# Patient Record
Sex: Male | Born: 1955 | Race: White | Hispanic: No | Marital: Married | Smoking: Former smoker
Health system: Southern US, Community
[De-identification: ages and names within clinical notes are randomized; demographics above are authoritative.]

## PROBLEM LIST (undated history)

## (undated) DIAGNOSIS — I10 Essential (primary) hypertension: Secondary | ICD-10-CM

## (undated) DIAGNOSIS — K5792 Diverticulitis of intestine, part unspecified, without perforation or abscess without bleeding: Secondary | ICD-10-CM

## (undated) DIAGNOSIS — C801 Malignant (primary) neoplasm, unspecified: Secondary | ICD-10-CM

## (undated) DIAGNOSIS — E785 Hyperlipidemia, unspecified: Secondary | ICD-10-CM

## (undated) DIAGNOSIS — Z8619 Personal history of other infectious and parasitic diseases: Secondary | ICD-10-CM

## (undated) HISTORY — PX: HERNIA REPAIR: SHX51

## (undated) HISTORY — DX: Malignant (primary) neoplasm, unspecified: C80.1

## (undated) HISTORY — DX: Diverticulitis of intestine, part unspecified, without perforation or abscess without bleeding: K57.92

## (undated) HISTORY — DX: Hyperlipidemia, unspecified: E78.5

## (undated) HISTORY — DX: Essential (primary) hypertension: I10

## (undated) HISTORY — DX: Personal history of other infectious and parasitic diseases: Z86.19

---

## 1996-07-13 HISTORY — PX: SIGMOIDOSCOPY: SUR1295

## 2000-07-13 HISTORY — PX: VASECTOMY: SHX75

## 2002-07-13 DIAGNOSIS — I10 Essential (primary) hypertension: Secondary | ICD-10-CM | POA: Insufficient documentation

## 2005-03-03 ENCOUNTER — Other Ambulatory Visit: Payer: Self-pay

## 2005-03-04 ENCOUNTER — Other Ambulatory Visit: Payer: Self-pay

## 2005-03-04 ENCOUNTER — Observation Stay: Payer: Self-pay | Admitting: Internal Medicine

## 2011-07-14 DIAGNOSIS — C801 Malignant (primary) neoplasm, unspecified: Secondary | ICD-10-CM

## 2011-07-14 HISTORY — DX: Malignant (primary) neoplasm, unspecified: C80.1

## 2012-04-15 ENCOUNTER — Ambulatory Visit: Payer: Self-pay | Admitting: Family Medicine

## 2012-04-18 ENCOUNTER — Ambulatory Visit: Payer: Self-pay | Admitting: Family Medicine

## 2012-05-11 HISTORY — PX: TRANSURETHRAL RESECTION OF BLADDER TUMOR: SHX2575

## 2013-03-08 DIAGNOSIS — N401 Enlarged prostate with lower urinary tract symptoms: Secondary | ICD-10-CM | POA: Insufficient documentation

## 2014-01-15 LAB — LIPID PANEL
CHOLESTEROL: 212 mg/dL — AB (ref 0–200)
HDL: 36 mg/dL (ref 35–70)
LDL Cholesterol: 107 mg/dL
Triglycerides: 346 mg/dL — AB (ref 40–160)

## 2014-04-17 ENCOUNTER — Ambulatory Visit: Payer: Self-pay | Admitting: Family Medicine

## 2014-04-24 LAB — BASIC METABOLIC PANEL
BUN: 10 mg/dL (ref 4–21)
Creatinine: 1.2 mg/dL (ref 0.6–1.3)
Glucose: 96 mg/dL
Potassium: 4.6 mmol/L (ref 3.4–5.3)
Sodium: 136 mmol/L — AB (ref 137–147)

## 2014-04-24 LAB — HEPATIC FUNCTION PANEL
ALT: 42 U/L — AB (ref 10–40)
AST: 34 U/L (ref 14–40)

## 2014-04-30 ENCOUNTER — Encounter: Payer: Self-pay | Admitting: General Surgery

## 2014-04-30 ENCOUNTER — Ambulatory Visit (INDEPENDENT_AMBULATORY_CARE_PROVIDER_SITE_OTHER): Payer: 59 | Admitting: General Surgery

## 2014-04-30 VITALS — BP 130/72 | HR 76 | Resp 12 | Ht 70.0 in | Wt 189.0 lb

## 2014-04-30 DIAGNOSIS — K5732 Diverticulitis of large intestine without perforation or abscess without bleeding: Secondary | ICD-10-CM

## 2014-04-30 MED ORDER — AMOXICILLIN-POT CLAVULANATE 875-125 MG PO TABS: 1.0000 | ORAL_TABLET | Freq: Two times a day (BID) | ORAL | Status: DC

## 2014-04-30 NOTE — Progress Notes (Signed)
Patient ID: Jeffrey Fernandez, male   DOB: 1955-11-08, 58 y.o.   MRN: [id number]  Chief Complaint  Patient presents with  . Other    diverticulitis    HPI Jeffrey Fernandez is a 58 y.o. male here today for an evaluation of diverticulitis. He had a CT done on 04/17/14 and two courses of antibiotics. Patient noted pain in the right side of abdomen 2 weeks ago.He states he is still having abdominal pain. The pain has decreased since completion of antibiotics. He denies any fever, chills, diarrhea, or constipation since he has finished his antibiotics last week. He is on a limited diet at this time.   HPI  Past Medical History  Diagnosis Date  . Cancer 2013    bladder    Past Surgical History  Procedure Laterality Date  . Hernia repair  [phone]    Family History  Problem Relation Age of Onset  . COPD Mother   . Diverticulosis Mother   . Diverticulosis Father   . Diabetes Father   . Diverticulosis Sister     Social History History  Substance Use Topics  . Smoking status: Former Smoker -- 35 years  . Smokeless tobacco: Not on file  . Alcohol Use: Yes    Allergies  Allergen Reactions  . Atenolol     Other reaction(s): HIVES  . Other Nausea And Vomiting    Navy Beans  . Oxycodone     Other reaction(s): HIVES  . Shellfish Allergy Itching and Swelling    Current Outpatient Prescriptions  Medication Sig Dispense Refill  . amoxicillin-clavulanate (AUGMENTIN) 875-125 MG per tablet Take 1 tablet by mouth 2 (two) times daily.  20 tablet  0  . aspirin 81 MG tablet Take 81 mg by mouth daily.      . hyoscyamine (LEVSIN SL) 0.125 MG SL tablet Take 0.125 mg by mouth every 4 (four) hours as needed.       Marland Kitchen losartan-hydrochlorothiazide (HYZAAR) 50-12.5 MG per tablet Take 1 tablet by mouth daily.       . Probiotic Product (PROBIOTIC DAILY PO) Take 1 tablet by mouth daily.       No current facility-administered medications for this visit.    Review of Systems Review of Systems   Constitutional: Negative.   Respiratory: Negative.   Cardiovascular: Negative.   Gastrointestinal: Positive for abdominal pain.    Blood pressure 130/72, pulse 76, resp. rate 12, height 5\' 10"  (1.778 m), weight 189 lb (85.73 kg).  Physical Exam Physical Exam  Constitutional: He is oriented to person, place, and time. He appears well-developed and well-nourished.  Eyes: Conjunctivae are normal. No scleral icterus.  Neck: Neck supple. No thyromegaly present.  Cardiovascular: Normal rate, regular rhythm and normal heart sounds.   No murmur heard. Pulmonary/Chest: Effort normal and breath sounds normal.  Abdominal: Soft. Normal appearance and bowel sounds are normal. There is no hepatosplenomegaly. There is no tenderness. A hernia (small umbilical hernia noted. ) is present.    Lymphadenopathy:    He has no cervical adenopathy.  Neurological: He is alert and oriented to person, place, and time.  Skin: Skin is warm and dry.    Data Reviewed  CT scan reviewed showing moderate mid sigmoid diverticulitis. Located just to the right of midline.   Assessment    Sigmoid diverticulitis although better patient still having having focal pain.     Plan    Second course of antibiotics - Augmentin 875 mg bid for 10 days. Recheck  in two weeks.     Advised on all of the above. Low residue diet.   SANKAR,SEEPLAPUTHUR G 04/30/2014, 10:36 AM

## 2014-04-30 NOTE — Patient Instructions (Signed)
Diverticulitis °Diverticulitis is inflammation or infection of small pouches in your colon that form when you have a condition called diverticulosis. The pouches in your colon are called diverticula. Your colon, or large intestine, is where water is absorbed and stool is formed. °Complications of diverticulitis can include: °· Bleeding. °· Severe infection. °· Severe pain. °· Perforation of your colon. °· Obstruction of your colon. °CAUSES  °Diverticulitis is caused by bacteria. °Diverticulitis happens when stool becomes trapped in diverticula. This allows bacteria to grow in the diverticula, which can lead to inflammation and infection. °RISK FACTORS °People with diverticulosis are at risk for diverticulitis. Eating a diet that does not include enough fiber from fruits and vegetables may make diverticulitis more likely to develop. °SYMPTOMS  °Symptoms of diverticulitis may include: °· Abdominal pain and tenderness. The pain is normally located on the left side of the abdomen, but may occur in other areas. °· Fever and chills. °· Bloating. °· Cramping. °· Nausea. °· Vomiting. °· Constipation. °· Diarrhea. °· Blood in your stool. °DIAGNOSIS  °Your health care provider will ask you about your medical history and do a physical exam. You may need to have tests done because many medical conditions can cause the same symptoms as diverticulitis. Tests may include: °· Blood tests. °· Urine tests. °· Imaging tests of the abdomen, including X-rays and CT scans. °When your condition is under control, your health care provider may recommend that you have a colonoscopy. A colonoscopy can show how severe your diverticula are and whether something else is causing your symptoms. °TREATMENT  °Most cases of diverticulitis are mild and can be treated at home. Treatment may include: °· Taking over-the-counter pain medicines. °· Following a clear liquid diet. °· Taking antibiotic medicines by mouth for 7-10 days. °More severe cases may  be treated at a hospital. Treatment may include: °· Not eating or drinking. °· Taking prescription pain medicine. °· Receiving antibiotic medicines through an IV tube. °· Receiving fluids and nutrition through an IV tube. °· Surgery. °HOME CARE INSTRUCTIONS  °· Follow your health care provider's instructions carefully. °· Follow a full liquid diet or other diet as directed by your health care provider. After your symptoms improve, your health care provider may tell you to change your diet. He or she may recommend you eat a high-fiber diet. Fruits and vegetables are good sources of fiber. Fiber makes it easier to pass stool. °· Take fiber supplements or probiotics as directed by your health care provider. °· Only take medicines as directed by your health care provider. °· Keep all your follow-up appointments. °SEEK MEDICAL CARE IF:  °· Your pain does not improve. °· You have a hard time eating food. °· Your bowel movements do not return to normal. °SEEK IMMEDIATE MEDICAL CARE IF:  °· Your pain becomes worse. °· Your symptoms do not get better. °· Your symptoms suddenly get worse. °· You have a fever. °· You have repeated vomiting. °· You have bloody or black, tarry stools. °MAKE SURE YOU:  °· Understand these instructions. °· Will watch your condition. °· Will get help right away if you are not doing well or get worse. °Document Released: 04/08/2005 Document Revised: 07/04/2013 Document Reviewed: 05/24/2013 °ExitCare® Patient Information ©2015 [employer]. This information is not intended to replace advice given to you by your health care provider. Make sure you discuss any questions you have with your health care provider. ° °Low-Fiber Diet °Fiber is found in fruits, vegetables, and whole grains. A low-fiber   diet restricts fibrous foods that are not digested in the small intestine. A diet containing about 10-15 grams of fiber per day is considered low fiber. Low-fiber diets may be used to: °· Promote healing and  rest the bowel during intestinal flare-ups. °· Prevent blockage of a partially obstructed or narrowed gastrointestinal tract. °· Reduce fecal weight and volume. °· Slow the movement of feces. °You may be on a low-fiber diet as a transitional diet following surgery, after an injury (trauma), or because of a short (acute) or lifelong (chronic) illness. Your health care provider will determine the length of time you need to stay on this diet.  °WHAT DO I NEED TO KNOW ABOUT A LOW-FIBER DIET? °Always check the fiber content on the packaging's Nutrition Facts label, especially on foods from the grains list. Ask your dietitian if you have questions about specific foods that are related to your condition, especially if the food is not listed below. In general, a low-fiber food will have less than 2 g of fiber. °WHAT FOODS CAN I EAT? °Grains °All breads and crackers made with white flour. Sweet rolls, doughnuts, waffles, pancakes, French toast, bagels. Pretzels, Melba toast, zwieback. Well-cooked cereals, such as cornmeal, farina, or cream cereals. Dry cereals that do not contain whole grains, fruit, or nuts, such as refined corn, wheat, rice, and oat cereals. Potatoes prepared any way without skins, plain pastas and noodles, refined white rice. Use white flour for baking and making sauces. Use allowed list of grains for casseroles, dumplings, and puddings.  °Vegetables °Strained tomato and vegetable juices. Fresh lettuce, cucumber, spinach. Well-cooked (no skin or pulp) or canned vegetables, such as asparagus, bean sprouts, beets, carrots, green beans, mushrooms, potatoes, pumpkin, spinach, yellow squash, tomato sauce/puree, turnips, yams, and zucchini. Keep servings limited to ½ cup.  °Fruits °All fruit juices except prune juice. Cooked or canned fruits without skin and seeds, such as applesauce, apricots, cherries, fruit cocktail, grapefruit, grapes, mandarin oranges, melons, peaches, pears, pineapple, and plums. Fresh  fruits without skin, such as apricots, avocados, bananas, melons, pineapple, nectarines, and peaches. Keep servings limited to ½ cup or 1 piece.   °Meat and Other Protein Sources °Ground or well-cooked tender beef, ham, veal, lamb, pork, or poultry. Eggs, plain cheese. Fish, oysters, shrimp, lobster, and other seafood. Liver, organ meats. Smooth nut butters. °Dairy °All milk products and alternative dairy substitutes, such as soy, rice, almond, and coconut, not containing added whole nuts, seeds, or added fruit. °Beverages °Decaf coffee, fruit, and vegetable juices or smoothies (small amounts, with no pulp or skins, and with fruits from allowed list), sports drinks, herbal tea. °Condiments °Ketchup, mustard, vinegar, cream sauce, cheese sauce, cocoa powder. Spices in moderation, such as allspice, basil, bay leaves, celery powder or leaves, cinnamon, cumin powder, curry powder, ginger, mace, marjoram, onion or garlic powder, oregano, paprika, parsley flakes, ground pepper, rosemary, sage, savory, tarragon, thyme, and turmeric. °Sweets and Desserts °Plain cakes and cookies, pie made with allowed fruit, pudding, custard, cream pie. Gelatin, fruit, ice, sherbet, frozen ice pops. Ice cream, ice milk without nuts. Plain hard candy, honey, jelly, molasses, syrup, sugar, chocolate syrup, gumdrops, marshmallows. Limit overall sugar intake.  °Fats and Oil °Margarine, butter, cream, mayonnaise, salad oils, plain salad dressings made from allowed foods. Choose healthy fats such as olive oil, canola oil, and omega-3 fatty acids (such as found in salmon or tuna) when possible.  °Other °Bouillon, broth, or cream soups made from allowed foods. Any strained soup. Casseroles or mixed dishes made with   allowed foods. °The items listed above may not be a complete list of recommended foods or beverages. Contact your dietitian for more options.  °WHAT FOODS ARE NOT RECOMMENDED? °Grains °All whole wheat and whole grain breads and crackers.  Multigrains, rye, bran seeds, nuts, or coconut. Cereals containing whole grains, multigrains, bran, coconut, nuts, raisins. Cooked or dry oatmeal, steel-cut oats. Coarse wheat cereals, granola. Cereals advertised as high fiber. Potato skins. Whole grain pasta, wild or brown rice. Popcorn. Coconut flour. Bran, buckwheat, corn bread, multigrains, rye, wheat germ.  °Vegetables °Fresh, cooked or canned vegetables, such as artichokes, asparagus, beet greens, broccoli, Brussels sprouts, cabbage, celery, cauliflower, corn, eggplant, kale, legumes or beans, okra, peas, and tomatoes. Avoid large servings of any vegetables, especially raw vegetables.  °Fruits °Fresh fruits, such as apples with or without skin, berries, cherries, figs, grapes, grapefruit, guavas, kiwis, mangoes, oranges, papayas, pears, persimmons, pineapple, and pomegranate. Prune juice and juices with pulp, stewed or dried prunes. Dried fruits, dates, raisins. Fruit seeds or skins. Avoid large servings of all fresh fruits. °Meats and Other Protein Sources °Tough, fibrous meats with gristle. Chunky nut butter. Cheese made with seeds, nuts, or other foods not recommended. Nuts, seeds, legumes (beans, including baked beans), dried peas, beans, lentils.  °Dairy °Yogurt or cheese that contains nuts, seeds, or added fruit.  °Beverages °Fruit juices with high pulp, prune juice. Caffeinated coffee and teas.  °Condiments °Coconut, maple syrup, pickles, olives. °Sweets and Desserts °Desserts, cookies, or candies that contain nuts or coconut, chunky peanut butter, dried fruits. Jams, preserves with seeds, marmalade. Large amounts of sugar and sweets. Any other dessert made with fruits from the not recommended list.  °Other °Soups made from vegetables that are not recommended or that contain other foods not recommended.  °The items listed above may not be a complete list of foods and beverages to avoid. Contact your dietitian for more information. °Document Released:  12/19/2001 Document Revised: 07/04/2013 Document Reviewed: 05/22/2013 °ExitCare® Patient Information ©2015 [employer]. This information is not intended to replace advice given to you by your health care provider. Make sure you discuss any questions you have with your health care provider. ° °

## 2014-05-02 ENCOUNTER — Ambulatory Visit: Payer: Self-pay | Admitting: General Surgery

## 2014-05-15 ENCOUNTER — Other Ambulatory Visit: Payer: Self-pay

## 2014-05-15 ENCOUNTER — Encounter: Payer: Self-pay | Admitting: General Surgery

## 2014-05-15 ENCOUNTER — Ambulatory Visit (INDEPENDENT_AMBULATORY_CARE_PROVIDER_SITE_OTHER): Payer: BC Managed Care – PPO | Admitting: General Surgery

## 2014-05-15 VITALS — BP 120/70 | HR 74 | Resp 14 | Ht 70.0 in | Wt 191.0 lb

## 2014-05-15 DIAGNOSIS — K5732 Diverticulitis of large intestine without perforation or abscess without bleeding: Secondary | ICD-10-CM

## 2014-05-15 MED ORDER — POLYETHYLENE GLYCOL 3350 17 GM/SCOOP PO POWD: 1.0000 | Freq: Once | ORAL | Status: DC

## 2014-05-15 NOTE — Progress Notes (Signed)
Patient ID: Jeffrey Fernandez, male   DOB: [birth year], 58 y.o.   MRN: [id number]  Chief Complaint  Patient presents with  . Follow-up    2 week follow up sigmoid diverticulitis    HPI Jeffrey Fernandez is a 58 y.o. male who presents for a 2 week follow up sigmoid diverticulitis. He states he had blood in his stool on 05/06/14. He is unsure if this is due to hemorrhoids. He has not seen blood since this last episode. He states that he had an episode of of abdominal pain after pushing his umbilical hernia back in.   HPI  Past Medical History  Diagnosis Date  . Cancer 2013    bladder    Past Surgical History  Procedure Laterality Date  . Hernia repair  [phone]    Family History  Problem Relation Age of Onset  . COPD Mother   . Diverticulosis Mother   . Diverticulosis Father   . Diabetes Father   . Diverticulosis Sister     Social History History  Substance Use Topics  . Smoking status: Former Smoker -- 35 years  . Smokeless tobacco: Not on file  . Alcohol Use: 0.0 oz/week    0 Not specified per week    Allergies  Allergen Reactions  . Atenolol     Other reaction(s): HIVES  . Other Nausea And Vomiting    Navy Beans  . Oxycodone     Other reaction(s): HIVES  . Shellfish Allergy Itching and Swelling    Current Outpatient Prescriptions  Medication Sig Dispense Refill  . aspirin 81 MG tablet Take 81 mg by mouth daily.    . hyoscyamine (LEVSIN SL) 0.125 MG SL tablet Take 0.125 mg by mouth every 4 (four) hours as needed.     Marland Kitchen losartan-hydrochlorothiazide (HYZAAR) 50-12.5 MG per tablet Take 1 tablet by mouth daily.     . Probiotic Product (PROBIOTIC DAILY PO) Take 1 tablet by mouth daily.     No current facility-administered medications for this visit.    Review of Systems Review of Systems  Constitutional: Negative.   Respiratory: Negative.   Cardiovascular: Negative.   Gastrointestinal: Negative.     Blood pressure 120/70, pulse 74, resp. rate 14, height 5'  10" (1.778 m), weight 191 lb (86.637 kg).  Physical Exam Physical Exam  Constitutional: He is oriented to person, place, and time. He appears well-developed and well-nourished.  Neck: Neck supple. No thyromegaly present.  Cardiovascular: Normal rate, regular rhythm and normal heart sounds.   No murmur heard. Pulmonary/Chest: Effort normal and breath sounds normal.  Abdominal: Soft. Normal appearance and bowel sounds are normal. There is no hepatosplenomegaly. There is tenderness (mild tenderness that is markedly improved since last visit. ) in the right lower quadrant. A hernia (1cm umbilical) is present.  Neurological: He is alert and oriented to person, place, and time.  Skin: Skin is warm and dry.    Data Reviewed    Assessment    Sigmoid diverticulitis, much improved. Pt has not had screening colonoscopy.   Pt advised fully on this and he is agreeable.    Plan    Patient to be scheduled for colonoscopy in 3 weeks.        Leslieann Whisman G 05/15/2014, 10:52 AM

## 2014-05-15 NOTE — Progress Notes (Signed)
The patient has been scheduled for a Colonoscopy at [facility] on 06/20/14. He is aware to pre register with the hospital at least 2 days prior. He will only take his Blood Pressure medication at 6 am with a small sip of water the morning of.

## 2014-05-15 NOTE — Patient Instructions (Signed)
The patient is aware to call back for any questions or concerns. Colonoscopy A colonoscopy is an exam to look at the entire large intestine (colon). This exam can help find problems such as tumors, polyps, inflammation, and areas of bleeding. The exam takes about 1 hour.  LET YOUR HEALTH CARE PROVIDER KNOW ABOUT:   Any allergies you have.  All medicines you are taking, including vitamins, herbs, eye drops, creams, and over-the-counter medicines.  Previous problems you or members of your family have had with the use of anesthetics.  Any blood disorders you have.  Previous surgeries you have had.  Medical conditions you have. RISKS AND COMPLICATIONS  Generally, this is a safe procedure. However, as with any procedure, complications can occur. Possible complications include:  Bleeding.  Tearing or rupture of the colon wall.  Reaction to medicines given during the exam.  Infection (rare). BEFORE THE PROCEDURE   Ask your health care provider about changing or stopping your regular medicines.  You may be prescribed an oral bowel prep. This involves drinking a large amount of medicated liquid, starting the day before your procedure. The liquid will cause you to have multiple loose stools until your stool is almost clear or light green. This cleans out your colon in preparation for the procedure.  Do not eat or drink anything else once you have started the bowel prep, unless your health care provider tells you it is safe to do so.  Arrange for someone to drive you home after the procedure. PROCEDURE   You will be given medicine to help you relax (sedative).  You will lie on your side with your knees bent.  A long, flexible tube with a light and camera on the end (colonoscope) will be inserted through the rectum and into the colon. The camera sends video back to a computer screen as it moves through the colon. The colonoscope also releases carbon dioxide gas to inflate the colon. This  helps your health care provider see the area better.  During the exam, your health care provider may take a small tissue sample (biopsy) to be examined under a microscope if any abnormalities are found.  The exam is finished when the entire colon has been viewed. AFTER THE PROCEDURE   Do not drive for 24 hours after the exam.  You may have a small amount of blood in your stool.  You may pass moderate amounts of gas and have mild abdominal cramping or bloating. This is caused by the gas used to inflate your colon during the exam.  Ask when your test results will be ready and how you will get your results. Make sure you get your test results. Document Released: 06/26/2000 Document Revised: 04/19/2013 Document Reviewed: 03/06/2013 ExitCare Patient Information 2015 [employer]. This information is not intended to replace advice given to you by your health care provider. Make sure you discuss any questions you have with your health care provider.  

## 2014-06-11 ENCOUNTER — Other Ambulatory Visit: Payer: Self-pay | Admitting: General Surgery

## 2014-06-11 DIAGNOSIS — Z1211 Encounter for screening for malignant neoplasm of colon: Secondary | ICD-10-CM

## 2014-06-11 DIAGNOSIS — K5732 Diverticulitis of large intestine without perforation or abscess without bleeding: Secondary | ICD-10-CM

## 2014-06-20 ENCOUNTER — Encounter: Payer: Self-pay | Admitting: General Surgery

## 2014-06-20 ENCOUNTER — Ambulatory Visit: Payer: Self-pay | Admitting: General Surgery

## 2014-06-20 DIAGNOSIS — Z1211 Encounter for screening for malignant neoplasm of colon: Secondary | ICD-10-CM

## 2014-06-20 DIAGNOSIS — K644 Residual hemorrhoidal skin tags: Secondary | ICD-10-CM

## 2014-06-20 DIAGNOSIS — K5730 Diverticulosis of large intestine without perforation or abscess without bleeding: Secondary | ICD-10-CM

## 2014-06-20 DIAGNOSIS — D125 Benign neoplasm of sigmoid colon: Secondary | ICD-10-CM

## 2014-06-20 LAB — HM COLONOSCOPY

## 2014-06-21 ENCOUNTER — Encounter: Payer: Self-pay | Admitting: General Surgery

## 2014-06-25 ENCOUNTER — Telehealth: Payer: Self-pay | Admitting: *Deleted

## 2014-06-25 NOTE — Telephone Encounter (Signed)
-----   Message from Christene Lye, MD sent at 06/22/2014  8:17 AM EST ----- Please let pt pt know the pathology was normal. athe polyp was not a true polyp. He does not need colonoscopy for 50yrs. He is to call if he he has any recurrence of abdominal pain.

## 2014-06-25 NOTE — Telephone Encounter (Signed)
Error

## 2014-06-25 NOTE — Telephone Encounter (Signed)
Notified patient as instructed, patient pleased. Discussed follow-up appointments, patient agrees  

## 2014-11-05 LAB — SURGICAL PATHOLOGY

## 2015-04-30 ENCOUNTER — Other Ambulatory Visit: Payer: Self-pay | Admitting: Family Medicine

## 2015-11-06 ENCOUNTER — Other Ambulatory Visit: Payer: Self-pay | Admitting: Family Medicine

## 2015-12-26 ENCOUNTER — Other Ambulatory Visit: Payer: Self-pay | Admitting: Family Medicine

## 2016-01-23 ENCOUNTER — Other Ambulatory Visit: Payer: Self-pay | Admitting: Family Medicine

## 2016-02-20 ENCOUNTER — Other Ambulatory Visit: Payer: Self-pay | Admitting: Family Medicine

## 2016-03-19 DIAGNOSIS — Z8551 Personal history of malignant neoplasm of bladder: Secondary | ICD-10-CM | POA: Insufficient documentation

## 2016-03-19 DIAGNOSIS — E785 Hyperlipidemia, unspecified: Secondary | ICD-10-CM | POA: Insufficient documentation

## 2016-03-19 DIAGNOSIS — K5732 Diverticulitis of large intestine without perforation or abscess without bleeding: Secondary | ICD-10-CM | POA: Insufficient documentation

## 2016-03-19 DIAGNOSIS — M7120 Synovial cyst of popliteal space [Baker], unspecified knee: Secondary | ICD-10-CM | POA: Insufficient documentation

## 2016-03-20 ENCOUNTER — Encounter: Payer: Self-pay | Admitting: Family Medicine

## 2016-03-20 ENCOUNTER — Ambulatory Visit (INDEPENDENT_AMBULATORY_CARE_PROVIDER_SITE_OTHER): Payer: BLUE CROSS/BLUE SHIELD | Admitting: Family Medicine

## 2016-03-20 VITALS — BP 110/68 | HR 65 | Temp 97.9°F | Resp 16 | Ht 70.0 in | Wt 194.0 lb

## 2016-03-20 DIAGNOSIS — I10 Essential (primary) hypertension: Secondary | ICD-10-CM

## 2016-03-20 DIAGNOSIS — Z87891 Personal history of nicotine dependence: Secondary | ICD-10-CM

## 2016-03-20 DIAGNOSIS — Z125 Encounter for screening for malignant neoplasm of prostate: Secondary | ICD-10-CM

## 2016-03-20 NOTE — Patient Instructions (Signed)
Screening for lung cancer is recommended for people between 55 and 58 years of age who have smoked at 1 pack per day or more for at least 30 years. Please call our office at [phone] to schedule a low dose CT lung scan for lung cancer screening.   

## 2016-03-20 NOTE — Progress Notes (Signed)
Patient: Jeffrey Fernandez Male    DOB: [birth year]   60 y.o.   MRN: [id number] Visit Date: 03/20/2016  Today's Provider: Lelon Huh, MD   Chief Complaint  Patient presents with  . Follow-up  . Hypertension   Subjective:    HPI     Hypertension, follow-up:  BP Readings from Last 3 Encounters:  03/20/16 110/68  05/15/14 120/70  04/30/14 130/72    He was last seen for hypertension 01/15/2014.  BP at that visit was 104/66. Management since that visit includes; no changes, continue current medications.Marland KitchenHe reports good compliance with treatment. He is not having side effects. none He is exercising. He is adherent to low salt diet.   Outside blood pressures are normal. He is experiencing none.  Patient denies none.   Cardiovascular risk factors include none.  Use of agents associated with hypertension: none.   ----------------------------------------------------------------    Allergies  Allergen Reactions  . Atenolol Diarrhea    Other reaction(s): HIVES  . Other Nausea And Vomiting    Navy Beans  . Oxycodone     Other reaction(s): HIVES  . Shellfish Allergy Itching and Swelling     Current Outpatient Prescriptions:  .  aspirin 81 MG tablet, Take 81 mg by mouth daily., Disp: , Rfl:  .  losartan-hydrochlorothiazide (HYZAAR) 50-12.5 MG tablet, TAKE 1 TABLET BY MOUTH DAILY. NEED APPOINTMENT FOR REFILLS, Disp: 20 tablet, Rfl: 0  Review of Systems  Constitutional: Negative for appetite change, chills and fever.  Respiratory: Negative for chest tightness, shortness of breath and wheezing.   Cardiovascular: Negative for chest pain and palpitations.  Gastrointestinal: Negative for abdominal pain, nausea and vomiting.    Social History  Substance Use Topics  . Smoking status: Former Smoker    Years: 35.00    Quit date: 07/14/2011  . Smokeless tobacco: Not on file  . Alcohol use 0.0 oz/week     Comment: drinks 2-3 beers daily   Objective:   BP 110/68 (BP  Location: Right Arm, Patient Position: Sitting, Cuff Size: Normal)   Pulse 65   Temp 97.9 F (36.6 C) (Oral)   Resp 16   Ht 5\' 10"  (1.778 m)   Wt 194 lb (88 kg)   SpO2 97%   BMI 27.84 kg/m    Depression screen [facility] 2/9 03/20/2016  Decreased Interest 0  Down, Depressed, Hopeless 0  PHQ - 2 Score 0  Altered sleeping 1  Tired, decreased energy 1  Change in appetite 0  Feeling bad or failure about yourself  0  Trouble concentrating 0  Moving slowly or fidgety/restless 0  Suicidal thoughts 0  PHQ-9 Score 2  Difficult doing work/chores Not difficult at all     Physical Exam   General Appearance:    Alert, cooperative, no distress  Eyes:    PERRL, conjunctiva/corneas clear, EOM's intact       Lungs:     Clear to auscultation bilaterally, respirations unlabored  Heart:    Regular rate and rhythm  Neurologic:   Awake, alert, oriented x 3. No apparent focal neurological           defect.            Assessment & Plan:     1. Essential (primary) hypertension Well controlled.  Continue current medications.   - Lipid panel - Comprehensive metabolic panel  2. Prostate cancer screening  - PSA  3. Stopped smoking with greater than 30 pack year history  Recommended LDCT. He will think about it and call back if he decides to proceed.        Lelon Huh, MD  [facility]

## 2016-03-21 LAB — LIPID PANEL
CHOLESTEROL TOTAL: 177 mg/dL (ref 100–199)
Chol/HDL Ratio: 4.3 ratio units (ref 0.0–5.0)
HDL: 41 mg/dL (ref >39)
LDL Calculated: 119 mg/dL — ABNORMAL HIGH (ref 0–99)
TRIGLYCERIDES: 86 mg/dL (ref 0–149)
VLDL Cholesterol Cal: 17 mg/dL (ref 5–40)

## 2016-03-21 LAB — COMPREHENSIVE METABOLIC PANEL
A/G RATIO: 1.6 (ref 1.2–2.2)
ALT: 35 IU/L (ref 0–44)
AST: 28 IU/L (ref 0–40)
Albumin: 4.5 g/dL (ref 3.5–5.5)
Alkaline Phosphatase: 98 IU/L (ref 39–117)
BUN/Creatinine Ratio: 13 (ref 9–20)
BUN: 16 mg/dL (ref 6–24)
Bilirubin Total: 0.8 mg/dL (ref 0.0–1.2)
CALCIUM: 10.1 mg/dL (ref 8.7–10.2)
CO2: 25 mmol/L (ref 18–29)
Chloride: 99 mmol/L (ref 96–106)
Creatinine, Ser: 1.19 mg/dL (ref 0.76–1.27)
GFR, EST AFRICAN AMERICAN: 77 mL/min/{1.73_m2} (ref >59)
GFR, EST NON AFRICAN AMERICAN: 67 mL/min/{1.73_m2} (ref >59)
GLOBULIN, TOTAL: 2.9 g/dL (ref 1.5–4.5)
Glucose: 93 mg/dL (ref 65–99)
POTASSIUM: 4.9 mmol/L (ref 3.5–5.2)
SODIUM: 138 mmol/L (ref 134–144)
TOTAL PROTEIN: 7.4 g/dL (ref 6.0–8.5)

## 2016-03-21 LAB — PSA: Prostate Specific Ag, Serum: 1 ng/mL (ref 0.0–4.0)

## 2016-03-25 ENCOUNTER — Other Ambulatory Visit: Payer: Self-pay | Admitting: Family Medicine

## 2016-07-01 ENCOUNTER — Ambulatory Visit (INDEPENDENT_AMBULATORY_CARE_PROVIDER_SITE_OTHER): Payer: BLUE CROSS/BLUE SHIELD | Admitting: Physician Assistant

## 2016-07-01 ENCOUNTER — Encounter: Payer: Self-pay | Admitting: Physician Assistant

## 2016-07-01 VITALS — BP 122/76 | HR 76 | Temp 98.6°F | Resp 16 | Wt 202.0 lb

## 2016-07-01 DIAGNOSIS — J019 Acute sinusitis, unspecified: Secondary | ICD-10-CM | POA: Diagnosis not present

## 2016-07-01 MED ORDER — AMOXICILLIN-POT CLAVULANATE 875-125 MG PO TABS: 1.0000 | ORAL_TABLET | Freq: Two times a day (BID) | ORAL | 0 refills | Status: AC

## 2016-07-01 MED ORDER — BENZONATATE 100 MG PO CAPS: 100.0000 mg | ORAL_CAPSULE | Freq: Three times a day (TID) | ORAL | 0 refills | Status: AC | PRN

## 2016-07-01 NOTE — Progress Notes (Signed)
[place]  Chief Complaint  Patient presents with  . URI    Started about two weeks ago.  . Sinusitis    Subjective:    Patient ID: Jeffrey Fernandez, male    DOB: 12-22-1955, 60 y.o.   MRN: [id number]  Upper Respiratory Infection: Jeffrey Fernandez is a 61 y.o. male with a past medical history significant for Hypertension, former smoking complaining of symptoms of a URI, possible sinusitis. Symptoms include congestion, cough and sore throat. Onset of symptoms was 2 weeks ago, unchanged since that time. He also c/o congestion, cough described as productive, nasal congestion and post nasal drip for the past 1 week .  He is drinking plenty of fluids. Evaluation to date: none. Treatment to date: antihistamines and decongestants. The treatment has provided minimal relief. Thought he was getting better, then got worse.  Review of Systems  Constitutional: Negative for activity change, appetite change, chills, diaphoresis, fatigue, fever and unexpected weight change.  HENT: Positive for congestion, nosebleeds, postnasal drip, rhinorrhea, sinus pain, sinus pressure and sore throat. Negative for ear discharge, ear pain, tinnitus and trouble swallowing.   Eyes: Positive for discharge and itching.  Respiratory: Positive for cough and shortness of breath. Negative for apnea, choking, chest tightness, wheezing and stridor.   Gastrointestinal: Negative.   Neurological: Positive for headaches. Negative for dizziness and light-headedness.       Objective:   BP 122/76 (BP Location: Left Arm, Patient Position: Sitting, Cuff Size: Normal)   Pulse 76   Temp 98.6 F (37 C) (Oral)   Resp 16   Wt 202 lb (91.6 kg)   BMI 28.98 kg/m   Patient Active Problem List   Diagnosis Date Noted  . Stopped smoking with greater than 30 pack year history 03/20/2016  . H/O primary malignant neoplasm of urinary bladder 03/19/2016  . Hyperlipidemia 03/19/2016  . Diverticulitis of  sigmoid colon 03/19/2016  . Synovial cyst of popliteal space 03/19/2016  . 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 Essential (primary) hypertension 07/13/2002    Outpatient Encounter Prescriptions as of 07/01/2016  Medication Sig  . aspirin 81 MG tablet Take 81 mg by mouth daily.  Marland Kitchen losartan-hydrochlorothiazide (HYZAAR) 50-12.5 MG tablet TAKE 1 TABLET BY MOUTH DAILY  . amoxicillin-clavulanate (AUGMENTIN) 875-125 MG tablet Take 1 tablet by mouth 2 (two) times daily.  . benzonatate (TESSALON) 100 MG capsule Take 1 capsule (100 mg total) by mouth 3 (three) times daily as needed for cough.   No facility-administered encounter medications on file as of 07/01/2016.     Allergies  Allergen Reactions  . Atenolol Diarrhea    Other reaction(s): HIVES  . Other Nausea And Vomiting    Navy Beans  . Oxycodone     Other reaction(s): HIVES  . Shellfish Allergy Itching and Swelling       Physical Exam  Constitutional: He is oriented to person, place, and time. He appears well-developed and well-nourished. No distress.  HENT:  Right Ear: External ear normal.  Left Ear: External ear normal.  Nose: Right sinus exhibits maxillary sinus tenderness and frontal sinus tenderness. Left sinus exhibits maxillary sinus tenderness and frontal sinus tenderness.  Mouth/Throat: Oropharynx is clear and moist and mucous membranes are normal. No oropharyngeal exudate, posterior oropharyngeal edema, posterior oropharyngeal erythema or tonsillar abscesses.  Eyes: Conjunctivae are normal.  Neck: Normal range of motion.  Cardiovascular: Normal rate and regular rhythm.   Pulmonary/Chest: Effort normal and breath sounds normal.  Lymphadenopathy:    He has no cervical adenopathy.  Neurological: He is alert and oriented to person, place, and time.  Skin: Skin is warm and dry. He is not diaphoretic.  Psychiatric: He has a normal mood and affect. His behavior is normal.       Assessment & Plan:    Problem List Items Addressed This Visit    None    Visit Diagnoses    Acute non-recurrent sinusitis, unspecified location    -  Primary   Relevant Medications   amoxicillin-clavulanate (AUGMENTIN) 875-125 MG tablet   benzonatate (TESSALON) 100 MG capsule      1. Acute non-recurrent sinusitis, unspecified location - amoxicillin-clavulanate (AUGMENTIN) 875-125 MG tablet; Take 1 tablet by mouth 2 (two) times daily.  Dispense: 20 tablet; Refill: 0 - benzonatate (TESSALON) 100 MG capsule; Take 1 capsule (100 mg total) by mouth 3 (three) times daily as needed for cough.  Dispense: 20 capsule; Refill: 0  Return if symptoms worsen or fail to improve.   Patient Instructions  Sinusitis, Adult Sinusitis is soreness and inflammation of your sinuses. Sinuses are hollow spaces in the bones around your face. They are located:  Around your eyes.  In the middle of your forehead.  Behind your nose.  In your cheekbones. Your sinuses and nasal passages are lined with a stringy fluid (mucus). Mucus normally drains out of your sinuses. When your nasal tissues get inflamed or swollen, the mucus can get trapped or blocked so air cannot flow through your sinuses. This lets bacteria, viruses, and funguses grow, and that leads to infection. Follow these instructions at home: Medicines  Take, use, or apply over-the-counter and prescription medicines only as told by your doctor. These may include nasal sprays.  If you were prescribed an antibiotic medicine, take it as told by your doctor. Do not stop taking the antibiotic even if you start to feel better. Hydrate and Humidify  Drink enough water to keep your pee (urine) clear or pale yellow.  Use a cool mist humidifier to keep the humidity level in your home above 50%.  Breathe in steam for 10-15 minutes, 3-4 times a day or as told by your doctor. You can do this in the bathroom while a hot shower is running.  Try not to spend time in cool or dry  air. Rest  Rest as much as possible.  Sleep with your head raised (elevated).  Make sure to get enough sleep each night. General instructions  Put a warm, moist washcloth on your face 3-4 times a day or as told by your doctor. This will help with discomfort.  Wash your hands often with soap and water. If there is no soap and water, use hand sanitizer.  Do not smoke. Avoid being around people who are smoking (secondhand smoke).  Keep all follow-up visits as told by your doctor. This is important. Contact a doctor if:  You have a fever.  Your symptoms get worse.  Your symptoms do not get better within 10 days. Get help right away if:  You have a very bad headache.  You cannot stop throwing up (vomiting).  You have pain or swelling around your face or eyes.  You have trouble seeing.  You feel confused.  Your neck is stiff.  You have trouble breathing. This information is not intended to replace advice given to you by your health care provider. Make sure you discuss any questions you have with your health care provider. Document Released: 12/16/2007  Document Revised: 02/23/2016 Document Reviewed: 04/24/2015 Elsevier Interactive Patient Education  [employer].     The entirety of the information documented in the History of Present Illness, Review of Systems and Physical Exam were personally obtained by me. Portions of this information were initially documented by Ashley Royalty, CMA and reviewed by me for thoroughness and accuracy.

## 2016-07-01 NOTE — Patient Instructions (Signed)

## 2017-03-03 ENCOUNTER — Encounter: Payer: Self-pay | Admitting: Family Medicine

## 2017-03-03 ENCOUNTER — Ambulatory Visit (INDEPENDENT_AMBULATORY_CARE_PROVIDER_SITE_OTHER): Payer: BLUE CROSS/BLUE SHIELD | Admitting: Family Medicine

## 2017-03-03 VITALS — BP 128/72 | HR 80 | Temp 98.2°F | Resp 16 | Wt 198.4 lb

## 2017-03-03 DIAGNOSIS — I10 Essential (primary) hypertension: Secondary | ICD-10-CM | POA: Diagnosis not present

## 2017-03-03 DIAGNOSIS — M79601 Pain in right arm: Secondary | ICD-10-CM

## 2017-03-03 DIAGNOSIS — R29898 Other symptoms and signs involving the musculoskeletal system: Secondary | ICD-10-CM

## 2017-03-03 DIAGNOSIS — G8929 Other chronic pain: Secondary | ICD-10-CM

## 2017-03-03 NOTE — Progress Notes (Signed)
Patient: Jeffrey Fernandez Male    DOB: 09/06/1955   61 y.o.   MRN: [id number] Visit Date: 03/03/2017  Today's Provider: Lelon Huh, MD   Chief Complaint  Patient presents with  . Hypertension    follow up. Pt needs a refill on Hyzaar   . Elbow Pain   Subjective:    Arm Pain   Incident onset: March-April 2018. 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 He states he felt his elbow pop at that time. The pain is present in the right elbow. The quality of the pain is described as shooting, aching and stabbing. The pain has been worsening since the incident. The symptoms are aggravated by movement and lifting. He has tried acetaminophen (and wrist brace) for the symptoms. The treatment provided no relief.   Was bothering him some even before injury in March but has been much worse.  Has been wearing brace around elbow. Has tried heat and ice without improvement. He states it has actually been getting worse the last couple of weeks and having trouble holding objects and pulling with his right arm which is interfering with work    Hypertension, follow-up:  BP Readings from Last 3 Encounters:  03/03/17 128/72  07/01/16 122/76  03/20/16 110/68    He was last seen for hypertension on 03/20/2016 BP at that visit was 110/68. Management changes since that visit include continue medication. He reports good compliance with treatment. He is not having side effects.  He is not exercising. He does work. He is adherent to low salt diet. He states he does not add salt to food. Outside blood pressures have not been checked recently. He is experiencing none.  Patient denies chest pain, chest pressure/discomfort, irregular heart beat and palpitations.   Cardiovascular risk factors include advanced age (older than 71 for men, 13 for women), hypertension and male gender.  Use of agents associated with hypertension: none.     Weight trend: stable Wt Readings from Last 3 Encounters:  03/03/17 198 lb  6.4 oz (90 kg)  07/01/16 202 lb (91.6 kg)  03/20/16 194 lb (88 kg)     ------------------------------------------------------------------------    Previous Medications   ASPIRIN 81 MG TABLET    Take 81 mg by mouth daily.   LOSARTAN-HYDROCHLOROTHIAZIDE (HYZAAR) 50-12.5 MG TABLET    TAKE 1 TABLET BY MOUTH DAILY    Review of Systems  Constitutional: Negative.   Respiratory: Negative.   Cardiovascular: Negative.   Musculoskeletal: Positive for arthralgias.    Social History  Substance Use Topics  . Smoking status: Former Smoker    Years: 35.00    Quit date: 07/14/2011  . Smokeless tobacco: Never Used  . Alcohol use 0.0 oz/week     Comment: drinks 2-3 beers daily   Objective:   BP 128/72 (BP Location: Right Arm, Patient Position: Sitting, Cuff Size: Normal)   Pulse 80   Temp 98.2 F (36.8 C) (Oral)   Resp 16   Wt 198 lb 6.4 oz (90 kg)   SpO2 96%   BMI 28.47 kg/m   Physical Exam  General appearance: alert, well developed, well nourished, cooperative and in no distress Head: Normocephalic, without obvious abnormality, atraumatic Respiratory: Respirations even and unlabored, normal respiratory rate Extremities: Tender along volar aspect of right proximal  Forearm. Pain with elbow flexion, pronation and grip. +4 strength of right forearm. Swollen along length of forearm musculature.      Assessment & Plan:     1. Chronic pain of  right upper extremity  - Ambulatory referral to Orthopedic Surgery  2. Right arm weakness  - Ambulatory referral to Orthopedic Surgery  3. Essential (primary) hypertension Well controlled.  Continue current medications.    Follow up: No Follow-up on file.

## 2017-04-18 ENCOUNTER — Other Ambulatory Visit: Payer: Self-pay | Admitting: Family Medicine

## 2017-08-02 ENCOUNTER — Other Ambulatory Visit: Payer: Self-pay | Admitting: Family Medicine

## 2017-09-06 ENCOUNTER — Ambulatory Visit: Payer: 59 | Admitting: Family Medicine

## 2017-09-06 ENCOUNTER — Encounter: Payer: Self-pay | Admitting: Family Medicine

## 2017-09-06 VITALS — BP 102/64 | HR 87 | Temp 98.8°F | Resp 18 | Wt 201.0 lb

## 2017-09-06 DIAGNOSIS — R05 Cough: Secondary | ICD-10-CM

## 2017-09-06 DIAGNOSIS — J40 Bronchitis, not specified as acute or chronic: Secondary | ICD-10-CM | POA: Diagnosis not present

## 2017-09-06 DIAGNOSIS — R059 Cough, unspecified: Secondary | ICD-10-CM

## 2017-09-06 MED ORDER — LEVOFLOXACIN 500 MG PO TABS: 500.0000 mg | ORAL_TABLET | Freq: Every day | ORAL | 0 refills | Status: DC

## 2017-09-06 MED ORDER — LOSARTAN POTASSIUM-HCTZ 50-12.5 MG PO TABS: 1.0000 | ORAL_TABLET | Freq: Every day | ORAL | 0 refills | Status: DC

## 2017-09-06 MED ORDER — HYDROCODONE-HOMATROPINE 5-1.5 MG/5ML PO SYRP: 5.0000 mL | ORAL_SOLUTION | Freq: Three times a day (TID) | ORAL | 0 refills | Status: DC | PRN

## 2017-09-06 NOTE — Progress Notes (Signed)
Patient: Jeffrey Fernandez Male    DOB: 1955/12/13   62 y.o.   MRN: [id number] Visit Date: 09/06/2017  Today's Provider: Lelon Huh, MD   Chief Complaint  Patient presents with  . Cough    x 2 weeks   Subjective:    Cough  This is a new problem. Episode onset: 2 weeks ago. The problem has been gradually worsening. The cough is productive of sputum (green colored sputum). Associated symptoms include chest pain (when coughing), chills, a fever (low grade  100.1), headaches, heartburn, myalgias, postnasal drip, rhinorrhea, a sore throat, shortness of breath and wheezing. Pertinent negatives include no ear congestion or ear pain. The symptoms are aggravated by lying down. 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 The treatment provided no relief.       Allergies  Allergen Reactions  . Atenolol Diarrhea    Other reaction(s): HIVES  . Other Nausea And Vomiting    Navy Beans  . Oxycodone     Other reaction(s): HIVES  . Shellfish Allergy Itching and Swelling     Current Outpatient Medications:  .  aspirin 81 MG tablet, Take 81 mg by mouth daily., Disp: , Rfl:  .  losartan-hydrochlorothiazide (HYZAAR) 50-12.5 MG tablet, TAKE 1 TABLET BY MOUTH EVERY DAY, Disp: 90 tablet, Rfl: 0  Review of Systems  Constitutional: Positive for appetite change (decrease in appetite), chills, fatigue and fever (low grade  100.1).  HENT: Positive for postnasal drip, rhinorrhea and sore throat. Negative for ear pain.   Eyes: Positive for pain and discharge (this morning eyes matted shut).       Puffy eyes this morning  Respiratory: Positive for cough, shortness of breath and wheezing.   Cardiovascular: Positive for chest pain (when coughing).  Gastrointestinal: Positive for abdominal pain (during coughing spells) and heartburn.       Reflux  Musculoskeletal: Positive for myalgias.  Neurological: Positive for headaches.  Psychiatric/Behavioral: Positive for sleep disturbance (cough keeping patient from  sleeping).    Social History   Tobacco Use  . Smoking status: Former Smoker    Years: 35.00    Last attempt to quit: 07/14/2011    Years since quitting: 6.1  . Smokeless tobacco: Never Used  Substance Use Topics  . Alcohol use: Yes    Alcohol/week: 0.0 oz    Comment: drinks 2-3 beers daily   Objective:   BP 102/64 (BP Location: Left Arm, Patient Position: Sitting, Cuff Size: Large)   Pulse 87   Temp 98.8 F (37.1 C) (Oral)   Resp 18   Wt 201 lb (91.2 kg)   SpO2 96% Comment: room air  BMI 28.84 kg/m     Physical Exam  General Appearance:    Alert, cooperative, no distress  HENT:   bilateral TM normal without fluid or infection, neck without nodes, pharynx erythematous without exudate, sinuses nontender, post nasal drip noted and nasal mucosa congested  Eyes:    PERRL, conjunctiva/corneas clear, EOM's intact       Lungs:     Clear to auscultation bilaterally, respirations unlabored  Heart:    Regular rate and rhythm  Neurologic:   Awake, alert, oriented x 3. No apparent focal neurological           defect.           Assessment & Plan:     1. Bronchitis  - levofloxacin (LEVAQUIN) 500 MG tablet; Take 1 tablet (500 mg total) by mouth daily.  Dispense: 7  tablet; Refill: 0  2. Cough  - HYDROcodone-homatropine (HYCODAN) 5-1.5 MG/5ML syrup; Take 5 mLs by mouth every 8 (eight) hours as needed.  Dispense: 100 mL; Refill: 0  Call if symptoms change or if not rapidly improving.           Lelon Huh, MD  [facility]

## 2017-09-06 NOTE — Patient Instructions (Signed)
Bronchospasm, Adult Bronchospasm is a tightening of the airways going into the lungs. During an episode, it may be harder to breathe. You may cough, and you may make a whistling sound when you breathe (wheeze). This condition often affects people with asthma. What are the causes? This condition is caused by swelling and irritation in the airways. It can be triggered by:  An infection (common).  Seasonal allergies.  An allergic reaction.  Exercise.  Irritants. These include pollution, cigarette smoke, strong odors, aerosol sprays, and paint fumes.  Weather changes. Winds increase molds and pollens in the air. Cold air may cause swelling.  Stress and emotional upset.  What are the signs or symptoms? Symptoms of this condition include:  Wheezing. If the episode was triggered by an allergy, wheezing may start right away or hours later.  Nighttime coughing.  Frequent or severe coughing with a simple cold.  Chest tightness.  Shortness of breath.  Decreased ability to exercise.  How is this diagnosed? This condition is usually diagnosed with a review of your medical history and a physical exam. Tests, such as lung function tests, are sometimes done to look for other conditions. The need for a chest X-ray depends on where the wheezing occurs and whether it is the first time you have wheezed. How is this treated? This condition may be treated with:  Inhaled medicines. These open up the airways and help you breathe. They can be taken with an inhaler or a nebulizer device.  Corticosteroid medicines. These may be given for severe bronchospasm, usually when it is associated with asthma.  Avoiding triggers, such as irritants, infection, or allergies.  Follow these instructions at home: Medicines  Take over-the-counter and prescription medicines only as told by your health care provider.  If you need to use an inhaler or nebulizer to take your medicine, ask your health care  provider to explain how to use it correctly. If you were given a spacer, always use it with your inhaler. Lifestyle  Reduce the number of triggers in your home. To do this: ? Change your heating and air conditioning filter at least once a month. ? Limit your use of fireplaces and wood stoves. ? Do not smoke. Do not allow smoking in your home. ? Avoid using perfumes and fragrances. ? Get rid of pests, such as roaches and mice, and their droppings. ? Remove any mold from your home. ? Keep your house clean and dust free. Use unscented cleaning products. ? Replace carpet with wood, tile, or vinyl flooring. Carpet can trap dander and dust. ? Use allergy-proof pillows, mattress covers, and box spring covers. ? Wash bed sheets and blankets every week in hot water. Dry them in a dryer. ? Use blankets that are made of polyester or cotton. ? Wash your hands often. ? Do not allow pets in your bedroom.  Avoid breathing in cold air when you exercise. General instructions  Have a plan for seeking medical care. Know when to call your health care provider and local emergency services, and where to get emergency care.  Stay up to date on your immunizations.  When you have an episode of bronchospasm, stay calm. Try to relax and breathe more slowly.  If you have asthma, make sure you have an asthma action plan.  Keep all follow-up visits as told by your health care provider. This is important. Contact a health care provider if:  You have muscle aches.  You have chest pain.  The mucus that you   cough up (sputum) changes from clear or white to yellow, green, gray, or bloody.  You have a fever.  Your sputum gets thicker. Get help right away if:  Your wheezing and coughing get worse, even after you take your prescribed medicines.  It gets even harder to breathe.  You develop severe chest pain. Summary  Bronchospasm is a tightening of the airways going into the lungs.  During an episode of  bronchospasm, you may have a harder time breathing. You may cough and make a whistling sound when you breathe (wheeze).  Avoid exposure to triggers such as smoke, dust, mold, animal dander, and fragrances.  When you have an episode of bronchospasm, stay calm. Try to relax and breathe more slowly. This information is not intended to replace advice given to you by your health care provider. Make sure you discuss any questions you have with your health care provider. Document Released: 07/02/2003 Document Revised: 06/25/2016 Document Reviewed: 06/25/2016 Elsevier Interactive Patient Education  2017 [employer]  

## 2017-11-17 ENCOUNTER — Ambulatory Visit (INDEPENDENT_AMBULATORY_CARE_PROVIDER_SITE_OTHER): Payer: 59 | Admitting: Family Medicine

## 2017-11-17 ENCOUNTER — Encounter: Payer: Self-pay | Admitting: Family Medicine

## 2017-11-17 VITALS — BP 122/70 | HR 57 | Temp 98.6°F | Resp 16 | Ht 70.0 in | Wt 196.0 lb

## 2017-11-17 DIAGNOSIS — Z0000 Encounter for general adult medical examination without abnormal findings: Secondary | ICD-10-CM | POA: Diagnosis not present

## 2017-11-17 DIAGNOSIS — I10 Essential (primary) hypertension: Secondary | ICD-10-CM | POA: Diagnosis not present

## 2017-11-17 DIAGNOSIS — Z8551 Personal history of malignant neoplasm of bladder: Secondary | ICD-10-CM | POA: Diagnosis not present

## 2017-11-17 DIAGNOSIS — Z87891 Personal history of nicotine dependence: Secondary | ICD-10-CM

## 2017-11-17 DIAGNOSIS — Z23 Encounter for immunization: Secondary | ICD-10-CM | POA: Diagnosis not present

## 2017-11-17 DIAGNOSIS — Z125 Encounter for screening for malignant neoplasm of prostate: Secondary | ICD-10-CM | POA: Diagnosis not present

## 2017-11-17 DIAGNOSIS — K219 Gastro-esophageal reflux disease without esophagitis: Secondary | ICD-10-CM | POA: Diagnosis not present

## 2017-11-17 LAB — POCT URINALYSIS DIPSTICK
Bilirubin, UA: NEGATIVE
GLUCOSE UA: NEGATIVE
Nitrite, UA: NEGATIVE
Protein, UA: NEGATIVE
Spec Grav, UA: 1.01 (ref 1.010–1.025)
Urobilinogen, UA: 0.2 E.U./dL
pH, UA: 7 (ref 5.0–8.0)

## 2017-11-17 NOTE — Progress Notes (Signed)
Patient: Jeffrey Fernandez, Male    DOB: [birth year], 62 y.o.   MRN: [id number] Visit Date: 11/17/2017  Today's Provider: Lelon Huh, MD   Chief Complaint  Patient presents with  . Annual Exam  . Hypertension   Subjective:    Annual physical exam Jeffrey Fernandez is a 62 y.o. male who presents today for health maintenance and complete physical. He feels fairly well. He reports no regular exercising. He reports he is sleeping fairly well.  -----------------------------------------------------------------   Hypertension, follow-up:  BP Readings from Last 3 Encounters:  09/06/17 102/64  03/03/17 128/72  07/01/16 122/76    He was last seen for hypertension 9 months ago.  BP at that visit was 128/72. Management since that visit includes; no chnages.He reports good compliance with treatment. He is not having side effects.  He is not exercising. He is not adherent to low salt diet.   Outside blood pressures are not bing checked. He is experiencing none.  Patient denies chest pain, chest pressure/discomfort, claudication, dyspnea, exertional chest pressure/discomfort, fatigue, irregular heart beat, lower extremity edema, near-syncope, orthopnea, palpitations, paroxysmal nocturnal dyspnea, syncope and tachypnea.   Cardiovascular risk factors include advanced age (older than 56 for men, 42 for women), hypertension and male gender.  Use of agents associated with hypertension: NSAIDS.   ------------------------------------------------------------------------ His only complaint today is that he has heartburn every day. No trouble swallowing, no dysphagia. Has started taking garlic which seems to help, but is still having to take OTC antacids every day.    Review of Systems  Constitutional: Negative for appetite change, chills, fatigue and fever.  HENT: Negative for congestion, ear pain, hearing loss, nosebleeds and trouble swallowing.   Eyes: Negative for pain and visual  disturbance.  Respiratory: Negative for cough, chest tightness and shortness of breath.   Cardiovascular: Negative for chest pain, palpitations and leg swelling.  Gastrointestinal: Negative for abdominal pain, blood in stool, constipation, diarrhea, nausea and vomiting.  Endocrine: Negative for polydipsia, polyphagia and polyuria.  Genitourinary: Positive for decreased urine volume. Negative for dysuria and flank pain.  Musculoskeletal: Negative for arthralgias, back pain, joint swelling, myalgias and neck stiffness.  Skin: Negative for color change, rash and wound.  Neurological: Negative for dizziness, tremors, seizures, speech difficulty, weakness, light-headedness and headaches.  Psychiatric/Behavioral: Negative for behavioral problems, confusion, decreased concentration, dysphoric mood and sleep disturbance. The patient is not nervous/anxious.   All other systems reviewed and are negative.   Social History      He  reports that he quit smoking about 6 years ago. He quit after 35.00 years of use. He has never used smokeless tobacco. He reports that he drinks alcohol. He reports that he does not use drugs.       Social History   Socioeconomic History  . Marital status: Married    Spouse name: Not on file  . Number of children: 3  . Years of education: Not on file  . Highest education level: Not on file  Occupational History  . Occupation: [employer] operational officer  . Financial resource strain: Not on file  . Food insecurity:    Worry: Not on file    Inability: Not on file  . Transportation needs:    Medical: Not on file    Non-medical: Not on file  Tobacco Use  . Smoking status: Former Smoker    Years: 35.00    Last attempt to quit: 07/14/2011    Years since  quitting: 6.3  . Smokeless tobacco: Never Used  Substance and Sexual Activity  . Alcohol use: Yes    Alcohol/week: 0.0 oz    Comment: drinks 2-3 beers daily  . Drug use: No  . Sexual activity: Not on file    Lifestyle  . Physical activity:    Days per week: Not on file    Minutes per session: Not on file  . Stress: Not on file  Relationships  . Social connections:    Talks on phone: Not on file    Gets together: Not on file    Attends religious service: Not on file    Active member of club or organization: Not on file    Attends meetings of clubs or organizations: Not on file    Relationship status: Not on file  Other Topics Concern  . Not on file  Social History Narrative  . Not on file    Past Medical History:  Diagnosis Date  . Cancer [facility]) 2013   bladder  . Diverticulitis   . History of chickenpox   . History of measles   . History of mumps   . Hyperlipidemia   . Hypertension      Patient Active Problem List   Diagnosis Date Noted  . Right arm weakness 03/03/2017  . Stopped smoking with greater than 30 pack year history 03/20/2016  . H/O primary malignant neoplasm of urinary bladder 03/19/2016  . Hyperlipidemia 03/19/2016  . Diverticulitis of sigmoid colon 03/19/2016  . Synovial cyst of popliteal space 03/19/2016  . Benign localized hyperplasia of prostate with urinary obstruction and other lower urinary tract symptoms (LUTS)(600.21) 03/08/2013  . 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 Essential (primary) hypertension 07/13/2002    Past Surgical History:  Procedure Laterality Date  . HERNIA REPAIR  2002,2003  . SIGMOIDOSCOPY  1998   [facility]  . TRANSURETHRAL RESECTION OF BLADDER TUMOR  05/11/2012   Dr. Sherral Hammers, [facility]  . VASECTOMY  2002   Bilateral inguinal herniorrhaphies    Family History        Family Status  Relation Name Status  . Mother  Alive  . Father  Alive  . Sister  Alive  . Brother  Alive  . Daughter  Alive  . Son  Alive  . MGM  Deceased  . MGF  Deceased  . PGM  Deceased  . PGF  Alive  . Son  Alive        His family history includes COPD in his mother; Diabetes in his father; Diverticulosis in his  father, mother, and sister; Hypertension in his father; Stroke in his paternal grandmother.      Allergies  Allergen Reactions  . Atenolol Diarrhea    Other reaction(s): HIVES  . Other Nausea And Vomiting    Navy Beans  . Oxycodone     Other reaction(s): HIVES  . Shellfish Allergy Itching and Swelling     Current Outpatient Medications:  .  aspirin 81 MG tablet, Take 81 mg by mouth daily., Disp: , Rfl:  .  losartan-hydrochlorothiazide (HYZAAR) 50-12.5 MG tablet, Take 1 tablet by mouth daily., Disp: 90 tablet, Rfl: 0 .  levofloxacin (LEVAQUIN) 500 MG tablet, Take 1 tablet (500 mg total) by mouth daily. (Patient not taking: Reported on 11/17/2017), Disp: 7 tablet, Rfl: 0   Patient Care Team: Birdie Sons, MD as PCP - General (Family Medicine) Chrismon, Vickki Muff, PA (Physician Assistant) Christene Lye, MD (General Surgery)  System, Provider Not In Murrell Redden, MD (Urology)      Objective:   Vitals: BP 122/70 (BP Location: Left Arm, Patient Position: Sitting, Cuff Size: Large)   Pulse (!) 57   Temp 98.6 F (37 C) (Oral)   Resp 16   Ht 5\' 10"  (1.778 m)   Wt 196 lb (88.9 kg)   SpO2 97% Comment: room air  BMI 28.12 kg/m      Physical Exam   General Appearance:    Alert, cooperative, no distress, appears stated age  Head:    Normocephalic, without obvious abnormality, atraumatic  Eyes:    PERRL, conjunctiva/corneas clear, EOM's intact, fundi    benign, both eyes       Ears:    Normal TM's and external ear canals, both ears  Nose:   Nares normal, septum midline, mucosa normal, no drainage   or sinus tenderness  Throat:   Lips, mucosa, and tongue normal; teeth and gums normal  Neck:   Supple, symmetrical, trachea midline, no adenopathy;       thyroid:  No enlargement/tenderness/nodules; no carotid   bruit or JVD  Back:     Symmetric, no curvature, ROM normal, no CVA tenderness  Lungs:     Clear to auscultation bilaterally, respirations unlabored  Chest  wall:    No tenderness or deformity  Heart:    Regular rate and rhythm, S1 and S2 normal, no murmur, rub   or gallop  Abdomen:     Soft, non-tender, bowel sounds active all four quadrants,    no masses, no organomegaly  Genitalia:    deferred  Rectal:    deferred  Extremities:   Extremities normal, atraumatic, no cyanosis or edema  Pulses:   2+ and symmetric all extremities  Skin:   Skin color, texture, turgor normal, no rashes or lesions  Lymph nodes:   Cervical, supraclavicular, and axillary nodes normal  Neurologic:   CNII-XII intact. Normal strength, sensation and reflexes      throughout    Depression Screen PHQ 2/9 Scores 11/17/2017 03/20/2016  PHQ - 2 Score 0 0  PHQ- 9 Score 1 2      Assessment & Plan:     Routine Health Maintenance and Physical Exam  Exercise Activities and Dietary recommendations Goals    None      Immunization History  Administered Date(s) Administered  . Tdap 03/01/2007    Health Maintenance  Topic Date Due  . HIV Screening  08/30/1972  . TETANUS/TDAP  02/28/2017  . INFLUENZA VACCINE  03/03/2018 (Originally 02/10/2018)  . COLONOSCOPY  06/20/2024  . Hepatitis C Screening  Completed     Discussed health benefits of physical activity, and encouraged him to engage in regular exercise appropriate for his age and condition.    --------------------------------------------------------------------  1. Annual physical exam Generally doing well. Counseled regarding prudent diet and regular exercise. Counseled regarding Shingrix which he will consider.  - Comprehensive metabolic panel - Lipid panel  2. Essential (primary) hypertension Well controlled.  Continue current medications.   - EKG 12-Lead  3. Gastroesophageal reflux disease, esophagitis presence not specified Start daily omeprazole 20mg  - Comprehensive metabolic panel - Lipid panel  4. Prostate cancer screening  - PSA  5. Stopped smoking with greater than 30 pack year  history  - Ambulatory Referral for Lung Cancer Scre  6. Need for Td vaccine  - Td : Tetanus/diphtheria >7yo Preservative  free  7. H/O primary malignant neoplasm of urinary bladder  -  POCT Urinalysis Dipstick   Lelon Huh, MD  [facility]

## 2017-11-17 NOTE — Patient Instructions (Addendum)
   The CDC recommends two doses of Shingrix (the shingles vaccine) separated by 2 to 6 months for adults age 60 years and older. I recommend checking with your insurance plan regarding coverage for this vaccine.    Start taking omeprazole (Prilosec OTC) 20mg  once a day to control acid reflux

## 2017-11-18 ENCOUNTER — Telehealth: Payer: Self-pay | Admitting: *Deleted

## 2017-11-18 DIAGNOSIS — Z87891 Personal history of nicotine dependence: Secondary | ICD-10-CM

## 2017-11-18 DIAGNOSIS — Z122 Encounter for screening for malignant neoplasm of respiratory organs: Secondary | ICD-10-CM

## 2017-11-18 LAB — COMPREHENSIVE METABOLIC PANEL
ALBUMIN: 4.5 g/dL (ref 3.6–4.8)
ALK PHOS: 92 IU/L (ref 39–117)
ALT: 42 IU/L (ref 0–44)
AST: 27 IU/L (ref 0–40)
Albumin/Globulin Ratio: 1.6 (ref 1.2–2.2)
BILIRUBIN TOTAL: 0.9 mg/dL (ref 0.0–1.2)
BUN / CREAT RATIO: 11 (ref 10–24)
BUN: 11 mg/dL (ref 8–27)
CHLORIDE: 100 mmol/L (ref 96–106)
CO2: 24 mmol/L (ref 20–29)
CREATININE: 1.02 mg/dL (ref 0.76–1.27)
Calcium: 10 mg/dL (ref 8.6–10.2)
GFR calc Af Amer: 92 mL/min/{1.73_m2} (ref >59)
GFR calc non Af Amer: 80 mL/min/{1.73_m2} (ref >59)
GLOBULIN, TOTAL: 2.8 g/dL (ref 1.5–4.5)
Glucose: 80 mg/dL (ref 65–99)
Potassium: 4.1 mmol/L (ref 3.5–5.2)
SODIUM: 140 mmol/L (ref 134–144)
Total Protein: 7.3 g/dL (ref 6.0–8.5)

## 2017-11-18 LAB — LIPID PANEL
CHOLESTEROL TOTAL: 181 mg/dL (ref 100–199)
Chol/HDL Ratio: 4.5 ratio (ref 0.0–5.0)
HDL: 40 mg/dL (ref >39)
LDL CALC: 108 mg/dL — AB (ref 0–99)
TRIGLYCERIDES: 167 mg/dL — AB (ref 0–149)
VLDL Cholesterol Cal: 33 mg/dL (ref 5–40)

## 2017-11-18 LAB — PSA: Prostate Specific Ag, Serum: 1.3 ng/mL (ref 0.0–4.0)

## 2017-11-18 NOTE — Telephone Encounter (Signed)
Received referral for initial lung cancer screening scan. Contacted patient and obtained smoking history,(former, quit 2013, 78 pack year) as well as answering questions related to screening process. Patient denies signs of lung cancer such as weight loss or hemoptysis. Patient denies comorbidity that would prevent curative treatment if lung cancer were found. Patient is scheduled for shared decision making visit and CT scan on 12/08/17.

## 2017-12-01 ENCOUNTER — Other Ambulatory Visit: Payer: Self-pay | Admitting: Family Medicine

## 2017-12-01 MED ORDER — LOSARTAN POTASSIUM-HCTZ 50-12.5 MG PO TABS: 1.0000 | ORAL_TABLET | Freq: Every day | ORAL | 4 refills | Status: DC

## 2017-12-08 ENCOUNTER — Encounter: Payer: Self-pay | Admitting: Oncology

## 2017-12-08 ENCOUNTER — Inpatient Hospital Stay: Payer: 59 | Attending: Oncology | Admitting: Oncology

## 2017-12-08 ENCOUNTER — Ambulatory Visit
Admission: RE | Admit: 2017-12-08 | Discharge: 2017-12-08 | Disposition: A | Discharge status: Home / Self Care | Payer: 59 | Source: Ambulatory Visit | Attending: Oncology | Admitting: Oncology

## 2017-12-08 DIAGNOSIS — J439 Emphysema, unspecified: Secondary | ICD-10-CM | POA: Diagnosis not present

## 2017-12-08 DIAGNOSIS — I700 Atherosclerosis of aorta: Secondary | ICD-10-CM | POA: Insufficient documentation

## 2017-12-08 DIAGNOSIS — Z122 Encounter for screening for malignant neoplasm of respiratory organs: Secondary | ICD-10-CM | POA: Diagnosis not present

## 2017-12-08 DIAGNOSIS — Z87891 Personal history of nicotine dependence: Secondary | ICD-10-CM

## 2017-12-08 DIAGNOSIS — Z8551 Personal history of malignant neoplasm of bladder: Secondary | ICD-10-CM | POA: Insufficient documentation

## 2017-12-08 NOTE — Progress Notes (Signed)
In accordance with CMS guidelines, patient has met eligibility criteria including age, absence of signs or symptoms of lung cancer.  Social History   Tobacco Use  . Smoking status: Former Smoker    Packs/day: 2.00    Years: 39.00    Pack years: 78.00    Last attempt to quit: 07/14/2011    Years since quitting: 6.4  . Smokeless tobacco: Never Used  . Tobacco comment: smoked 1 to 1 1/2  ppd for 35 years  Substance Use Topics  . Alcohol use: Yes    Alcohol/week: 0.0 oz    Comment: drinks 2 beers daily  . Drug use: No     A shared decision-making session was conducted prior to the performance of CT scan. This includes one or more decision aids, includes benefits and harms of screening, follow-up diagnostic testing, over-diagnosis, false positive rate, and total radiation exposure.  Counseling on the importance of adherence to annual lung cancer LDCT screening, impact of co-morbidities, and ability or willingness to undergo diagnosis and treatment is imperative for compliance of the program.  Counseling on the importance of continued smoking cessation for former smokers; the importance of smoking cessation for current smokers, and information about tobacco cessation interventions have been given to patient including [place] and [facility] quit [place] programs.  Written order for lung cancer screening with LDCT has been given to the patient and any and all questions have been answered to the best of my abilities.   Yearly follow up will be coordinated by Burgess Estelle, Thoracic Navigator.  Faythe Casa, NP 12/08/2017 12:26 PM

## 2017-12-13 ENCOUNTER — Encounter: Payer: Self-pay | Admitting: Family Medicine

## 2017-12-13 ENCOUNTER — Telehealth: Payer: Self-pay | Admitting: *Deleted

## 2017-12-13 DIAGNOSIS — J439 Emphysema, unspecified: Secondary | ICD-10-CM | POA: Insufficient documentation

## 2017-12-13 DIAGNOSIS — I700 Atherosclerosis of aorta: Secondary | ICD-10-CM | POA: Insufficient documentation

## 2017-12-13 NOTE — Telephone Encounter (Signed)
Notified patient of LDCT lung cancer screening program results with recommendation for 12 month follow up imaging. Also notified of incidental findings noted below and is encouraged to discuss further with PCP who will receive a copy of this note and/or the CT report. Patient verbalizes understanding.   IMPRESSION: Lung-RADS 2, benign appearance or behavior. Continue annual screening with low-dose chest CT without contrast in 12 months.  Aortic Atherosclerosis (ICD10-I70.0) and Emphysema (ICD10-J43.9).  

## 2018-10-03 ENCOUNTER — Other Ambulatory Visit: Payer: Self-pay

## 2018-10-03 ENCOUNTER — Ambulatory Visit: Payer: BLUE CROSS/BLUE SHIELD | Admitting: Family Medicine

## 2018-10-03 ENCOUNTER — Encounter: Payer: Self-pay | Admitting: Family Medicine

## 2018-10-03 DIAGNOSIS — J301 Allergic rhinitis due to pollen: Secondary | ICD-10-CM

## 2018-10-03 DIAGNOSIS — R05 Cough: Secondary | ICD-10-CM

## 2018-10-03 DIAGNOSIS — R059 Cough, unspecified: Secondary | ICD-10-CM

## 2018-10-03 MED ORDER — LEVOFLOXACIN 750 MG PO TABS: 750.0000 mg | ORAL_TABLET | Freq: Every day | ORAL | 0 refills | Status: AC

## 2018-10-03 MED ORDER — MONTELUKAST SODIUM 10 MG PO TABS: 10.0000 mg | ORAL_TABLET | Freq: Every day | ORAL | 3 refills | Status: DC

## 2018-10-03 NOTE — Progress Notes (Signed)
Patient: Jeffrey Fernandez Male    DOB: 03-10-1956   63 y.o.   MRN: [id number] Visit Date: 10/03/2018  Today's Provider: Lelon Huh, MD   Chief Complaint  Patient presents with  . Cough   Subjective:     Cough  This is a new problem. Episode onset: Started about a month ago. The problem has been gradually worsening (Especially in the last week.). The cough is productive of sputum. Associated symptoms include ear congestion, ear pain (RIght ear), headaches, heartburn, postnasal drip, rhinorrhea, a sore throat, shortness of breath and wheezing. Pertinent negatives include no chest pain, fever, hemoptysis or nasal congestion. He has tried OTC cough suppressant for the symptoms. The treatment provided mild relief.  Had flu symptoms about a month ago and all sx cough resolved. Has similar symptoms every spring associated with allergy season.   Allergies  Allergen Reactions  . Atenolol Diarrhea    Other reaction(s): HIVES  . Other Nausea And Vomiting    Navy Beans  . Oxycodone     Other reaction(s): HIVES  . Shellfish Allergy Itching and Swelling     Current Outpatient Medications:  .  aspirin 81 MG tablet, Take 81 mg by mouth daily., Disp: , Rfl:  .  losartan-hydrochlorothiazide (HYZAAR) 50-12.5 MG tablet, Take 1 tablet by mouth daily., Disp: 90 tablet, Rfl: 4  Review of Systems  Constitutional: Negative.  Negative for fever.  HENT: Positive for congestion, ear pain (RIght ear), postnasal drip, rhinorrhea and sore throat. Negative for ear discharge, hearing loss, sinus pressure, sinus pain, sneezing and tinnitus.   Eyes: Negative.   Respiratory: Positive for cough, shortness of breath and wheezing. Negative for apnea, hemoptysis, choking and chest tightness.   Cardiovascular: Negative for chest pain.  Gastrointestinal: Positive for heartburn.  Neurological: Positive for light-headedness and headaches. Negative for dizziness.    Social History   Tobacco Use  . Smoking  status: Former Smoker    Packs/day: 2.00    Years: 39.00    Pack years: 78.00    Last attempt to quit: 07/14/2011    Years since quitting: 7.2  . Smokeless tobacco: Never Used  . Tobacco comment: smoked 1 to 1 1/2  ppd for 35 years  Substance Use Topics  . Alcohol use: Yes    Alcohol/week: 0.0 standard drinks    Comment: drinks 2 beers daily      Objective:   BP 132/88 (BP Location: Right Arm, Patient Position: Sitting, Cuff Size: Large)   Pulse 76   Temp 98.4 F (36.9 C) (Oral)   Resp 16   Wt 199 lb (90.3 kg)   BMI 28.55 kg/m  Vitals:   10/03/18 0920  BP: 132/88  Pulse: 76  Resp: 16  Temp: 98.4 F (36.9 C)  TempSrc: Oral  Weight: 199 lb (90.3 kg)     Physical Exam  General Appearance:    Alert, cooperative, no distress  HENT:   ENT exam normal, no neck nodes or sinus tenderness  Eyes:    PERRL, conjunctiva/corneas clear, EOM's intact       Lungs:     Clear to auscultation bilaterally except for rare expiratory wheeze, respirations unlabored  Heart:    Regular rate and rhythm  Neurologic:   Awake, alert, oriented x 3. No apparent focal neurological           defect.          Assessment & Plan    1. Cough  Persistent since recovering from flu sx a month ago. Similar sx in past required antibiotic for bronchitis.  - levofloxacin (LEVAQUIN) 750 MG tablet; Take 1 tablet (750 mg total) by mouth daily for 7 days.  Dispense: 7 tablet; Refill: 0  2. Seasonal allergic rhinitis due to pollen  - montelukast (SINGULAIR) 10 MG tablet; Take 1 tablet (10 mg total) by mouth at bedtime.  Dispense: 30 tablet; Refill: 3  Call if symptoms change or if not rapidly improving.        Lelon Huh, MD  [facility]

## 2018-10-03 NOTE — Patient Instructions (Addendum)
.   Please review the attached list of medications and notify my office if there are any errors.   . Please bring all of your medications to every appointment so we can make sure that our medication list is the same as yours.    Try OTC fexofenadine (Allegra) daily for allergies   If cough not much better with Allegra and montelukast then we can call in a prescription for .Tessalon

## 2018-11-25 ENCOUNTER — Encounter: Payer: Self-pay | Admitting: Family Medicine

## 2018-11-25 ENCOUNTER — Other Ambulatory Visit: Payer: Self-pay

## 2018-11-25 ENCOUNTER — Ambulatory Visit: Payer: BLUE CROSS/BLUE SHIELD | Admitting: Family Medicine

## 2018-11-25 VITALS — BP 153/98 | HR 72 | Temp 98.5°F | Wt 202.0 lb

## 2018-11-25 DIAGNOSIS — E785 Hyperlipidemia, unspecified: Secondary | ICD-10-CM

## 2018-11-25 DIAGNOSIS — Z125 Encounter for screening for malignant neoplasm of prostate: Secondary | ICD-10-CM

## 2018-11-25 DIAGNOSIS — I10 Essential (primary) hypertension: Secondary | ICD-10-CM | POA: Diagnosis not present

## 2018-11-25 DIAGNOSIS — K219 Gastro-esophageal reflux disease without esophagitis: Secondary | ICD-10-CM | POA: Diagnosis not present

## 2018-11-25 DIAGNOSIS — R079 Chest pain, unspecified: Secondary | ICD-10-CM

## 2018-11-25 DIAGNOSIS — R002 Palpitations: Secondary | ICD-10-CM

## 2018-11-25 MED ORDER — METOPROLOL SUCCINATE ER 25 MG PO TB24: 25.0000 mg | ORAL_TABLET | Freq: Every day | ORAL | 1 refills | Status: DC

## 2018-11-25 MED ORDER — PANTOPRAZOLE SODIUM 40 MG PO TBEC: 40.0000 mg | DELAYED_RELEASE_TABLET | Freq: Every day | ORAL | 3 refills | Status: DC

## 2018-11-25 NOTE — Patient Instructions (Signed)
.   Please review the attached list of medications and notify my office if there are any errors.   . Please bring all of your medications to every appointment so we can make sure that our medication list is the same as yours.   

## 2018-11-25 NOTE — Progress Notes (Addendum)
Patient: Jeffrey Fernandez Male    DOB: [birth year]   63 y.o.   MRN: [id number] Visit Date: 11/25/2018  Today's Provider: Lelon Huh, MD   Chief Complaint  Patient presents with  . Hypertension   Subjective:    I,   HPI  Hypertension, follow-up:  BP Readings from Last 3 Encounters:  11/25/18 (!) 153/98  10/03/18 132/88  11/17/17 122/70    He was last seen for hypertension 1 years ago.  BP at that visit was 122/70. Management changes since that visit include none. He reports good compliance with treatment. He is having side effects.  He is not exercising. He is adherent to low salt diet.    He is experiencing chest pressure/discomfort and palpitations.  Patient denies claudication, dyspnea, exertional chest pressure/discomfort, irregular heart beat, lower extremity edema, near-syncope, orthopnea, paroxysmal nocturnal dyspnea and tachypnea.   States that he has episodes of pressure in has chest most days which lasts anywhere from a few minutes to a an hour. Not particular triggering events, but is worse and more persistent when lying down. If he is active then it will improve when he stops to rest. Has tried OTC acid reducing medications which haven't helped.     Weight trend: fluctuating a bit Wt Readings from Last 3 Encounters:  11/25/18 202 lb (91.6 kg)  10/03/18 199 lb (90.3 kg)  12/08/17 195 lb (88.5 kg)    Current diet: well balance.  ------------------------------------------------------------------------  Allergies  Allergen Reactions  . Atenolol Diarrhea    Other reaction(s): HIVES  . Other Nausea And Vomiting    Navy Beans  . Oxycodone     Other reaction(s): HIVES  . Shellfish Allergy Itching and Swelling     Current Outpatient Medications:  .  aspirin 81 MG tablet, Take 81 mg by mouth daily., Disp: , Rfl:  .  losartan-hydrochlorothiazide (HYZAAR) 50-12.5 MG tablet, Take 1 tablet by mouth daily., Disp: 90 tablet, Rfl: 4 .  montelukast  (SINGULAIR) 10 MG tablet, Take 1 tablet (10 mg total) by mouth at bedtime., Disp: 30 tablet, Rfl: 3  Review of Systems  Constitutional: Negative.   Respiratory: Positive for chest tightness.   Cardiovascular: Positive for palpitations.  Musculoskeletal: Positive for back pain.  Neurological: Positive for light-headedness.  Psychiatric/Behavioral: Negative.    family history includes COPD in his mother; Diabetes in his father; Diverticulosis in his father, mother, and sister; Hypertension in his father; Stroke in his paternal grandmother.  Social History   Tobacco Use  . Smoking status: Former Smoker    Packs/day: 2.00    Years: 39.00    Pack years: 78.00    Last attempt to quit: 07/14/2011    Years since quitting: 7.3  . Smokeless tobacco: Never Used  . Tobacco comment: smoked 1 to 1 1/2  ppd for 35 years  Substance Use Topics  . Alcohol use: Yes    Alcohol/week: 0.0 standard drinks    Comment: drinks 2 beers daily      Objective:   BP (!) 153/98 (BP Location: Right Arm, Patient Position: Sitting, Cuff Size: Normal)   Pulse 72   Temp 98.5 F (36.9 C) (Oral)   Wt 202 lb (91.6 kg)   BMI 28.98 kg/m  Vitals:   11/25/18 0822  BP: (!) 153/98  Pulse: 72  Temp: 98.5 F (36.9 C)  TempSrc: Oral  Weight: 202 lb (91.6 kg)     Physical Exam   General Appearance:  Alert, cooperative, no distress  Eyes:    PERRL, conjunctiva/corneas clear, EOM's intact       Lungs:     Clear to auscultation bilaterally, respirations unlabored  Heart:    Regular rate and rhythm  Neurologic:   Awake, alert, oriented x 3. No apparent focal neurological           defect.      The 10-year ASCVD risk score Mikey Bussing DC Brooke Bonito., et al., 2013) is: 15%   Values used to calculate the score:     Age: 36 years     Sex: Male     Is Non-Hispanic African American: No     Diabetic: No     Tobacco smoker: No     Systolic Blood Pressure: 704 mmHg     Is BP treated: Yes     HDL Cholesterol: 43 mg/dL      Total Cholesterol: 182 mg/dL      Assessment & Plan    1. Chest pain, unspecified type He does have multiple cardiac risk factors with ASCVD risk of 15%. Need to rule out ischemic heart disease - NM Myocar Multi W/Spect W/Wall Motion / EF; Future  2. Essential (primary) hypertension add- metoprolol succinate (TOPROL-XL) 25 MG 24 hr tablet; Take 1 tablet (25 mg total) by mouth daily.  Dispense: 30 tablet; Refill: 1 - TSH  3. Hyperlipidemia, unspecified hyperlipidemia type Will likely need statin. check - Comprehensive metabolic panel - Lipid panel  4. Gastroesophageal reflux disease, esophagitis presence not specified Previously taken ranitidine which has been  Taken off market due to possible NMDA contamination  - pantoprazole (PROTONIX) 40 MG tablet; Take 1 tablet (40 mg total) by mouth daily.  Dispense: 30 tablet; Refill: 3  5. Palpitations  - metoprolol succinate (TOPROL-XL) 25 MG 24 hr tablet; Take 1 tablet (25 mg total) by mouth daily.  Dispense: 30 tablet; Refill: 1 - Magnesium - TSH  6. Prostate cancer screening  - PSA     The entirety of the information documented in the History of Present Illness, Review of Systems and Physical Exam were personally obtained by me. Portions of this information were initially documented by [facility] CMA,and reviewed by me for thoroughness and accuracy.  Lelon Huh, MD  [facility]

## 2018-11-26 LAB — COMPREHENSIVE METABOLIC PANEL
ALT: 38 IU/L (ref 0–44)
AST: 24 IU/L (ref 0–40)
Albumin/Globulin Ratio: 1.8 (ref 1.2–2.2)
Albumin: 4.5 g/dL (ref 3.8–4.8)
Alkaline Phosphatase: 95 IU/L (ref 39–117)
BUN/Creatinine Ratio: 8 — ABNORMAL LOW (ref 10–24)
BUN: 10 mg/dL (ref 8–27)
Bilirubin Total: 0.6 mg/dL (ref 0.0–1.2)
CO2: 25 mmol/L (ref 20–29)
Calcium: 9.8 mg/dL (ref 8.6–10.2)
Chloride: 99 mmol/L (ref 96–106)
Creatinine, Ser: 1.25 mg/dL (ref 0.76–1.27)
GFR calc Af Amer: 71 mL/min/{1.73_m2} (ref >59)
GFR calc non Af Amer: 62 mL/min/{1.73_m2} (ref >59)
Globulin, Total: 2.5 g/dL (ref 1.5–4.5)
Glucose: 86 mg/dL (ref 65–99)
Potassium: 4.1 mmol/L (ref 3.5–5.2)
Sodium: 138 mmol/L (ref 134–144)
Total Protein: 7 g/dL (ref 6.0–8.5)

## 2018-11-26 LAB — LIPID PANEL
Chol/HDL Ratio: 4.2 ratio (ref 0.0–5.0)
Cholesterol, Total: 182 mg/dL (ref 100–199)
HDL: 43 mg/dL (ref >39)
LDL Calculated: 99 mg/dL (ref 0–99)
Triglycerides: 201 mg/dL — ABNORMAL HIGH (ref 0–149)
VLDL Cholesterol Cal: 40 mg/dL (ref 5–40)

## 2018-11-26 LAB — TSH: TSH: 1.53 u[IU]/mL (ref 0.450–4.500)

## 2018-11-26 LAB — PSA: Prostate Specific Ag, Serum: 2.2 ng/mL (ref 0.0–4.0)

## 2018-11-26 LAB — MAGNESIUM: Magnesium: 2.3 mg/dL (ref 1.6–2.3)

## 2018-11-28 ENCOUNTER — Telehealth: Payer: Self-pay

## 2018-11-28 NOTE — Telephone Encounter (Signed)
Pt advised.   Thanks,   -Jeffrey Fernandez  

## 2018-11-28 NOTE — Telephone Encounter (Signed)
-----   Message from Birdie Sons, MD sent at 11/28/2018 11:53 AM EDT ----- Labs are all completely normal. Continue current medications.  Check yearly.

## 2018-12-06 ENCOUNTER — Other Ambulatory Visit: Payer: Self-pay

## 2018-12-06 ENCOUNTER — Ambulatory Visit
Admission: RE | Admit: 2018-12-06 | Discharge: 2018-12-06 | Disposition: A | Discharge status: Home / Self Care | Payer: BLUE CROSS/BLUE SHIELD | Source: Ambulatory Visit | Attending: Family Medicine | Admitting: Family Medicine

## 2018-12-06 DIAGNOSIS — R079 Chest pain, unspecified: Secondary | ICD-10-CM | POA: Insufficient documentation

## 2018-12-06 LAB — NM MYOCAR MULTI W/SPECT W/WALL MOTION / EF
LV dias vol: 63 mL (ref 62–150)
LV sys vol: 18 mL
Peak HR: 98 {beats}/min
Percent HR: 61 %
Rest HR: 57 {beats}/min
TID: 1.18

## 2018-12-06 MED ORDER — REGADENOSON 0.4 MG/5ML IV SOLN
0.4000 mg | Freq: Once | INTRAVENOUS | Status: AC
  Administered 2018-12-06: 0.4 mg via INTRAVENOUS

## 2018-12-06 MED ORDER — TECHNETIUM TC 99M TETROFOSMIN IV KIT
29.7300 | PACK | Freq: Once | INTRAVENOUS | Status: AC | PRN
  Administered 2018-12-06: 29.73 via INTRAVENOUS

## 2018-12-06 MED ORDER — TECHNETIUM TC 99M TETROFOSMIN IV KIT
9.8700 | PACK | Freq: Once | INTRAVENOUS | Status: AC | PRN
  Administered 2018-12-06: 9.87 via INTRAVENOUS

## 2018-12-07 ENCOUNTER — Telehealth: Payer: Self-pay

## 2018-12-07 NOTE — Telephone Encounter (Signed)
Left message to call back  

## 2018-12-07 NOTE — Telephone Encounter (Signed)
-----   Message from Birdie Sons, MD sent at 12/06/2018  1:58 PM EDT ----- Stress test was completely normal, no sign of heart disease. Continue metoprolol that was prescribed at last o.v. and schedule follow up for BP in 2-3 weeks.

## 2018-12-07 NOTE — Telephone Encounter (Signed)
Advised patient of results.  

## 2018-12-11 ENCOUNTER — Other Ambulatory Visit: Payer: Self-pay | Admitting: Family Medicine

## 2018-12-27 ENCOUNTER — Telehealth: Payer: Self-pay | Admitting: *Deleted

## 2018-12-27 DIAGNOSIS — Z87891 Personal history of nicotine dependence: Secondary | ICD-10-CM

## 2018-12-27 DIAGNOSIS — Z122 Encounter for screening for malignant neoplasm of respiratory organs: Secondary | ICD-10-CM

## 2018-12-27 NOTE — Telephone Encounter (Signed)
Patient has been notified that annual lung cancer screening low dose CT scan is due currently or will be in near future. Confirmed that patient is within the age range of 55-77, and asymptomatic, (no signs or symptoms of lung cancer). Patient denies illness that would prevent curative treatment for lung cancer if found. Verified smoking history, (former, quit 2013, 78 pack year). The shared decision making visit was done 12/08/17. Patient is agreeable for CT scan being scheduled.  °

## 2018-12-29 ENCOUNTER — Ambulatory Visit
Admission: RE | Admit: 2018-12-29 | Discharge: 2018-12-29 | Disposition: A | Discharge status: Home / Self Care | Payer: BLUE CROSS/BLUE SHIELD | Source: Ambulatory Visit | Attending: Oncology | Admitting: Oncology

## 2018-12-29 ENCOUNTER — Other Ambulatory Visit: Payer: Self-pay

## 2018-12-29 DIAGNOSIS — Z87891 Personal history of nicotine dependence: Secondary | ICD-10-CM

## 2018-12-29 DIAGNOSIS — Z122 Encounter for screening for malignant neoplasm of respiratory organs: Secondary | ICD-10-CM | POA: Insufficient documentation

## 2018-12-30 ENCOUNTER — Encounter: Payer: Self-pay | Admitting: *Deleted

## 2019-01-15 ENCOUNTER — Other Ambulatory Visit: Payer: Self-pay | Admitting: Family Medicine

## 2019-01-15 DIAGNOSIS — K219 Gastro-esophageal reflux disease without esophagitis: Secondary | ICD-10-CM

## 2019-01-15 DIAGNOSIS — I10 Essential (primary) hypertension: Secondary | ICD-10-CM

## 2019-01-15 DIAGNOSIS — R002 Palpitations: Secondary | ICD-10-CM

## 2019-04-12 ENCOUNTER — Other Ambulatory Visit: Payer: Self-pay | Admitting: Family Medicine

## 2019-04-12 DIAGNOSIS — K219 Gastro-esophageal reflux disease without esophagitis: Secondary | ICD-10-CM

## 2019-08-03 ENCOUNTER — Ambulatory Visit (INDEPENDENT_AMBULATORY_CARE_PROVIDER_SITE_OTHER): Payer: BLUE CROSS/BLUE SHIELD | Admitting: Physician Assistant

## 2019-08-03 ENCOUNTER — Ambulatory Visit: Payer: BLUE CROSS/BLUE SHIELD | Attending: Internal Medicine

## 2019-08-03 DIAGNOSIS — Z20822 Contact with and (suspected) exposure to covid-19: Secondary | ICD-10-CM

## 2019-08-03 DIAGNOSIS — R0981 Nasal congestion: Secondary | ICD-10-CM

## 2019-08-03 NOTE — Progress Notes (Signed)
Patient: Jeffrey Fernandez Male    DOB: 1955/11/01   64 y.o.   MRN: [id number] Visit Date: 08/03/2019  Today's Provider: Trinna Post, PA-C   Chief Complaint  Patient presents with  . URI   Subjective:    I, Jeffrey Fernandez,CMA am acting as a Education administrator for [employer].  Virtual Visit via Telephone Note  I connected with Jeffrey Fernandez on 08/03/19 at  1:40 PM EST by telephone and verified that I am speaking with the correct person using two identifiers.  Location: Patient: Home Provider: Office   I discussed the limitations, risks, security and privacy concerns of performing an evaluation and management service by telephone and the availability of in person appointments. I also discussed with the patient that there may be a patient responsible charge related to this service. The patient expressed understanding and agreed to proceed.   URI  This is a new problem. The current episode started in the past 7 days. The problem has been gradually worsening. There has been no fever. Associated symptoms include congestion, coughing, ear pain, rhinorrhea, sinus pain and a sore throat. Pertinent negatives include no nausea, vomiting or wheezing. He has tried nothing for the symptoms. The treatment provided no relief.  Patient states that he got a COVID test on today, 08/03/2019 @ 8:00 AM. Patient with emphysema. Specifically denies wheezing, SOB above baseline, productive cough and fevers. Has had several days of sinus congestion.     Allergies  Allergen Reactions  . Atenolol Diarrhea    Other reaction(s): HIVES  . Other Nausea And Vomiting    Navy Beans  . Oxycodone     Other reaction(s): HIVES  . Shellfish Allergy Itching and Swelling     Current Outpatient Medications:  .  aspirin 81 MG tablet, Take 81 mg by mouth daily., Disp: , Rfl:  .  losartan-hydrochlorothiazide (HYZAAR) 50-12.5 MG tablet, TAKE 1 TABLET BY MOUTH EVERY DAY, Disp: 90 tablet, Rfl: 4 .  metoprolol  succinate (TOPROL-XL) 25 MG 24 hr tablet, TAKE 1 TABLET BY MOUTH EVERY DAY, Disp: 30 tablet, Rfl: 11 .  montelukast (SINGULAIR) 10 MG tablet, Take 1 tablet (10 mg total) by mouth at bedtime., Disp: 30 tablet, Rfl: 3 .  pantoprazole (PROTONIX) 40 MG tablet, TAKE 1 TABLET BY MOUTH EVERY DAY, Disp: 90 tablet, Rfl: 4  Review of Systems  Constitutional: Negative for appetite change, chills, fatigue and fever.  HENT: Positive for congestion, ear pain, postnasal drip, rhinorrhea, sinus pressure, sinus pain and sore throat.   Respiratory: Positive for cough and shortness of breath. Negative for chest tightness and wheezing.   Gastrointestinal: Negative for nausea and vomiting.  Neurological: Negative for weakness.    Social History   Tobacco Use  . Smoking status: Former Smoker    Packs/day: 2.00    Years: 39.00    Pack years: 78.00    Quit date: 07/14/2011    Years since quitting: 8.0  . Smokeless tobacco: Never Used  . Tobacco comment: smoked 1 to 1 1/2  ppd for 35 years  Substance Use Topics  . Alcohol use: Yes    Alcohol/week: 0.0 standard drinks    Comment: drinks 2 beers daily      Objective:   There were no vitals taken for this visit. There were no vitals filed for this visit.There is no height or weight on file to calculate BMI.   Physical Exam   No results found for any visits  on 08/03/19.     Assessment & Plan    1. Sinus congestion  COVID test pending. Counseled on 10 day self limiting recovery process. Advised coricidn, mucinex, tylenol and delsym. Should call back if wheezing or SOB. If not better in one week, will send in antibiotics.    I discussed the assessment and treatment plan with the patient. The patient was provided an opportunity to ask questions and all were answered. The patient agreed with the plan and demonstrated an understanding of the instructions.   The patient was advised to call back or seek an in-person evaluation if the symptoms worsen or if  the condition fails to improve as anticipated.  I provided 15 minutes of non-face-to-face time during this encounter.    Trinna Post, PA-C  [facility]

## 2019-08-03 NOTE — Patient Instructions (Signed)
Coricidin: Decongestant for people with high blood pressure.

## 2019-08-04 ENCOUNTER — Telehealth: Payer: Self-pay | Admitting: General Practice

## 2019-08-04 LAB — NOVEL CORONAVIRUS, NAA: SARS-CoV-2, NAA: NOT DETECTED

## 2019-08-04 NOTE — Telephone Encounter (Signed)
Gave patient negative covid test results Patient understood 

## 2019-12-28 ENCOUNTER — Telehealth: Payer: Self-pay

## 2019-12-28 DIAGNOSIS — Z87891 Personal history of nicotine dependence: Secondary | ICD-10-CM

## 2019-12-28 DIAGNOSIS — Z122 Encounter for screening for malignant neoplasm of respiratory organs: Secondary | ICD-10-CM

## 2019-12-28 NOTE — Telephone Encounter (Signed)
Message left notifying patient that it is time to schedule the low dose lung cancer screening CT scan.  Instructed patient to return call to Shawn Perkins at [phone] to verify information prior to CT scan being scheduled.    

## 2019-12-29 NOTE — Telephone Encounter (Signed)
Patient has been notified that annual lung cancer screening low dose CT scan is due currently or will be in near future. Confirmed that patient is within the age range of 55-77, and asymptomatic, (no signs or symptoms of lung cancer). Patient denies illness that would prevent curative treatment for lung cancer if found. Verified smoking history, (former, quit 2013, 78 pack year). The shared decision making visit was done 12/08/17. Patient is agreeable for CT scan being scheduled.

## 2019-12-29 NOTE — Addendum Note (Signed)
Addended by: Lieutenant Diego on: 12/29/2019 10:05 AM   Modules accepted: Orders

## 2020-01-03 ENCOUNTER — Other Ambulatory Visit: Payer: Self-pay | Admitting: Family Medicine

## 2020-01-03 NOTE — Telephone Encounter (Signed)
Requested medications are due for refill today?  Yes  Requested medications are on active medication list?  Yes  Last Refill:   12/11/2018  # 90 with 4 refills   Future visit scheduled? No   Notes to Clinic:  Medication failed Rx refill protocol due to no labs within the past 180 days.  Last labs were performed on 11/25/2018.  Patient is due for CPE.

## 2020-01-05 ENCOUNTER — Other Ambulatory Visit: Payer: Self-pay

## 2020-01-05 ENCOUNTER — Ambulatory Visit
Admission: RE | Admit: 2020-01-05 | Discharge: 2020-01-05 | Disposition: A | Discharge status: Home / Self Care | Payer: BLUE CROSS/BLUE SHIELD | Source: Ambulatory Visit | Attending: Oncology | Admitting: Oncology

## 2020-01-05 DIAGNOSIS — Z87891 Personal history of nicotine dependence: Secondary | ICD-10-CM | POA: Insufficient documentation

## 2020-01-05 DIAGNOSIS — Z122 Encounter for screening for malignant neoplasm of respiratory organs: Secondary | ICD-10-CM | POA: Diagnosis present

## 2020-01-06 ENCOUNTER — Other Ambulatory Visit: Payer: Self-pay | Admitting: Family Medicine

## 2020-01-06 DIAGNOSIS — R002 Palpitations: Secondary | ICD-10-CM

## 2020-01-06 DIAGNOSIS — I10 Essential (primary) hypertension: Secondary | ICD-10-CM

## 2020-01-06 NOTE — Telephone Encounter (Signed)
30 day courtesy RF Requested Prescriptions  Pending Prescriptions Disp Refills  . metoprolol succinate (TOPROL-XL) 25 MG 24 hr tablet [Pharmacy Med Name: METOPROLOL SUCC ER 25 MG TAB] 30 tablet 0    Sig: TAKE 1 TABLET BY MOUTH EVERY DAY     Cardiovascular:  Beta Blockers Failed - 01/06/2020  9:36 AM      Failed - Last BP in normal range    BP Readings from Last 1 Encounters:  11/25/18 (!) 153/98         Failed - Valid encounter within last 6 months    Recent Outpatient Visits          5 months ago Sinus congestion   [facility] [place], Wendee Beavers, [place]   1 year ago Chest pain, unspecified type   [facility] Birdie Sons, MD   1 year ago Cough   [facility] Birdie Sons, MD   2 years ago Annual physical exam   [facility] Birdie Sons, MD   2 years ago [place], [place]      Future Appointments            In 6 days Fisher, Kirstie Peri, MD [facility]           Passed - Last Heart Rate in normal range    Pulse Readings from Last 1 Encounters:  11/25/18 72

## 2020-01-10 ENCOUNTER — Encounter: Payer: Self-pay | Admitting: *Deleted

## 2020-01-10 NOTE — Progress Notes (Signed)
I,Roshena L Chambers,acting as a scribe for Lelon Huh, MD.,have documented all relevant documentation on the behalf of Lelon Huh, MD,as directed by  Lelon Huh, MD while in the presence of Lelon Huh, MD.   Established patient visit   Patient: Jeffrey Fernandez   DOB: May 18, 1956   64 y.o. Male  MRN: [id number] Visit Date: 01/12/2020  Today's healthcare provider: Lelon Huh, MD   Chief Complaint  Patient presents with  . Gastroesophageal Reflux  . Hyperlipidemia  . Hypertension   Subjective    HPI  Hypertension, follow-up  BP Readings from Last 3 Encounters:  01/12/20 111/74  11/25/18 (!) 153/98  10/03/18 132/88   Wt Readings from Last 3 Encounters:  01/12/20 190 lb (86.2 kg)  01/05/20 193 lb (87.5 kg)  12/29/18 198 lb (89.8 kg)     He was last seen for hypertension 1 years ago.  BP at that visit was 153/98. Management since that visit includes added Metoprolol 25 mg. He reports good compliance with treatment. He is not having side effects.  He is not exercising. He is not adherent to low salt diet.   Outside blood pressures are not checked.  He does not smoke.  Use of agents associated with hypertension: none.   --------------------------------------------------------------------------------------------------- Lipid/Cholesterol, follow-up  Last Lipid Panel: Lab Results  Component Value Date   CHOL 182 11/25/2018   LDLCALC 99 11/25/2018   HDL 43 11/25/2018   TRIG 201 (H) 11/25/2018    He was last seen for this 1 years ago.  Management since that visit includes no change.  He reports good compliance with treatment. He is not having side effects.   Symptoms: No appetite changes No foot ulcerations  No chest pain No chest pressure/discomfort  No dyspnea No orthopnea  No fatigue No lower extremity edema  No palpitations No paroxysmal nocturnal dyspnea  No nausea No numbness or tingling of extremity  No polydipsia No polyuria  No  speech difficulty No syncope   He is following a Regular diet. Current exercise: none  Last metabolic panel Lab Results  Component Value Date   GLUCOSE 86 11/25/2018   NA 138 11/25/2018   K 4.1 11/25/2018   BUN 10 11/25/2018   CREATININE 1.25 11/25/2018   GFRNONAA 62 11/25/2018   GFRAA 71 11/25/2018   CALCIUM 9.8 11/25/2018   AST 24 11/25/2018   ALT 38 11/25/2018   The 10-year ASCVD risk score Mikey Bussing DC Jr., et al., 2013) is: 9.4%  --------------------------------------------------------------------------------------------------- GERD, Follow up:  The patient was last seen for GERD 1 years ago. Changes made since that visit include started Protonix 40 mg.  He reports good compliance with treatment. He is not having side effects. Marland Kitchen   -----------------------------------------------------------------------------------------      Medications: Outpatient Medications Prior to Visit  Medication Sig  . aspirin 81 MG tablet Take 81 mg by mouth daily.  Marland Kitchen losartan-hydrochlorothiazide (HYZAAR) 50-12.5 MG tablet TAKE 1 TABLET BY MOUTH EVERY DAY  . metoprolol succinate (TOPROL-XL) 25 MG 24 hr tablet TAKE 1 TABLET BY MOUTH EVERY DAY  . pantoprazole (PROTONIX) 40 MG tablet TAKE 1 TABLET BY MOUTH EVERY DAY  . montelukast (SINGULAIR) 10 MG tablet Take 1 tablet (10 mg total) by mouth at bedtime. (Patient not taking: Reported on 01/12/2020)   No facility-administered medications prior to visit.    Review of Systems  Constitutional: Negative for appetite change, chills and fever.  Respiratory: Negative for chest tightness, shortness of breath and wheezing.  Cardiovascular: Negative for chest pain and palpitations.  Gastrointestinal: Negative for abdominal pain, nausea and vomiting.      Objective    BP 120/72   Pulse 68   Temp (!) 97.1 F (36.2 C) (Temporal)   Resp 16   Wt 190 lb (86.2 kg)   BMI 27.26 kg/m    Physical Exam   General: Appearance:     Well developed,  well nourished male in no acute distress  Eyes:    PERRL, conjunctiva/corneas clear, EOM's intact       Lungs:     Clear to auscultation bilaterally, respirations unlabored  Heart:    Normal heart rate. Normal rhythm. No murmurs, rubs, or gallops.   MS:   All extremities are intact.   Neurologic:   Awake, alert, oriented x 3. No apparent focal neurological           defect.         No results found for any visits on 01/12/20.  Assessment & Plan     1. Essential (primary) hypertension Very well controlled, Continue current medications.  Has stopped smoking.  - Lipid panel  2. Hyperlipidemia, unspecified hyperlipidemia type Diet controlled.  - CBC - Comprehensive metabolic panel  3. Gastroesophageal reflux disease, unspecified whether esophagitis present Well controlled on current PPI  5. Emphysema of lung Incidental finding on LDCT. He is asymptomatic and has quit smoking.   4. Prostate cancer screening  - PSA Total (Reflex To Free) ([facility] only)         The entirety of the information documented in the History of Present Illness, Review of Systems and Physical Exam were personally obtained by me. Portions of this information were initially documented by the CMA and reviewed by me for thoroughness and accuracy.      Lelon Huh, MD  [facility] [phone] (phone) [phone] (fax)  [place]

## 2020-01-12 ENCOUNTER — Ambulatory Visit (INDEPENDENT_AMBULATORY_CARE_PROVIDER_SITE_OTHER): Payer: BLUE CROSS/BLUE SHIELD | Admitting: Family Medicine

## 2020-01-12 ENCOUNTER — Encounter: Payer: Self-pay | Admitting: Family Medicine

## 2020-01-12 ENCOUNTER — Other Ambulatory Visit: Payer: Self-pay

## 2020-01-12 VITALS — BP 120/72 | HR 68 | Temp 97.1°F | Resp 16 | Wt 190.0 lb

## 2020-01-12 DIAGNOSIS — K219 Gastro-esophageal reflux disease without esophagitis: Secondary | ICD-10-CM

## 2020-01-12 DIAGNOSIS — Z125 Encounter for screening for malignant neoplasm of prostate: Secondary | ICD-10-CM | POA: Diagnosis not present

## 2020-01-12 DIAGNOSIS — I10 Essential (primary) hypertension: Secondary | ICD-10-CM

## 2020-01-12 DIAGNOSIS — E785 Hyperlipidemia, unspecified: Secondary | ICD-10-CM | POA: Diagnosis not present

## 2020-01-12 DIAGNOSIS — J439 Emphysema, unspecified: Secondary | ICD-10-CM

## 2020-01-12 NOTE — Patient Instructions (Addendum)
   The CDC recommends two doses of Shingrix (the shingles vaccine) separated by 2 to 6 months for adults age 62 years and older. I recommend checking with your pharmacy plan regarding coverage for this vaccine.     

## 2020-01-13 LAB — COMPREHENSIVE METABOLIC PANEL
ALT: 28 IU/L (ref 0–44)
AST: 17 IU/L (ref 0–40)
Albumin/Globulin Ratio: 1.6 (ref 1.2–2.2)
Albumin: 4.6 g/dL (ref 3.8–4.8)
Alkaline Phosphatase: 96 IU/L (ref 48–121)
BUN/Creatinine Ratio: 10 (ref 10–24)
BUN: 11 mg/dL (ref 8–27)
Bilirubin Total: 0.7 mg/dL (ref 0.0–1.2)
CO2: 25 mmol/L (ref 20–29)
Calcium: 10 mg/dL (ref 8.6–10.2)
Chloride: 100 mmol/L (ref 96–106)
Creatinine, Ser: 1.09 mg/dL (ref 0.76–1.27)
GFR calc Af Amer: 84 mL/min/{1.73_m2} (ref >59)
GFR calc non Af Amer: 72 mL/min/{1.73_m2} (ref >59)
Globulin, Total: 2.8 g/dL (ref 1.5–4.5)
Glucose: 88 mg/dL (ref 65–99)
Potassium: 4.6 mmol/L (ref 3.5–5.2)
Sodium: 139 mmol/L (ref 134–144)
Total Protein: 7.4 g/dL (ref 6.0–8.5)

## 2020-01-13 LAB — CBC
Hematocrit: 50.5 % (ref 37.5–51.0)
Hemoglobin: 17.2 g/dL (ref 13.0–17.7)
MCH: 33.1 pg — ABNORMAL HIGH (ref 26.6–33.0)
MCHC: 34.1 g/dL (ref 31.5–35.7)
MCV: 97 fL (ref 79–97)
Platelets: 347 10*3/uL (ref 150–450)
RBC: 5.2 x10E6/uL (ref 4.14–5.80)
RDW: 12.8 % (ref 11.6–15.4)
WBC: 8.2 10*3/uL (ref 3.4–10.8)

## 2020-01-13 LAB — LIPID PANEL
Chol/HDL Ratio: 5 ratio (ref 0.0–5.0)
Cholesterol, Total: 189 mg/dL (ref 100–199)
HDL: 38 mg/dL — ABNORMAL LOW (ref >39)
LDL Chol Calc (NIH): 113 mg/dL — ABNORMAL HIGH (ref 0–99)
Triglycerides: 219 mg/dL — ABNORMAL HIGH (ref 0–149)
VLDL Cholesterol Cal: 38 mg/dL (ref 5–40)

## 2020-01-13 LAB — PSA TOTAL (REFLEX TO FREE): Prostate Specific Ag, Serum: 1.8 ng/mL (ref 0.0–4.0)

## 2020-01-22 ENCOUNTER — Other Ambulatory Visit: Payer: Self-pay | Admitting: Family Medicine

## 2020-01-22 DIAGNOSIS — R002 Palpitations: Secondary | ICD-10-CM

## 2020-01-22 DIAGNOSIS — I10 Essential (primary) hypertension: Secondary | ICD-10-CM

## 2020-01-22 MED ORDER — METOPROLOL SUCCINATE ER 25 MG PO TB24: 25.0000 mg | ORAL_TABLET | Freq: Every day | ORAL | 4 refills | Status: DC

## 2020-01-22 MED ORDER — LOSARTAN POTASSIUM-HCTZ 50-12.5 MG PO TABS: 1.0000 | ORAL_TABLET | Freq: Every day | ORAL | 4 refills | Status: DC

## 2020-06-07 ENCOUNTER — Ambulatory Visit: Payer: Self-pay | Admitting: *Deleted

## 2020-06-07 NOTE — Telephone Encounter (Signed)
Patient is calling to report he passed a clot with urination yesterday- no blood since. Patient reports he is not having any symptoms of urinary problems- per protocol- patient needs to be seen 24 hours- appointment scheduled for Monday am- when office opens- patient instructed to go to UC/ED if he should see blood again or develop nay other symptoms.  Reason for Disposition . Blood in urine  (Exception: could be normal menstrual bleeding)  Answer Assessment - Initial Assessment Questions 1. COLOR of URINE: "Describe the color of the urine."  (e.g., tea-colored, pink, red, blood clots, bloody)     Clots, bloody 2. ONSET: "When did the bleeding start?"      yesterday 3. EPISODES: "How many times has there been blood in the urine?" or "How many times today?"     One time- nothing since 4. PAIN with URINATION: "Is there any pain with passing your urine?" If Yes, ask: "How bad is the pain?"  (Scale 1-10; or mild, moderate, severe)    - MILD - complains slightly about urination hurting    - MODERATE - interferes with normal activities      - SEVERE - excruciating, unwilling or unable to urinate because of the pain      No pain 5. FEVER: "Do you have a fever?" If Yes, ask: "What is your temperature, how was it measured, and when did it start?"     no 6. ASSOCIATED SYMPTOMS: "Are you passing urine more frequently than usual?"     No changes 7. OTHER SYMPTOMS: "Do you have any other symptoms?" (e.g., back/flank pain, abdominal pain, vomiting)     no 8. PREGNANCY: "Is there any chance you are pregnant?" "When was your last menstrual period?"     n/a  Protocols used: URINE - BLOOD IN-A-AH

## 2020-06-10 ENCOUNTER — Encounter: Payer: Self-pay | Admitting: Adult Health

## 2020-06-10 ENCOUNTER — Other Ambulatory Visit: Payer: Self-pay

## 2020-06-10 ENCOUNTER — Ambulatory Visit
Admission: RE | Admit: 2020-06-10 | Discharge: 2020-06-10 | Disposition: A | Discharge status: Home / Self Care | Payer: BLUE CROSS/BLUE SHIELD | Attending: Adult Health | Admitting: Adult Health

## 2020-06-10 ENCOUNTER — Ambulatory Visit
Admission: RE | Admit: 2020-06-10 | Discharge: 2020-06-10 | Disposition: A | Discharge status: Home / Self Care | Payer: BLUE CROSS/BLUE SHIELD | Source: Ambulatory Visit | Attending: Adult Health | Admitting: Adult Health

## 2020-06-10 ENCOUNTER — Ambulatory Visit: Payer: BLUE CROSS/BLUE SHIELD | Admitting: Adult Health

## 2020-06-10 VITALS — BP 160/96 | HR 84 | Temp 98.1°F | Resp 16 | Wt 197.8 lb

## 2020-06-10 DIAGNOSIS — R319 Hematuria, unspecified: Secondary | ICD-10-CM | POA: Diagnosis not present

## 2020-06-10 DIAGNOSIS — Z8551 Personal history of malignant neoplasm of bladder: Secondary | ICD-10-CM

## 2020-06-10 LAB — POCT URINALYSIS DIPSTICK
Bilirubin, UA: NEGATIVE
Blood, UA: NEGATIVE
Glucose, UA: NEGATIVE
Ketones, UA: NEGATIVE
Leukocytes, UA: NEGATIVE
Nitrite, UA: NEGATIVE
Protein, UA: NEGATIVE
Spec Grav, UA: <=1.005 — AB (ref 1.010–1.025)
Urobilinogen, UA: 0.2 E.U./dL
pH, UA: 5 (ref 5.0–8.0)

## 2020-06-10 MED ORDER — SULFAMETHOXAZOLE-TRIMETHOPRIM 800-160 MG PO TABS: 1.0000 | ORAL_TABLET | Freq: Two times a day (BID) | ORAL | 0 refills | Status: DC

## 2020-06-10 NOTE — Progress Notes (Signed)
Sent for microscopic and culture given hematuria.

## 2020-06-10 NOTE — Patient Instructions (Addendum)
Urology should call you within 1 - 2 weeks let us know if they do not. Labs today. Start Bactrim to treat possible infection as cause  will culture urine and call if no infection.  KUB at outpatient imaging today walk in.  They will call you for a CT scan of abdomen and pelvis this week.   Hematuria, Adult Hematuria is blood in the urine. Blood may be visible in the urine, or it may be identified with a test. This condition can be caused by infections of the bladder, urethra, kidney, or prostate. Other possible causes include:  Kidney stones.  Cancer of the urinary tract.  Too much calcium in the urine.  Conditions that are passed from parent to child (inherited conditions).  Exercise that requires a lot of energy. Infections can usually be treated with medicine, and a kidney stone usually will pass through your urine. If neither of these is the cause of your hematuria, more tests may be needed to identify the cause of your symptoms. It is very important to tell your health care provider about any blood in your urine, even if it is painless or the blood stops without treatment. Blood in the urine, when it happens and then stops and then happens again, can be a symptom of a very serious condition, including cancer. There is no pain in the initial stages of many urinary cancers. Follow these instructions at home: Medicines  Take over-the-counter and prescription medicines only as told by your health care provider.  If you were prescribed an antibiotic medicine, take it as told by your health care provider. Do not stop taking the antibiotic even if you start to feel better. Eating and drinking  Drink enough fluid to keep your urine clear or pale yellow. It is recommended that you drink 3-4 quarts (2.8-3.8 L) a day. If you have been diagnosed with an infection, it is recommended that you drink cranberry juice in addition to large amounts of water.  Avoid caffeine, tea, and carbonated  beverages. These tend to irritate the bladder.  Avoid alcohol because it may irritate the prostate (men). General instructions  If you have been diagnosed with a kidney stone, follow your health care provider's instructions about straining your urine to catch the stone.  Empty your bladder often. Avoid holding urine for long periods of time.  If you are male: ? After a bowel movement, wipe from front to back and use each piece of toilet paper only once. ? Empty your bladder before and after sex.  Pay attention to any changes in your symptoms. Tell your health care provider about any changes or any new symptoms.  It is your responsibility to get your test results. Ask your health care provider, or the department performing the test, when your results will be ready.  Keep all follow-up visits as told by your health care provider. This is important. Contact a health care provider if:  You develop back pain.  You have a fever.  You have nausea or vomiting.  Your symptoms do not improve after 3 days.  Your symptoms get worse. Get help right away if:  You develop severe vomiting and are unable take medicine without vomiting.  You develop severe pain in your back or abdomen even though you are taking medicine.  You pass a large amount of blood in your urine.  You pass blood clots in your urine.  You feel very weak or like you might faint.  You faint. Summary  Hematuria is blood in the urine. It has many possible causes.  It is very important that you tell your health care provider about any blood in your urine, even if it is painless or the blood stops without treatment.  Take over-the-counter and prescription medicines only as told by your health care provider.  Drink enough fluid to keep your urine clear or pale yellow. This information is not intended to replace advice given to you by your health care provider. Make sure you discuss any questions you have with your  health care provider. Document Revised: 11/23/2018 Document Reviewed: 08/01/2016 Elsevier Patient Education  2020 [employer].

## 2020-06-10 NOTE — Progress Notes (Signed)
   Negative for urinary stone, proceed with CT when called.  ER if symptoms worsen at anytime. He should here from urology within one week.

## 2020-06-10 NOTE — Progress Notes (Signed)
Established patient visit   Patient: Jeffrey Fernandez   DOB: [birth year]   64 y.o. Male  MRN: [id number] Visit Date: 06/10/2020  Today's healthcare provider: Marcille Buffy, FNP   Chief Complaint  Patient presents with  . Hematuria   Subjective    Hematuria This is a new problem. The current episode started in the past 7 days. He describes the hematuria as gross hematuria. He is experiencing no pain. He describes his urine color as dark red. Irritative symptoms include frequency, nocturia and urgency. Obstructive symptoms include dribbling, a slower stream and a weak stream. Obstructive symptoms do not include incomplete emptying, an intermittent stream or straining. Pertinent negatives include no abdominal pain, chills, dysuria, facial swelling, fever, flank pain, genital pain, hesitancy, inability to urinate, nausea or vomiting.   Denies any dysuria.  He noticed a small amount of blood in urine in July.  He then notice a small stream of clot to it this past week.   Stream is ok.   2011 history of bladder cancer. Last urologist Dr. Dareen Piano - is now retired. Was treated with radiation,  Denies any rectal pain or pressure.  Some BPH.   01/09/2020  Upper Abdomen: No acute abnormality within the upper abdomen. Exophytic cyst is noted arising from the upper pole of left kidney measuring 1.9 cm. Incompletely characterized without IV contrast.  Musculoskeletal: No chest wall mass or suspicious bone lesions identified. Denies any abdominal or back pain.   Patient  denies any fever, body aches,chills, rash, chest pain, shortness of breath, nausea, vomiting, or diarrhea.   Denies dizziness, lightheadedness, pre syncopal or syncopal episodes.    Patient Active Problem List   Diagnosis Date Noted  . Hematuria 06/10/2020  . Aortic atherosclerosis ([place]) 12/13/2017  . Emphysema of lung ([place]) 12/13/2017  . Gastroesophageal reflux disease 11/17/2017  . Stopped smoking with  greater than 30 pack year history 03/20/2016  . Personal history of bladder cancer 03/19/2016  . Hyperlipidemia 03/19/2016  . Diverticulitis of sigmoid colon 03/19/2016  . Synovial cyst of popliteal space 03/19/2016  . Benign localized hyperplasia of prostate with urinary obstruction and other lower urinary tract symptoms (LUTS)(600.21) 03/08/2013  . 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 Essential (primary) hypertension 07/13/2002   Past Medical History:  Diagnosis Date  . Cancer [facility]) 2013   bladder  . Diverticulitis   . History of chickenpox   . History of measles   . History of mumps   . Hyperlipidemia   . Hypertension    Past Surgical History:  Procedure Laterality Date  . HERNIA REPAIR  2002,2003  . SIGMOIDOSCOPY  1998   [facility]  . TRANSURETHRAL RESECTION OF BLADDER TUMOR  05/11/2012   Dr. Sherral Hammers, [facility]  . VASECTOMY  2002   Bilateral inguinal herniorrhaphies   Social History   Tobacco Use  . Smoking status: Former Smoker    Packs/day: 2.00    Years: 39.00    Pack years: 78.00    Quit date: 07/14/2011    Years since quitting: 8.9  . Smokeless tobacco: Never Used  . Tobacco comment: smoked 1 to 1 1/2  ppd for 35 years  Substance Use Topics  . Alcohol use: Yes    Alcohol/week: 0.0 standard drinks    Comment: drinks 2 beers daily  . Drug use: No   Social History   Socioeconomic History  . Marital status: Married    Spouse name: Not on file  .  Number of children: 3  . Years of education: Not on file  . Highest education level: Not on file  Occupational History  . Occupation: Truck Geophysicist/field seismologist  Tobacco Use  . Smoking status: Former Smoker    Packs/day: 2.00    Years: 39.00    Pack years: 78.00    Quit date: 07/14/2011    Years since quitting: 8.9  . Smokeless tobacco: Never Used  . Tobacco comment: smoked 1 to 1 1/2  ppd for 35 years  Substance and Sexual Activity  . Alcohol use: Yes    Alcohol/week: 0.0 standard drinks      Comment: drinks 2 beers daily  . Drug use: No  . Sexual activity: Not on file  Other Topics Concern  . Not on file  Social History Narrative  . Not on file   Social Determinants of Health   Financial Resource Strain:   . Difficulty of Paying Living Expenses: Not on file  Food Insecurity:   . Worried About Charity fundraiser in the Last Year: Not on file  . Ran Out of Food in the Last Year: Not on file  Transportation Needs:   . Lack of Transportation (Medical): Not on file  . Lack of Transportation (Non-Medical): Not on file  Physical Activity:   . Days of Exercise per Week: Not on file  . Minutes of Exercise per Session: Not on file  Stress:   . Feeling of Stress : Not on file  Social Connections:   . Frequency of Communication with Friends and Family: Not on file  . Frequency of Social Gatherings with Friends and Family: Not on file  . Attends Religious Services: Not on file  . Active Member of Clubs or Organizations: Not on file  . Attends Archivist Meetings: Not on file  . Marital Status: Not on file  Intimate Partner Violence:   . Fear of Current or Ex-Partner: Not on file  . Emotionally Abused: Not on file  . Physically Abused: Not on file  . Sexually Abused: Not on file   Family Status  Relation Name Status  . Mother  Alive  . Father  Alive  . Sister  Alive  . Brother  Alive  . Daughter  Alive  . Son  Alive  . MGM  Deceased  . MGF  Deceased  . PGM  Deceased  . PGF  Alive  . Son  Alive   Family History  Problem Relation Age of Onset  . COPD Mother   . Diverticulosis Mother   . Diverticulosis Father   . Diabetes Father   . Hypertension Father   . Diverticulosis Sister   . Stroke Paternal Grandmother    Allergies  Allergen Reactions  . Amoxicillin Itching  . Atenolol Diarrhea    Other reaction(s): HIVES  . Other Nausea And Vomiting    Navy Beans  . Oxycodone     Other reaction(s): HIVES  . Shellfish Allergy Itching and Swelling      Last follow up was 4-5 years ago.   Medications: Outpatient Medications Prior to Visit  Medication Sig  . aspirin 81 MG tablet Take 81 mg by mouth daily.  Marland Kitchen losartan-hydrochlorothiazide (HYZAAR) 50-12.5 MG tablet Take 1 tablet by mouth daily.  . metoprolol succinate (TOPROL-XL) 25 MG 24 hr tablet Take 1 tablet (25 mg total) by mouth daily.  . Multiple Vitamins-Minerals (OCUVITE ADULT FORMULA PO) Take 1 capsule by mouth.  . pantoprazole (PROTONIX) 40 MG tablet  TAKE 1 TABLET BY MOUTH EVERY DAY   No facility-administered medications prior to visit.    Review of Systems  Constitutional: Negative for chills and fever.  HENT: Negative for facial swelling.   Gastrointestinal: Negative for abdominal pain, nausea and vomiting.  Genitourinary: Positive for frequency, hematuria, nocturia and urgency. Negative for dysuria, flank pain, hesitancy and incomplete emptying.    Last CBC Lab Results  Component Value Date   WBC 8.2 01/12/2020   HGB 17.2 01/12/2020   HCT 50.5 01/12/2020   MCV 97 01/12/2020   MCH 33.1 (H) 01/12/2020   RDW 12.8 01/12/2020   PLT 347 97/08/6376   Last metabolic panel Lab Results  Component Value Date   GLUCOSE 88 01/12/2020   NA 139 01/12/2020   K 4.6 01/12/2020   CL 100 01/12/2020   CO2 25 01/12/2020   BUN 11 01/12/2020   CREATININE 1.09 01/12/2020   GFRNONAA 72 01/12/2020   GFRAA 84 01/12/2020   CALCIUM 10.0 01/12/2020   PROT 7.4 01/12/2020   ALBUMIN 4.6 01/12/2020   LABGLOB 2.8 01/12/2020   AGRATIO 1.6 01/12/2020   BILITOT 0.7 01/12/2020   ALKPHOS 96 01/12/2020   AST 17 01/12/2020   ALT 28 01/12/2020      Objective    BP (!) 160/96   Pulse 84   Temp 98.1 F (36.7 C) (Oral)   Resp 16   Wt 197 lb 12.8 oz (89.7 kg)   SpO2 99%   BMI 28.38 kg/m  BP Readings from Last 3 Encounters:  06/10/20 (!) 160/96  01/12/20 120/72  11/25/18 (!) 153/98   Wt Readings from Last 3 Encounters:  06/10/20 197 lb 12.8 oz (89.7 kg)  01/12/20 190 lb  (86.2 kg)  01/05/20 193 lb (87.5 kg)      Physical Exam Constitutional:      General: He is not in acute distress.    Appearance: Normal appearance. He is not ill-appearing, toxic-appearing or diaphoretic.  HENT:     Head: Normocephalic and atraumatic.     Right Ear: External ear normal.     Left Ear: External ear normal.     Mouth/Throat:     Mouth: Mucous membranes are moist.  Eyes:     Conjunctiva/sclera: Conjunctivae normal.  Cardiovascular:     Rate and Rhythm: Normal rate and regular rhythm.     Pulses: Normal pulses.     Heart sounds: Normal heart sounds.  Pulmonary:     Effort: Pulmonary effort is normal.     Breath sounds: Normal breath sounds.  Abdominal:     General: There is no distension.     Palpations: Abdomen is soft. There is no mass.     Tenderness: There is no abdominal tenderness. There is no right CVA tenderness, left CVA tenderness, guarding or rebound.     Hernia: No hernia is present.     Comments: Normal exam.   Genitourinary:    Comments: Denies testicular lump or pain. Denies any rectal pain or pressure. Deferred exam.  Musculoskeletal:        General: Normal range of motion.     Cervical back: Normal range of motion and neck supple.  Lymphadenopathy:     Cervical: No cervical adenopathy.  Skin:    General: Skin is warm.     Findings: No rash.  Neurological:     Mental Status: He is alert and oriented to person, place, and time.  Psychiatric:        Mood and  Affect: Mood normal.        Behavior: Behavior normal.        Thought Content: Thought content normal.        Judgment: Judgment normal.     Results for orders placed or performed in visit on 06/10/20  POCT urinalysis dipstick  Result Value Ref Range   Color, UA yellow    Clarity, UA clear    Glucose, UA Negative Negative   Bilirubin, UA negative    Ketones, UA negative    Spec Grav, UA <=1.005 (A) 1.010 - 1.025   Blood, UA negative    pH, UA 5.0 5.0 - 8.0   Protein, UA  Negative Negative   Urobilinogen, UA 0.2 0.2 or 1.0 E.U./dL   Nitrite, UA negative    Leukocytes, UA Negative Negative   Appearance     Odor      Assessment & Plan     Hematuria, unspecified type - Plan: Urine Microscopic, Urine Culture, POCT urinalysis dipstick, CBC with Differential/Platelet, Comprehensive Metabolic Panel (CMET), INR/PT, PSA, Ambulatory referral to Urology, sulfamethoxazole-trimethoprim (BACTRIM DS) 800-160 MG tablet, DG Abd 1 View, CT Abdomen Pelvis Wo Contrast, CANCELED: CT Abdomen Pelvis Wo Contrast  Personal history of bladder cancer - Plan: Urine Microscopic, Urine Culture, POCT urinalysis dipstick, CBC with Differential/Platelet, Comprehensive Metabolic Panel (CMET), INR/PT, PSA, Ambulatory referral to Urology, DG Abd 1 View, CT Abdomen Pelvis Wo Contrast   Orders Placed This Encounter  Procedures  . Urine Culture  . DG Abd 1 View  . CT Abdomen Pelvis Wo Contrast  . Urine Microscopic  . CBC with Differential/Platelet  . Comprehensive Metabolic Panel (CMET)  . INR/PT  . PSA  . Ambulatory referral to Urology  . POCT urinalysis dipstick    KUB today to rule out large stone, however no significant pain.  Urology should call you within 1 - 2 weeks let us know if they do not. Labs today. Start Bactrim to treat possible infection as cause  will culture urine and call if no infection.  KUB at outpatient imaging today walk in.  They will call you for a CT scan of abdomen and pelvis this week.   Return in about 1 week (around 06/17/2020), or if symptoms worsen or fail to improve, for at any time for any worsening symptoms, Go to Emergency room/ urgent care if worse.     Red Flags discussed. The patient was given clear instructions to go to ER or return to medical center if any red flags develop, symptoms do not improve, worsen or new problems develop. They verbalized understanding.    Marcille Buffy, [place] [phone]  (phone) [phone] (fax)  [place]

## 2020-06-11 LAB — CBC WITH DIFFERENTIAL/PLATELET
Basophils Absolute: 0.1 10*3/uL (ref 0.0–0.2)
Basos: 2 %
EOS (ABSOLUTE): 0.2 10*3/uL (ref 0.0–0.4)
Eos: 2 %
Hematocrit: 49 % (ref 37.5–51.0)
Hemoglobin: 17.1 g/dL (ref 13.0–17.7)
Immature Grans (Abs): 0 10*3/uL (ref 0.0–0.1)
Immature Granulocytes: 0 %
Lymphocytes Absolute: 2.2 10*3/uL (ref 0.7–3.1)
Lymphs: 28 %
MCH: 32.5 pg (ref 26.6–33.0)
MCHC: 34.9 g/dL (ref 31.5–35.7)
MCV: 93 fL (ref 79–97)
Monocytes Absolute: 0.9 10*3/uL (ref 0.1–0.9)
Monocytes: 11 %
Neutrophils Absolute: 4.7 10*3/uL (ref 1.4–7.0)
Neutrophils: 57 %
Platelets: 383 10*3/uL (ref 150–450)
RBC: 5.26 x10E6/uL (ref 4.14–5.80)
RDW: 12.6 % (ref 11.6–15.4)
WBC: 8.1 10*3/uL (ref 3.4–10.8)

## 2020-06-11 LAB — COMPREHENSIVE METABOLIC PANEL
ALT: 44 IU/L (ref 0–44)
AST: 34 IU/L (ref 0–40)
Albumin/Globulin Ratio: 1.7 (ref 1.2–2.2)
Albumin: 4.7 g/dL (ref 3.8–4.8)
Alkaline Phosphatase: 109 IU/L (ref 44–121)
BUN/Creatinine Ratio: 7 — ABNORMAL LOW (ref 10–24)
BUN: 7 mg/dL — ABNORMAL LOW (ref 8–27)
Bilirubin Total: 0.5 mg/dL (ref 0.0–1.2)
CO2: 24 mmol/L (ref 20–29)
Calcium: 9.9 mg/dL (ref 8.6–10.2)
Chloride: 100 mmol/L (ref 96–106)
Creatinine, Ser: 1.03 mg/dL (ref 0.76–1.27)
GFR calc Af Amer: 90 mL/min/{1.73_m2} (ref >59)
GFR calc non Af Amer: 77 mL/min/{1.73_m2} (ref >59)
Globulin, Total: 2.7 g/dL (ref 1.5–4.5)
Glucose: 80 mg/dL (ref 65–99)
Potassium: 4.1 mmol/L (ref 3.5–5.2)
Sodium: 139 mmol/L (ref 134–144)
Total Protein: 7.4 g/dL (ref 6.0–8.5)

## 2020-06-11 LAB — URINALYSIS, MICROSCOPIC ONLY
Bacteria, UA: NONE SEEN
Casts: NONE SEEN /lpf
Epithelial Cells (non renal): NONE SEEN /hpf (ref 0–10)
RBC, Urine: NONE SEEN /hpf (ref 0–2)
WBC, UA: NONE SEEN /hpf (ref 0–5)

## 2020-06-11 LAB — PROTIME-INR
INR: 0.9 (ref 0.9–1.2)
Prothrombin Time: 9.8 s (ref 9.1–12.0)

## 2020-06-11 LAB — PSA: Prostate Specific Ag, Serum: 1.8 ng/mL (ref 0.0–4.0)

## 2020-06-11 NOTE — Progress Notes (Signed)
CBC and CMP ok. PSA within normal limits.  Clotting factors ok that were ordered.  Urine microscopic is within normal limits.  Urine culture still pending.

## 2020-06-12 LAB — URINE CULTURE: Organism ID, Bacteria: NO GROWTH

## 2020-06-12 NOTE — Progress Notes (Signed)
No growth in urine, ok to stop antibiotics.

## 2020-06-17 ENCOUNTER — Other Ambulatory Visit: Payer: Self-pay | Admitting: *Deleted

## 2020-06-17 DIAGNOSIS — R319 Hematuria, unspecified: Secondary | ICD-10-CM

## 2020-06-18 ENCOUNTER — Other Ambulatory Visit: Payer: Self-pay

## 2020-06-18 ENCOUNTER — Ambulatory Visit: Payer: BLUE CROSS/BLUE SHIELD | Admitting: Urology

## 2020-06-18 ENCOUNTER — Encounter: Payer: Self-pay | Admitting: Urology

## 2020-06-18 ENCOUNTER — Other Ambulatory Visit
Admission: RE | Admit: 2020-06-18 | Discharge: 2020-06-18 | Disposition: A | Discharge status: Home / Self Care | Payer: BLUE CROSS/BLUE SHIELD | Attending: Urology | Admitting: Urology

## 2020-06-18 ENCOUNTER — Other Ambulatory Visit: Payer: Self-pay | Admitting: Urology

## 2020-06-18 VITALS — BP 139/87 | HR 60 | Ht 70.0 in | Wt 191.0 lb

## 2020-06-18 DIAGNOSIS — R310 Gross hematuria: Secondary | ICD-10-CM

## 2020-06-18 DIAGNOSIS — R319 Hematuria, unspecified: Secondary | ICD-10-CM | POA: Insufficient documentation

## 2020-06-18 DIAGNOSIS — C679 Malignant neoplasm of bladder, unspecified: Secondary | ICD-10-CM

## 2020-06-18 DIAGNOSIS — N529 Male erectile dysfunction, unspecified: Secondary | ICD-10-CM | POA: Diagnosis not present

## 2020-06-18 DIAGNOSIS — N138 Other obstructive and reflux uropathy: Secondary | ICD-10-CM

## 2020-06-18 DIAGNOSIS — N401 Enlarged prostate with lower urinary tract symptoms: Secondary | ICD-10-CM | POA: Diagnosis not present

## 2020-06-18 LAB — URINALYSIS, COMPLETE (UACMP) WITH MICROSCOPIC
Bacteria, UA: NONE SEEN
Bilirubin Urine: NEGATIVE
Glucose, UA: NEGATIVE mg/dL
Ketones, ur: NEGATIVE mg/dL
Nitrite: NEGATIVE
Protein, ur: NEGATIVE mg/dL
Specific Gravity, Urine: 1.015 (ref 1.005–1.030)
Squamous Epithelial / HPF: NONE SEEN (ref 0–5)
pH: 7 (ref 5.0–8.0)

## 2020-06-18 MED ORDER — SILDENAFIL CITRATE 20 MG PO TABS: 20.0000 mg | ORAL_TABLET | Freq: Every day | ORAL | 11 refills | Status: DC | PRN

## 2020-06-18 NOTE — Addendum Note (Signed)
Addended by: Despina Hidden on: 06/18/2020 10:55 AM   Modules accepted: Orders

## 2020-06-18 NOTE — Progress Notes (Signed)
06/18/20 10:43 AM   Jeffrey Fernandez 05/02/1956 [id number]  CC: Gross hematuria, history of bladder cancer, ED, BPH  HPI: I saw Jeffrey Fernandez in urology clinic for the above issues.  He is a 64 year old male who reports an episode of gross hematuria with clots in late November, as well as intermittent pink urine since at least July 2021.  His history is notable for 2cm HG T1 non-muscle invasive bladder cancer diagnosed in 2013 at [facility] and treated with TURBT with postop mitomycin.  He does not think he got BCG or underwent a repeat TURBT.  He reportedly had a few follow-up surveillance cystoscopies, but last surveillance cystoscopy that I can see is April 2015 with Dr. Jacqlyn Larsen.  There is no recent cross-sectional imaging to review.  He denies any flank pain, dysuria, or weight loss.  Urine culture with PCP was negative.  He quit smoking after his bladder cancer diagnosis in 2013.  He has ED that was previously responsive to Cialis, but he had severe headaches with this medication.  He is interested in trying an alternative medication.  Regarding his urinary symptoms, these are primarily related to increased alcohol intake with urinary urgency, frequency, and weak stream when he drinks more than 3-4 beers.  He has never tried any prostate medications before.  He has nocturia 1-2 times at night.   Urinalysis today notable for 11-20 RBCs, but otherwise benign.  PSA normal at 1.8 in November 2021.  Renal function normal creatinine 1.03, EGFR greater than 60.   PMH: Past Medical History:  Diagnosis Date  . Cancer [facility]) 2013   bladder  . Diverticulitis   . History of chickenpox   . History of measles   . History of mumps   . Hyperlipidemia   . Hypertension     Surgical History: Past Surgical History:  Procedure Laterality Date  . HERNIA REPAIR  2002,2003  . SIGMOIDOSCOPY  1998   [facility]  . TRANSURETHRAL RESECTION OF BLADDER TUMOR  05/11/2012   Dr. Sherral Hammers, [facility]  .  VASECTOMY  2002   Bilateral inguinal herniorrhaphies    Family History: Family History  Problem Relation Age of Onset  . COPD Mother   . Diverticulosis Mother   . Diverticulosis Father   . Diabetes Father   . Hypertension Father   . Diverticulosis Sister   . Stroke Paternal Grandmother     Social History:  reports that he quit smoking about 8 years ago. He has a 78.00 pack-year smoking history. He has never used smokeless tobacco. He reports current alcohol use. He reports that he does not use drugs.  Physical Exam: BP 139/87   Pulse 60   Ht 5' 10"  (1.778 m)   Wt 191 lb (86.6 kg)   BMI 27.41 kg/m    Constitutional:  Alert and oriented, No acute distress. Cardiovascular: No clubbing, cyanosis, or edema. Respiratory: Normal respiratory effort, no increased work of breathing. GI: Abdomen is soft, nontender, nondistended, no abdominal masses  Laboratory Data: Reviewed, see HPI  Pertinent Imaging: CT chest June 2021 for lung cancer screening with no significant abnormalities  Assessment & Plan:   64 year old male with history of high-grade T1 bladder cancer diagnosed in 2013 and then lost to follow-up, ED, BPH, and new gross hematuria worrisome for recurrence of bladder cancer.  We discussed common possible etiologies of hematuria including BPH, malignancy, urolithiasis, medical renal disease, and idiopathic. Standard workup recommended by the AUA includes imaging with CT urogram to assess  the upper tracts, and cystoscopy. Cytology is performed on patient's with gross hematuria to look for malignant cells in the urine.  -CT hematuria tomorrow -Clinic cystoscopy next week, if obvious bladder tumor on CT, can proceed directly to the OR for -TURBT -Trial of sildenafil for ED, risks and benefits discussed Consider Flomax in the future for BPH, though suspect his urinary symptoms are primarily related to alcohol intake   I spent 60 total minutes on the day of the encounter  including pre-visit review of the medical record, face-to-face time with the patient, and post visit ordering of labs/imaging/tests.  Nickolas Madrid, MD 06/18/2020  [facility] [address], [place], [place] [address] [phone]

## 2020-06-19 ENCOUNTER — Ambulatory Visit
Admission: RE | Admit: 2020-06-19 | Discharge: 2020-06-19 | Disposition: A | Discharge status: Home / Self Care | Payer: BLUE CROSS/BLUE SHIELD | Source: Ambulatory Visit | Attending: Urology | Admitting: Urology

## 2020-06-19 ENCOUNTER — Ambulatory Visit: Payer: BLUE CROSS/BLUE SHIELD

## 2020-06-19 DIAGNOSIS — R310 Gross hematuria: Secondary | ICD-10-CM | POA: Insufficient documentation

## 2020-06-19 LAB — URINE CULTURE: Culture: NO GROWTH

## 2020-06-19 MED ORDER — IOHEXOL 300 MG/ML  SOLN
125.0000 mL | Freq: Once | INTRAMUSCULAR | Status: AC | PRN
  Administered 2020-06-19: 125 mL via INTRAVENOUS

## 2020-06-20 ENCOUNTER — Ambulatory Visit: Payer: Self-pay | Admitting: Adult Health

## 2020-06-20 ENCOUNTER — Telehealth: Payer: Self-pay

## 2020-06-20 NOTE — Telephone Encounter (Signed)
Called pt informed him of the information below. Pt gave verbal understanding.  

## 2020-06-20 NOTE — Telephone Encounter (Signed)
-----   Message from Billey Co, MD sent at 06/20/2020 11:07 AM EST ----- Good news, CT normal, keep follow up next week for cysto to rule out bladder cancer recurrence  Nickolas Madrid, MD 06/20/2020

## 2020-06-26 ENCOUNTER — Other Ambulatory Visit: Payer: Self-pay | Admitting: Urology

## 2020-06-26 ENCOUNTER — Encounter: Payer: Self-pay | Admitting: Urology

## 2020-06-26 ENCOUNTER — Encounter: Payer: Self-pay | Admitting: Family Medicine

## 2020-06-26 ENCOUNTER — Other Ambulatory Visit: Payer: Self-pay

## 2020-06-26 ENCOUNTER — Ambulatory Visit: Payer: BLUE CROSS/BLUE SHIELD | Admitting: Urology

## 2020-06-26 VITALS — BP 146/96 | HR 65 | Ht 70.0 in | Wt 197.0 lb

## 2020-06-26 DIAGNOSIS — N400 Enlarged prostate without lower urinary tract symptoms: Secondary | ICD-10-CM | POA: Insufficient documentation

## 2020-06-26 DIAGNOSIS — Z8551 Personal history of malignant neoplasm of bladder: Secondary | ICD-10-CM

## 2020-06-26 DIAGNOSIS — R310 Gross hematuria: Secondary | ICD-10-CM | POA: Diagnosis not present

## 2020-06-26 DIAGNOSIS — N138 Other obstructive and reflux uropathy: Secondary | ICD-10-CM

## 2020-06-26 NOTE — Progress Notes (Signed)
Cystoscopy Procedure Note:  Indication: Gross hematuria, history of HG T1 bladder cancer 2013  After informed consent and discussion of the procedure and its risks, BONHAM ZINGALE was positioned and prepped in the standard fashion. Cystoscopy was performed with a flexible cystoscope. The urethra, bladder neck and entire bladder was visualized in a standard fashion. The prostate was large with obstructing lateral lobes. The ureteral orifices were visualized in their normal location and orientation.  Mild bladder trabeculations throughout, no suspicious lesions.  Intravesical protrusion of the prostate on retroflexion with some friable vessels.  Cytology was sent  Imaging: I personally reviewed the CT urogram showing no evidence of recurrence, renal masses, stones, or filling defects.  Prostate volume 67 g  Findings: No evidence of bladder tumor recurrence, large prostate  Assessment and Plan: No evidence of bladder cancer recurrence.  He has mild BPH symptoms of nocturia and some weak stream.  He is interested in trying Flomax.  Risks and benefits discussed at length.  We also briefly reviewed HOLEP as a treatment option if he has recurrence of his gross hematuria or worsening urinary symptoms.  Trial of flomax for BPH symptoms RTC 6 months symptom check  Nickolas Madrid, MD 06/26/2020

## 2020-06-26 NOTE — Patient Instructions (Signed)
Benign Prostatic Hyperplasia  Benign prostatic hyperplasia (BPH) is an enlarged prostate gland that is caused by the normal aging process and not by cancer. The prostate is a walnut-sized gland that is involved in the production of semen. It is located in front of the rectum and below the bladder. The bladder stores urine and the urethra is the tube that carries the urine out of the body. The prostate may get bigger as a man gets older. An enlarged prostate can press on the urethra. This can make it harder to pass urine. The build-up of urine in the bladder can cause infection. Back pressure and infection may progress to bladder damage and kidney (renal) failure. What are the causes? This condition is part of a normal aging process. However, not all men develop problems from this condition. If the prostate enlarges away from the urethra, urine flow will not be blocked. If it enlarges toward the urethra and compresses it, there will be problems passing urine. What increases the risk? This condition is more likely to develop in men over the age of 50 years. What are the signs or symptoms? Symptoms of this condition include:  Getting up often during the night to urinate.  Needing to urinate frequently during the day.  Difficulty starting urine flow.  Decrease in size and strength of your urine stream.  Leaking (dribbling) after urinating.  Inability to pass urine. This needs immediate treatment.  Inability to completely empty your bladder.  Pain when you pass urine. This is more common if there is also an infection.  Urinary tract infection (UTI). How is this diagnosed? This condition is diagnosed based on your medical history, a physical exam, and your symptoms. Tests will also be done, such as:  A post-void bladder scan. This measures any amount of urine that may remain in your bladder after you finish urinating.  A digital rectal exam. In a rectal exam, your health care provider  checks your prostate by putting a lubricated, gloved finger into your rectum to feel the back of your prostate gland. This exam detects the size of your gland and any abnormal lumps or growths.  An exam of your urine (urinalysis).  A prostate specific antigen (PSA) screening. This is a blood test used to screen for prostate cancer.  An ultrasound. This test uses sound waves to electronically produce a picture of your prostate gland. Your health care provider may refer you to a specialist in kidney and prostate diseases (urologist). How is this treated? Once symptoms begin, your health care provider will monitor your condition (active surveillance or watchful waiting). Treatment for this condition will depend on the severity of your condition. Treatment may include:  Observation and yearly exams. This may be the only treatment needed if your condition and symptoms are mild.  Medicines to relieve your symptoms, including: ? Medicines to shrink the prostate. ? Medicines to relax the muscle of the prostate.  Surgery in severe cases. Surgery may include: ? Prostatectomy. In this procedure, the prostate tissue is removed completely through an open incision or with a laparoscope or robotics. ? Transurethral resection of the prostate (TURP). In this procedure, a tool is inserted through the opening at the tip of the penis (urethra). It is used to cut away tissue of the inner core of the prostate. The pieces are removed through the same opening of the penis. This removes the blockage. ? Transurethral incision (TUIP). In this procedure, small cuts are made in the prostate. This lessens   the prostate's pressure on the urethra. ? Transurethral microwave thermotherapy (TUMT). This procedure uses microwaves to create heat. The heat destroys and removes a small amount of prostate tissue. ? Transurethral needle ablation (TUNA). This procedure uses radio frequencies to destroy and remove a small amount of  prostate tissue. ? Interstitial laser coagulation (ILC). This procedure uses a laser to destroy and remove a small amount of prostate tissue. ? Transurethral electrovaporization (TUVP). This procedure uses electrodes to destroy and remove a small amount of prostate tissue. ? Prostatic urethral lift. This procedure inserts an implant to push the lobes of the prostate away from the urethra. Follow these instructions at home:  Take over-the-counter and prescription medicines only as told by your health care provider.  Monitor your symptoms for any changes. Contact your health care provider with any changes.  Avoid drinking large amounts of liquid before going to bed or out in public.  Avoid or reduce how much caffeine or alcohol you drink.  Give yourself time when you urinate.  Keep all follow-up visits as told by your health care provider. This is important. Contact a health care provider if:  You have unexplained back pain.  Your symptoms do not get better with treatment.  You develop side effects from the medicine you are taking.  Your urine becomes very dark or has a bad smell.  Your lower abdomen becomes distended and you have trouble passing your urine. Get help right away if:  You have a fever or chills.  You suddenly cannot urinate.  You feel lightheaded, or very dizzy, or you faint.  There are large amounts of blood or clots in the urine.  Your urinary problems become hard to manage.  You develop moderate to severe low back or flank pain. The flank is the side of your body between the ribs and the hip. These symptoms may represent a serious problem that is an emergency. Do not wait to see if the symptoms will go away. Get medical help right away. Call your local emergency services (911 in the [place] Do not drive yourself to the hospital. Summary  Benign prostatic hyperplasia (BPH) is an enlarged prostate that is caused by the normal aging process and not by  cancer.  An enlarged prostate can press on the urethra. This can make it hard to pass urine.  This condition is part of a normal aging process and is more likely to develop in men over the age of 50 years.  Get help right away if you suddenly cannot urinate. This information is not intended to replace advice given to you by your health care provider. Make sure you discuss any questions you have with your health care provider. Document Revised: 05/24/2018 Document Reviewed: 08/03/2016 Elsevier Patient Education  2020 [employer]   Holmium Laser Enucleation of the Prostate (HoLEP)  HoLEP is a treatment for men with benign prostatic hyperplasia (BPH). The laser surgery removed blockages of urine flow, and is done without any incisions on the body.     What is HoLEP?  HoLEP is a type of laser surgery used to treat obstruction (blockage) of urine flow as a result of benign prostatic hyperplasia (BPH). In men with BPH, the prostate gland is not cancerous, but has become enlarged. An enlarged prostate can result in a number of urinary tract symptoms such as weak urinary stream, difficulty in starting urination, inability to urinate, frequent urination, or getting up at night to urinate.  HoLEP was developed in the 1990's   as a more effective and less expensive surgical option for BPH, compared to other surgical options such as laser vaporization(PVP/greenlight laser), transurethral resection of the prostate(TURP), and open simple prostatectomy.   What happens during a HoLEP?  HoLEP requires general anesthesia ("asleep" throughout the procedure).   An antibiotic is given to reduce the risk of infection  A surgical instrument called a resectoscope is inserted through the urethra (the tube that carries urine from the bladder). The resectoscope has a camera that allows the surgeon to view the internal structure of the prostate gland, and to see where the incisions are being made during  surgery.  The laser is inserted into the resectoscope and is used to enucleate (free up) the enlarged prostate tissue from the capsule (outer shell) and then to seal up any blood vessels. The tissue that has been removed is pushed back into the bladder.  A morcellator is placed through the resectoscope, and is used to suction out the prostate tissue that has been pushed into the bladder.  When the prostate tissue has been removed, the resectoscope is removed, and a foley catheter is placed to allow healing and drain the urine from the bladder.     What happens after a HoLEP?  More than 90% of patients go home the same day a few hours after surgery. Less than 10% will be admitted to the hospital overnight for observation to monitor the urine, or if they have other medical problems.  Fluid is flushed through the catheter for about 1 hour after surgery to clear any blood from the urine. It is normal to have some blood in the urine after surgery. The need for blood transfusion is extremely rare.  Eating and drinking are permitted after the procedure once the patient has fully awakened from anesthesia.  The catheter is usually removed 2-3 days after surgery- the patient will come to clinic to have the catheter removed and make sure they can urinate on their own.  It is very important to drink lots of fluids after surgery for one week to keep the bladder flushed.  At first, there may be some burning with urination, but this typically improved within a few hours to days. Most patients do not have a significant amount of pain, and narcotic pain medications are rarely needed.  Symptoms of urinary frequency, urgency, and even leakage are NORMAL for the first few weeks after surgery as the bladder adjusts after having to work hard against blockage from the prostate for many years. This will improve, but can sometimes take several months.  The use of pelvic floor exercises (Kegel exercises) can help  improve problems with urinary incontinence.   After catheter removal, patients will be seen at 6 weeks and 6 months for symptom check  No heavy lifting for at least 2-3 weeks after surgery, however patients can walk and do light activities the first day after surgery. Return to work time depends on occupation.    What are the advantages of HoLEP?  HoLEP has been studied in many different parts of the world and has been shown to be a safe and effective procedure. Although there are many types of BPH surgeries available, HoLEP offers a unique advantage in being able to remove a large amount of tissue without any incisions on the body, even in very large prostates, while decreasing the risk of bleeding and providing tissue for pathology (to look for cancer). This decreases the need for blood transfusions during surgery, minimizes hospital stay,   and reduces the risk of needing repeat treatment.  What are the side effects of HoLEP?  Temporary burning and bleeding during urination. Some blood may be seen in the urine for weeks after surgery and is part of the healing process.  Urinary incontinence (inability to control urine flow) is expected in all patients immediately after surgery and they should wear pads for the first few days/weeks. This typically improves over the course of several weeks. Performing Kegel exercises can help decrease leakage from stress maneuvers such as coughing, sneezing, or lifting. The rate of long term leakage is very low. Patients may also have leakage with urgency and this may be treated with medication. The risk of urge incontinence can be dependent on several factors including age, prostate size, symptoms, and other medical problems.  Retrograde ejaculation or "backwards ejaculation." In 75% of cases, the patient will not see any fluid during ejaculation after surgery.  Erectile function is generally not significantly affected.   What are the risks of HoLEP?  Injury  to the urethra or development of scar tissue at a later date  Injury to the capsule of the prostate (typically treated with longer catheterization).  Injury to the bladder or ureteral orifices (where the urine from the kidney drains out)  Infection of the bladder, testes, or kidneys  Return of urinary obstruction at a later date requiring another operation (<2%)  Need for blood transfusion or re-operation due to bleeding  Failure to relieve all symptoms and/or need for prolonged catheterization after surgery  5-15% of patients are found to have previously undiagnosed prostate cancer in their specimen. Prostate cancer can be treated after HoLEP.  Standard risks of anesthesia including blood clots, heart attacks, etc  When should I call my doctor?  Fever over 101.3 degrees  Inability to urinate, or large blood clots in the urine   

## 2020-06-26 NOTE — Addendum Note (Signed)
Addended by: Despina Hidden on: 06/26/2020 01:38 PM   Modules accepted: Orders

## 2020-06-28 LAB — CYTOLOGY - NON PAP

## 2020-07-01 ENCOUNTER — Other Ambulatory Visit: Payer: Self-pay | Admitting: Family Medicine

## 2020-07-01 DIAGNOSIS — K219 Gastro-esophageal reflux disease without esophagitis: Secondary | ICD-10-CM

## 2020-07-02 ENCOUNTER — Telehealth: Payer: Self-pay

## 2020-07-02 DIAGNOSIS — N138 Other obstructive and reflux uropathy: Secondary | ICD-10-CM

## 2020-07-02 MED ORDER — TAMSULOSIN HCL 0.4 MG PO CAPS: 0.4000 mg | ORAL_CAPSULE | Freq: Every day | ORAL | 5 refills | Status: DC

## 2020-07-02 NOTE — Telephone Encounter (Signed)
-----   Message from Billey Co, MD sent at 07/01/2020  3:04 PM EST ----- No cancer cells on cytology, keep follow-up as scheduled  Nickolas Madrid, MD 07/01/2020

## 2020-07-02 NOTE — Telephone Encounter (Signed)
Called pt informed him of the information below. Pt gave verbal understanding. Pt also inquires about trial of Flomax, per Dr. Doristine Counter last note pt to begin trial of Flomax. RX sent in.

## 2020-10-28 ENCOUNTER — Ambulatory Visit (INDEPENDENT_AMBULATORY_CARE_PROVIDER_SITE_OTHER): Payer: BLUE CROSS/BLUE SHIELD

## 2020-10-28 ENCOUNTER — Ambulatory Visit
Admission: EM | Admit: 2020-10-28 | Discharge: 2020-10-28 | Disposition: A | Discharge status: Home / Self Care | Payer: BLUE CROSS/BLUE SHIELD | Attending: Family Medicine | Admitting: Family Medicine

## 2020-10-28 ENCOUNTER — Other Ambulatory Visit: Payer: Self-pay

## 2020-10-28 ENCOUNTER — Encounter: Payer: Self-pay | Admitting: Emergency Medicine

## 2020-10-28 DIAGNOSIS — R509 Fever, unspecified: Secondary | ICD-10-CM

## 2020-10-28 DIAGNOSIS — M2550 Pain in unspecified joint: Secondary | ICD-10-CM | POA: Diagnosis not present

## 2020-10-28 DIAGNOSIS — Z20822 Contact with and (suspected) exposure to covid-19: Secondary | ICD-10-CM | POA: Diagnosis not present

## 2020-10-28 DIAGNOSIS — R5383 Other fatigue: Secondary | ICD-10-CM | POA: Insufficient documentation

## 2020-10-28 DIAGNOSIS — J101 Influenza due to other identified influenza virus with other respiratory manifestations: Secondary | ICD-10-CM | POA: Insufficient documentation

## 2020-10-28 DIAGNOSIS — Z87891 Personal history of nicotine dependence: Secondary | ICD-10-CM | POA: Insufficient documentation

## 2020-10-28 DIAGNOSIS — R0981 Nasal congestion: Secondary | ICD-10-CM | POA: Insufficient documentation

## 2020-10-28 DIAGNOSIS — R059 Cough, unspecified: Secondary | ICD-10-CM | POA: Diagnosis not present

## 2020-10-28 LAB — RESP PANEL BY RT-PCR (FLU A&B, COVID) ARPGX2
Influenza A by PCR: POSITIVE — AB
Influenza B by PCR: NEGATIVE
SARS Coronavirus 2 by RT PCR: NEGATIVE

## 2020-10-28 MED ORDER — XOFLUZA (80 MG DOSE) 2 X 40 MG PO TBPK: 80.0000 mg | ORAL_TABLET | Freq: Once | ORAL | 0 refills | Status: AC

## 2020-10-28 MED ORDER — NAPROXEN 500 MG PO TABS: 500.0000 mg | ORAL_TABLET | Freq: Two times a day (BID) | ORAL | 0 refills | Status: DC | PRN

## 2020-10-28 NOTE — ED Triage Notes (Signed)
Patient c/o cough and nasal congestion x 3 weeks. He states last week he was bit by 5 ticks. He states last night he started having joint pain and a fever of 104.0.

## 2020-10-28 NOTE — ED Provider Notes (Signed)
[facility]    CSN: [id number] Arrival date & time: 10/28/20  4403      History   Chief Complaint Chief Complaint  Patient presents with  . Cough  . Nasal Congestion  . Fever  . Joint Pain   HPI   65 year old male presents with the above complaints.  Patient reports he has been sick for 3 weeks.  He states that he has had respiratory symptoms.  He has had cough and congestion.  He states that he seemed to be improving late last week but worsened on Saturday.  He states that on Saturday he developed fatigue and body aches.  Also developed fever, T-max 104.  He feels very poorly at this time.  Currently febrile at 101.5.  Additionally, patient reports that last week he got bit by several deer ticks.  He is unsure if this is playing a role.  No relieving factors.  No reported sick contacts.  No other complaints.  Past Medical History:  Diagnosis Date  . Cancer [facility]) 2013   bladder  . Diverticulitis   . History of chickenpox   . History of measles   . History of mumps   . Hyperlipidemia   . Hypertension     Patient Active Problem List   Diagnosis Date Noted  . Enlarged prostate 06/26/2020  . Hematuria 06/10/2020  . Aortic atherosclerosis ([place]) 12/13/2017  . Emphysema of lung ([place]) 12/13/2017  . Gastroesophageal reflux disease 11/17/2017  . Stopped smoking with greater than 30 pack year history 03/20/2016  . Personal history of bladder cancer 03/19/2016  . Hyperlipidemia 03/19/2016  . Diverticulitis of sigmoid colon 03/19/2016  . Synovial cyst of popliteal space 03/19/2016  . Benign localized hyperplasia of prostate with urinary obstruction and other lower urinary tract symptoms (LUTS)(600.21) 03/08/2013  . 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 Essential (primary) hypertension 07/13/2002    Past Surgical History:  Procedure Laterality Date  . HERNIA REPAIR  2002,2003  . SIGMOIDOSCOPY  1998   [facility]  .  TRANSURETHRAL RESECTION OF BLADDER TUMOR  05/11/2012   Dr. Sherral Hammers, [facility]  . VASECTOMY  2002   Bilateral inguinal herniorrhaphies       Home Medications    Prior to Admission medications   Medication Sig Start Date End Date Taking? Authorizing Provider  aspirin 81 MG tablet Take 81 mg by mouth daily.   Yes [provider]  Baloxavir Marboxil,80 MG Dose, (XOFLUZA, 80 MG DOSE,) 2 x 40 MG TBPK Take 80 mg by mouth once for 1 dose. 10/28/20 10/28/20 Yes Takima Encina G, DO  losartan-hydrochlorothiazide (HYZAAR) 50-12.5 MG tablet Take 1 tablet by mouth daily. 01/22/20  Yes Birdie Sons, MD  metoprolol succinate (TOPROL-XL) 25 MG 24 hr tablet Take 1 tablet (25 mg total) by mouth daily. 01/22/20  Yes Birdie Sons, MD  Multiple Vitamins-Minerals (OCUVITE ADULT FORMULA PO) Take 1 capsule by mouth.   Yes [provider]  naproxen (NAPROSYN) 500 MG tablet Take 1 tablet (500 mg total) by mouth 2 (two) times daily as needed for moderate pain or headache. 10/28/20  Yes Chanel Mckesson G, DO  pantoprazole (PROTONIX) 40 MG tablet TAKE 1 TABLET BY MOUTH EVERY DAY 07/01/20  Yes Birdie Sons, MD  sildenafil (REVATIO) 20 MG tablet Take 1-5 tablets (20-100 mg total) by mouth daily as needed. 06/18/20  Yes Billey Co, MD  tamsulosin (FLOMAX) 0.4 MG CAPS capsule Take 1 capsule (0.4 mg  total) by mouth daily after supper. 07/02/20  Yes Billey Co, MD    Family History Family History  Problem Relation Age of Onset  . COPD Mother   . Diverticulosis Mother   . Diverticulosis Father   . Diabetes Father   . Hypertension Father   . Diverticulosis Sister   . Stroke Paternal Grandmother     Social History Social History   Tobacco Use  . Smoking status: Former Smoker    Packs/day: 2.00    Years: 39.00    Pack years: 78.00    Quit date: 07/14/2011    Years since quitting: 9.2  . Smokeless tobacco: Never Used  . Tobacco comment: smoked 1 to 1 1/2  ppd for 35 years  Substance Use  Topics  . Alcohol use: Yes    Alcohol/week: 0.0 standard drinks    Comment: drinks 2 beers daily  . Drug use: No     Allergies   Amoxicillin, Atenolol, Other, Oxycodone, and Shellfish allergy   Review of Systems Review of Systems Per HPI  Physical Exam Triage Vital Signs ED Triage Vitals  Enc Vitals Group     BP 10/28/20 0930 (!) 139/93     Pulse Rate 10/28/20 0930 (!) 109     Resp 10/28/20 0930 18     Temp 10/28/20 0930 (!) 101.5 F (38.6 C)     Temp src --      SpO2 10/28/20 0930 98 %     Weight 10/28/20 0928 195 lb (88.5 kg)     Height 10/28/20 0928 5\' 9"  (1.753 m)     Head Circumference --      Peak Flow --      Pain Score 10/28/20 0928 5     Pain Loc --      Pain Edu? --      Excl. in [place]? --    Updated Vital Signs BP (!) 139/93 (BP Location: Left Arm)   Pulse (!) 109   Temp (!) 101.5 F (38.6 C)   Resp 18   Ht 5\' 9"  (1.753 m)   Wt 88.5 kg   SpO2 98%   BMI 28.80 kg/m   Visual Acuity Right Eye Distance:   Left Eye Distance:   Bilateral Distance:    Right Eye Near:   Left Eye Near:    Bilateral Near:     Physical Exam Constitutional:      General: He is not in acute distress.    Appearance: Normal appearance. He is not ill-appearing.  HENT:     Head: Normocephalic and atraumatic.     Mouth/Throat:     Pharynx: Oropharynx is clear.  Eyes:     General:        Right eye: No discharge.        Left eye: No discharge.     Conjunctiva/sclera: Conjunctivae normal.  Cardiovascular:     Rate and Rhythm: Regular rhythm. Tachycardia present.  Pulmonary:     Effort: Pulmonary effort is normal.     Breath sounds: Rales present.  Neurological:     Mental Status: He is alert.  Psychiatric:        Mood and Affect: Mood normal.        Behavior: Behavior normal.    UC Treatments / Results  Labs (all labs ordered are listed, but only abnormal results are displayed) Labs Reviewed  RESP PANEL BY RT-PCR (FLU A&B, COVID) ARPGX2 - Abnormal; Notable for  the following components:  Result Value   Influenza A by PCR POSITIVE (*)    All other components within normal limits    EKG   Radiology DG Chest 2 View  Result Date: 10/28/2020 CLINICAL DATA:  Cough for 3 weeks.  Fever for 2 days. EXAM: CHEST - 2 VIEW COMPARISON:  03/04/2005 FINDINGS: The heart size and mediastinal contours are within normal limits. Both lungs are clear. The visualized skeletal structures are unremarkable. IMPRESSION: No active cardiopulmonary disease. Electronically Signed   By: Marlaine Hind M.D.   On: 10/28/2020 10:04    Procedures Procedures (including critical care time)  Medications Ordered in UC Medications - No data to display  Initial Impression / Assessment and Plan / UC Course  I have reviewed the triage vital signs and the nursing notes.  Pertinent labs & imaging results that were available during my care of the patient were reviewed by me and considered in my medical decision making (see chart for details).    65 year old male presents with an acute febrile illness.  Chest x-ray was obtained initially and was intimately reviewed by me.  Interpretation: Normal chest x-ray.  Covid testing was negative.  Patient tested positive for influenza A.  Treating with Xofluza and naproxen.  Final Clinical Impressions(s) / UC Diagnoses   Final diagnoses:  Influenza A   Discharge Instructions   None    ED Prescriptions    Medication Sig Dispense Auth. Provider   Baloxavir Marboxil,80 MG Dose, (XOFLUZA, 80 MG DOSE,) 2 x 40 MG TBPK Take 80 mg by mouth once for 1 dose. 2 each Coral Spikes, DO   naproxen (NAPROSYN) 500 MG tablet Take 1 tablet (500 mg total) by mouth 2 (two) times daily as needed for moderate pain or headache. 30 tablet Coral Spikes, DO     PDMP not reviewed this encounter.   Coral Spikes, DO 10/28/20 1057

## 2020-11-01 ENCOUNTER — Ambulatory Visit (INDEPENDENT_AMBULATORY_CARE_PROVIDER_SITE_OTHER): Payer: BLUE CROSS/BLUE SHIELD

## 2020-11-01 ENCOUNTER — Other Ambulatory Visit: Payer: Self-pay

## 2020-11-01 ENCOUNTER — Ambulatory Visit
Admission: EM | Admit: 2020-11-01 | Discharge: 2020-11-01 | Disposition: A | Discharge status: Home / Self Care | Payer: BLUE CROSS/BLUE SHIELD | Attending: Emergency Medicine | Admitting: Emergency Medicine

## 2020-11-01 DIAGNOSIS — R059 Cough, unspecified: Secondary | ICD-10-CM

## 2020-11-01 DIAGNOSIS — J101 Influenza due to other identified influenza virus with other respiratory manifestations: Secondary | ICD-10-CM | POA: Diagnosis not present

## 2020-11-01 DIAGNOSIS — B370 Candidal stomatitis: Secondary | ICD-10-CM

## 2020-11-01 DIAGNOSIS — R079 Chest pain, unspecified: Secondary | ICD-10-CM

## 2020-11-01 MED ORDER — PREDNISONE 20 MG PO TABS: 40.0000 mg | ORAL_TABLET | Freq: Every day | ORAL | 0 refills | Status: AC

## 2020-11-01 MED ORDER — BENZONATATE 100 MG PO CAPS: 100.0000 mg | ORAL_CAPSULE | Freq: Three times a day (TID) | ORAL | 0 refills | Status: DC | PRN

## 2020-11-01 MED ORDER — NYSTATIN 100000 UNIT/ML MT SUSP: 500000.0000 [IU] | Freq: Four times a day (QID) | OROMUCOSAL | 0 refills | Status: DC

## 2020-11-01 NOTE — Discharge Instructions (Signed)
Nystatin, swish and spit 4 times a day to treat thrush.  I am hopeful that the prednisone helps with your cough and pain.  Tessalon may help some with cough as well.  Limit use of the chest brace.  If symptoms worsen or do not improve in the next week to return to be seen or to follow up with your PCP.

## 2020-11-01 NOTE — ED Provider Notes (Signed)
[facility]    CSN: [id number] Arrival date & time: 11/01/20  1532      History   Chief Complaint Chief Complaint  Patient presents with  . Cough  . Thrush    HPI Jeffrey Fernandez is a 65 y.o. male.   Jeffrey Fernandez presents with complaints of cough which is maybe worse, but is causing increased chest wall pain, as well as oral pain and concern for thrush. Was seen on 4/18 and positive for influenza a. He states that he has been suffering with a cough for upwards of 4 weeks, however, which he attributing to seasonal allergies as well. He was unable to start xofluza. Chest xray on 4/18 was normal. He has a sharp pain with coughing to left lower lung field. 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 He removes it when he isn't driving. Doesn't feel shortness of breath. No leg pain or swelling. Congestion. Denies any wheezing. History of smoking, doesn't actively smoke. No history of asthma or COPD.    ROS per HPI, negative if not otherwise mentioned.      Past Medical History:  Diagnosis Date  . Cancer [facility]) 2013   bladder  . Diverticulitis   . History of chickenpox   . History of measles   . History of mumps   . Hyperlipidemia   . Hypertension     Patient Active Problem List   Diagnosis Date Noted  . Enlarged prostate 06/26/2020  . Hematuria 06/10/2020  . Aortic atherosclerosis ([place]) 12/13/2017  . Emphysema of lung ([place]) 12/13/2017  . Gastroesophageal reflux disease 11/17/2017  . Stopped smoking with greater than 30 pack year history 03/20/2016  . Personal history of bladder cancer 03/19/2016  . Hyperlipidemia 03/19/2016  . Diverticulitis of sigmoid colon 03/19/2016  . Synovial cyst of popliteal space 03/19/2016  . Benign localized hyperplasia of prostate with urinary obstruction and other lower urinary tract symptoms (LUTS)(600.21) 03/08/2013  . ED (erectile dysfunction) of organic origin  02/16/2009  . Essential (primary) hypertension 07/13/2002    Past Surgical History:  Procedure Laterality Date  . HERNIA REPAIR  2002,2003  . SIGMOIDOSCOPY  1998   [facility]  . TRANSURETHRAL RESECTION OF BLADDER TUMOR  05/11/2012   Dr. Sherral Hammers, [facility]  . VASECTOMY  2002   Bilateral inguinal herniorrhaphies       Home Medications    Prior to Admission medications   Medication Sig Start Date End Date Taking? Authorizing Provider  aspirin 81 MG tablet Take 81 mg by mouth daily.   Yes [provider]  benzonatate (TESSALON) 100 MG capsule Take 1-2 capsules (100-200 mg total) by mouth 3 (three) times daily as needed for cough. 11/01/20  Yes Jhaden Pizzuto, Lanelle Bal B, NP  losartan-hydrochlorothiazide (HYZAAR) 50-12.5 MG tablet Take 1 tablet by mouth daily. 01/22/20  Yes Birdie Sons, MD  metoprolol succinate (TOPROL-XL) 25 MG 24 hr tablet Take 1 tablet (25 mg total) by mouth daily. 01/22/20  Yes Birdie Sons, MD  Multiple Vitamins-Minerals (OCUVITE ADULT FORMULA PO) Take 1 capsule by mouth.   Yes [provider]  naproxen (NAPROSYN) 500 MG tablet Take 1 tablet (500 mg total) by mouth 2 (two) times daily as needed for moderate pain or headache. 10/28/20  Yes Thersa Salt G, DO  nystatin (MYCOSTATIN) 100000 UNIT/ML suspension Take 5 mLs (500,000 Units total) by mouth 4 (four) times daily. Swish and  spit 11/01/20  Yes Nithya Meriweather, Lanelle Bal B, NP  pantoprazole (PROTONIX) 40 MG tablet TAKE 1 TABLET BY MOUTH EVERY DAY 07/01/20  Yes Birdie Sons, MD  predniSONE (DELTASONE) 20 MG tablet Take 2 tablets (40 mg total) by mouth daily with breakfast for 5 days. 11/01/20 11/06/20 Yes Rosiland Sen, Lanelle Bal B, NP  sildenafil (REVATIO) 20 MG tablet Take 1-5 tablets (20-100 mg total) by mouth daily as needed. 06/18/20  Yes Billey Co, MD  tamsulosin (FLOMAX) 0.4 MG CAPS capsule Take 1 capsule (0.4 mg total) by mouth daily after supper. 07/02/20  Yes Billey Co, MD    Family  History Family History  Problem Relation Age of Onset  . COPD Mother   . Diverticulosis Mother   . Diverticulosis Father   . Diabetes Father   . Hypertension Father   . Diverticulosis Sister   . Stroke Paternal Grandmother     Social History Social History   Tobacco Use  . Smoking status: Former Smoker    Packs/day: 2.00    Years: 39.00    Pack years: 78.00    Quit date: 07/14/2011    Years since quitting: 9.3  . Smokeless tobacco: Never Used  . Tobacco comment: smoked 1 to 1 1/2  ppd for 35 years  Vaping Use  . Vaping Use: Never used  Substance Use Topics  . Alcohol use: Yes    Alcohol/week: 0.0 standard drinks    Comment: drinks 2 beers daily  . Drug use: No     Allergies   Amoxicillin, Atenolol, Other, Oxycodone, and Shellfish allergy   Review of Systems Review of Systems   Physical Exam Triage Vital Signs ED Triage Vitals  Enc Vitals Group     BP 11/01/20 1600 128/84     Pulse Rate 11/01/20 1600 73     Resp 11/01/20 1600 18     Temp 11/01/20 1600 98.6 F (37 C)     Temp Source 11/01/20 1600 Oral     SpO2 11/01/20 1600 97 %     Weight 11/01/20 1558 195 lb (88.5 kg)     Height 11/01/20 1558 5\' 9"  (1.753 m)     Head Circumference --      Peak Flow --      Pain Score 11/01/20 1557 4     Pain Loc --      Pain Edu? --      Excl. in [place]? --    No data found.  Updated Vital Signs BP 128/84 (BP Location: Left Arm)   Pulse 73   Temp 98.6 F (37 C) (Oral)   Resp 18   Ht 5\' 9"  (1.753 m)   Wt 195 lb (88.5 kg)   SpO2 97%   BMI 28.80 kg/m    Physical Exam Constitutional:      Appearance: He is well-developed.  HENT:     Head:     Comments: Upper gums with white plaques noted and tenderness - dentures not in place  Cardiovascular:     Rate and Rhythm: Normal rate.  Pulmonary:     Effort: Pulmonary effort is normal.     Comments: Crackles to bilateral bases Chest:     Chest wall: No tenderness.     Comments: Left lower chest pain, triggered  with cough, improves with bracing; not reproducible with palpation  Skin:    General: Skin is warm and dry.  Neurological:     Mental Status: He is alert and oriented to person,  place, and time.      UC Treatments / Results  Labs (all labs ordered are listed, but only abnormal results are displayed) Labs Reviewed - No data to display  EKG   Radiology DG Chest 2 View  Result Date: 11/01/2020 CLINICAL DATA:  Worsening left lower chest pain with cough EXAM: CHEST - 2 VIEW COMPARISON:  October 28, 2020 FINDINGS: The heart size and mediastinal contours are within normal limits. No focal consolidation. No pleural effusion. No pneumothorax. The visualized skeletal structures are unremarkable. IMPRESSION: No active cardiopulmonary disease. Electronically Signed   By: Dahlia Bailiff MD   On: 11/01/2020 17:10    Procedures Procedures (including critical care time)  Medications Ordered in UC Medications - No data to display  Initial Impression / Assessment and Plan / UC Course  I have reviewed the triage vital signs and the nursing notes.  Pertinent labs & imaging results that were available during my care of the patient were reviewed by me and considered in my medical decision making (see chart for details).     Stomatitis to upper gums now that dentures are out and have been for a few days, with nystatin provided. Chest xray remains clear today which is reassuring and vitals look well. Chest wall strain related to coughing consistent with exam with pain management discussed. Prednisone provided and strict return precautions discussed. Encouraged to limit chest binding. Patient verbalized understanding and agreeable to plan.   Final Clinical Impressions(s) / UC Diagnoses   Final diagnoses:  Influenza A  Cough  Chest pain, unspecified type  Oral thrush     Discharge Instructions     Nystatin, swish and spit 4 times a day to treat thrush.  I am hopeful that the prednisone helps  with your cough and pain.  Tessalon may help some with cough as well.  Limit use of the chest brace.  If symptoms worsen or do not improve in the next week to return to be seen or to follow up with your PCP.     ED Prescriptions    Medication Sig Dispense Auth. Provider   predniSONE (DELTASONE) 20 MG tablet Take 2 tablets (40 mg total) by mouth daily with breakfast for 5 days. 10 tablet Augusto Gamble B, NP   nystatin (MYCOSTATIN) 100000 UNIT/ML suspension Take 5 mLs (500,000 Units total) by mouth 4 (four) times daily. Swish and spit 60 mL Tyton Abdallah B, NP   benzonatate (TESSALON) 100 MG capsule Take 1-2 capsules (100-200 mg total) by mouth 3 (three) times daily as needed for cough. 21 capsule Zigmund Gottron, NP     PDMP not reviewed this encounter.   Zigmund Gottron, NP 11/01/20 1744

## 2020-11-01 NOTE — ED Triage Notes (Signed)
Pt c/o possible thrush that seems to have developed since he has not been wearing his dentures, due to cough that has been increasing for several weeks. Pt reports white film over his tongue and inside his mouth. Pt has tried apple cider vinegar with mild improvement. Pt was just diagnosed with Flu A on Monday. Pt states cough is getting much worse.

## 2020-12-02 ENCOUNTER — Other Ambulatory Visit: Payer: Self-pay | Admitting: Family Medicine

## 2020-12-25 ENCOUNTER — Other Ambulatory Visit: Payer: Self-pay

## 2020-12-25 ENCOUNTER — Ambulatory Visit (INDEPENDENT_AMBULATORY_CARE_PROVIDER_SITE_OTHER): Payer: BLUE CROSS/BLUE SHIELD | Admitting: Urology

## 2020-12-25 ENCOUNTER — Encounter: Payer: Self-pay | Admitting: Urology

## 2020-12-25 VITALS — BP 119/82 | HR 85 | Ht 69.0 in | Wt 195.0 lb

## 2020-12-25 DIAGNOSIS — Z8551 Personal history of malignant neoplasm of bladder: Secondary | ICD-10-CM

## 2020-12-25 DIAGNOSIS — N138 Other obstructive and reflux uropathy: Secondary | ICD-10-CM

## 2020-12-25 DIAGNOSIS — N401 Enlarged prostate with lower urinary tract symptoms: Secondary | ICD-10-CM | POA: Diagnosis not present

## 2020-12-25 LAB — BLADDER SCAN AMB NON-IMAGING

## 2020-12-25 NOTE — Progress Notes (Signed)
   12/25/2020 2:14 PM   Jeffrey Fernandez [birth year] [id number]  Reason for visit: Follow up history of bladder cancer, BPH, gross hematuria  HPI: 65 year old male with distant history of bladder cancer 2013 who presented with recurrent gross hematuria.  He underwent work-up with a CT and cystoscopy in December 2021 that showed a 67 g prostate but no evidence of recurrence, and some friable vessels at the prostate.  He was started on Flomax for some BPH symptoms of weak stream at that time  He did not notice any significant improvement on the 90 days of Flomax, and did not continue this medication.  He denies any significant urinary symptoms at this time was he drinks a significant amount of beer, when he has weak stream and trouble emptying.  He has not had any further gross hematuria.  RTC 1 year IPSS, PVR   Billey Co, MD  [facility] [address] [place], [place] [address] [phone]

## 2020-12-25 NOTE — Patient Instructions (Signed)
Tadalafil Tablets (Erectile Dysfunction, BPH) What is this medication? TADALAFIL (tah DA la fil) treats erectile dysfunction (ED). It works by increasing blood flow to the penis, which helps to maintain an erection. It may also be used to treat symptoms of an enlarged prostate (benign prostatichyperplasia). This medicine may be used for other purposes; ask your health care provider orpharmacist if you have questions. COMMON BRAND NAME(S): Kathaleen Bury, Cialis What should I tell my care team before I take this medication? They need to know if you have any of these conditions: Anatomical deformation of the penis, Peyronie's disease, or history of priapism (painful and prolonged erection) Bleeding disorders Eye or vision problems, including a rare inherited eye disease called retinitis pigmentosa Heart disease, angina, a history of heart attack, irregular heart beats, or other heart problems High or low blood pressure History of blood diseases, like sickle cell anemia or leukemia History of stomach bleeding Kidney disease Liver disease Stroke An unusual or allergic reaction to tadalafil, other medications, foods, dyes, or preservatives Pregnant or trying to get pregnant Breast-feeding How should I use this medication? Take this medication by mouth with a glass of water. Follow the directions on the prescription label. You may take this medication with or without meals. When this medication is used for erection problems, your care team may prescribe it to be taken once daily or as needed. If you are taking the medication as needed, you may be able to have sexual activity 30 minutes after taking it and for up to 36 hours after taking it. Whether you are taking the medication as needed or once daily, you should not take more than one dose per day. If you are taking this medication for symptoms of benign prostatic hyperplasia (BPH) or to treat both BPH and an erection problem, take the dose once daily  at about the same time each day. Do not take your medication moreoften than directed. Talk to your care team about the use of this medication in children. Specialcare may be needed. Overdosage: If you think you have taken too much of this medicine contact apoison control center or emergency room at once. NOTE: This medicine is only for you. Do not share this medicine with others. What if I miss a dose? If you are taking this medication as needed for erection problems, this does not apply. If you miss a dose while taking this medication once daily for an erection problem, benign prostatic hyperplasia, or both, take it as soon as youremember, but do not take more than one dose per day. What may interact with this medication? Do not take this medication with any of the following: Nitrates like amyl nitrite, isosorbide dinitrate, isosorbide mononitrate, nitroglycerin Other medications for erectile dysfunction like avanafil, sildenafil, vardenafil Other tadalafil products (Adcirca) Riociguat This medication may also interact with the following: Certain medications for high blood pressure Certain medications for the treatment of HIV infection or AIDS Certain medications used for fungal or yeast infections, like fluconazole, itraconazole, ketoconazole, and voriconazole Certain medications used for seizures like carbamazepine, phenytoin, and phenobarbital Grapefruit juice Macrolide antibiotics like clarithromycin, erythromycin, troleandomycin Medications for prostate problems Rifabutin, rifampin or rifapentine This list may not describe all possible interactions. Give your health care provider a list of all the medicines, herbs, non-prescription drugs, or dietary supplements you use. Also tell them if you smoke, drink alcohol, or use illegaldrugs. Some items may interact with your medicine. What should I watch for while using this medication? If you notice  any changes in your vision while taking this  medication, call your care team as soon as possible. Stop using this medication and call yourcare team right away if you have a loss of sight in one or both eyes. Contact your care team right away if the erection lasts longer than 4 hours or if it becomes painful. This may be a sign of serious problem and must betreated right away to prevent permanent damage. If you experience symptoms of nausea, dizziness, chest pain or arm pain upon initiation of sexual activity after taking this medication, you should refrainfrom further activity and call your care team as soon as possible. Do not drink alcohol to excess (examples, 5 glasses of wine or 5 shots of whiskey) when taking this medication. When taken in excess, alcohol can increase your chances of getting a headache or getting dizzy, increasing yourheart rate or lowering your blood pressure. Using this medication does not protect you or your partner against HIVinfection (the virus that causes AIDS) or other sexually transmitted diseases. What side effects may I notice from receiving this medication? Side effects that you should report to your care team as soon as possible: Allergic reactions-skin rash, itching, hives, swelling of the face, lips, tongue, or throat Hearing loss or ringing in ears Heart attack-pain or tightness in the chest, shoulders, arms, or jaw, nausea, shortness of breath, cold or clammy skin, feeling faint or lightheaded Low blood pressure-dizziness, feeling faint or lightheaded, blurry vision Prolonged or painful erection Redness, blistering, peeling, or loosening of the skin, including inside the mouth Stroke-sudden numbness or weakness of the face, arm, or leg, trouble speaking, confusion, trouble walking, loss of balance or coordination, dizziness, severe headache, change in vision Sudden vision loss in one or both eyes Side effects that usually do not require medical attention (report to your careteam if they continue or are  bothersome): Back pain Facial flushing or redness Headache Muscle pain Runny or stuffy nose Upset stomach This list may not describe all possible side effects. Call your doctor for medical advice about side effects. You may report side effects to FDA at1-800-FDA-1088. Where should I keep my medication? Keep out of the reach of children. Store at room temperature between 15 and 30 degrees C (59 and 86 degrees F).Throw away any unused medication after the expiration date. NOTE: This sheet is a summary. It may not cover all possible information. If you have questions about this medicine, talk to your doctor, pharmacist, orhealth care provider.  2022 Elsevier/Gold Standard (2020-08-19 14:49:19)

## 2021-02-15 ENCOUNTER — Other Ambulatory Visit: Payer: Self-pay | Admitting: Family Medicine

## 2021-02-15 DIAGNOSIS — R002 Palpitations: Secondary | ICD-10-CM

## 2021-02-15 DIAGNOSIS — I10 Essential (primary) hypertension: Secondary | ICD-10-CM

## 2021-02-15 NOTE — Telephone Encounter (Signed)
Last RF:

## 2021-02-15 NOTE — Telephone Encounter (Signed)
last RF 01/22/20 #90 4 RF

## 2021-02-19 ENCOUNTER — Other Ambulatory Visit: Payer: Self-pay | Admitting: Family Medicine

## 2021-02-19 NOTE — Telephone Encounter (Signed)
Requested medications are due for refill today.  yes  Requested medications are on the active medications list.  yes  Last refill. 01/22/2020  Future visit scheduled.   no  Notes to clinic.  Prescription is expired. Pt needs appointment.

## 2021-02-28 ENCOUNTER — Other Ambulatory Visit: Payer: Self-pay | Admitting: Family Medicine

## 2021-02-28 DIAGNOSIS — K219 Gastro-esophageal reflux disease without esophagitis: Secondary | ICD-10-CM

## 2021-02-28 NOTE — Telephone Encounter (Signed)
Requested medications are due for refill today yes  Requested medications are on the active medication list yes  Last refill 12/02/20  Last visit 01/2020  Future visit scheduled no  Notes to clinic Failed protocol due to no valid visit within 12  months, no upcoming visit scheduled.

## 2021-03-24 ENCOUNTER — Other Ambulatory Visit: Payer: Self-pay | Admitting: Family Medicine

## 2021-03-24 DIAGNOSIS — K219 Gastro-esophageal reflux disease without esophagitis: Secondary | ICD-10-CM

## 2021-04-12 ENCOUNTER — Other Ambulatory Visit: Payer: Self-pay

## 2021-04-12 ENCOUNTER — Ambulatory Visit
Admission: EM | Admit: 2021-04-12 | Discharge: 2021-04-12 | Disposition: A | Discharge status: Home / Self Care | Payer: BLUE CROSS/BLUE SHIELD | Attending: Physician Assistant | Admitting: Physician Assistant

## 2021-04-12 DIAGNOSIS — K5732 Diverticulitis of large intestine without perforation or abscess without bleeding: Secondary | ICD-10-CM

## 2021-04-12 DIAGNOSIS — R1030 Lower abdominal pain, unspecified: Secondary | ICD-10-CM

## 2021-04-12 MED ORDER — CIPROFLOXACIN HCL 750 MG PO TABS: 750.0000 mg | ORAL_TABLET | Freq: Two times a day (BID) | ORAL | 0 refills | Status: AC

## 2021-04-12 MED ORDER — METRONIDAZOLE 500 MG PO TABS: 500.0000 mg | ORAL_TABLET | Freq: Four times a day (QID) | ORAL | 0 refills | Status: AC

## 2021-04-12 NOTE — ED Provider Notes (Signed)
[facility]    CSN: [id number] Arrival date & time: 04/12/21  1057      History   Chief Complaint Chief Complaint  Patient presents with   Abdominal Pain    HPI Jeffrey Fernandez is a 65 y.o. male presenting for 1 week history of lower abdominal pain and bloating.  Patient also admits to nausea without vomiting.  Additionally he reports multiple "flat" and sometimes loose stools a day for the past week.  He admits to a small amount of bright red blood in the toilet bowl several days ago.  Patient does have history of getting fissures when he has constipation and bears down too much.  Additionally patient reports history of diverticulitis and has had a couple of flareups.  Last colonoscopy was in 2015.  Patient has history of associated abscess which was treated with antibiotics.  Patient says he is afraid that the infection may get just as bad so he would like to start antibiotics soon as possible.  He says he has started a bland diet and has been drinking plenty of fluids and eating soft foods which has not helped.  Patient unable to see his primary care provider until next Thursday.  He denies any associated fevers, fatigue, severe pain, dark stools or weakness.  Patient says symptoms are consistent with flareups of diverticulitis he has had in the past.  He continues to pass gas and have belching.  No other complaints.  HPI  Past Medical History:  Diagnosis Date   Cancer ([place]) 2013   bladder   Diverticulitis    History of chickenpox    History of measles    History of mumps    Hyperlipidemia    Hypertension     Patient Active Problem List   Diagnosis Date Noted   Enlarged prostate 06/26/2020   Hematuria 06/10/2020   Aortic atherosclerosis ([place]) 12/13/2017   Emphysema of lung ([place]) 12/13/2017   Gastroesophageal reflux disease 11/17/2017   Stopped smoking with greater than 30 pack year history 03/20/2016   Personal history of bladder cancer 03/19/2016    Hyperlipidemia 03/19/2016   Diverticulitis of sigmoid colon 03/19/2016   Synovial cyst of popliteal space 03/19/2016   Benign localized hyperplasia of prostate with urinary obstruction and other lower urinary tract symptoms (LUTS)(600.21) 03/08/2013   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 Sherral Hammers, [facility]   VASECTOMY  2002   Bilateral inguinal herniorrhaphies       Home Medications    Prior to Admission medications   Medication Sig Start Date End Date Taking? Authorizing Provider  ciprofloxacin (CIPRO) 750 MG tablet Take 1 tablet (750 mg total) by mouth 2 (two) times daily for 7 days. 04/12/21 04/19/21 Yes Danton Clap, PA-C  metroNIDAZOLE (FLAGYL) 500 MG tablet Take 1 tablet (500 mg total) by mouth 4 (four) times daily for 7 days. 04/12/21 04/19/21 Yes Danton Clap, PA-C  aspirin 81 MG tablet Take 81 mg by mouth daily.    [provider]  losartan-hydrochlorothiazide (HYZAAR) 50-12.5 MG tablet TAKE 1 TABLET BY MOUTH EVERY DAY 02/19/21   Birdie Sons, MD  metoprolol succinate (TOPROL-XL) 25 MG 24 hr tablet Take 1 tablet (25 mg total) by mouth daily. 01/22/20  Birdie Sons, MD  Multiple Vitamins-Minerals (OCUVITE ADULT FORMULA PO) Take 1 capsule by mouth.    [provider]  pantoprazole (PROTONIX) 40 MG tablet TAKE 1 TABLET (40 MG TOTAL) BY MOUTH DAILY. PLEASE SCHEDULE OFFICE VISIT BEFORE ANY FUTURE REFILLS. 03/24/21   Birdie Sons, MD  sildenafil (REVATIO) 20 MG tablet Take 1-5 tablets (20-100 mg total) by mouth daily as needed. 06/18/20   Billey Co, MD    Family History Family History  Problem Relation Age of Onset   COPD Mother    Diverticulosis Mother    Diverticulosis Father    Diabetes  Father    Hypertension Father    Diverticulosis Sister    Stroke Paternal Grandmother     Social History Social History   Tobacco Use   Smoking status: Former    Packs/day: 2.00    Years: 39.00    Pack years: 78.00    Types: Cigarettes    Quit date: 07/14/2011    Years since quitting: 9.7   Smokeless tobacco: Never   Tobacco comments:    smoked 1 to 1 1/2  ppd for 35 years  Vaping Use   Vaping Use: Never used  Substance Use Topics   Alcohol use: Yes    Alcohol/week: 0.0 standard drinks    Comment: drinks 2 beers daily   Drug use: No     Allergies   Amoxicillin, Atenolol, Other, Oxycodone, and Shellfish allergy   Review of Systems Review of Systems  Constitutional:  Positive for appetite change. Negative for fatigue and fever.  Gastrointestinal:  Positive for abdominal distention, abdominal pain and nausea. Negative for blood in stool, constipation, diarrhea, rectal pain and vomiting.       Abnormal stools. Flat and loose  Genitourinary:  Negative for decreased urine volume.  Musculoskeletal:  Negative for back pain.  Neurological:  Negative for weakness.    Physical Exam Triage Vital Signs ED Triage Vitals  Enc Vitals Group     BP 04/12/21 1107 (!) 136/95     Pulse Rate 04/12/21 1107 90     Resp 04/12/21 1107 18     Temp 04/12/21 1107 98.1 F (36.7 C)     Temp Source 04/12/21 1107 Oral     SpO2 04/12/21 1107 99 %     Weight 04/12/21 1106 198 lb (89.8 kg)     Height 04/12/21 1106 5\' 9"  (1.753 m)     Head Circumference --      Peak Flow --      Pain Score 04/12/21 1106 4     Pain Loc --      Pain Edu? --      Excl. in [place]? --    No data found.  Updated Vital Signs BP (!) 136/95 (BP Location: Left Arm)   Pulse 90   Temp 98.1 F (36.7 C) (Oral)   Resp 18   Ht 5\' 9"  (1.753 m)   Wt 198 lb (89.8 kg)   SpO2 99%   BMI 29.24 kg/m       Physical Exam Vitals and nursing note reviewed.  Constitutional:      General: He is not in acute distress.     Appearance: Normal appearance. He is well-developed. He is not ill-appearing.  HENT:     Head: Normocephalic and atraumatic.  Eyes:     General: No scleral icterus.    Conjunctiva/sclera: Conjunctivae normal.  Cardiovascular:     Rate and Rhythm: Normal rate and  regular rhythm.     Heart sounds: Normal heart sounds.  Pulmonary:     Effort: Pulmonary effort is normal. No respiratory distress.     Breath sounds: Normal breath sounds.  Abdominal:     General: Abdomen is protuberant. Bowel sounds are normal. There is distension (mild/moderate).     Palpations: Abdomen is soft.     Tenderness: There is abdominal tenderness in the suprapubic area and left lower quadrant. There is no guarding or rebound.  Musculoskeletal:     Cervical back: Neck supple.  Skin:    General: Skin is warm and dry.  Neurological:     General: No focal deficit present.     Mental Status: He is alert. Mental status is at baseline.     Motor: No weakness.     Coordination: Coordination normal.  Psychiatric:        Mood and Affect: Mood normal.        Behavior: Behavior normal.        Thought Content: Thought content normal.     UC Treatments / Results  Labs (all labs ordered are listed, but only abnormal results are displayed) Labs Reviewed - No data to display  EKG   Radiology No results found.  Procedures Procedures (including critical care time)  Medications Ordered in UC Medications - No data to display  Initial Impression / Assessment and Plan / UC Course  I have reviewed the triage vital signs and the nursing notes.  Pertinent labs & imaging results that were available during my care of the patient were reviewed by me and considered in my medical decision making (see chart for details).  65 year old male presenting for abdominal pain and distention as well as abnormal (flat loose) stools for the past week.  Patient has history of diverticulitis.  Has been treated for the exact same  symptoms in the past related to diverticulitis flareup.  Patient is certain of this.  No red flag signs or symptoms today.  He is denying any dark stools, fever or severe pain.  Additionally he continues to have flatulence and have bowel movements.  I did discuss the importance of close monitoring.  I have advised patient that I can treat him with metronidazole and Cipro given his history.  He is allergic to amoxicillin so not treating with Augmentin.  Advised that he do make an appoint with his primary care provider for this coming week for recheck.  Thoroughly reviewed ED precautions with patient and advised him to go to the emergency department for any increased pain, fever or if he is not passing stools or having flatulence.  Advised patient he may need a CT of his abdomen to assess for other potential causes of the abdominal pain versus development of abscess if symptoms are worsening or not getting better.  Stable and overall well-appearing at this point.  Patient agrees to plan.   Final Clinical Impressions(s) / UC Diagnoses   Final diagnoses:  Diverticulitis of colon  Lower abdominal pain     Discharge Instructions      -Since your current symptoms are consistent with flareups of diverticulitis you have had in the past, we will treat you with antibiotics.  I have sent Cipro and Flagyl since you are allergic to amoxicillin.  Take full course.  Sometimes antibiotics may need to be extended a couple more days.  That is why, I would like you to make an appoint with your primary care provider for next week for  recheck to see how you are doing.  Be sure to increase rest and fluids and continue with bland diet. -If you develop fever, large amounts of blood in the stool, increased abdominal pain, increased fatigue/lethargy, are not passing stools and are not having any belching or flatulence, you need to go to the emergency department for evaluation which would include labs and CT.     ED  Prescriptions     Medication Sig Dispense Auth. Provider   metroNIDAZOLE (FLAGYL) 500 MG tablet Take 1 tablet (500 mg total) by mouth 4 (four) times daily for 7 days. 28 tablet Laurene Footman B, PA-C   ciprofloxacin (CIPRO) 750 MG tablet Take 1 tablet (750 mg total) by mouth 2 (two) times daily for 7 days. 14 tablet Gretta Cool      PDMP not reviewed this encounter.   Danton Clap, PA-C 04/12/21 1222

## 2021-04-12 NOTE — ED Triage Notes (Signed)
One week of abdominal pain under belly button, mild nausea, and bloating. Typical of his diverticulitis. States his surgeon that always prescribed his ABX retired.

## 2021-04-12 NOTE — Discharge Instructions (Addendum)
-  Since your current symptoms are consistent with flareups of diverticulitis you have had in the past, we will treat you with antibiotics.  I have sent Cipro and Flagyl since you are allergic to amoxicillin.  Take full course.  Sometimes antibiotics may need to be extended a couple more days.  That is why, I would like you to make an appoint with your primary care provider for next week for recheck to see how you are doing.  Be sure to increase rest and fluids and continue with bland diet. -If you develop fever, large amounts of blood in the stool, increased abdominal pain, increased fatigue/lethargy, are not passing stools and are not having any belching or flatulence, you need to go to the emergency department for evaluation which would include labs and CT.

## 2021-04-15 ENCOUNTER — Ambulatory Visit: Payer: Self-pay | Admitting: *Deleted

## 2021-04-15 DIAGNOSIS — U071 COVID-19: Secondary | ICD-10-CM

## 2021-04-15 MED ORDER — NIRMATRELVIR/RITONAVIR (PAXLOVID)TABLET: 3.0000 | ORAL_TABLET | Freq: Two times a day (BID) | ORAL | 0 refills | Status: AC

## 2021-04-15 NOTE — Telephone Encounter (Signed)
Have sent prescription for paxlovid to [facility].

## 2021-04-15 NOTE — Telephone Encounter (Signed)
Patient is calling to report he has + COVID home test. Patient states his wife is + COVID and he started having symptoms last night- sore throat,headache,slight cough, diarrhea(patient is on antibiotic treatment for diverticulitis now). Patient lives in the home where they care for elderly family member. Patient is moderate risk patient and is requesting antiviral treatment. COVID protocols reviewed for isolation and treatment.

## 2021-04-15 NOTE — Telephone Encounter (Signed)
Pt advised.   Thanks,   -Stanislaus Kaltenbach  

## 2021-04-15 NOTE — Telephone Encounter (Signed)
Pt stated he tested positive for Covid and he has questions and concerns. Pt also would like to discuss getting the anti-viral medication. Pt requests call back. Cb# [phone]  Reason for Disposition  [1] HIGH RISK for severe COVID complications (e.g., weak immune system, age > 46 years, obesity with BMI > 25, pregnant, chronic lung disease or other chronic medical condition) AND [2] COVID symptoms (e.g., cough, fever)  (Exceptions: Already seen by PCP and no new or worsening symptoms.)  Answer Assessment - Initial Assessment Questions 1. COVID-19 DIAGNOSIS: "Who made your COVID-19 diagnosis?" "Was it confirmed by a positive lab test or self-test?" If not diagnosed by a doctor (or NP/PA), ask "Are there lots of cases (community spread) where you live?" Note: See public health department website, if unsure.     + COVID home test 2. COVID-19 EXPOSURE: "Was there any known exposure to COVID before the symptoms began?" CDC Definition of close contact: within 6 feet (2 meters) for a total of 15 minutes or more over a 24-hour period.      Wife  3. ONSET: "When did the COVID-19 symptoms start?"      Sore throat,headache 4. WORST SYMPTOM: "What is your worst symptom?" (e.g., cough, fever, shortness of breath, muscle aches)     headache 5. COUGH: "Do you have a cough?" If Yes, ask: "How bad is the cough?"       Yes- slight-due to sinus drainage 6. FEVER: "Do you have a fever?" If Yes, ask: "What is your temperature, how was it measured, and when did it start?"     no 7. RESPIRATORY STATUS: "Describe your breathing?" (e.g., shortness of breath, wheezing, unable to speak)      Slight SOB- exertion/congestion 8. BETTER-SAME-WORSE: "Are you getting better, staying the same or getting worse compared to yesterday?"  If getting worse, ask, "In what way?"     Worse- new symptoms 9. HIGH RISK DISEASE: "Do you have any chronic medical problems?" (e.g., asthma, heart or lung disease, weak immune system, obesity,  etc.)     Recovering from diverticulitis - on antibiotic treatment now 10. VACCINE: "Have you had the COVID-19 vaccine?" If Yes, ask: "Which one, how many shots, when did you get it?"       Yes- pfizer  11. BOOSTER: "Have you received your COVID-19 booster?" If Yes, ask: "Which one and when did you get it?"       Yes- 03/22/21- pfizer  12. PREGNANCY: "Is there any chance you are pregnant?" "When was your last menstrual period?"       N/a 13. OTHER SYMPTOMS: "Do you have any other symptoms?"  (e.g., chills, fatigue, headache, loss of smell or taste, muscle pain, sore throat)       Fatigue,headache,sore throat, congestion, diarrhea  14. O2 SATURATION MONITOR:  "Do you use an oxygen saturation monitor (pulse oximeter) at home?" If Yes, ask "What is your reading (oxygen level) today?" "What is your usual oxygen saturation reading?" (e.g., 95%)       Not checked  Protocols used: Coronavirus (COVID-19) Diagnosed or Suspected-A-AH

## 2021-04-20 ENCOUNTER — Other Ambulatory Visit: Payer: Self-pay | Admitting: Family Medicine

## 2021-04-20 DIAGNOSIS — K219 Gastro-esophageal reflux disease without esophagitis: Secondary | ICD-10-CM

## 2021-04-20 NOTE — Telephone Encounter (Signed)
Requested Prescriptions  Pending Prescriptions Disp Refills  . pantoprazole (PROTONIX) 40 MG tablet [Pharmacy Med Name: PANTOPRAZOLE SOD DR 40 MG TAB] 9 tablet 0    Sig: TAKE 1 TABLET (40 MG TOTAL) BY MOUTH DAILY. PLEASE SCHEDULE OFFICE VISIT BEFORE ANY FUTURE REFILLS.     Gastroenterology: Proton Pump Inhibitors Passed - 04/20/2021 12:30 PM      Passed - Valid encounter within last 12 months    Recent Outpatient Visits          10 months ago Hematuria, unspecified type   [facility] Flinchum, Kelby Aline, FNP   1 year ago Essential (primary) hypertension   [facility] Birdie Sons, MD   1 year ago Sinus congestion   [place], [place], [place]   2 years ago Chest pain, unspecified type   [facility] Birdie Sons, MD   2 years ago Cough   [facility] Birdie Sons, MD      Future Appointments            In 1 week Fisher, Kirstie Peri, MD [facility] [place]

## 2021-04-29 NOTE — Progress Notes (Signed)
Established patient visit   Patient: Jeffrey Fernandez   DOB: [birth year]   65 y.o. Male  MRN: [id number] Visit Date: 04/30/2021  Today's healthcare provider: Lelon Huh, MD   Chief Complaint  Patient presents with   Diverticulitis   Subjective    HPI  Follow up Urgent Care visit  Patient was seen in Urgent Care for lower abdominal pain on 04/12/2021. He was treated for diverticulitis. Treatment for this included metronidazole and Cipro . He reports excellent compliance with treatment. He reports this condition is resolved.  ----------------------------------------------------------------------------------------- He is also due for follow up GERD and hypertension. States he has no heartburn or other reflux symptoms so long as he take pantoprazole every day. Is doing well     Medications: Outpatient Medications Prior to Visit  Medication Sig   aspirin 81 MG tablet Take 81 mg by mouth daily.   losartan-hydrochlorothiazide (HYZAAR) 50-12.5 MG tablet TAKE 1 TABLET BY MOUTH EVERY DAY   metoprolol succinate (TOPROL-XL) 25 MG 24 hr tablet Take 1 tablet (25 mg total) by mouth daily.   Multiple Vitamins-Minerals (OCUVITE ADULT FORMULA PO) Take 1 capsule by mouth.   pantoprazole (PROTONIX) 40 MG tablet TAKE 1 TABLET (40 MG TOTAL) BY MOUTH DAILY. PLEASE SCHEDULE OFFICE VISIT BEFORE ANY FUTURE REFILLS.   sildenafil (REVATIO) 20 MG tablet Take 1-5 tablets (20-100 mg total) by mouth daily as needed.   No facility-administered medications prior to visit.    Review of Systems  Constitutional: Negative.   Respiratory: Negative.    Gastrointestinal: Negative.   Neurological:  Negative for dizziness, light-headedness and headaches.      Objective    BP 123/78 (BP Location: Right Arm, Patient Position: Sitting, Cuff Size: Large)   Pulse 70   Temp 97.9 F (36.6 C) (Oral)   Wt 199 lb (90.3 kg)   SpO2 99%   BMI 29.39 kg/m    Physical Exam   General: Appearance:      Overweight male in no acute distress  Eyes:    PERRL, conjunctiva/corneas clear, EOM's intact       Lungs:     Clear to auscultation bilaterally, respirations unlabored  Heart:    Normal heart rate. Normal rhythm. No murmurs, rubs, or gallops.    MS:   All extremities are intact.    Neurologic:   Awake, alert, oriented x 3. No apparent focal neurological defect.          Assessment & Plan     1. Diverticulitis Resolved with ciprofloxacin and metronidazole. Reviewed dietary precautions.   2. Essential (primary) hypertension Well controlled.  Continue current medications.   Refill metoprolol succinate (TOPROL-XL) 25 MG 24 hr tablet; Take 1 tablet (25 mg total) by mouth daily.  Dispense: 90 tablet; Refill: 2 Refill losartan-hydrochlorothiazide (HYZAAR) 50-12.5 MG tablet; Take 1 tablet by mouth daily.  Dispense: 90 tablet; Refill: 2 - Comprehensive metabolic panel - Lipid panel  3. Palpitations Well controlled on betablocker.  Refill metoprolol succinate (TOPROL-XL) 25 MG 24 hr tablet; Take 1 tablet (25 mg total) by mouth daily.  Dispense: 90 tablet; Refill: 2  4. Gastroesophageal reflux disease Well controlled.  Continue current medications.  refill pantoprazole (PROTONIX) 40 MG tablet; Take 1 tablet (40 mg total) by mouth daily.  Dispense: 90 tablet; Refill: 2      The entirety of the information documented in the History of Present Illness, Review of Systems and Physical Exam were personally obtained by me. Portions of this  information were initially documented by the CMA and reviewed by me for thoroughness and accuracy.    Lelon Huh, MD  [facility] [phone] (phone) [phone] (fax)  [place]

## 2021-04-30 ENCOUNTER — Ambulatory Visit: Payer: BLUE CROSS/BLUE SHIELD | Admitting: Family Medicine

## 2021-04-30 ENCOUNTER — Other Ambulatory Visit: Payer: Self-pay

## 2021-04-30 ENCOUNTER — Encounter: Payer: Self-pay | Admitting: Family Medicine

## 2021-04-30 VITALS — BP 123/78 | HR 70 | Temp 97.9°F | Wt 199.0 lb

## 2021-04-30 DIAGNOSIS — I10 Essential (primary) hypertension: Secondary | ICD-10-CM | POA: Diagnosis not present

## 2021-04-30 DIAGNOSIS — R002 Palpitations: Secondary | ICD-10-CM | POA: Diagnosis not present

## 2021-04-30 DIAGNOSIS — K5792 Diverticulitis of intestine, part unspecified, without perforation or abscess without bleeding: Secondary | ICD-10-CM | POA: Diagnosis not present

## 2021-04-30 DIAGNOSIS — K219 Gastro-esophageal reflux disease without esophagitis: Secondary | ICD-10-CM

## 2021-04-30 MED ORDER — LOSARTAN POTASSIUM-HCTZ 50-12.5 MG PO TABS: 1.0000 | ORAL_TABLET | Freq: Every day | ORAL | 2 refills | Status: DC

## 2021-04-30 MED ORDER — METOPROLOL SUCCINATE ER 25 MG PO TB24: 25.0000 mg | ORAL_TABLET | Freq: Every day | ORAL | 2 refills | Status: DC

## 2021-04-30 MED ORDER — PANTOPRAZOLE SODIUM 40 MG PO TBEC: 40.0000 mg | DELAYED_RELEASE_TABLET | Freq: Every day | ORAL | 2 refills | Status: DC

## 2021-04-30 NOTE — Patient Instructions (Signed)
Please review the attached list of medications and notify my office if there are any errors.   Please go to the lab draw station in [address] on the second floor of [facility]  when you are fasting for 12 hours. Normal hours are 8:00am to 11:30am and 1:00pm to 4:00pm Monday through Friday

## 2021-05-06 LAB — COMPREHENSIVE METABOLIC PANEL
ALT: 26 IU/L (ref 0–44)
AST: 24 IU/L (ref 0–40)
Albumin/Globulin Ratio: 1.7 (ref 1.2–2.2)
Albumin: 4.3 g/dL (ref 3.8–4.8)
Alkaline Phosphatase: 107 IU/L (ref 44–121)
BUN/Creatinine Ratio: 8 — ABNORMAL LOW (ref 10–24)
BUN: 8 mg/dL (ref 8–27)
Bilirubin Total: 0.9 mg/dL (ref 0.0–1.2)
CO2: 25 mmol/L (ref 20–29)
Calcium: 9.6 mg/dL (ref 8.6–10.2)
Chloride: 101 mmol/L (ref 96–106)
Creatinine, Ser: 1.05 mg/dL (ref 0.76–1.27)
Globulin, Total: 2.6 g/dL (ref 1.5–4.5)
Glucose: 98 mg/dL (ref 70–99)
Potassium: 4.1 mmol/L (ref 3.5–5.2)
Sodium: 140 mmol/L (ref 134–144)
Total Protein: 6.9 g/dL (ref 6.0–8.5)
eGFR: 80 mL/min/{1.73_m2} (ref >59)

## 2021-05-06 LAB — LIPID PANEL
Chol/HDL Ratio: 5 ratio (ref 0.0–5.0)
Cholesterol, Total: 184 mg/dL (ref 100–199)
HDL: 37 mg/dL — ABNORMAL LOW (ref >39)
LDL Chol Calc (NIH): 106 mg/dL — ABNORMAL HIGH (ref 0–99)
Triglycerides: 236 mg/dL — ABNORMAL HIGH (ref 0–149)
VLDL Cholesterol Cal: 41 mg/dL — ABNORMAL HIGH (ref 5–40)

## 2021-07-24 ENCOUNTER — Other Ambulatory Visit: Payer: Self-pay | Admitting: *Deleted

## 2021-07-24 DIAGNOSIS — Z87891 Personal history of nicotine dependence: Secondary | ICD-10-CM

## 2021-08-05 ENCOUNTER — Ambulatory Visit
Admission: RE | Admit: 2021-08-05 | Discharge: 2021-08-05 | Disposition: A | Discharge status: Home / Self Care | Payer: BLUE CROSS/BLUE SHIELD | Source: Ambulatory Visit | Attending: Acute Care | Admitting: Acute Care

## 2021-08-05 ENCOUNTER — Other Ambulatory Visit: Payer: Self-pay

## 2021-08-05 DIAGNOSIS — Z87891 Personal history of nicotine dependence: Secondary | ICD-10-CM | POA: Insufficient documentation

## 2021-08-17 ENCOUNTER — Ambulatory Visit
Admission: EM | Admit: 2021-08-17 | Discharge: 2021-08-17 | Disposition: A | Discharge status: Home / Self Care | Payer: BLUE CROSS/BLUE SHIELD | Attending: Emergency Medicine | Admitting: Emergency Medicine

## 2021-08-17 ENCOUNTER — Other Ambulatory Visit: Payer: Self-pay

## 2021-08-17 DIAGNOSIS — J069 Acute upper respiratory infection, unspecified: Secondary | ICD-10-CM

## 2021-08-17 DIAGNOSIS — H109 Unspecified conjunctivitis: Secondary | ICD-10-CM

## 2021-08-17 DIAGNOSIS — B9689 Other specified bacterial agents as the cause of diseases classified elsewhere: Secondary | ICD-10-CM

## 2021-08-17 MED ORDER — POLYMYXIN B-TRIMETHOPRIM 10000-0.1 UNIT/ML-% OP SOLN: 1.0000 [drp] | OPHTHALMIC | 0 refills | Status: DC

## 2021-08-17 MED ORDER — PREDNISONE 20 MG PO TABS: 40.0000 mg | ORAL_TABLET | Freq: Every day | ORAL | 0 refills | Status: DC

## 2021-08-17 MED ORDER — AZITHROMYCIN 250 MG PO TABS: 250.0000 mg | ORAL_TABLET | Freq: Every day | ORAL | 0 refills | Status: DC

## 2021-08-17 NOTE — Discharge Instructions (Signed)
Your symptoms today are most likely being caused by a virus and should steadily improve in time it can take up to 7 to 10 days before you truly start to see a turnaround however things will get better  However due to your history of emphysema we will cover for bronchitis today  Take azithromycin as directed, this medicine is to read any possible bacteria  Begin use of prednisone every morning with food for the next 5 days, this medication will due to irritation and inflammation to the upper airways  For your eyes take Polytrim, Place 1 drop in each eye every 4 hours for the next 7 days to clear infection, you may use Naphcon or cool compress to remove drainage, avoid rubbing or touching eyes as this will cause irritation and increase chances of spread, you may take Claritin or Zyrtec daily to help with itching, if symptoms continue to persist please follow-up with your ophthalmologist    You can take Tylenol and/or Ibuprofen as needed for fever reduction and pain relief.   For cough: honey 1/2 to 1 teaspoon (you can dilute the honey in water or another fluid).  You can also use guaifenesin and dextromethorphan for cough. You can use a humidifier for chest congestion and cough.  If you don't have a humidifier, you can sit in the bathroom with the hot shower running.      For sore throat: try warm salt water gargles, cepacol lozenges, throat spray, warm tea or water with lemon/honey, popsicles or ice, or OTC cold relief medicine for throat discomfort.   For congestion: take a daily anti-histamine like Zyrtec, Claritin, and a oral decongestant, such as pseudoephedrine.  You can also use Flonase 1-2 sprays in each nostril daily.   It is important to stay hydrated: drink plenty of fluids (water, gatorade/powerade/pedialyte, juices, or teas) to keep your throat moisturized and help further relieve irritation/discomfort.

## 2021-08-17 NOTE — ED Provider Notes (Signed)
[facility]    CSN: [id number] Arrival date & time: 08/17/21  0845      History   Chief Complaint Chief Complaint  Patient presents with   Nasal Congestion   Cough   Eye Drainage    HPI CHINONSO LINKER is a 66 y.o. male.   Patient presents with chills, nasal congestion, rhinorrhea, sinus pressure, sore throat, nonproductive cough and generalized headaches for 7 days.  Symptoms are worsened at nighttime with associated wheezing interfering with sleep.  Endorses that 3 days ago bilateral eyes began itching and became irritated, woke up this morning with drainage and crusting.  Has attempted use of Tylenol and antihistamine which have not been effective.  Denies fevers, ear pain, abdominal pain, nausea, vomiting, diarrhea, shortness of breath, blurred vision, eye redness and light sensitivity.  History of bladder cancer, emphysema, diverticulitis, GERD, hyperlipidemia, former smoker.   Past Medical History:  Diagnosis Date   Cancer ([place]) 2013   bladder   Diverticulitis    History of chickenpox    History of measles    History of mumps    Hyperlipidemia    Hypertension     Patient Active Problem List   Diagnosis Date Noted   Enlarged prostate 06/26/2020   Hematuria 06/10/2020   Aortic atherosclerosis ([place]) 12/13/2017   Emphysema of lung ([place]) 12/13/2017   Gastroesophageal reflux disease 11/17/2017   Stopped smoking with greater than 30 pack year history 03/20/2016   Personal history of bladder cancer 03/19/2016   Hyperlipidemia 03/19/2016   Diverticulitis of sigmoid colon 03/19/2016   Synovial cyst of popliteal space 03/19/2016   Benign localized hyperplasia of prostate with urinary obstruction and other lower urinary tract symptoms (LUTS)(600.21) 03/08/2013   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 Sherral Hammers, [facility]   VASECTOMY  2002   Bilateral inguinal herniorrhaphies       Home Medications    Prior to Admission medications   Medication Sig Start Date End Date Taking? Authorizing Provider  aspirin 81 MG tablet Take 81 mg by mouth daily.   Yes [provider]  losartan-hydrochlorothiazide (HYZAAR) 50-12.5 MG tablet Take 1 tablet by mouth daily. 04/30/21  Yes Birdie Sons, MD  metoprolol succinate (TOPROL-XL) 25 MG 24 hr tablet Take 1 tablet (25 mg total) by mouth daily. 04/30/21  Yes Birdie Sons, MD  Multiple Vitamins-Minerals (OCUVITE ADULT FORMULA PO) Take 1 capsule by mouth.   Yes [provider]  pantoprazole (PROTONIX) 40 MG tablet Take 1 tablet (40 mg total) by mouth daily. 04/30/21  Yes Birdie Sons, MD  sildenafil (REVATIO) 20 MG tablet Take 1-5 tablets (20-100 mg total) by mouth daily as needed. 06/18/20  Yes Billey Co, MD    Family History Family History  Problem Relation Age of Onset   COPD Mother    Diverticulosis Mother    Diverticulosis Father    Diabetes Father    Hypertension Father    Diverticulosis Sister    Stroke Paternal Grandmother     Social History Social History   Tobacco Use   Smoking status: Former    Packs/day: 2.00    Years: 39.00    Pack years: 78.00    Types: Cigarettes  Quit date: 07/14/2011    Years since quitting: 10.1   Smokeless tobacco: Never   Tobacco comments:    smoked 1 to 1 1/2  ppd for 35 years  Vaping Use   Vaping Use: Never used  Substance Use Topics   Alcohol use: Yes    Alcohol/week: 0.0 standard drinks    Comment: drinks 2 beers daily   Drug use: No     Allergies   Amoxicillin, Atenolol, Other, Oxycodone, and Shellfish allergy   Review of Systems Review of Systems  Constitutional:  Positive for chills. Negative for activity change, appetite change, diaphoresis, fatigue,  fever and unexpected weight change.  HENT:  Positive for congestion, rhinorrhea, sinus pressure and sore throat. Negative for dental problem, drooling, ear discharge, ear pain, facial swelling, hearing loss, mouth sores, nosebleeds, postnasal drip, sinus pain, sneezing, tinnitus, trouble swallowing and voice change.   Eyes:  Positive for pain, discharge and itching. Negative for photophobia, redness and visual disturbance.  Respiratory:  Positive for cough and wheezing. Negative for apnea, choking, chest tightness, shortness of breath and stridor.   Cardiovascular: Negative.   Gastrointestinal: Negative.   Skin: Negative.   Neurological:  Positive for headaches. Negative for dizziness, tremors, seizures, syncope, facial asymmetry, speech difficulty, weakness, light-headedness and numbness.    Physical Exam Triage Vital Signs ED Triage Vitals  Enc Vitals Group     BP 08/17/21 0857 138/82     Pulse Rate 08/17/21 0857 (!) 105     Resp 08/17/21 0857 18     Temp 08/17/21 0857 99.5 F (37.5 C)     Temp Source 08/17/21 0857 Oral     SpO2 08/17/21 0857 94 %     Weight 08/17/21 0856 199 lb (90.3 kg)     Height 08/17/21 0856 5\' 9"  (1.753 m)     Head Circumference --      Peak Flow --      Pain Score 08/17/21 0856 4     Pain Loc --      Pain Edu? --      Excl. in [place]? --    No data found.  Updated Vital Signs BP 138/82 (BP Location: Left Arm)    Pulse (!) 105    Temp 99.5 F (37.5 C) (Oral)    Resp 18    Ht 5\' 9"  (1.753 m)    Wt 199 lb (90.3 kg)    SpO2 94%    BMI 29.39 kg/m   Visual Acuity Right Eye Distance:   Left Eye Distance:   Bilateral Distance:    Right Eye Near:   Left Eye Near:    Bilateral Near:     Physical Exam Constitutional:      Appearance: Normal appearance.  HENT:     Head: Normocephalic.     Right Ear: Tympanic membrane, ear canal and external ear normal.     Left Ear: Tympanic membrane, ear canal and external ear normal.     Nose: Congestion present.      Mouth/Throat:     Mouth: Mucous membranes are moist.     Pharynx: Posterior oropharyngeal erythema present.  Eyes:     General:        Right eye: No discharge.        Left eye: No discharge.     Extraocular Movements: Extraocular movements intact.     Conjunctiva/sclera:     Right eye: No exudate.    Left eye: No exudate. Cardiovascular:  Rate and Rhythm: Normal rate and regular rhythm.     Pulses: Normal pulses.     Heart sounds: Normal heart sounds.  Pulmonary:     Effort: Pulmonary effort is normal.     Breath sounds: Normal breath sounds.  Musculoskeletal:     Cervical back: Normal range of motion and neck supple.  Skin:    General: Skin is warm and dry.  Neurological:     Mental Status: He is alert and oriented to person, place, and time. Mental status is at baseline.  Psychiatric:        Mood and Affect: Mood normal.        Behavior: Behavior normal.     UC Treatments / Results  Labs (all labs ordered are listed, but only abnormal results are displayed) Labs Reviewed - No data to display  EKG   Radiology No results found.  Procedures Procedures (including critical care time)  Medications Ordered in UC Medications - No data to display  Initial Impression / Assessment and Plan / UC Course  I have reviewed the triage vital signs and the nursing notes.  Pertinent labs & imaging results that were available during my care of the patient were reviewed by me and considered in my medical decision making (see chart for details).  URI with cough Bacterial conjunctivitis of both eyes  Vital signs are stable, low-grade fever of 99.5 with associated tachycardia noted, O2 saturation 94% on room air and lungs are clear with auscultation, discussed findings with patient, etiology of symptoms are most likely viral however due to history of emphysema, will cover for bronchitis today as patient endorses symptoms are worsening, prescribed Z-Pak and prednisone 40 mg burst  for 5 days, recommend to continue use of over-the-counter medications for further management, will treat for conjunctivitis based on symptomology, Polytrim 7-day course prescribed, recommended cool compresses and napkins to remove drainage, oral antihistamines for itching, for persistent or worsening symptoms of the eye patient to follow-up with ophthalmologist Final Clinical Impressions(s) / UC Diagnoses   Final diagnoses:  None   Discharge Instructions   None    ED Prescriptions   None    PDMP not reviewed this encounter.   Hans Eden, [place] 08/17/21 [phone]

## 2021-08-17 NOTE — ED Triage Notes (Signed)
Pt believes he has bronchitis. Pt c/o cough, headache, congestion, fatigue, sinus drainage x1week.  Pt states that his symptoms are getting worse.   Pt believes he has pink eye as he has had eye drainage and woke up with both eyes "crusted over". Pt states they have been itching for 3 days.

## 2021-09-01 ENCOUNTER — Other Ambulatory Visit: Payer: Self-pay

## 2021-09-01 ENCOUNTER — Telehealth: Payer: Self-pay | Admitting: Acute Care

## 2021-09-01 DIAGNOSIS — Z87891 Personal history of nicotine dependence: Secondary | ICD-10-CM

## 2021-09-01 NOTE — Telephone Encounter (Signed)
Contacted patient regarding LDCT results.

## 2021-12-02 ENCOUNTER — Encounter: Payer: Self-pay | Admitting: Family Medicine

## 2021-12-02 ENCOUNTER — Ambulatory Visit: Payer: BLUE CROSS/BLUE SHIELD | Admitting: Family Medicine

## 2021-12-02 VITALS — BP 136/88 | HR 73 | Temp 98.5°F | Resp 16 | Wt 194.3 lb

## 2021-12-02 DIAGNOSIS — Z125 Encounter for screening for malignant neoplasm of prostate: Secondary | ICD-10-CM | POA: Diagnosis not present

## 2021-12-02 DIAGNOSIS — E785 Hyperlipidemia, unspecified: Secondary | ICD-10-CM

## 2021-12-02 DIAGNOSIS — I700 Atherosclerosis of aorta: Secondary | ICD-10-CM

## 2021-12-02 DIAGNOSIS — I10 Essential (primary) hypertension: Secondary | ICD-10-CM | POA: Diagnosis not present

## 2021-12-02 NOTE — Progress Notes (Signed)
I,Jeffrey Fernandez,acting as a scribe for Jeffrey Huh, MD.,have documented all relevant documentation on the behalf of Jeffrey Renfroe, MD,as directed by  Jeffrey Huh, MD while in the presence of Jeffrey Huh, MD.  Established patient visit   Patient: Jeffrey Fernandez   DOB: 13-Apr-1956   66 y.o. 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      Hypertension, follow-up  BP Readings from Last 3 Encounters:  12/02/21 136/88  08/17/21 138/82  04/30/21 123/78   Wt Readings from Last 3 Encounters:  12/02/21 194 lb 4.8 oz (88.1 kg)  08/17/21 199 lb (90.3 kg)  04/30/21 199 lb (90.3 kg)     He was last seen for hypertension 10 months ago.  BP at that visit was 120 72. Management since that visit includes: Continue current mediation.  He reports excellent compliance with treatment. He is not having side effects. He is following a Regular diet. He is not exercising. He does not smoke.  Use of agents associated with hypertension: none.   Outside blood pressures are: occasionally 125/86 Symptoms: No chest pain No chest pressure  No palpitations No syncope  No dyspnea No orthopnea  No paroxysmal nocturnal dyspnea No lower extremity edema   Pertinent labs Lab Results  Component Value Date   CHOL 184 05/05/2021   HDL 37 (L) 05/05/2021   LDLCALC 106 (H) 05/05/2021   TRIG 236 (H) 05/05/2021   CHOLHDL 5.0 05/05/2021   Lab Results  Component Value Date   NA 140 05/05/2021   K 4.1 05/05/2021   CREATININE 1.05 05/05/2021   EGFR 80 05/05/2021   GLUCOSE 98 05/05/2021   TSH 1.530 11/25/2018     The 10-year ASCVD risk score (Arnett DK, et al., 2019) is:  17%  ---------------------------------------------------------------------------------------------------   Medications: Outpatient Medications Prior to Visit  Medication Sig   aspirin 81 MG tablet Take 81 mg by mouth daily.   losartan-hydrochlorothiazide (HYZAAR) 50-12.5 MG tablet Take 1 tablet by mouth daily.   metoprolol succinate (TOPROL-XL) 25 MG 24 hr tablet Take 1 tablet (25 mg total) by mouth daily.   Multiple Vitamins-Minerals (OCUVITE ADULT FORMULA PO) Take 1 capsule by mouth.   pantoprazole (PROTONIX) 40 MG tablet Take 1 tablet (40 mg total) by mouth daily.   No facility-administered medications prior to visit.         Objective    BP 136/88 (BP Location: Right Arm, Patient Position: Sitting, Cuff Size: Normal)   Pulse 73   Temp 98.5 F (36.9 C) (Oral)   Resp 16   Wt 194 lb 4.8 oz (88.1 kg)   SpO2 98%   BMI 28.69 kg/m    Physical Exam    General: Appearance:     Overweight male in no acute distress  Eyes:    PERRL, conjunctiva/corneas clear, EOM's intact       Lungs:     Clear to auscultation bilaterally, respirations unlabored  Heart:    Normal heart rate. Normal rhythm. No murmurs, rubs, or gallops.    MS:   All extremities are intact.    Neurologic:   Awake, alert, oriented x 3. No apparent focal neurological defect.  Assessment & Plan     1. Essential (primary) hypertension Borderline control. See how labs look. Discussed increasing losartan dose of electrolytes are normal.  - Magnesium  2. Aortic atherosclerosis (HCC) Asymptomatic. Compliant with medication.  Continue aggressive risk factor modification.    3. Hyperlipidemia, unspecified hyperlipidemia type Currently diet controlled.  - CBC - Comprehensive metabolic panel - Lipid panel  4. Prostate cancer screening  - PSA Total (Reflex To Free) ([facility] only)      The entirety of the information documented in the History of Present Illness, Review of Systems and Physical Exam were  personally obtained by me. Portions of this information were initially documented by the CMA and reviewed by me for thoroughness and accuracy.     Jeffrey Huh, MD  [facility] [phone] (phone) [phone] (fax)  [place]

## 2021-12-03 ENCOUNTER — Other Ambulatory Visit: Payer: Self-pay | Admitting: Family Medicine

## 2021-12-03 DIAGNOSIS — I10 Essential (primary) hypertension: Secondary | ICD-10-CM

## 2021-12-03 LAB — COMPREHENSIVE METABOLIC PANEL
ALT: 30 IU/L (ref 0–44)
AST: 24 IU/L (ref 0–40)
Albumin/Globulin Ratio: 1.6 (ref 1.2–2.2)
Albumin: 4.7 g/dL (ref 3.8–4.8)
Alkaline Phosphatase: 99 IU/L (ref 44–121)
BUN/Creatinine Ratio: 10 (ref 10–24)
BUN: 12 mg/dL (ref 8–27)
Bilirubin Total: 0.7 mg/dL (ref 0.0–1.2)
CO2: 23 mmol/L (ref 20–29)
Calcium: 10.3 mg/dL — ABNORMAL HIGH (ref 8.6–10.2)
Chloride: 104 mmol/L (ref 96–106)
Creatinine, Ser: 1.17 mg/dL (ref 0.76–1.27)
Globulin, Total: 3 g/dL (ref 1.5–4.5)
Glucose: 97 mg/dL (ref 70–99)
Potassium: 4.2 mmol/L (ref 3.5–5.2)
Sodium: 142 mmol/L (ref 134–144)
Total Protein: 7.7 g/dL (ref 6.0–8.5)
eGFR: 70 mL/min/{1.73_m2} (ref >59)

## 2021-12-03 LAB — LIPID PANEL
Chol/HDL Ratio: 4 ratio (ref 0.0–5.0)
Cholesterol, Total: 185 mg/dL (ref 100–199)
HDL: 46 mg/dL (ref >39)
LDL Chol Calc (NIH): 114 mg/dL — ABNORMAL HIGH (ref 0–99)
Triglycerides: 142 mg/dL (ref 0–149)
VLDL Cholesterol Cal: 25 mg/dL (ref 5–40)

## 2021-12-03 LAB — CBC
Hematocrit: 46.9 % (ref 37.5–51.0)
Hemoglobin: 16.7 g/dL (ref 13.0–17.7)
MCH: 33.3 pg — ABNORMAL HIGH (ref 26.6–33.0)
MCHC: 35.6 g/dL (ref 31.5–35.7)
MCV: 93 fL (ref 79–97)
Platelets: 359 10*3/uL (ref 150–450)
RBC: 5.02 x10E6/uL (ref 4.14–5.80)
RDW: 12.5 % (ref 11.6–15.4)
WBC: 9.1 10*3/uL (ref 3.4–10.8)

## 2021-12-03 LAB — MAGNESIUM: Magnesium: 2.1 mg/dL (ref 1.6–2.3)

## 2021-12-03 LAB — PSA TOTAL (REFLEX TO FREE): Prostate Specific Ag, Serum: 2 ng/mL (ref 0.0–4.0)

## 2021-12-03 MED ORDER — LOSARTAN POTASSIUM-HCTZ 100-12.5 MG PO TABS: 1.0000 | ORAL_TABLET | Freq: Every day | ORAL | 2 refills | Status: DC

## 2021-12-24 ENCOUNTER — Ambulatory Visit: Payer: BLUE CROSS/BLUE SHIELD | Admitting: Urology

## 2021-12-24 ENCOUNTER — Encounter: Payer: Self-pay | Admitting: Urology

## 2021-12-24 ENCOUNTER — Ambulatory Visit: Payer: Self-pay | Admitting: Urology

## 2021-12-24 VITALS — BP 127/86 | HR 79 | Ht 70.0 in | Wt 198.0 lb

## 2021-12-24 DIAGNOSIS — R399 Unspecified symptoms and signs involving the genitourinary system: Secondary | ICD-10-CM | POA: Diagnosis not present

## 2021-12-24 DIAGNOSIS — N529 Male erectile dysfunction, unspecified: Secondary | ICD-10-CM | POA: Diagnosis not present

## 2021-12-24 DIAGNOSIS — N138 Other obstructive and reflux uropathy: Secondary | ICD-10-CM

## 2021-12-24 DIAGNOSIS — N401 Enlarged prostate with lower urinary tract symptoms: Secondary | ICD-10-CM

## 2021-12-24 LAB — BLADDER SCAN AMB NON-IMAGING

## 2021-12-24 MED ORDER — SILDENAFIL CITRATE 20 MG PO TABS: 20.0000 mg | ORAL_TABLET | Freq: Every day | ORAL | 11 refills | Status: DC | PRN

## 2021-12-24 NOTE — Progress Notes (Signed)
   12/24/2021 1:03 PM   Jeffrey Fernandez [birth year] [id number]  Reason for visit: Follow up history of bladder cancer, BPH, gross hematuria, PSA screening, ED  HPI: 66 year old male with distant history of bladder cancer 2013 who presented in 2021 with recurrent gross hematuria.  He underwent work-up with a CT and cystoscopy in December 2021 that showed a 67 g prostate but no evidence of recurrence, and some friable vessels at the prostate.  He was started on Flomax for some BPH symptoms of weak stream at that time, but did not notice any significant change in the urination and discontinued that medication.  His urinary symptoms are exacerbated by high alcohol/beer intake.  He recently started an over-the-counter prostate supplement based on saw palmetto with significant improvement in the urinary symptoms.  He reports his nocturia improved from 4 times overnight to once per night.  PVR is normal today at 112 mL.  He has been using sildenafil 20 to 40 mg on demand with good results for ED but needs a refill.  Denies any gross hematuria.  Recent PSA May 2023 normal and stable at 2.0.  -Sildenafil refilled 20 to 40 mg as needed -Okay to continue prostate supplement -Continue PSA screening via PCP -RTC 1 year PVR and symptom check   Billey Co, MD  [facility] [address], [place], [place] [address] [phone]

## 2022-01-29 ENCOUNTER — Other Ambulatory Visit: Payer: Self-pay | Admitting: Family Medicine

## 2022-01-29 DIAGNOSIS — R002 Palpitations: Secondary | ICD-10-CM

## 2022-01-29 DIAGNOSIS — I10 Essential (primary) hypertension: Secondary | ICD-10-CM

## 2022-01-29 DIAGNOSIS — K219 Gastro-esophageal reflux disease without esophagitis: Secondary | ICD-10-CM

## 2022-07-28 ENCOUNTER — Other Ambulatory Visit: Payer: Self-pay | Admitting: Family Medicine

## 2022-07-28 ENCOUNTER — Encounter: Payer: Self-pay | Admitting: Urology

## 2022-07-28 ENCOUNTER — Other Ambulatory Visit
Admission: RE | Admit: 2022-07-28 | Discharge: 2022-07-28 | Disposition: A | Discharge status: Home / Self Care | Payer: BLUE CROSS/BLUE SHIELD | Attending: Urology | Admitting: Urology

## 2022-07-28 ENCOUNTER — Ambulatory Visit: Payer: BLUE CROSS/BLUE SHIELD | Admitting: Urology

## 2022-07-28 ENCOUNTER — Other Ambulatory Visit: Payer: Self-pay

## 2022-07-28 VITALS — BP 144/90 | HR 108 | Temp 98.4°F | Ht 70.0 in | Wt 187.2 lb

## 2022-07-28 DIAGNOSIS — R310 Gross hematuria: Secondary | ICD-10-CM | POA: Diagnosis not present

## 2022-07-28 DIAGNOSIS — Z8551 Personal history of malignant neoplasm of bladder: Secondary | ICD-10-CM | POA: Diagnosis present

## 2022-07-28 DIAGNOSIS — N138 Other obstructive and reflux uropathy: Secondary | ICD-10-CM

## 2022-07-28 DIAGNOSIS — R1030 Lower abdominal pain, unspecified: Secondary | ICD-10-CM

## 2022-07-28 DIAGNOSIS — N401 Enlarged prostate with lower urinary tract symptoms: Secondary | ICD-10-CM | POA: Diagnosis not present

## 2022-07-28 DIAGNOSIS — I10 Essential (primary) hypertension: Secondary | ICD-10-CM

## 2022-07-28 LAB — URINALYSIS, COMPLETE (UACMP) WITH MICROSCOPIC
Bilirubin Urine: NEGATIVE
Glucose, UA: NEGATIVE mg/dL
Ketones, ur: NEGATIVE mg/dL
Leukocytes,Ua: NEGATIVE
Nitrite: NEGATIVE
Protein, ur: NEGATIVE mg/dL
Specific Gravity, Urine: 1.015 (ref 1.005–1.030)
Squamous Epithelial / HPF: NONE SEEN /HPF (ref 0–5)
pH: 7 (ref 5.0–8.0)

## 2022-07-28 LAB — BLADDER SCAN AMB NON-IMAGING

## 2022-07-28 MED ORDER — TAMSULOSIN HCL 0.4 MG PO CAPS: 0.4000 mg | ORAL_CAPSULE | Freq: Every day | ORAL | 11 refills | Status: DC

## 2022-07-28 NOTE — Progress Notes (Signed)
   07/28/2022 4:07 PM   Jeffrey Fernandez 1955/08/26 [id number]  Reason for visit: Gross hematuria, abdominal pain, history of BPH  HPI: 67 year old male with a distant history of bladder cancer in 2013 who presented in 2021 with gross hematuria.  Workup with CT and cystoscopy in December 2021 showed a 67 g prostate but no evidence of recurrence, and friable vessels at the prostate.  He was started on Flomax for BPH symptoms of weak stream but ultimately stopped that medication.  When I saw him last, he was taking an over-the-counter prostate supplement which he felt significantly improved his symptoms, and PVR at that time was normal at 112 mL.  PSA was normal at 2.0 in May 2023 and stable over the last few years.  He made an appointment today for a few weeks of lower abdominal and right groin pain, as well as 1 episode of gross hematuria with a small clot.  He does have some intermittent difficulty urinating, and PVR is elevated today at 2103m.  Urinalysis today is benign.  On exam, no skin lesions or obvious hernia in the groin, no testicular pain or tenderness.  No testicular masses.  We reviewed possible etiologies including BPH with incomplete bladder emptying with upstream hydronephrosis, nephrolithiasis, recurrence of bladder cancer, or other abdominal pathology.  In the setting of his gross hematuria I recommended a repeat CT urogram to start his evaluation, as well as resuming the Flomax to see if we can improve his emptying.  Call with CT urogram results   BBilley Co MD  [facility] SPicture [place] [phone]

## 2022-07-28 NOTE — Telephone Encounter (Signed)
Requested medication (s) are due for refill today: yes  Requested medication (s) are on the active medication list: yes  Last refill:  12/03/21 #90 2 refills  Future visit scheduled: no  Notes to clinic:  protocol failed. Last labs 12/02/21 . Do you want to refill Rx ?      Requested Prescriptions  Pending Prescriptions Disp Refills   losartan-hydrochlorothiazide (HYZAAR) 100-12.5 MG tablet [Pharmacy Med Name: LOSARTAN-HCTZ 100-12.5 MG TAB] 90 tablet 2    Sig: TAKE 1 TABLET BY MOUTH EVERY DAY     Cardiovascular: ARB + Diuretic Combos Failed - 07/28/2022  1:31 AM      Failed - K in normal range and within 180 days    Potassium  Date Value Ref Range Status  12/02/2021 4.2 3.5 - 5.2 mmol/L Final         Failed - Na in normal range and within 180 days    Sodium  Date Value Ref Range Status  12/02/2021 142 134 - 144 mmol/L Final         Failed - Cr in normal range and within 180 days    Creatinine, Ser  Date Value Ref Range Status  12/02/2021 1.17 0.76 - 1.27 mg/dL Final         Failed - eGFR is 10 or above and within 180 days    GFR calc Af Amer  Date Value Ref Range Status  06/10/2020 90 >59 mL/min/1.73 Final    Comment:    **In accordance with recommendations from the NKF-ASN Task force,**   [facility] is in the process of updating its eGFR calculation to the   2021 CKD-EPI creatinine equation that estimates kidney function   without a race variable.    GFR calc non Af Amer  Date Value Ref Range Status  06/10/2020 77 >59 mL/min/1.73 Final   eGFR  Date Value Ref Range Status  12/02/2021 70 >59 mL/min/1.73 Final         Failed - Valid encounter within last 6 months    Recent Outpatient Visits           7 months ago Essential (primary) hypertension   [facility] Birdie Sons, MD   1 year ago Diverticulitis   [facility] Birdie Sons, MD   2 years ago Hematuria, unspecified type   [facility] Flinchum, Kelby Aline, FNP   2 years ago Essential (primary) hypertension   [facility] Birdie Sons, MD   2 years ago Sinus congestion   [facility] Trinna Post, [place]       Future Appointments             Today Billey Co, MD [facility] [place]   In 5 months Billey Co, MD [facility] - Patient is not pregnant      Passed - Last BP in normal range    BP Readings from Last 1 Encounters:  12/24/21 127/86

## 2022-07-30 ENCOUNTER — Ambulatory Visit: Payer: BLUE CROSS/BLUE SHIELD

## 2022-07-31 ENCOUNTER — Telehealth: Payer: Self-pay | Admitting: Urology

## 2022-07-31 ENCOUNTER — Ambulatory Visit
Admission: RE | Admit: 2022-07-31 | Discharge: 2022-07-31 | Disposition: A | Discharge status: Home / Self Care | Payer: BLUE CROSS/BLUE SHIELD | Source: Ambulatory Visit | Attending: Urology | Admitting: Urology

## 2022-07-31 DIAGNOSIS — R599 Enlarged lymph nodes, unspecified: Secondary | ICD-10-CM

## 2022-07-31 DIAGNOSIS — R310 Gross hematuria: Secondary | ICD-10-CM | POA: Insufficient documentation

## 2022-07-31 DIAGNOSIS — N2889 Other specified disorders of kidney and ureter: Secondary | ICD-10-CM

## 2022-07-31 DIAGNOSIS — R918 Other nonspecific abnormal finding of lung field: Secondary | ICD-10-CM

## 2022-07-31 LAB — POCT I-STAT CREATININE: Creatinine, Ser: 1.2 mg/dL (ref 0.61–1.24)

## 2022-07-31 MED ORDER — IOHEXOL 300 MG/ML  SOLN
100.0000 mL | Freq: Once | INTRAMUSCULAR | Status: AC | PRN
  Administered 2022-07-31: 100 mL via INTRAVENOUS

## 2022-07-31 NOTE — Telephone Encounter (Signed)
Called report from radiology regarding CT urogram on this patient with significant abnormalities suspicious for metastatic urothelial carcinoma.  Patient was contacted to discuss results.  Left message on voicemail to contact office.

## 2022-08-03 NOTE — Addendum Note (Signed)
Addended by: Abbie Sons on: 08/03/2022 01:31 PM   Modules accepted: Orders

## 2022-08-03 NOTE — Telephone Encounter (Signed)
Patient reached out to after hours over the weekend with complaints of worsening pain and chills. I spoke to patient and he states the pain is just continuous and he does not think he needs to go to the ER for further evaluation. He is waiting on further instruction on the recent Ct report. I informed him that we will put in a referral to Oncology and they will be in touch to get an appointment.

## 2022-08-03 NOTE — Telephone Encounter (Signed)
I spoke with Jeffrey Fernandez regarding his CT urogram and suspicion for metastatic urothelial carcinoma.  Oncology referral placed.  We discussed he will need tissue confirmation and hopefully one of the lymph nodes will be amenable to biopsy by IR.  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

## 2022-08-04 ENCOUNTER — Telehealth: Payer: Self-pay

## 2022-08-04 NOTE — Telephone Encounter (Signed)
LVM to confirm new patient appointment.

## 2022-08-05 ENCOUNTER — Inpatient Hospital Stay: Payer: BLUE CROSS/BLUE SHIELD | Attending: Oncology | Admitting: Oncology

## 2022-08-05 ENCOUNTER — Ambulatory Visit
Admission: RE | Admit: 2022-08-05 | Discharge: 2022-08-05 | Disposition: A | Discharge status: Home / Self Care | Payer: BLUE CROSS/BLUE SHIELD | Source: Ambulatory Visit | Attending: Acute Care | Admitting: Acute Care

## 2022-08-05 ENCOUNTER — Encounter: Payer: Self-pay | Admitting: Oncology

## 2022-08-05 ENCOUNTER — Inpatient Hospital Stay: Payer: BLUE CROSS/BLUE SHIELD

## 2022-08-05 VITALS — BP 123/88 | HR 98 | Temp 98.1°F | Resp 16 | Ht 70.0 in | Wt 181.5 lb

## 2022-08-05 DIAGNOSIS — Z87891 Personal history of nicotine dependence: Secondary | ICD-10-CM | POA: Insufficient documentation

## 2022-08-05 DIAGNOSIS — R591 Generalized enlarged lymph nodes: Secondary | ICD-10-CM | POA: Insufficient documentation

## 2022-08-05 DIAGNOSIS — R918 Other nonspecific abnormal finding of lung field: Secondary | ICD-10-CM | POA: Insufficient documentation

## 2022-08-05 DIAGNOSIS — N2889 Other specified disorders of kidney and ureter: Secondary | ICD-10-CM | POA: Diagnosis present

## 2022-08-05 DIAGNOSIS — J439 Emphysema, unspecified: Secondary | ICD-10-CM | POA: Diagnosis not present

## 2022-08-05 DIAGNOSIS — Z8551 Personal history of malignant neoplasm of bladder: Secondary | ICD-10-CM | POA: Insufficient documentation

## 2022-08-05 DIAGNOSIS — I700 Atherosclerosis of aorta: Secondary | ICD-10-CM | POA: Diagnosis not present

## 2022-08-05 DIAGNOSIS — R52 Pain, unspecified: Secondary | ICD-10-CM | POA: Diagnosis not present

## 2022-08-05 DIAGNOSIS — Z122 Encounter for screening for malignant neoplasm of respiratory organs: Secondary | ICD-10-CM | POA: Insufficient documentation

## 2022-08-05 DIAGNOSIS — Z79899 Other long term (current) drug therapy: Secondary | ICD-10-CM | POA: Insufficient documentation

## 2022-08-05 MED ORDER — HYDROCODONE-ACETAMINOPHEN 5-325 MG PO TABS: 1.0000 | ORAL_TABLET | Freq: Four times a day (QID) | ORAL | 0 refills | Status: DC | PRN

## 2022-08-05 NOTE — Progress Notes (Unsigned)
Hx of bladder cancer about 12 years.

## 2022-08-06 ENCOUNTER — Telehealth: Payer: Self-pay | Admitting: *Deleted

## 2022-08-06 ENCOUNTER — Telehealth: Payer: Self-pay | Admitting: Acute Care

## 2022-08-06 NOTE — Telephone Encounter (Signed)
Results/plan faxed to PCP

## 2022-08-06 NOTE — Telephone Encounter (Signed)
Call placed to patient to review appointment details for biopsy, patient is scheduled for Wednesday 1/31 at 10:30 am, patient to arrive at 9:30 am. Patient to hold aspirin after todays dose until after biopsy. Patient verbalized understanding of plan and encouraged to call with any questions or concerns.

## 2022-08-06 NOTE — Progress Notes (Signed)
[name]  [phone] [phone]  ID: Jeffrey Fernandez OB: 08-19-1955  MR#: [id number]  QTM#:[id number]  Patient Care Team: Jeffrey Sons, MD as PCP - General (Family Medicine) Jeffrey Co, MD as Consulting Physician (Urology) Jeffrey Huger, MD as Consulting Physician (Oncology)  CHIEF COMPLAINT: Right kidney mass.  INTERVAL HISTORY: Patient is a 67 year old male with a distant history of noninvasive bladder cancer who recently developed right flank and groin pain that was associated with hematuria.  Subsequent workup included CT scan which revealed a 7 cm infiltrative right kidney mass.  He otherwise feels well.  He has no neurologic complaints.  He denies any recent fevers or illnesses.  He has a fair appetite and denies weight loss.  He has no chest pain, shortness of breath, cough, or hemoptysis.  He denies any nausea, vomiting, constipation, or diarrhea.  He has no other urinary complaints.  Patient offers no further specific complaints today.  REVIEW OF SYSTEMS:   Review of Systems  Constitutional: Negative.  Negative for fever, malaise/fatigue and weight loss.  Respiratory: Negative.  Negative for cough, hemoptysis and shortness of breath.   Cardiovascular: Negative.  Negative for chest pain and leg swelling.  Gastrointestinal: Negative.  Negative for abdominal pain.  Genitourinary:  Positive for flank pain and hematuria.  Musculoskeletal:  Negative for back pain.  Skin: Negative.  Negative for rash.  Neurological: Negative.  Negative for dizziness, focal weakness, weakness and headaches.  Psychiatric/Behavioral: Negative.  The patient is not nervous/anxious.     As per HPI. Otherwise, a complete review of systems is negative.  PAST MEDICAL HISTORY: Past Medical History:  Diagnosis Date   Cancer ([place]) 2013   bladder   Diverticulitis    History of chickenpox    History of measles    History of mumps    Hyperlipidemia     Hypertension     PAST SURGICAL HISTORY: Past Surgical History:  Procedure Laterality Date   HERNIA REPAIR  2002,2003   SIGMOIDOSCOPY  1998   [facility]   TRANSURETHRAL RESECTION OF BLADDER TUMOR  05/11/2012   Dr. Sherral Hammers, [place]  2002   Bilateral inguinal herniorrhaphies    FAMILY HISTORY: Family History  Problem Relation Age of Onset   COPD Mother    Diverticulosis Mother    Diverticulosis Father    Diabetes Father    Hypertension Father    Diverticulosis Sister    Stroke Paternal Grandmother     ADVANCED DIRECTIVES (Y/N):  N  HEALTH MAINTENANCE: Social History   Tobacco Use   Smoking status: Former    Packs/day: 2.00    Years: 39.00    Total pack years: 78.00    Types: Cigarettes    Quit date: 07/14/2011    Years since quitting: 11.0    Passive exposure: Past   Smokeless tobacco: Never   Tobacco comments:    smoked 1 to 1 1/2  ppd for 35 years  Vaping Use   Vaping Use: Never used  Substance Use Topics   Alcohol use: Yes    Alcohol/week: 0.0 standard drinks of alcohol    Comment: drinks 2 beers daily   Drug use: No     Colonoscopy:  PAP:  Bone density:  Lipid panel:  Allergies  Allergen Reactions   Amoxicillin Itching   Atenolol Diarrhea    Other reaction(s): HIVES   Other Nausea And Vomiting    Navy Beans   Oxycodone  Other reaction(s): HIVES   Shellfish Allergy Itching and Swelling    Current Outpatient Medications  Medication Sig Dispense Refill   aspirin 81 MG tablet Take 81 mg by mouth daily.     HYDROcodone-acetaminophen (NORCO) 5-325 MG tablet Take 1 tablet by mouth every 6 (six) hours as needed for moderate pain. 120 tablet 0   losartan-hydrochlorothiazide (HYZAAR) 100-12.5 MG tablet TAKE 1 TABLET BY MOUTH EVERY DAY 90 tablet 0   metoprolol succinate (TOPROL-XL) 25 MG 24 hr tablet TAKE 1 TABLET (25 MG TOTAL) BY MOUTH DAILY. 90 tablet 2   Multiple Vitamins-Minerals (OCUVITE ADULT FORMULA PO) Take 1  capsule by mouth.     pantoprazole (PROTONIX) 40 MG tablet TAKE 1 TABLET BY MOUTH EVERY DAY 90 tablet 2   sildenafil (REVATIO) 20 MG tablet Take 1-3 tablets (20-60 mg total) by mouth daily as needed. 30 tablet 11   Specialty Vitamins Products ([employer] PROSTATE HEALTH FORMULA PO)      tamsulosin (FLOMAX) 0.4 MG CAPS capsule Take 1 capsule (0.4 mg total) by mouth daily. 30 capsule 11   No current facility-administered medications for this visit.    OBJECTIVE: Vitals:   08/05/22 1023  BP: 123/88  Pulse: 98  Resp: 16  Temp: 98.1 F (36.7 C)  SpO2: 99%     Body mass index is 26.04 kg/m.    ECOG FS:1 - Symptomatic but completely ambulatory  General: Well-developed, well-nourished, no acute distress. Eyes: Pink conjunctiva, anicteric sclera. HEENT: Normocephalic, moist mucous membranes. Lungs: No audible wheezing or coughing. Heart: Regular rate and rhythm. Abdomen: Soft, nontender, no obvious distention. Musculoskeletal: No edema, cyanosis, or clubbing. Neuro: Alert, answering all questions appropriately. Cranial nerves grossly intact. Skin: No rashes or petechiae noted. Psych: Normal affect. Lymphatics: No cervical, calvicular, axillary or inguinal LAD.   LAB RESULTS:  Lab Results  Component Value Date   NA 142 12/02/2021   K 4.2 12/02/2021   CL 104 12/02/2021   CO2 23 12/02/2021   GLUCOSE 97 12/02/2021   BUN 12 12/02/2021   CREATININE 1.20 07/31/2022   CALCIUM 10.3 (H) 12/02/2021   PROT 7.7 12/02/2021   ALBUMIN 4.7 12/02/2021   AST 24 12/02/2021   ALT 30 12/02/2021   ALKPHOS 99 12/02/2021   BILITOT 0.7 12/02/2021   GFRNONAA 77 06/10/2020   GFRAA 90 06/10/2020    Lab Results  Component Value Date   WBC 9.1 12/02/2021   NEUTROABS 4.7 06/10/2020   HGB 16.7 12/02/2021   HCT 46.9 12/02/2021   MCV 93 12/02/2021   PLT 359 12/02/2021     STUDIES: CT HEMATURIA WORKUP  Result Date: 07/31/2022 CLINICAL DATA:  Abdominal pain with gross hematuria. History of bladder  cancer. * Tracking Code: BO * EXAM: CT ABDOMEN AND PELVIS WITHOUT AND WITH CONTRAST TECHNIQUE: Multidetector CT imaging of the abdomen and pelvis was performed following the standard protocol before and following the bolus administration of intravenous contrast. RADIATION DOSE REDUCTION: This exam was performed according to the departmental dose-optimization program which includes automated exposure control, adjustment of the mA and/or kV according to patient size and/or use of iterative reconstruction technique. CONTRAST:  126m OMNIPAQUE IOHEXOL 300 MG/ML  SOLN COMPARISON:  06/19/2020 FINDINGS: Lower chest: New bilateral pulmonary nodules are too numerous to count. Index left upper lobe nodule measures 9 mm on image 6/17. Index right lower lobe nodule measures 9 mm on 19/17. Index left lower lobe nodule measures 7 mm on 13/17. No pleural effusion. Hepatobiliary: No suspicious focal abnormality within  the liver parenchyma. There is no evidence for gallstones, gallbladder wall thickening, or pericholecystic fluid. No intrahepatic or extrahepatic biliary dilation. Pancreas: No focal mass lesion. No dilatation of the main duct. No intraparenchymal cyst. No peripancreatic edema. Spleen: No splenomegaly. No focal mass lesion. Adrenals/Urinary Tract: No adrenal nodule or mass. Since 06/19/2020, the patient has developed a poorly defined 6.0 x 4.6 x 7.3 cm hypoenhancing mass in the upper and posterior interpolar region of the right kidney. This lesion has an almost infiltrative appearance in some regions with persistent central hypoenhancement on delayed imaging. Involvement of a posterior calyx is best appreciated on axial 41/7. Abnormal soft tissue tracks around the right renal artery, but no evidence for filling defect in the right renal vein. 2.5 cm exophytic simple cyst noted upper pole left kidney. No hydroureter.  No focal bladder wall abnormality. Stomach/Bowel: Stomach is unremarkable. No gastric wall thickening.  No evidence of outlet obstruction. Duodenum is normally positioned as is the ligament of Treitz. No small bowel wall thickening. No small bowel dilatation. The terminal ileum is normal. The appendix is normal. No gross colonic mass. No colonic wall thickening. Vascular/Lymphatic: There is mild atherosclerotic calcification of the abdominal aorta without aneurysm. Confluent lymphadenopathy is identified in the right retroperitoneal space with 2.3 cm short axis retrocaval node visible on 37/7 and a 1.6 cm short axis aortocaval node visible on 40/7. Index 1.7 cm precaval node is visible lower in the pelvis on 50/7. This lymphadenopathy does generates some mass-effect on the IVC. No pelvic sidewall lymphadenopathy. Reproductive: Prostate gland is enlarged. Other: No intraperitoneal free fluid. Musculoskeletal: No worrisome lytic or sclerotic osseous abnormality. Degenerative disc disease noted L2-3 and L3-4. Bilateral pars interarticularis defects noted at L5. IMPRESSION: 1. Interval development of a poorly defined 6.0 x 4.6 x 7.3 cm hypoenhancing mass in the upper and posterior interpolar region of the right kidney. This lesion has an almost infiltrative appearance in some areas with persistent central hypoenhancement on delayed imaging. Lesion extends into a posterior interpolar calyx. Abnormal soft tissue tracks around the right renal artery, but no evidence for filling defect in the right renal vein. Imaging features are compatible with neoplasm. Transitional cell carcinoma is somewhat favored given the hypoenhancement although clear cell renal cell carcinoma could also have this appearance. 2. Confluent lymphadenopathy in the right retroperitoneal space, consistent with metastatic disease. 3. New bilateral pulmonary nodules are too numerous to count, consistent with metastatic disease. 4. Prostatomegaly. 5. 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 6.  Aortic  Atherosclerosis (ICD10-I70.0). These results will be called to the ordering clinician or representative by the Radiologist Assistant, and communication documented in the PACS or [employer]. Electronically Signed   By: Misty Stanley M.D.   On: 07/31/2022 11:41    ASSESSMENT: Right kidney mass.  PLAN:    Right kidney mass: CT scan results from July 31, 2022 reviewed independently and reported as above with a 6.0 x 4.6 x 7.3 hypoenhancing infiltrative mass of the right kidney highly suspicious for underlying malignancy.  This lesion was not evident on CT scan from June 19, 2020.  Patient also noted to have confluent retroperitoneal lymphadenopathy as well as bilateral pulmonary nodules too numerous to count.  Will get a PET scan and biopsy within the next week to determine diagnosis and staging.  Patient also may require MRI of the brain.  Return to clinic 1 week after his biopsy to discuss the results and treatment planning.  Pain: Patient was given a prescription for Norco today. History of bladder cancer: Greater than 10 years ago.  Likely noninvasive.  I spent a total of 60 minutes reviewing chart data, face-to-face evaluation with the patient, counseling and coordination of care as detailed above.   Patient expressed understanding and was in agreement with this plan. He also understands that He can call clinic at any time with any questions, concerns, or complaints.    Cancer Staging  No matching staging information was found for the patient.  Jeffrey Huger, MD   08/06/2022 6:22 AM

## 2022-08-06 NOTE — Telephone Encounter (Signed)
Suspect that the lesions are metastatic.  They are very small and numerous.  He would be to get biopsy of the renal process.  But we will be on the look out for potential need for further pulmonary evaluation.

## 2022-08-06 NOTE — Telephone Encounter (Signed)
I have called the patient with the results of his low dose Ct chest. The scan was read as a LR 4X. There are Numerous solid pulmonary nodules, highly concerning for metastatic disease given presence of a large renal mass on recent CT of the abdomen and pelvis. Pt has a remote history of non-invasive bladder cancer, and recently developed right flank pain and hematuria.  He states he has lost weigh. When he weighed 06/13/22 his weight was 198 lbs, and he weighed recently his weight was 182 pounds. He states he has a poor appetite as he is uncomfortable. Pt. Was seen by Dr. Grayland Ormond 08/05/2022. He has ordered a PET scan which is scheduled for 08/14/2022. Once we review the PET scan he most likely will need biopsy which I know Dr. Grayland Ormond is planning of the kidney. We may need to coordinate and biopsy one of the pulmonary nodules also so that we can determine the primary cancer vs metastatic disease.  Dr. Darnell Level I have included you on this as we may need to coordinate with Dr. Grayland Ormond regarding biopsies. I discussed the above with the patient . He is in agreement with the best plan moving forward. Langley Gauss, please fax results to PCP and let them know plan fore care. Thanks so much

## 2022-08-11 ENCOUNTER — Other Ambulatory Visit: Payer: Self-pay | Admitting: Internal Medicine

## 2022-08-11 DIAGNOSIS — N2889 Other specified disorders of kidney and ureter: Secondary | ICD-10-CM

## 2022-08-11 NOTE — Progress Notes (Signed)
Patient for CT guided RT Renal Mass Bx vs Retroperitoneal LN Bx (IR Dr choice- get consent for both per Dr Pascal Lux) on Wed 08/12/2022, I called and spoke with the patient on the phone and gave pre-procedure instructions.  Pt was made aware to be here at 9:30a, NPO after MN prior to procedure as well as driver post procedure/recovery/discharge. Pt stated understanding. Called 08/11/2022

## 2022-08-12 ENCOUNTER — Ambulatory Visit
Admission: RE | Admit: 2022-08-12 | Discharge: 2022-08-12 | Disposition: A | Discharge status: Home / Self Care | Payer: BLUE CROSS/BLUE SHIELD | Source: Ambulatory Visit | Attending: Oncology | Admitting: Oncology

## 2022-08-12 ENCOUNTER — Other Ambulatory Visit: Payer: Self-pay

## 2022-08-12 DIAGNOSIS — R591 Generalized enlarged lymph nodes: Secondary | ICD-10-CM

## 2022-08-12 DIAGNOSIS — N2889 Other specified disorders of kidney and ureter: Secondary | ICD-10-CM | POA: Insufficient documentation

## 2022-08-12 DIAGNOSIS — I10 Essential (primary) hypertension: Secondary | ICD-10-CM | POA: Insufficient documentation

## 2022-08-12 DIAGNOSIS — Z8551 Personal history of malignant neoplasm of bladder: Secondary | ICD-10-CM | POA: Insufficient documentation

## 2022-08-12 DIAGNOSIS — C679 Malignant neoplasm of bladder, unspecified: Secondary | ICD-10-CM | POA: Diagnosis not present

## 2022-08-12 DIAGNOSIS — E785 Hyperlipidemia, unspecified: Secondary | ICD-10-CM | POA: Insufficient documentation

## 2022-08-12 LAB — CBC WITH DIFFERENTIAL/PLATELET
Abs Immature Granulocytes: 0.06 10*3/uL (ref 0.00–0.07)
Basophils Absolute: 0.1 10*3/uL (ref 0.0–0.1)
Basophils Relative: 1 %
Eosinophils Absolute: 0.2 10*3/uL (ref 0.0–0.5)
Eosinophils Relative: 1 %
HCT: 44.3 % (ref 39.0–52.0)
Hemoglobin: 15.2 g/dL (ref 13.0–17.0)
Immature Granulocytes: 0 %
Lymphocytes Relative: 11 %
Lymphs Abs: 1.5 10*3/uL (ref 0.7–4.0)
MCH: 31.5 pg (ref 26.0–34.0)
MCHC: 34.3 g/dL (ref 30.0–36.0)
MCV: 91.9 fL (ref 80.0–100.0)
Monocytes Absolute: 1.3 10*3/uL — ABNORMAL HIGH (ref 0.1–1.0)
Monocytes Relative: 9 %
Neutro Abs: 10.6 10*3/uL — ABNORMAL HIGH (ref 1.7–7.7)
Neutrophils Relative %: 78 %
Platelets: 503 10*3/uL — ABNORMAL HIGH (ref 150–400)
RBC: 4.82 MIL/uL (ref 4.22–5.81)
RDW: 11.7 % (ref 11.5–15.5)
WBC: 13.7 10*3/uL — ABNORMAL HIGH (ref 4.0–10.5)
nRBC: 0 % (ref 0.0–0.2)

## 2022-08-12 LAB — BASIC METABOLIC PANEL
Anion gap: 9 (ref 5–15)
BUN: 14 mg/dL (ref 8–23)
CO2: 27 mmol/L (ref 22–32)
Calcium: 10.1 mg/dL (ref 8.9–10.3)
Chloride: 98 mmol/L (ref 98–111)
Creatinine, Ser: 1.08 mg/dL (ref 0.61–1.24)
GFR, Estimated: >60 mL/min (ref >60)
Glucose, Bld: 117 mg/dL — ABNORMAL HIGH (ref 70–99)
Potassium: 3.7 mmol/L (ref 3.5–5.1)
Sodium: 134 mmol/L — ABNORMAL LOW (ref 135–145)

## 2022-08-12 LAB — PROTIME-INR
INR: 1.1 (ref 0.8–1.2)
Prothrombin Time: 13.9 seconds (ref 11.4–15.2)

## 2022-08-12 MED ORDER — MIDAZOLAM HCL 2 MG/2ML IJ SOLN
INTRAMUSCULAR | Status: AC | PRN
  Administered 2022-08-12: 1 mg via INTRAVENOUS

## 2022-08-12 MED ORDER — FENTANYL CITRATE (PF) 100 MCG/2ML IJ SOLN
INTRAMUSCULAR | Status: AC | PRN
  Administered 2022-08-12: 50 ug via INTRAVENOUS

## 2022-08-12 MED ORDER — LIDOCAINE HCL (PF) 1 % IJ SOLN: 10.0000 mL | Freq: Once | INTRAMUSCULAR | Status: DC

## 2022-08-12 MED ORDER — SODIUM CHLORIDE 0.9 % IV SOLN: INTRAVENOUS | Status: DC

## 2022-08-12 MED ORDER — FENTANYL CITRATE (PF) 100 MCG/2ML IJ SOLN
INTRAMUSCULAR | Status: AC
  Filled 2022-08-12: qty 2

## 2022-08-12 MED ORDER — MIDAZOLAM HCL 2 MG/2ML IJ SOLN
INTRAMUSCULAR | Status: AC
  Filled 2022-08-12: qty 2

## 2022-08-12 NOTE — Procedures (Signed)
Interventional Radiology Procedure Note  Procedure: CT CORE BX RT RP ADENOPATHY    Complications: None  Estimated Blood Loss:  0  Findings: 18G CORES IN Otis Brace, MD

## 2022-08-12 NOTE — H&P (Signed)
Chief Complaint: Patient was seen in consultation today for retroperitoneal lymphadenopathy  Referring Physician(s): Finnegan,Timothy J  Supervising Physician: Daryll Brod  Patient Status: [facility] - Out-pt  History of Present Illness: Jeffrey Fernandez is a 67 y.o. male with PMH significant for bladder cancer, hyperlipidemia, and hypertension being seen today in relation to newly discovered right renal mass and retroperitoneal lymphadenopathy. The patient underwent CT Abdomen/Pelvis on 07/31/22 due to gross hematuria with a history of bladder cancer which revealed a 6.0 x 4.6 x 7.3 cm mass of the right kidney and confluent lymphadenopathy in the right retroperitoneal space. The patient was subsequently referred to IR for image-guided biopsy.  Past Medical History:  Diagnosis Date   Cancer [facility]) 2013   bladder   Diverticulitis    History of chickenpox    History of measles    History of mumps    Hyperlipidemia    Hypertension     Past Surgical History:  Procedure Laterality Date   HERNIA REPAIR  2002,2003   SIGMOIDOSCOPY  1998   [facility]   TRANSURETHRAL RESECTION OF BLADDER TUMOR  05/11/2012   Dr. Sherral Hammers, [facility]   VASECTOMY  2002   Bilateral inguinal herniorrhaphies    Allergies: Amoxicillin, Atenolol, Other, Oxycodone, and Shellfish allergy  Medications: Prior to Admission medications   Medication Sig Start Date End Date Taking? Authorizing Provider  aspirin 81 MG tablet Take 81 mg by mouth daily.    [provider]  HYDROcodone-acetaminophen (NORCO) 5-325 MG tablet Take 1 tablet by mouth every 6 (six) hours as needed for moderate pain. 08/05/22   Lloyd Huger, MD  losartan-hydrochlorothiazide (HYZAAR) 100-12.5 MG tablet TAKE 1 TABLET BY MOUTH EVERY DAY 07/29/22   Birdie Sons, MD  metoprolol succinate (TOPROL-XL) 25 MG 24 hr tablet TAKE 1 TABLET (25 MG TOTAL) BY MOUTH DAILY. 01/29/22   Birdie Sons, MD  Multiple Vitamins-Minerals  (OCUVITE ADULT FORMULA PO) Take 1 capsule by mouth.    [provider]  pantoprazole (PROTONIX) 40 MG tablet TAKE 1 TABLET BY MOUTH EVERY DAY 01/29/22   Birdie Sons, MD  sildenafil (REVATIO) 20 MG tablet Take 1-3 tablets (20-60 mg total) by mouth daily as needed. 12/24/21   Billey Co, MD  Specialty Vitamins Products ([facility] PROSTATE HEALTH FORMULA PO)  11/28/21   [provider]  tamsulosin (FLOMAX) 0.4 MG CAPS capsule Take 1 capsule (0.4 mg total) by mouth daily. 07/28/22   Billey Co, MD     Family History  Problem Relation Age of Onset   COPD Mother    Diverticulosis Mother    Diverticulosis Father    Diabetes Father    Hypertension Father    Diverticulosis Sister    Stroke Paternal Grandmother     Social History   Socioeconomic History   Marital status: Married    Spouse name: Not on file   Number of children: 3   Years of education: Not on file   Highest education level: Not on file  Occupational History   Occupation: Truck Geophysicist/field seismologist  Tobacco Use   Smoking status: Former    Packs/day: 2.00    Years: 39.00    Total pack years: 78.00    Types: Cigarettes    Quit date: 07/14/2011    Years since quitting: 11.0    Passive exposure: Past   Smokeless tobacco: Never   Tobacco comments:    smoked 1 to 1 1/2  ppd for 35 years  35  Use   Vaping Use: Never used  Substance and Sexual Activity   Alcohol use: Yes    Alcohol/week: 0.0 standard drinks of alcohol    Comment: drinks 2 beers daily   Drug use: No   Sexual activity: Not on file  Other Topics Concern   Not on file  Social History Narrative   Not on file   Social Determinants of Health   Financial Resource Strain: Not on file  Food Insecurity: Not on file  Transportation Needs: Not on file  Physical Activity: Not on file  Stress: Not on file  Social Connections: Not on file     Review of Systems: A 12 point ROS discussed and pertinent positives are indicated in the HPI above.   All other systems are negative.  Review of Systems  Constitutional:  Positive for chills and fatigue. Negative for fever.       Patient endorses intermittent chills especially at night  Respiratory:  Negative for chest tightness and shortness of breath.   Cardiovascular:  Negative for chest pain and leg swelling.  Gastrointestinal:  Positive for abdominal pain and nausea. Negative for diarrhea and vomiting.  Neurological:  Positive for headaches. Negative for dizziness.  Psychiatric/Behavioral:  Negative for confusion.     Vital Signs: BP (!) 138/97   Pulse (!) 104   Temp 98.3 F (36.8 C) (Oral)   Resp 20   Ht '5\' 9"'$  (1.753 m)   Wt 171 lb (77.6 kg)   SpO2 98%   BMI 25.25 kg/m     Physical Exam Vitals reviewed.  Constitutional:      General: He is not in acute distress. HENT:     Mouth/Throat:     Mouth: Mucous membranes are moist.  Cardiovascular:     Rate and Rhythm: Regular rhythm. Tachycardia present.     Pulses: Normal pulses.     Heart sounds: Normal heart sounds.  Pulmonary:     Effort: Pulmonary effort is normal.     Breath sounds: Normal breath sounds.  Abdominal:     Palpations: Abdomen is soft.     Tenderness: There is abdominal tenderness.  Musculoskeletal:     Right lower leg: No edema.     Left lower leg: No edema.  Skin:    General: Skin is warm and dry.  Neurological:     Mental Status: He is alert and oriented to person, place, and time.  Psychiatric:        Mood and Affect: Mood normal.        Behavior: Behavior normal.        Thought Content: Thought content normal.        Judgment: Judgment normal.     Imaging: CT CHEST LUNG CA SCREEN LOW DOSE W/O CM  Result Date: 08/06/2022 CLINICAL DATA:  Former smoker with 70 pack-year history EXAM: CT CHEST WITHOUT CONTRAST LOW-DOSE FOR LUNG CANCER SCREENING TECHNIQUE: Multidetector CT imaging of the chest was performed following the standard protocol without IV contrast. RADIATION DOSE REDUCTION:  This exam was performed according to the departmental dose-optimization program which includes automated exposure control, adjustment of the mA and/or kV according to patient size and/or use of iterative reconstruction technique. COMPARISON:  Lung cancer screening CT dated August 05, 2021; CT of the abdomen and pelvis dated July 31, 2022 FINDINGS: Cardiovascular: Normal heart size. No pericardial effusion. Normal caliber thoracic aorta with mild calcified plaque. Mediastinum/Nodes: Esophagus and thyroid are unremarkable. No pathologically enlarged lymph nodes seen in  the chest. Lungs/Pleura: Central airways are patent. No consolidation, pleural effusion or pneumothorax. Numerous bilateral solid pulmonary nodules nodules which on recent prior CT of the abdomen and pelvis are unchanged when compared with most recent prior lung cancer screening CT nodules are new. Reference nodule of the right lower lobe measuring 12 mm in mean diameter on image 166. Reference nodule of the left lower lobe measuring 11.4 mm in mean diameter on image 179. Upper Abdomen: No acute abnormality. Musculoskeletal: No chest wall mass or suspicious bone lesions identified. IMPRESSION: 1. Numerous solid pulmonary nodules, highly concerning for metastatic disease given presence of renal mass on recent CT of the abdomen and pelvis. Lung-RADS 4X, highly suspicious. Additional imaging evaluation or consultation with Pulmonology or Thoracic Surgery recommended. 2. Aortic Atherosclerosis (ICD10-I70.0) and Emphysema (ICD10-J43.9). Electronically Signed   By: Yetta Glassman M.D.   On: 08/06/2022 09:13  CT HEMATURIA WORKUP  Result Date: 07/31/2022 CLINICAL DATA:  Abdominal pain with gross hematuria. History of bladder cancer. * Tracking Code: BO * EXAM: CT ABDOMEN AND PELVIS WITHOUT AND WITH CONTRAST TECHNIQUE: Multidetector CT imaging of the abdomen and pelvis was performed following the standard protocol before and following the bolus  administration of intravenous contrast. RADIATION DOSE REDUCTION: This exam was performed according to the departmental dose-optimization program which includes automated exposure control, adjustment of the mA and/or kV according to patient size and/or use of iterative reconstruction technique. CONTRAST:  156m OMNIPAQUE IOHEXOL 300 MG/ML  SOLN COMPARISON:  06/19/2020 FINDINGS: Lower chest: New bilateral pulmonary nodules are too numerous to count. Index left upper lobe nodule measures 9 mm on image 6/17. Index right lower lobe nodule measures 9 mm on 19/17. Index left lower lobe nodule measures 7 mm on 13/17. No pleural effusion. Hepatobiliary: No suspicious focal abnormality within the liver parenchyma. There is no evidence for gallstones, gallbladder wall thickening, or pericholecystic fluid. No intrahepatic or extrahepatic biliary dilation. Pancreas: No focal mass lesion. No dilatation of the main duct. No intraparenchymal cyst. No peripancreatic edema. Spleen: No splenomegaly. No focal mass lesion. Adrenals/Urinary Tract: No adrenal nodule or mass. Since 06/19/2020, the patient has developed a poorly defined 6.0 x 4.6 x 7.3 cm hypoenhancing mass in the upper and posterior interpolar region of the right kidney. This lesion has an almost infiltrative appearance in some regions with persistent central hypoenhancement on delayed imaging. Involvement of a posterior calyx is best appreciated on axial 41/7. Abnormal soft tissue tracks around the right renal artery, but no evidence for filling defect in the right renal vein. 2.5 cm exophytic simple cyst noted upper pole left kidney. No hydroureter.  No focal bladder wall abnormality. Stomach/Bowel: Stomach is unremarkable. No gastric wall thickening. No evidence of outlet obstruction. Duodenum is normally positioned as is the ligament of Treitz. No small bowel wall thickening. No small bowel dilatation. The terminal ileum is normal. The appendix is normal. No gross  colonic mass. No colonic wall thickening. Vascular/Lymphatic: There is mild atherosclerotic calcification of the abdominal aorta without aneurysm. Confluent lymphadenopathy is identified in the right retroperitoneal space with 2.3 cm short axis retrocaval node visible on 37/7 and a 1.6 cm short axis aortocaval node visible on 40/7. Index 1.7 cm precaval node is visible lower in the pelvis on 50/7. This lymphadenopathy does generates some mass-effect on the IVC. No pelvic sidewall lymphadenopathy. Reproductive: Prostate gland is enlarged. Other: No intraperitoneal free fluid. Musculoskeletal: No worrisome lytic or sclerotic osseous abnormality. Degenerative disc disease noted L2-3 and L3-4. Bilateral pars interarticularis  defects noted at L5. IMPRESSION: 1. Interval development of a poorly defined 6.0 x 4.6 x 7.3 cm hypoenhancing mass in the upper and posterior interpolar region of the right kidney. This lesion has an almost infiltrative appearance in some areas with persistent central hypoenhancement on delayed imaging. Lesion extends into a posterior interpolar calyx. Abnormal soft tissue tracks around the right renal artery, but no evidence for filling defect in the right renal vein. Imaging features are compatible with neoplasm. Transitional cell carcinoma is somewhat favored given the hypoenhancement although clear cell renal cell carcinoma could also have this appearance. 2. Confluent lymphadenopathy in the right retroperitoneal space, consistent with metastatic disease. 3. New bilateral pulmonary nodules are too numerous to count, consistent with metastatic disease. 4. Prostatomegaly. 5. 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 6.  Aortic Atherosclerosis (ICD10-I70.0). These results will be called to the ordering clinician or representative by the Radiologist Assistant, and communication documented in the PACS or [employer]. Electronically Signed   By:  Misty Stanley M.D.   On: 07/31/2022 11:41    Labs:  CBC: Recent Labs    12/02/21 1332  WBC 9.1  HGB 16.7  HCT 46.9  PLT 359    COAGS: No results for input(s): "INR", "APTT" in the last 8760 hours.  BMP: Recent Labs    12/02/21 1332 07/31/22 1106  NA 142  --   K 4.2  --   CL 104  --   CO2 23  --   GLUCOSE 97  --   BUN 12  --   CALCIUM 10.3*  --   CREATININE 1.17 1.20    LIVER FUNCTION TESTS: Recent Labs    12/02/21 1332  BILITOT 0.7  AST 24  ALT 30  ALKPHOS 99  PROT 7.7  ALBUMIN 4.7    TUMOR MARKERS: No results for input(s): "AFPTM", "CEA", "CA199", "CHROMGRNA" in the last 8760 hours.  Assessment and Plan:  Burney Calzadilla is a 67 yo male with PMH significant for bladder cancer being seen today in relation to newly discovered right renal mass with right retroperitoneal lymphadenopathy. The patient was referred to IR for image-guided biopsy of the right retroperitoneal lymph nodes by Dr Grayland Ormond from Oncology. The case was reviewed and approved by Dr Pascal Lux and is set to tentatively proceed on 08/12/22 with Dr Annamaria Boots.  Risks and benefits of image-guided retroperitoneal lymph node biopsy was discussed with the patient and/or patient's family including, but not limited to bleeding, infection, damage to adjacent structures or low yield requiring additional tests.  All of the questions were answered and there is agreement to proceed.  Consent signed and in chart.   Thank you for this interesting consult.  I greatly enjoyed meeting Jeffrey Fernandez and look forward to participating in their care.  A copy of this report was sent to the requesting provider on this date.  Electronically Signed: Lura Em, PA-C 08/12/2022, 9:23 AM   I spent a total of  30 Minutes   in face to face in clinical consultation, greater than 50% of which was counseling/coordinating care for right retroperitoneal lymphadenopathy.

## 2022-08-14 ENCOUNTER — Other Ambulatory Visit: Payer: Self-pay | Admitting: Anatomic Pathology & Clinical Pathology

## 2022-08-14 ENCOUNTER — Ambulatory Visit
Admission: RE | Admit: 2022-08-14 | Discharge: 2022-08-14 | Disposition: A | Discharge status: Home / Self Care | Payer: BLUE CROSS/BLUE SHIELD | Source: Ambulatory Visit | Attending: Oncology | Admitting: Oncology

## 2022-08-14 DIAGNOSIS — N2889 Other specified disorders of kidney and ureter: Secondary | ICD-10-CM | POA: Insufficient documentation

## 2022-08-14 DIAGNOSIS — M4317 Spondylolisthesis, lumbosacral region: Secondary | ICD-10-CM | POA: Diagnosis not present

## 2022-08-14 DIAGNOSIS — R918 Other nonspecific abnormal finding of lung field: Secondary | ICD-10-CM | POA: Insufficient documentation

## 2022-08-14 DIAGNOSIS — I700 Atherosclerosis of aorta: Secondary | ICD-10-CM | POA: Insufficient documentation

## 2022-08-14 DIAGNOSIS — M48061 Spinal stenosis, lumbar region without neurogenic claudication: Secondary | ICD-10-CM | POA: Insufficient documentation

## 2022-08-14 DIAGNOSIS — Z8551 Personal history of malignant neoplasm of bladder: Secondary | ICD-10-CM | POA: Diagnosis present

## 2022-08-14 DIAGNOSIS — R590 Localized enlarged lymph nodes: Secondary | ICD-10-CM | POA: Diagnosis not present

## 2022-08-14 LAB — SURGICAL PATHOLOGY

## 2022-08-14 LAB — GLUCOSE, CAPILLARY: Glucose-Capillary: 96 mg/dL (ref 70–99)

## 2022-08-14 MED ORDER — FLUDEOXYGLUCOSE F - 18 (FDG) INJECTION
9.7200 | Freq: Once | INTRAVENOUS | Status: AC | PRN
  Administered 2022-08-14: 9.72 via INTRAVENOUS

## 2022-08-19 ENCOUNTER — Encounter: Payer: Self-pay | Admitting: Oncology

## 2022-08-19 ENCOUNTER — Inpatient Hospital Stay: Payer: BLUE CROSS/BLUE SHIELD | Attending: Oncology | Admitting: Oncology

## 2022-08-19 ENCOUNTER — Other Ambulatory Visit: Payer: Self-pay | Admitting: Oncology

## 2022-08-19 ENCOUNTER — Telehealth: Payer: Self-pay

## 2022-08-19 VITALS — BP 107/80 | HR 91 | Temp 97.0°F | Resp 18 | Wt 177.1 lb

## 2022-08-19 DIAGNOSIS — Z87891 Personal history of nicotine dependence: Secondary | ICD-10-CM | POA: Diagnosis not present

## 2022-08-19 DIAGNOSIS — R944 Abnormal results of kidney function studies: Secondary | ICD-10-CM | POA: Insufficient documentation

## 2022-08-19 DIAGNOSIS — C642 Malignant neoplasm of left kidney, except renal pelvis: Secondary | ICD-10-CM

## 2022-08-19 DIAGNOSIS — Z5111 Encounter for antineoplastic chemotherapy: Secondary | ICD-10-CM | POA: Insufficient documentation

## 2022-08-19 DIAGNOSIS — R82998 Other abnormal findings in urine: Secondary | ICD-10-CM | POA: Insufficient documentation

## 2022-08-19 DIAGNOSIS — I959 Hypotension, unspecified: Secondary | ICD-10-CM | POA: Insufficient documentation

## 2022-08-19 DIAGNOSIS — R197 Diarrhea, unspecified: Secondary | ICD-10-CM | POA: Diagnosis not present

## 2022-08-19 DIAGNOSIS — Z79899 Other long term (current) drug therapy: Secondary | ICD-10-CM | POA: Insufficient documentation

## 2022-08-19 DIAGNOSIS — C7802 Secondary malignant neoplasm of left lung: Secondary | ICD-10-CM | POA: Diagnosis not present

## 2022-08-19 DIAGNOSIS — C7801 Secondary malignant neoplasm of right lung: Secondary | ICD-10-CM | POA: Insufficient documentation

## 2022-08-19 DIAGNOSIS — C641 Malignant neoplasm of right kidney, except renal pelvis: Secondary | ICD-10-CM

## 2022-08-19 DIAGNOSIS — R112 Nausea with vomiting, unspecified: Secondary | ICD-10-CM | POA: Insufficient documentation

## 2022-08-19 DIAGNOSIS — C649 Malignant neoplasm of unspecified kidney, except renal pelvis: Secondary | ICD-10-CM | POA: Insufficient documentation

## 2022-08-19 MED ORDER — LIDOCAINE-PRILOCAINE 2.5-2.5 % EX CREA: TOPICAL_CREAM | CUTANEOUS | 3 refills | Status: DC

## 2022-08-19 MED ORDER — PROCHLORPERAZINE MALEATE 10 MG PO TABS: 10.0000 mg | ORAL_TABLET | Freq: Four times a day (QID) | ORAL | 2 refills | Status: DC | PRN

## 2022-08-19 MED ORDER — ONDANSETRON HCL 8 MG PO TABS: 8.0000 mg | ORAL_TABLET | Freq: Three times a day (TID) | ORAL | 2 refills | Status: DC | PRN

## 2022-08-19 MED ORDER — FENTANYL 25 MCG/HR TD PT72: 1.0000 | MEDICATED_PATCH | TRANSDERMAL | 0 refills | Status: DC

## 2022-08-19 NOTE — Progress Notes (Signed)
Patient here today for follow up regarding renal mass, history of bladder cancer. Patient reports pain 6-7/10 at its worst, hydrocodone not effective at this time. Patient continues to have pain in groin, abdomen and lower back. Patient also reports nausea that is new, decreased appetite.

## 2022-08-19 NOTE — Progress Notes (Signed)
START ON PATHWAY REGIMEN - Bladder     A cycle is every 21 days:     Gemcitabine      Cisplatin   **Always confirm dose/schedule in your pharmacy ordering system**  Patient Characteristics: Advanced/Metastatic Disease, First Line, No Prior Platinum-Based Therapy OR Prior Platinum-Based Therapy and Relapse > 12 Months, Good Renal Function (CrCl ? 60 mL/min) Therapeutic Status: Advanced/Metastatic Disease Line of Therapy: First Line Prior Therapy and Relapse Status: No Prior Platinum-Based Therapy Renal Function: Good Renal Function (CrCl ? 60 mL/min) Intent of Therapy: Non-Curative / Palliative Intent, Discussed with Patient

## 2022-08-19 NOTE — Progress Notes (Signed)
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  INTERVAL HISTORY: Patient returns to clinic today for further evaluation, discussion of his imaging and pathology results, and treatment planning.  He has noticed increased pain as well as increased nausea with decreased appetite.  He has no neurologic complaints.  He denies any recent fevers or illnesses.  He has no chest pain, shortness of breath, cough, or hemoptysis.  He denies any vomiting, constipation, or diarrhea.  He has no urinary complaints.  Patient offers no further specific complaints today.  REVIEW OF SYSTEMS:   Review of Systems  Constitutional: Negative.  Negative for fever, malaise/fatigue and weight loss.  Respiratory: Negative.  Negative for cough, hemoptysis and shortness of breath.   Cardiovascular: Negative.  Negative for chest pain and leg swelling.  Gastrointestinal:  Positive for nausea. Negative for abdominal pain.  Genitourinary:  Positive for flank pain. Negative for hematuria.  Musculoskeletal:  Negative for back pain.  Skin: Negative.  Negative for rash.  Neurological: Negative.  Negative for dizziness, focal weakness, weakness and headaches.  Psychiatric/Behavioral: Negative.  The patient is not nervous/anxious.     As per HPI. Otherwise, a complete review of systems is negative.  PAST MEDICAL HISTORY: Past Medical History:  Diagnosis Date   Cancer ([place]) 2013   bladder   Diverticulitis    History of chickenpox    History of measles    History of mumps    Hyperlipidemia    Hypertension     PAST SURGICAL HISTORY: Past Surgical History:  Procedure  Laterality Date   HERNIA REPAIR  2002,2003   SIGMOIDOSCOPY  1998   [facility]   TRANSURETHRAL RESECTION OF BLADDER TUMOR  05/11/2012   Dr. Sherral Hammers, [place]  2002   Bilateral inguinal herniorrhaphies    FAMILY HISTORY: Family History  Problem Relation Age of Onset   COPD Mother    Diverticulosis Mother    Diverticulosis Father    Diabetes Father    Hypertension Father    Diverticulosis Sister    Stroke Paternal Grandmother     ADVANCED DIRECTIVES (Y/N):  N  HEALTH MAINTENANCE: Social History   Tobacco Use   Smoking status: Former    Packs/day: 2.00    Years: 39.00    Total pack years: 78.00    Types: Cigarettes    Quit date: 07/14/2011    Years since quitting: 11.1    Passive exposure: Past   Smokeless tobacco: Never   Tobacco comments:    smoked 1 to 1 1/2  ppd for 35 years  Vaping Use   Vaping Use: Never used  Substance Use Topics   Alcohol use: Yes    Comment: drinks 2 beers daily: none since 1 week ago   Drug use: No     Colonoscopy:  PAP:  Bone density:  Lipid panel:  Allergies  Allergen Reactions   Amoxicillin Itching   Atenolol Diarrhea    Other reaction(s): HIVES   Other Nausea And Vomiting    Navy Beans   Oxycodone     Other reaction(s): HIVES   Shellfish Allergy Itching and Swelling    Current Outpatient Medications  Medication Sig  Dispense Refill   docusate sodium (COLACE) 250 MG capsule Take 250 mg by mouth daily.     fentaNYL (DURAGESIC) 25 MCG/HR Place 1 patch onto the skin every 3 (three) days. 10 patch 0   HYDROcodone-acetaminophen (NORCO) 5-325 MG tablet Take 1 tablet by mouth every 6 (six) hours as needed for moderate pain. 120 tablet 0   losartan-hydrochlorothiazide (HYZAAR) 100-12.5 MG tablet TAKE 1 TABLET BY MOUTH EVERY DAY 90 tablet 0   metoprolol succinate (TOPROL-XL) 25 MG 24 hr tablet TAKE 1 TABLET (25 MG TOTAL) BY MOUTH DAILY. 90 tablet 2   Multiple Vitamins-Minerals (OCUVITE ADULT FORMULA PO) Take 1  capsule by mouth.     multivitamin-lutein (OCUVITE-LUTEIN) CAPS capsule Take 1 capsule by mouth daily.     pantoprazole (PROTONIX) 40 MG tablet TAKE 1 TABLET BY MOUTH EVERY DAY 90 tablet 2   tamsulosin (FLOMAX) 0.4 MG CAPS capsule Take 1 capsule (0.4 mg total) by mouth daily. 30 capsule 11   aspirin 81 MG tablet Take 81 mg by mouth daily. (Patient not taking: Reported on 08/19/2022)     lidocaine-prilocaine (EMLA) cream Apply to affected area once 30 g 3   ondansetron (ZOFRAN-ODT) 8 MG disintegrating tablet Take 1 tablet (8 mg total) by mouth every 8 (eight) hours as needed for nausea or vomiting. 60 tablet 2   prochlorperazine (COMPAZINE) 10 MG tablet Take 1 tablet (10 mg total) by mouth every 6 (six) hours as needed (Nausea or vomiting). 60 tablet 2   sildenafil (REVATIO) 20 MG tablet Take 1-3 tablets (20-60 mg total) by mouth daily as needed. (Patient not taking: Reported on 08/12/2022) 30 tablet 11   Specialty Vitamins Products ([facility])  (Patient not taking: Reported on 08/12/2022)     No current facility-administered medications for this visit.    OBJECTIVE: Vitals:   08/19/22 1001  BP: 107/80  Pulse: 91  Resp: 18  Temp: (!) 97 F (36.1 C)     Body mass index is 26.15 kg/m.    ECOG FS:1 - Symptomatic but completely ambulatory  General: Well-developed, well-nourished, no acute distress. Eyes: Pink conjunctiva, anicteric sclera. HEENT: Normocephalic, moist mucous membranes. Lungs: No audible wheezing or coughing. Heart: Regular rate and rhythm. Abdomen: Soft, nontender, no obvious distention. Musculoskeletal: No edema, cyanosis, or clubbing. Neuro: Alert, answering all questions appropriately. Cranial nerves grossly intact. Skin: No rashes or petechiae noted. Psych: Normal affect.   LAB RESULTS:  Lab Results  Component Value Date   NA 134 (L) 08/12/2022   K 3.7 08/12/2022   CL 98 08/12/2022   CO2 27 08/12/2022   GLUCOSE 117 (H) 08/12/2022   BUN 14  08/12/2022   CREATININE 1.08 08/12/2022   CALCIUM 10.1 08/12/2022   PROT 7.7 12/02/2021   ALBUMIN 4.7 12/02/2021   AST 24 12/02/2021   ALT 30 12/02/2021   ALKPHOS 99 12/02/2021   BILITOT 0.7 12/02/2021   GFRNONAA >60 08/12/2022   GFRAA 90 06/10/2020    Lab Results  Component Value Date   WBC 13.7 (H) 08/12/2022   NEUTROABS 10.6 (H) 08/12/2022   HGB 15.2 08/12/2022   HCT 44.3 08/12/2022   MCV 91.9 08/12/2022   PLT 503 (H) 08/12/2022     STUDIES: NM PET Image Initial (PI) Skull Base To Thigh  Result Date: 08/14/2022 CLINICAL DATA:  Subsequent treatment strategy for metastatic bladder cancer. Retroperitoneal node biopsy 08/12/2022 (pending). EXAM: NUCLEAR MEDICINE PET SKULL BASE TO THIGH TECHNIQUE: 9.72 mCi F-18 FDG was injected intravenously.  Full-ring PET imaging was performed from the skull base to thigh after the radiotracer. CT data was obtained and used for attenuation correction and anatomic localization. Fasting blood glucose: 96 mg/dl COMPARISON:  Chest CT 08/05/2022.  Abdominopelvic CT 07/31/2022. FINDINGS: Mediastinal blood pool activity: SUV max 1.3 NECK: No hypermetabolic cervical lymph nodes are identified. No suspicious activity identified within the pharyngeal mucosal space. Incidental CT findings: none CHEST: There are no hypermetabolic mediastinal, hilar or axillary lymph nodes. Best shown on recent CT, there are innumerable pulmonary nodules bilaterally which demonstrate hypermetabolic activity, highly concerning for metastatic disease. The individual nodules are suboptimally assessed due to breathing artifact and misregistration with the PET data. The most intense metabolic activity is in the right perihilar region (SUV max 5.0). A 9 mm right lower lobe nodule on image 112/2 has an SUV max of 2.8. Additional nodules include a 9 mm left upper lobe nodule on image 104/2 and a left lower lobe nodule measuring 10 mm on image 116/2. Incidental CT findings: Mild aortic and  coronary artery atherosclerosis. ABDOMEN/PELVIS: There is no hypermetabolic activity within the liver, adrenal glands, spleen or pancreas. As shown on recent CT, there is a large centrally necrotic mass involving the posterior aspect of the right kidney, measuring up to 6.0 x 4.2 cm on image 168/2. This demonstrates peripheral hypermetabolic activity and as correlated with recent CT is consistent with renal cell carcinoma. The retroperitoneal adenopathy seen on CT is hypermetabolic. For example, lymph nodes along the right renal vessels measuring up to 4.0 x 2.2 cm on image 170/2 have an SUV max of 9.5. Aortocaval node measuring 3.1 x 1.9 cm on image 187/2 has an SUV max of 8.8, and there is a left periaortic node measuring 11 mm on image 167/2 which has an SUV max of 6.6. No hypermetabolic nodes are identified in the pelvis. No definite abnormality of the bladder or prostate gland. Incidental CT findings: Simple appearing cyst in the upper pole of the left kidney appears stable, without worrisome features. No follow-up imaging recommended. Aortic and branch vessel atherosclerosis. Mild diverticulosis of the descending and sigmoid colon. Prostatomegaly. SKELETON: There is no hypermetabolic activity to suggest osseous metastatic disease. Incidental CT findings: Mild spondylosis. 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 IMPRESSION: 1. Large centrally necrotic right renal mass with peripheral hypermetabolic activity consistent with renal cell carcinoma. 2. Hypermetabolic retroperitoneal adenopathy and innumerable hypermetabolic pulmonary nodules bilaterally consistent with metastatic disease, likely from renal cell carcinoma. 3. No pelvic adenopathy or definite bladder abnormality to suggest bladder cancer. No evidence of osseous metastatic disease. 4. 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 5.  Aortic  Atherosclerosis (ICD10-I70.0). Electronically Signed   By: Richardean Sale M.D.   On: 08/14/2022 14:47   CT ABDOMINAL MASS BIOPSY  Result Date: 08/12/2022 INDICATION: Metastatic retroperitoneal adenopathy, right renal cell carcinoma by imaging EXAM: CT-GUIDED BIOPSY METASTATIC RIGHT RETROPERITONEAL ADENOPATHY MEDICATIONS: 1% LIDOCAINE LOCAL ANESTHESIA/SEDATION: 1.0 mg IV Versed; 50 mcg IV Fentanyl Moderate Sedation Time:  10 MINUTE The patient was continuously monitored during the procedure by the interventional radiology nurse under my direct supervision. PROCEDURE: The procedure, risks, benefits, and alternatives were explained to the patient. Questions regarding the procedure were encouraged and answered. The patient understands and consents to the procedure. Previous imaging reviewed. Patient positioned prone. Noncontrast localization CT performed. The right retroperitoneal adenopathy was localized and marked for a right posterior paraspinous approach. Under  sterile conditions and local anesthesia, the 17 gauge 11.8 cm guide was advanced from a right posterior paraspinous approach to the adenopathy. Needle position confirmed with CT. 18 gauge core biopsies obtained. These were placed in formalin. Samples were intact and non fragmented. Needle removed. Postprocedure imaging demonstrates no hemorrhage or hematoma. Patient tolerated the procedure well without complication. Vital sign monitoring by nursing staff during the procedure will continue as patient is in the special procedures unit for post procedure observation. FINDINGS: The images document guide needle placement within the metastatic retroperitoneal adenopathy. Post biopsy images demonstrate no hemorrhage or hematoma. COMPLICATIONS: None immediate. IMPRESSION: Successful CT-guided core biopsy of metastatic retroperitoneal adenopathy. Marland Kitchen RADIATION DOSE REDUCTION: This exam was performed according to the departmental dose-optimization program which  includes automated exposure control, adjustment of the mA and/or kV according to patient size and/or use of iterative reconstruction technique. Electronically Signed   By: Jerilynn Mages.  Shick M.D.   On: 08/12/2022 11:57   CT CHEST LUNG CA SCREEN LOW DOSE W/O CM  Result Date: 08/06/2022 CLINICAL DATA:  Former smoker with 70 pack-year history EXAM: CT CHEST WITHOUT CONTRAST LOW-DOSE FOR LUNG CANCER SCREENING TECHNIQUE: Multidetector CT imaging of the chest was performed following the standard protocol without IV contrast. RADIATION DOSE REDUCTION: This exam was performed according to the departmental dose-optimization program which includes automated exposure control, adjustment of the mA and/or kV according to patient size and/or use of iterative reconstruction technique. COMPARISON:  Lung cancer screening CT dated August 05, 2021; CT of the abdomen and pelvis dated July 31, 2022 FINDINGS: Cardiovascular: Normal heart size. No pericardial effusion. Normal caliber thoracic aorta with mild calcified plaque. Mediastinum/Nodes: Esophagus and thyroid are unremarkable. No pathologically enlarged lymph nodes seen in the chest. Lungs/Pleura: Central airways are patent. No consolidation, pleural effusion or pneumothorax. Numerous bilateral solid pulmonary nodules nodules which on recent prior CT of the abdomen and pelvis are unchanged when compared with most recent prior lung cancer screening CT nodules are new. Reference nodule of the right lower lobe measuring 12 mm in mean diameter on image 166. Reference nodule of the left lower lobe measuring 11.4 mm in mean diameter on image 179. Upper Abdomen: No acute abnormality. Musculoskeletal: No chest wall mass or suspicious bone lesions identified. IMPRESSION: 1. Numerous solid pulmonary nodules, highly concerning for metastatic disease given presence of renal mass on recent CT of the abdomen and pelvis. Lung-RADS 4X, highly suspicious. Additional imaging evaluation or  consultation with Pulmonology or Thoracic Surgery recommended. 2. Aortic Atherosclerosis (ICD10-I70.0) and Emphysema (ICD10-J43.9). Electronically Signed   By: Yetta Glassman M.D.   On: 08/06/2022 09:13  CT HEMATURIA WORKUP  Result Date: 07/31/2022 CLINICAL DATA:  Abdominal pain with gross hematuria. History of bladder cancer. * Tracking Code: BO * EXAM: CT ABDOMEN AND PELVIS WITHOUT AND WITH CONTRAST TECHNIQUE: Multidetector CT imaging of the abdomen and pelvis was performed following the standard protocol before and following the bolus administration of intravenous contrast. RADIATION DOSE REDUCTION: This exam was performed according to the departmental dose-optimization program which includes automated exposure control, adjustment of the mA and/or kV according to patient size and/or use of iterative reconstruction technique. CONTRAST:  141m OMNIPAQUE IOHEXOL 300 MG/ML  SOLN COMPARISON:  06/19/2020 FINDINGS: Lower chest: New bilateral pulmonary nodules are too numerous to count. Index left upper lobe nodule measures 9 mm on image 6/17. Index right lower lobe nodule measures 9 mm on 19/17. Index left lower lobe nodule measures 7 mm on 13/17. No pleural effusion. Hepatobiliary:  No suspicious focal abnormality within the liver parenchyma. There is no evidence for gallstones, gallbladder wall thickening, or pericholecystic fluid. No intrahepatic or extrahepatic biliary dilation. Pancreas: No focal mass lesion. No dilatation of the main duct. No intraparenchymal cyst. No peripancreatic edema. Spleen: No splenomegaly. No focal mass lesion. Adrenals/Urinary Tract: No adrenal nodule or mass. Since 06/19/2020, the patient has developed a poorly defined 6.0 x 4.6 x 7.3 cm hypoenhancing mass in the upper and posterior interpolar region of the right kidney. This lesion has an almost infiltrative appearance in some regions with persistent central hypoenhancement on delayed imaging. Involvement of a posterior calyx is  best appreciated on axial 41/7. Abnormal soft tissue tracks around the right renal artery, but no evidence for filling defect in the right renal vein. 2.5 cm exophytic simple cyst noted upper pole left kidney. No hydroureter.  No focal bladder wall abnormality. Stomach/Bowel: Stomach is unremarkable. No gastric wall thickening. No evidence of outlet obstruction. Duodenum is normally positioned as is the ligament of Treitz. No small bowel wall thickening. No small bowel dilatation. The terminal ileum is normal. The appendix is normal. No gross colonic mass. No colonic wall thickening. Vascular/Lymphatic: There is mild atherosclerotic calcification of the abdominal aorta without aneurysm. Confluent lymphadenopathy is identified in the right retroperitoneal space with 2.3 cm short axis retrocaval node visible on 37/7 and a 1.6 cm short axis aortocaval node visible on 40/7. Index 1.7 cm precaval node is visible lower in the pelvis on 50/7. This lymphadenopathy does generates some mass-effect on the IVC. No pelvic sidewall lymphadenopathy. Reproductive: Prostate gland is enlarged. Other: No intraperitoneal free fluid. Musculoskeletal: No worrisome lytic or sclerotic osseous abnormality. Degenerative disc disease noted L2-3 and L3-4. Bilateral pars interarticularis defects noted at L5. IMPRESSION: 1. Interval development of a poorly defined 6.0 x 4.6 x 7.3 cm hypoenhancing mass in the upper and posterior interpolar region of the right kidney. This lesion has an almost infiltrative appearance in some areas with persistent central hypoenhancement on delayed imaging. Lesion extends into a posterior interpolar calyx. Abnormal soft tissue tracks around the right renal artery, but no evidence for filling defect in the right renal vein. Imaging features are compatible with neoplasm. Transitional cell carcinoma is somewhat favored given the hypoenhancement although clear cell renal cell carcinoma could also have this appearance.  2. Confluent lymphadenopathy in the right retroperitoneal space, consistent with metastatic disease. 3. New bilateral pulmonary nodules are too numerous to count, consistent with metastatic disease. 4. Prostatomegaly. 5. 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 6.  Aortic Atherosclerosis (ICD10-I70.0). These results will be called to the ordering clinician or representative by the Radiologist Assistant, and communication documented in the PACS or [employer]. Electronically Signed   By: Misty Stanley M.D.   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  This lesion was not evident on CT scan from June 19, 2020.  PET scan results from August 14, 2022 reviewed independently and reported as above with hypermetabolic kidney mass, retroperitoneal lymphadenopathy and multiple bilateral pulmonary nodules consistent with metastatic disease.  Biopsy confirmed diagnosis.  Patient will benefit from systemic chemotherapy using cisplatin and gemcitabine on day 1 with gemcitabine only  on day 8.  This will be a 21-day cycle.  Patient will require port placement.  Return to clinic in 1 week for further evaluation and consideration of cycle 1, day 1.   Pain: Patient was given a prescription for fentanyl patch today.  Continue Norco as needed.   Nausea: Patient was given a prescription for antiemetics today.  I spent a total of 30 minutes reviewing chart data, face-to-face evaluation with the patient, counseling and coordination of care as detailed above.    Patient expressed understanding and was in agreement with this plan. He also understands that He can call clinic at any time  with any questions, concerns, or complaints.    Cancer Staging  Urothelial carcinoma of kidney [facility]) Staging form: Kidney, AJCC 8th Edition - Clinical stage from 08/19/2022: Stage IV (cT4, cN1, cM1) - Signed by Lloyd Huger, MD on 08/19/2022   Lloyd Huger, MD   08/19/2022 1:59 PM

## 2022-08-19 NOTE — Telephone Encounter (Signed)
Port placement is scheduled for 2/13 arrive a 7:30a for a 8:30a procedure. Patient expressed understanding of the appointment date and time.

## 2022-08-20 ENCOUNTER — Telehealth: Payer: Self-pay | Admitting: *Deleted

## 2022-08-20 ENCOUNTER — Other Ambulatory Visit: Payer: BLUE CROSS/BLUE SHIELD

## 2022-08-20 ENCOUNTER — Other Ambulatory Visit: Payer: Self-pay

## 2022-08-20 NOTE — Telephone Encounter (Signed)
FMLA forms received for patient, wife and 2 children. Forms completed and sent for physician signature

## 2022-08-21 ENCOUNTER — Inpatient Hospital Stay: Payer: BLUE CROSS/BLUE SHIELD

## 2022-08-21 ENCOUNTER — Other Ambulatory Visit: Payer: Self-pay | Admitting: Oncology

## 2022-08-21 DIAGNOSIS — C641 Malignant neoplasm of right kidney, except renal pelvis: Secondary | ICD-10-CM

## 2022-08-21 NOTE — H&P (Signed)
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 male with PMH signficant for bladder cancer, HLD, and HTN. Pt is well known to IR service having undergone biopsy of right renal mass that resulted as metastatic carcinoma. He is followed by oncology and has once again been referred to IR for tunneled catheter with port placement for chemotherapy by Dr. Grayland Ormond.   Pt denies chills, fever, CP, SOB or weakness. Pt endorses abd pain, loss of appetite, constipation, nausea and weight loss.   Pt is NPO per order.   Past Medical History:  Diagnosis Date   Cancer [facility]) 2013   bladder   Diverticulitis    History of chickenpox    History of measles    History of mumps    Hyperlipidemia    Hypertension     Past Surgical History:  Procedure Laterality Date   HERNIA REPAIR  2002,2003   SIGMOIDOSCOPY  1998   [place] [facility]   TRANSURETHRAL RESECTION OF BLADDER TUMOR  05/11/2012   Dr. Sherral Hammers, [facility]   VASECTOMY  2002   Bilateral inguinal herniorrhaphies    Allergies: Amoxicillin, Atenolol, Other, Oxycodone, and Shellfish allergy  Medications: Prior to Admission medications   Medication Sig Start Date End Date Taking? Authorizing Provider  aspirin 81 MG tablet Take 81 mg by mouth daily. Patient not taking: Reported on 08/19/2022    [provider]  docusate sodium (COLACE) 250 MG capsule Take 250 mg by mouth daily.    [provider]  fentaNYL (DURAGESIC) 25 MCG/HR Place 1 patch onto the skin every 3 (three) days. 08/19/22   Lloyd Huger, MD  HYDROcodone-acetaminophen (NORCO) 5-325 MG tablet Take 1 tablet by mouth every 6 (six) hours as needed for moderate pain. 08/05/22    Lloyd Huger, MD  lidocaine-prilocaine (EMLA) cream Apply to affected area once 08/19/22   Lloyd Huger, MD  losartan-hydrochlorothiazide [facility]) 100-12.5 MG tablet TAKE 1 TABLET BY MOUTH EVERY DAY 07/29/22   Birdie Sons, MD  metoprolol succinate (TOPROL-XL) 25 MG 24 hr tablet TAKE 1 TABLET (25 MG TOTAL) BY MOUTH DAILY. 01/29/22   Birdie Sons, MD  Multiple Vitamins-Minerals (OCUVITE ADULT FORMULA PO) Take 1 capsule by mouth.    [provider]  multivitamin-lutein (OCUVITE-LUTEIN) CAPS capsule Take 1 capsule by mouth daily.    [provider]  ondansetron (ZOFRAN) 8 MG tablet Take 1 tablet (8 mg total) by mouth every 8 (eight) hours as needed for nausea or vomiting. 08/21/22   Borders, Kirt Boys, NP  pantoprazole (PROTONIX) 40 MG tablet TAKE 1 TABLET BY MOUTH EVERY DAY 01/29/22   Birdie Sons, MD  prochlorperazine (COMPAZINE) 10 MG tablet Take 1 tablet (10 mg total) by mouth every 6 (six) hours as needed (Nausea or vomiting). 08/19/22   Lloyd Huger, MD  sildenafil (REVATIO) 20 MG tablet Take 1-3 tablets (20-60 mg total) by mouth daily as needed. Patient not taking: Reported on 08/12/2022 12/24/21   Billey Co, MD  Specialty Vitamins Products ([place])  11/28/21   [provider]  tamsulosin (FLOMAX) 0.4 MG CAPS capsule Take 1 capsule (0.4 mg total) by mouth daily. 07/28/22   Billey Co, MD  Family History  Problem Relation Age of Onset   COPD Mother    Diverticulosis Mother    Diverticulosis Father    Diabetes Father    Hypertension Father    Diverticulosis Sister    Stroke Paternal Grandmother     Social History   Socioeconomic History   Marital status: Married    Spouse name: Anderson Malta   Number of children: 3   Years of education: Not on file   Highest education level: Not on file  Occupational History   Occupation: Truck Geophysicist/field seismologist  Tobacco Use   Smoking status: Former    Packs/day: 2.00     Years: 39.00    Total pack years: 78.00    Types: Cigarettes    Quit date: 07/14/2011    Years since quitting: 11.1    Passive exposure: Past   Smokeless tobacco: Never   Tobacco comments:    smoked 1 to 1 1/2  ppd for 35 years  Vaping Use   Vaping Use: Never used  Substance and Sexual Activity   Alcohol use: Yes    Comment: drinks 2 beers daily: none since 1 week ago   Drug use: No   Sexual activity: Not on file  Other Topics Concern   Not on file  Social History Narrative   Lives at home with wife , daughter , 3 grand daughters, son & his 2 kids    Social Determinants of Health   Financial Resource Strain: Not on file  Food Insecurity: Not on file  Transportation Needs: Not on file  Physical Activity: Not on file  Stress: Not on file  Social Connections: Not on file     Review of Systems: A 12 point ROS discussed and pertinent positives are indicated in the HPI above.  All other systems are negative.  Review of Systems  Constitutional:  Positive for appetite change, fatigue and unexpected weight change. Negative for chills and fever.  Respiratory:  Negative for shortness of breath.   Cardiovascular:  Negative for chest pain.  Gastrointestinal:  Positive for abdominal pain, constipation and nausea. Negative for vomiting.  Neurological:  Positive for dizziness and light-headedness. Negative for weakness.  Hematological:  Does not bruise/bleed easily.    Vital Signs: BP 118/86   Pulse 78   Temp (!) 97.5 F (36.4 C) (Oral)   Resp 13   Ht 5' 9"$  (1.753 m)   Wt 176 lb 12.9 oz (80.2 kg)   SpO2 94%   BMI 26.11 kg/m     Physical Exam Vitals reviewed.  Constitutional:      General: He is not in acute distress.    Appearance: Normal appearance. He is not ill-appearing.  HENT:     Head: Normocephalic and atraumatic.  Cardiovascular:     Rate and Rhythm: Normal rate and regular rhythm.     Pulses: Normal pulses.     Heart sounds: Normal heart sounds. No murmur  heard. Pulmonary:     Effort: Pulmonary effort is normal. No respiratory distress.     Breath sounds: Normal breath sounds.  Abdominal:     General: Bowel sounds are normal. There is no distension.     Palpations: Abdomen is soft.     Tenderness: There is no abdominal tenderness. There is no guarding.  Musculoskeletal:     Right lower leg: No edema.     Left lower leg: No edema.  Skin:    General: Skin is warm and dry.  Neurological:  Mental Status: He is alert and oriented to person, place, and time.  Psychiatric:        Mood and Affect: Mood normal.        Behavior: Behavior normal.        Thought Content: Thought content normal.        Judgment: Judgment normal.     Imaging: NM PET Image Initial (PI) Skull Base To Thigh  Result Date: 08/14/2022 CLINICAL DATA:  Subsequent treatment strategy for metastatic bladder cancer. Retroperitoneal node biopsy 08/12/2022 (pending). EXAM: NUCLEAR MEDICINE PET SKULL BASE TO THIGH TECHNIQUE: 9.72 mCi F-18 FDG was injected intravenously. Full-ring PET imaging was performed from the skull base to thigh after the radiotracer. CT data was obtained and used for attenuation correction and anatomic localization. Fasting blood glucose: 96 mg/dl COMPARISON:  Chest CT 08/05/2022.  Abdominopelvic CT 07/31/2022. FINDINGS: Mediastinal blood pool activity: SUV max 1.3 NECK: No hypermetabolic cervical lymph nodes are identified. No suspicious activity identified within the pharyngeal mucosal space. Incidental CT findings: none CHEST: There are no hypermetabolic mediastinal, hilar or axillary lymph nodes. Best shown on recent CT, there are innumerable pulmonary nodules bilaterally which demonstrate hypermetabolic activity, highly concerning for metastatic disease. The individual nodules are suboptimally assessed due to breathing artifact and misregistration with the PET data. The most intense metabolic activity is in the right perihilar region (SUV max 5.0). A 9 mm  right lower lobe nodule on image 112/2 has an SUV max of 2.8. Additional nodules include a 9 mm left upper lobe nodule on image 104/2 and a left lower lobe nodule measuring 10 mm on image 116/2. Incidental CT findings: Mild aortic and coronary artery atherosclerosis. ABDOMEN/PELVIS: There is no hypermetabolic activity within the liver, adrenal glands, spleen or pancreas. As shown on recent CT, there is a large centrally necrotic mass involving the posterior aspect of the right kidney, measuring up to 6.0 x 4.2 cm on image 168/2. This demonstrates peripheral hypermetabolic activity and as correlated with recent CT is consistent with renal cell carcinoma. The retroperitoneal adenopathy seen on CT is hypermetabolic. For example, lymph nodes along the right renal vessels measuring up to 4.0 x 2.2 cm on image 170/2 have an SUV max of 9.5. Aortocaval node measuring 3.1 x 1.9 cm on image 187/2 has an SUV max of 8.8, and there is a left periaortic node measuring 11 mm on image 167/2 which has an SUV max of 6.6. No hypermetabolic nodes are identified in the pelvis. No definite abnormality of the bladder or prostate gland. Incidental CT findings: Simple appearing cyst in the upper pole of the left kidney appears stable, without worrisome features. No follow-up imaging recommended. Aortic and branch vessel atherosclerosis. Mild diverticulosis of the descending and sigmoid colon. Prostatomegaly. SKELETON: There is no hypermetabolic activity to suggest osseous metastatic disease. Incidental CT findings: Mild spondylosis. 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 IMPRESSION: 1. Large centrally necrotic right renal mass with peripheral hypermetabolic activity consistent with renal cell carcinoma. 2. Hypermetabolic retroperitoneal adenopathy and innumerable hypermetabolic pulmonary nodules bilaterally consistent with metastatic disease, likely from renal cell carcinoma. 3. No  pelvic adenopathy or definite bladder abnormality to suggest bladder cancer. No evidence of osseous metastatic disease. 4. 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 5.  Aortic Atherosclerosis (ICD10-I70.0). Electronically Signed   By: Richardean Sale M.D.   On: 08/14/2022 14:47   CT ABDOMINAL MASS BIOPSY  Result Date:  08/12/2022 INDICATION: Metastatic retroperitoneal adenopathy, right renal cell carcinoma by imaging EXAM: CT-GUIDED BIOPSY METASTATIC RIGHT RETROPERITONEAL ADENOPATHY MEDICATIONS: 1% LIDOCAINE LOCAL ANESTHESIA/SEDATION: 1.0 mg IV Versed; 50 mcg IV Fentanyl Moderate Sedation Time:  10 MINUTE The patient was continuously monitored during the procedure by the interventional radiology nurse under my direct supervision. PROCEDURE: The procedure, risks, benefits, and alternatives were explained to the patient. Questions regarding the procedure were encouraged and answered. The patient understands and consents to the procedure. Previous imaging reviewed. Patient positioned prone. Noncontrast localization CT performed. The right retroperitoneal adenopathy was localized and marked for a right posterior paraspinous approach. Under sterile conditions and local anesthesia, the 17 gauge 11.8 cm guide was advanced from a right posterior paraspinous approach to the adenopathy. Needle position confirmed with CT. 18 gauge core biopsies obtained. These were placed in formalin. Samples were intact and non fragmented. Needle removed. Postprocedure imaging demonstrates no hemorrhage or hematoma. Patient tolerated the procedure well without complication. Vital sign monitoring by nursing staff during the procedure will continue as patient is in the special procedures unit for post procedure observation. FINDINGS: The images document guide needle placement within the metastatic retroperitoneal adenopathy. Post biopsy images demonstrate no hemorrhage or hematoma.  COMPLICATIONS: None immediate. IMPRESSION: Successful CT-guided core biopsy of metastatic retroperitoneal adenopathy. Marland Kitchen RADIATION DOSE REDUCTION: This exam was performed according to the departmental dose-optimization program which includes automated exposure control, adjustment of the mA and/or kV according to patient size and/or use of iterative reconstruction technique. Electronically Signed   By: Jerilynn Mages.  Shick M.D.   On: 08/12/2022 11:57   CT CHEST LUNG CA SCREEN LOW DOSE W/O CM  Result Date: 08/06/2022 CLINICAL DATA:  Former smoker with 70 pack-year history EXAM: CT CHEST WITHOUT CONTRAST LOW-DOSE FOR LUNG CANCER SCREENING TECHNIQUE: Multidetector CT imaging of the chest was performed following the standard protocol without IV contrast. RADIATION DOSE REDUCTION: This exam was performed according to the departmental dose-optimization program which includes automated exposure control, adjustment of the mA and/or kV according to patient size and/or use of iterative reconstruction technique. COMPARISON:  Lung cancer screening CT dated August 05, 2021; CT of the abdomen and pelvis dated July 31, 2022 FINDINGS: Cardiovascular: Normal heart size. No pericardial effusion. Normal caliber thoracic aorta with mild calcified plaque. Mediastinum/Nodes: Esophagus and thyroid are unremarkable. No pathologically enlarged lymph nodes seen in the chest. Lungs/Pleura: Central airways are patent. No consolidation, pleural effusion or pneumothorax. Numerous bilateral solid pulmonary nodules nodules which on recent prior CT of the abdomen and pelvis are unchanged when compared with most recent prior lung cancer screening CT nodules are new. Reference nodule of the right lower lobe measuring 12 mm in mean diameter on image 166. Reference nodule of the left lower lobe measuring 11.4 mm in mean diameter on image 179. Upper Abdomen: No acute abnormality. Musculoskeletal: No chest wall mass or suspicious bone lesions identified.  IMPRESSION: 1. Numerous solid pulmonary nodules, highly concerning for metastatic disease given presence of renal mass on recent CT of the abdomen and pelvis. Lung-RADS 4X, highly suspicious. Additional imaging evaluation or consultation with Pulmonology or Thoracic Surgery recommended. 2. Aortic Atherosclerosis (ICD10-I70.0) and Emphysema (ICD10-J43.9). Electronically Signed   By: Yetta Glassman M.D.   On: 08/06/2022 09:13  CT HEMATURIA WORKUP  Result Date: 07/31/2022 CLINICAL DATA:  Abdominal pain with gross hematuria. History of bladder cancer. * Tracking Code: BO * EXAM: CT ABDOMEN AND PELVIS WITHOUT AND WITH CONTRAST TECHNIQUE: Multidetector CT imaging of the abdomen and pelvis was  performed following the standard protocol before and following the bolus administration of intravenous contrast. RADIATION DOSE REDUCTION: This exam was performed according to the departmental dose-optimization program which includes automated exposure control, adjustment of the mA and/or kV according to patient size and/or use of iterative reconstruction technique. CONTRAST:  156m OMNIPAQUE IOHEXOL 300 MG/ML  SOLN COMPARISON:  06/19/2020 FINDINGS: Lower chest: New bilateral pulmonary nodules are too numerous to count. Index left upper lobe nodule measures 9 mm on image 6/17. Index right lower lobe nodule measures 9 mm on 19/17. Index left lower lobe nodule measures 7 mm on 13/17. No pleural effusion. Hepatobiliary: No suspicious focal abnormality within the liver parenchyma. There is no evidence for gallstones, gallbladder wall thickening, or pericholecystic fluid. No intrahepatic or extrahepatic biliary dilation. Pancreas: No focal mass lesion. No dilatation of the main duct. No intraparenchymal cyst. No peripancreatic edema. Spleen: No splenomegaly. No focal mass lesion. Adrenals/Urinary Tract: No adrenal nodule or mass. Since 06/19/2020, the patient has developed a poorly defined 6.0 x 4.6 x 7.3 cm hypoenhancing mass in  the upper and posterior interpolar region of the right kidney. This lesion has an almost infiltrative appearance in some regions with persistent central hypoenhancement on delayed imaging. Involvement of a posterior calyx is best appreciated on axial 41/7. Abnormal soft tissue tracks around the right renal artery, but no evidence for filling defect in the right renal vein. 2.5 cm exophytic simple cyst noted upper pole left kidney. No hydroureter.  No focal bladder wall abnormality. Stomach/Bowel: Stomach is unremarkable. No gastric wall thickening. No evidence of outlet obstruction. Duodenum is normally positioned as is the ligament of Treitz. No small bowel wall thickening. No small bowel dilatation. The terminal ileum is normal. The appendix is normal. No gross colonic mass. No colonic wall thickening. Vascular/Lymphatic: There is mild atherosclerotic calcification of the abdominal aorta without aneurysm. Confluent lymphadenopathy is identified in the right retroperitoneal space with 2.3 cm short axis retrocaval node visible on 37/7 and a 1.6 cm short axis aortocaval node visible on 40/7. Index 1.7 cm precaval node is visible lower in the pelvis on 50/7. This lymphadenopathy does generates some mass-effect on the IVC. No pelvic sidewall lymphadenopathy. Reproductive: Prostate gland is enlarged. Other: No intraperitoneal free fluid. Musculoskeletal: No worrisome lytic or sclerotic osseous abnormality. Degenerative disc disease noted L2-3 and L3-4. Bilateral pars interarticularis defects noted at L5. IMPRESSION: 1. Interval development of a poorly defined 6.0 x 4.6 x 7.3 cm hypoenhancing mass in the upper and posterior interpolar region of the right kidney. This lesion has an almost infiltrative appearance in some areas with persistent central hypoenhancement on delayed imaging. Lesion extends into a posterior interpolar calyx. Abnormal soft tissue tracks around the right renal artery, but no evidence for filling  defect in the right renal vein. Imaging features are compatible with neoplasm. Transitional cell carcinoma is somewhat favored given the hypoenhancement although clear cell renal cell carcinoma could also have this appearance. 2. Confluent lymphadenopathy in the right retroperitoneal space, consistent with metastatic disease. 3. New bilateral pulmonary nodules are too numerous to count, consistent with metastatic disease. 4. Prostatomegaly. 5. 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 6.  Aortic Atherosclerosis (ICD10-I70.0). These results will be called to the ordering clinician or representative by the Radiologist Assistant, and communication documented in the PACS or [employer] Electronically Signed   By: EMisty StanleyM.D.   On: 07/31/2022 11:41    Labs:  CBC: Recent Labs  12/02/21 1332 08/12/22 1017  WBC 9.1 13.7*  HGB 16.7 15.2  HCT 46.9 44.3  PLT 359 503*    COAGS: Recent Labs    08/12/22 1017  INR 1.1    BMP: Recent Labs    12/02/21 1332 07/31/22 1106 08/12/22 1017  NA 142  --  134*  K 4.2  --  3.7  CL 104  --  98  CO2 23  --  27  GLUCOSE 97  --  117*  BUN 12  --  14  CALCIUM 10.3*  --  10.1  CREATININE 1.17 1.20 1.08  GFRNONAA  --   --  >60    LIVER FUNCTION TESTS: Recent Labs    12/02/21 1332  BILITOT 0.7  AST 24  ALT 30  ALKPHOS 99  PROT 7.7  ALBUMIN 4.7    TUMOR MARKERS: No results for input(s): "AFPTM", "CEA", "CA199", "CHROMGRNA" in the last 8760 hours.  Assessment and Plan:  67 yo male with newly diagnosed urothelial carcinoma of right kidney presents to IR for tunneled catheter with port placement.   Pt resting in bed with wife at bedside.  He is A&O, calm and pleasant.  He is in no distress.   Risks and benefits of image guided tunneled catheter with port placement with moderate sedation was discussed with the patient including, but not limited to bleeding, infection,  pneumothorax, or fibrin sheath development and need for additional procedures.  All of the patient's questions were answered, patient is agreeable to proceed. Consent signed and in chart.  Thank you for this interesting consult.  I greatly enjoyed meeting GENOVEVO GURUNG and look forward to participating in their care.  A copy of this report was sent to the requesting provider on this date.  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

## 2022-08-21 NOTE — Telephone Encounter (Signed)
Form signed and copied for chart, put in envelop at front desk for patient to pick up.   Patient dropped off another form for disabilty today

## 2022-08-23 ENCOUNTER — Encounter: Payer: Self-pay | Admitting: Internal Medicine

## 2022-08-23 NOTE — Progress Notes (Signed)
Wife called re: constipation.  Recommend taking MiraLAX twice a day try glycerin suppository.  If it does not work recommend trying Dulcolax suppository Senokot.  Call us in the morning if no relief noted.  GB

## 2022-08-24 ENCOUNTER — Other Ambulatory Visit: Payer: Self-pay | Admitting: Student

## 2022-08-24 ENCOUNTER — Encounter: Payer: Self-pay | Admitting: Oncology

## 2022-08-24 DIAGNOSIS — C641 Malignant neoplasm of right kidney, except renal pelvis: Secondary | ICD-10-CM

## 2022-08-24 NOTE — Progress Notes (Signed)
Patient for IR Port Placement on Tues 08/25/2022, I called and spoke with the patient on the phone and gave pre-procedure instructions. Pt was made aware to be here at 7:30a, NPO after MN prior to procedure as well as driver post procedure/recovery/discharge. Pt stated understanding.  Called 08/24/2022

## 2022-08-25 ENCOUNTER — Ambulatory Visit
Admission: RE | Admit: 2022-08-25 | Discharge: 2022-08-25 | Disposition: A | Discharge status: Home / Self Care | Payer: BLUE CROSS/BLUE SHIELD | Source: Ambulatory Visit | Attending: Oncology | Admitting: Oncology

## 2022-08-25 ENCOUNTER — Other Ambulatory Visit: Payer: Self-pay | Admitting: *Deleted

## 2022-08-25 DIAGNOSIS — Z8551 Personal history of malignant neoplasm of bladder: Secondary | ICD-10-CM | POA: Diagnosis not present

## 2022-08-25 DIAGNOSIS — C642 Malignant neoplasm of left kidney, except renal pelvis: Secondary | ICD-10-CM

## 2022-08-25 DIAGNOSIS — E785 Hyperlipidemia, unspecified: Secondary | ICD-10-CM | POA: Diagnosis not present

## 2022-08-25 DIAGNOSIS — C641 Malignant neoplasm of right kidney, except renal pelvis: Secondary | ICD-10-CM | POA: Insufficient documentation

## 2022-08-25 DIAGNOSIS — I10 Essential (primary) hypertension: Secondary | ICD-10-CM | POA: Diagnosis not present

## 2022-08-25 DIAGNOSIS — Z87891 Personal history of nicotine dependence: Secondary | ICD-10-CM | POA: Diagnosis not present

## 2022-08-25 HISTORY — PX: IR IMAGING GUIDED PORT INSERTION: IMG5740

## 2022-08-25 MED ORDER — MIDAZOLAM HCL 5 MG/5ML IJ SOLN
INTRAMUSCULAR | Status: AC | PRN
  Administered 2022-08-25: .5 mg via INTRAVENOUS

## 2022-08-25 MED ORDER — LIDOCAINE-EPINEPHRINE 1 %-1:100000 IJ SOLN
INTRAMUSCULAR | Status: AC
  Administered 2022-08-25: 15 mL
  Filled 2022-08-25: qty 1

## 2022-08-25 MED ORDER — FENTANYL CITRATE (PF) 100 MCG/2ML IJ SOLN
INTRAMUSCULAR | Status: AC | PRN
  Administered 2022-08-25: 25 ug via INTRAVENOUS
  Administered 2022-08-25: 50 ug via INTRAVENOUS
  Administered 2022-08-25: 25 ug via INTRAVENOUS

## 2022-08-25 MED ORDER — MIDAZOLAM HCL 2 MG/2ML IJ SOLN
INTRAMUSCULAR | Status: AC | PRN
  Administered 2022-08-25: 1 mg via INTRAVENOUS
  Administered 2022-08-25: .5 mg via INTRAVENOUS

## 2022-08-25 MED ORDER — HEPARIN SOD (PORK) LOCK FLUSH 100 UNIT/ML IV SOLN
INTRAVENOUS | Status: AC
  Administered 2022-08-25: 500 [IU]
  Filled 2022-08-25: qty 5

## 2022-08-25 MED ORDER — SODIUM CHLORIDE 0.9 % IV SOLN: INTRAVENOUS | Status: DC

## 2022-08-25 MED ORDER — FENTANYL CITRATE (PF) 100 MCG/2ML IJ SOLN
INTRAMUSCULAR | Status: AC
  Filled 2022-08-25: qty 2

## 2022-08-25 MED ORDER — MIDAZOLAM HCL 2 MG/2ML IJ SOLN
INTRAMUSCULAR | Status: AC
  Filled 2022-08-25: qty 2

## 2022-08-25 NOTE — Procedures (Signed)
Interventional Radiology Procedure Note  Date of Procedure: 08/25/2022  Procedure: Port placement   Findings:  1. Right chest port placement    Complications: No immediate complications noted.   Estimated Blood Loss: minimal  Follow-up and Recommendations: 1. Ready for use    Albin Felling, MD  Vascular & Interventional Radiology  08/25/2022 10:25 AM

## 2022-08-25 NOTE — Progress Notes (Signed)
Patient clinically stable post Port placement right chest per Dr Denna Haggard, tolerated well. Vitals stable pre and post procedure, received Versed 2 mg along with Fentanyl 100 mcg IV for procedure. Post procedure report given to Gari Crown RN /330.

## 2022-08-26 ENCOUNTER — Encounter: Payer: Self-pay | Admitting: Oncology

## 2022-08-26 ENCOUNTER — Inpatient Hospital Stay: Payer: BLUE CROSS/BLUE SHIELD

## 2022-08-26 ENCOUNTER — Inpatient Hospital Stay (HOSPITAL_BASED_OUTPATIENT_CLINIC_OR_DEPARTMENT_OTHER): Payer: BLUE CROSS/BLUE SHIELD | Admitting: Oncology

## 2022-08-26 DIAGNOSIS — Z5111 Encounter for antineoplastic chemotherapy: Secondary | ICD-10-CM | POA: Diagnosis not present

## 2022-08-26 DIAGNOSIS — C642 Malignant neoplasm of left kidney, except renal pelvis: Secondary | ICD-10-CM

## 2022-08-26 DIAGNOSIS — C641 Malignant neoplasm of right kidney, except renal pelvis: Secondary | ICD-10-CM

## 2022-08-26 LAB — CBC WITH DIFFERENTIAL/PLATELET
Abs Immature Granulocytes: 0.1 10*3/uL — ABNORMAL HIGH (ref 0.00–0.07)
Basophils Absolute: 0.1 10*3/uL (ref 0.0–0.1)
Basophils Relative: 1 %
Eosinophils Absolute: 0.2 10*3/uL (ref 0.0–0.5)
Eosinophils Relative: 2 %
HCT: 41.3 % (ref 39.0–52.0)
Hemoglobin: 14.3 g/dL (ref 13.0–17.0)
Immature Granulocytes: 1 %
Lymphocytes Relative: 9 %
Lymphs Abs: 1.4 10*3/uL (ref 0.7–4.0)
MCH: 31.1 pg (ref 26.0–34.0)
MCHC: 34.6 g/dL (ref 30.0–36.0)
MCV: 89.8 fL (ref 80.0–100.0)
Monocytes Absolute: 1.6 10*3/uL — ABNORMAL HIGH (ref 0.1–1.0)
Monocytes Relative: 11 %
Neutro Abs: 11.8 10*3/uL — ABNORMAL HIGH (ref 1.7–7.7)
Neutrophils Relative %: 76 %
Platelets: 519 10*3/uL — ABNORMAL HIGH (ref 150–400)
RBC: 4.6 MIL/uL (ref 4.22–5.81)
RDW: 11.4 % — ABNORMAL LOW (ref 11.5–15.5)
WBC: 15.2 10*3/uL — ABNORMAL HIGH (ref 4.0–10.5)
nRBC: 0 % (ref 0.0–0.2)

## 2022-08-26 LAB — CMP (CANCER CENTER ONLY)
ALT: 30 U/L (ref 0–44)
AST: 23 U/L (ref 15–41)
Albumin: 3.7 g/dL (ref 3.5–5.0)
Alkaline Phosphatase: 128 U/L — ABNORMAL HIGH (ref 38–126)
Anion gap: 10 (ref 5–15)
BUN: 13 mg/dL (ref 8–23)
CO2: 28 mmol/L (ref 22–32)
Calcium: 10.3 mg/dL (ref 8.9–10.3)
Chloride: 93 mmol/L — ABNORMAL LOW (ref 98–111)
Creatinine: 1.05 mg/dL (ref 0.61–1.24)
GFR, Estimated: >60 mL/min (ref >60)
Glucose, Bld: 113 mg/dL — ABNORMAL HIGH (ref 70–99)
Potassium: 3.6 mmol/L (ref 3.5–5.1)
Sodium: 131 mmol/L — ABNORMAL LOW (ref 135–145)
Total Bilirubin: 1.1 mg/dL (ref 0.3–1.2)
Total Protein: 7.5 g/dL (ref 6.5–8.1)

## 2022-08-26 MED FILL — Dexamethasone Sodium Phosphate Inj 100 MG/10ML: INTRAMUSCULAR | Qty: 1 | Status: AC

## 2022-08-26 MED FILL — Fosaprepitant Dimeglumine For IV Infusion 150 MG (Base Eq): INTRAVENOUS | Qty: 5 | Status: AC

## 2022-08-26 NOTE — Progress Notes (Signed)
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  INTERVAL HISTORY: Patient returns to clinic today for further evaluation and initiation of cycle 1 of cisplatin and gemcitabine which will occur tomorrow.  His pain is better controlled.  He has no neurologic complaints.  He denies any recent fevers or illnesses.  He has no chest pain, shortness of breath, cough, or hemoptysis.  He denies any vomiting, constipation, or diarrhea.  He has no urinary complaints.  Patient offers no further specific complaints today.  REVIEW OF SYSTEMS:   Review of Systems  Constitutional: Negative.  Negative for fever, malaise/fatigue and weight loss.  Respiratory: Negative.  Negative for cough, hemoptysis and shortness of breath.   Cardiovascular: Negative.  Negative for chest pain and leg swelling.  Gastrointestinal:  Positive for nausea. Negative for abdominal pain.  Genitourinary:  Positive for flank pain. Negative for hematuria.  Musculoskeletal:  Negative for back pain.  Skin: Negative.  Negative for rash.  Neurological: Negative.  Negative for dizziness, focal weakness, weakness and headaches.  Psychiatric/Behavioral: Negative.  The patient is not nervous/anxious.     As per HPI. Otherwise, a complete review of systems is negative.  PAST MEDICAL HISTORY: Past Medical History:  Diagnosis Date   Cancer ([place]) 2013   bladder   Diverticulitis    History of chickenpox    History of measles    History of mumps    Hyperlipidemia    Hypertension     PAST SURGICAL HISTORY: Past Surgical History:  Procedure Laterality Date   HERNIA REPAIR  2002,2003    IR IMAGING GUIDED PORT INSERTION  08/25/2022   SIGMOIDOSCOPY  1998   [facility]   TRANSURETHRAL RESECTION OF BLADDER TUMOR  05/11/2012   Dr. Sherral Hammers, [place]  2002   Bilateral inguinal herniorrhaphies    FAMILY HISTORY: Family History  Problem Relation Age of Onset   COPD Mother    Diverticulosis Mother    Diverticulosis Father    Diabetes Father    Hypertension Father    Diverticulosis Sister    Stroke Paternal Grandmother     ADVANCED DIRECTIVES (Y/N):  N  HEALTH MAINTENANCE: Social History   Tobacco Use   Smoking status: Former    Packs/day: 2.00    Years: 39.00    Total pack years: 78.00    Types: Cigarettes    Quit date: 07/14/2011    Years since quitting: 11.1    Passive exposure: Past   Smokeless tobacco: Never   Tobacco comments:    smoked 1 to 1 1/2  ppd for 35 years  Vaping Use   Vaping Use: Never used  Substance Use Topics   Alcohol use: Yes    Comment: drinks 2 beers daily: none since 1 week ago   Drug use: No     Colonoscopy:  PAP:  Bone density:  Lipid panel:  Allergies  Allergen Reactions   Amoxicillin Itching   Atenolol Diarrhea    Other reaction(s): HIVES   Other Nausea And Vomiting    Navy Beans   Oxycodone     Other reaction(s): HIVES   Shellfish Allergy Itching and Swelling    Current Outpatient  Medications  Medication Sig Dispense Refill   docusate sodium (COLACE) 250 MG capsule Take 250 mg by mouth daily.     fentaNYL (DURAGESIC) 25 MCG/HR Place 1 patch onto the skin every 3 (three) days. 10 patch 0   HYDROcodone-acetaminophen (NORCO) 5-325 MG tablet Take 1 tablet by mouth every 6 (six) hours as needed for moderate pain. 120 tablet 0   lidocaine-prilocaine (EMLA) cream Apply to affected area once 30 g 3   losartan-hydrochlorothiazide (HYZAAR) 100-12.5 MG tablet TAKE 1 TABLET BY MOUTH EVERY DAY 90 tablet 0   metoprolol succinate (TOPROL-XL) 25 MG 24 hr tablet TAKE 1 TABLET (25 MG TOTAL) BY MOUTH DAILY.  90 tablet 2   Multiple Vitamins-Minerals (OCUVITE ADULT FORMULA PO) Take 1 capsule by mouth.     multivitamin-lutein (OCUVITE-LUTEIN) CAPS capsule Take 1 capsule by mouth daily.     ondansetron (ZOFRAN) 8 MG tablet Take 1 tablet (8 mg total) by mouth every 8 (eight) hours as needed for nausea or vomiting. 90 tablet 3   ondansetron (ZOFRAN-ODT) 8 MG disintegrating tablet Take 8 mg by mouth every 8 (eight) hours as needed.     pantoprazole (PROTONIX) 40 MG tablet TAKE 1 TABLET BY MOUTH EVERY DAY 90 tablet 2   prochlorperazine (COMPAZINE) 10 MG tablet Take 1 tablet (10 mg total) by mouth every 6 (six) hours as needed (Nausea or vomiting). 60 tablet 2   tamsulosin (FLOMAX) 0.4 MG CAPS capsule Take 1 capsule (0.4 mg total) by mouth daily. 30 capsule 11   aspirin 81 MG tablet Take 81 mg by mouth daily. (Patient not taking: Reported on 08/19/2022)     sildenafil (REVATIO) 20 MG tablet Take 1-3 tablets (20-60 mg total) by mouth daily as needed. (Patient not taking: Reported on 08/12/2022) 30 tablet 11   Specialty Vitamins Products ([facility])  (Patient not taking: Reported on 08/12/2022)     No current facility-administered medications for this visit.    OBJECTIVE: Vitals:   08/26/22 1038  BP: 99/72  Pulse: (!) 105  Resp: 16  Temp: 98 F (36.7 C)  SpO2: 100%     Body mass index is 25.81 kg/m.    ECOG FS:1 - Symptomatic but completely ambulatory  General: Well-developed, well-nourished, no acute distress. Eyes: Pink conjunctiva, anicteric sclera. HEENT: Normocephalic, moist mucous membranes. Lungs: No audible wheezing or coughing. Heart: Regular rate and rhythm. Abdomen: Soft, nontender, no obvious distention. Musculoskeletal: No edema, cyanosis, or clubbing. Neuro: Alert, answering all questions appropriately. Cranial nerves grossly intact. Skin: No rashes or petechiae noted. Psych: Normal affect.  LAB RESULTS:  Lab Results  Component Value Date   NA 131 (L)  08/26/2022   K 3.6 08/26/2022   CL 93 (L) 08/26/2022   CO2 28 08/26/2022   GLUCOSE 113 (H) 08/26/2022   BUN 13 08/26/2022   CREATININE 1.05 08/26/2022   CALCIUM 10.3 08/26/2022   PROT 7.5 08/26/2022   ALBUMIN 3.7 08/26/2022   AST 23 08/26/2022   ALT 30 08/26/2022   ALKPHOS 128 (H) 08/26/2022   BILITOT 1.1 08/26/2022   GFRNONAA >60 08/26/2022   GFRAA 90 06/10/2020    Lab Results  Component Value Date   WBC 15.2 (H) 08/26/2022   NEUTROABS 11.8 (H) 08/26/2022   HGB 14.3 08/26/2022   HCT 41.3 08/26/2022   MCV 89.8 08/26/2022   PLT 519 (H) 08/26/2022     STUDIES: IR IMAGING GUIDED PORT INSERTION  Result Date: 08/25/2022 INDICATION: Chemotherapy EXAM: Chest port placement using  ultrasound and fluoroscopic guidance MEDICATIONS: Documented in the EMR ANESTHESIA/SEDATION: Moderate (conscious) sedation was employed during this procedure. A total of Versed 2 mg and Fentanyl 100 mcg was administered intravenously. Moderate Sedation Time: 28 minutes. The patient's level of consciousness and vital signs were monitored continuously by radiology nursing throughout the procedure under my direct supervision. FLUOROSCOPY TIME:  Fluoroscopy Time: 0.4 minutes (1 mGy) COMPLICATIONS: None immediate. PROCEDURE: Informed written consent was obtained from the patient after a thorough discussion of the procedural risks, benefits and alternatives. All questions were addressed. Maximal Sterile Barrier Technique was utilized including caps, mask, sterile gowns, sterile gloves, sterile drape, hand hygiene and skin antiseptic. A timeout was performed prior to the initiation of the procedure. The patient was placed supine on the exam table. The right neck and chest was prepped and draped in the standard sterile fashion. A preliminary ultrasound of the right neck was performed and demonstrates a patent right internal jugular vein. A permanent ultrasound image was stored in the electronic medical record. The overlying  skin was anesthetized with 1% Lidocaine. Using ultrasound guidance, access was obtained into the right internal jugular vein using a 21 gauge micropuncture set. A wire was advanced into the SVC, a short incision was made at the puncture site, and serial dilatation performed. Next, in an ipsilateral infraclavicular location, an incision was made at the site of the subcutaneous reservoir. Blunt dissection was used to open a pocket to contain the reservoir. A subcutaneous tunnel was then created from the port site to the puncture site. A(n) 8 Fr single lumen catheter was advanced through the tunnel. The catheter was attached to the port and this was placed in the subcutaneous pocket. Under fluoroscopic guidance, a peel away sheath was placed, and the catheter was trimmed to the appropriate length and was advanced into the central veins. The catheter length is 22 cm. The tip of the catheter lies near the superior cavoatrial junction. The port flushes and aspirates appropriately. The port was flushed and locked with heparinized saline. The port pocket was closed in 2 layers using 3-0 and 4-0 Vicryl/absorbable suture. Dermabond was also applied to both incisions. The patient tolerated the procedure well and was transferred to recovery in stable condition. IMPRESSION: Successful placement of a right-sided chest port via the right internal jugular vein. The port is ready for use. Electronically Signed   By: Albin Felling M.D.   On: 08/25/2022 12:10   NM PET Image Initial (PI) Skull Base To Thigh  Result Date: 08/14/2022 CLINICAL DATA:  Subsequent treatment strategy for metastatic bladder cancer. Retroperitoneal node biopsy 08/12/2022 (pending). EXAM: NUCLEAR MEDICINE PET SKULL BASE TO THIGH TECHNIQUE: 9.72 mCi F-18 FDG was injected intravenously. Full-ring PET imaging was performed from the skull base to thigh after the radiotracer. CT data was obtained and used for attenuation correction and anatomic localization.  Fasting blood glucose: 96 mg/dl COMPARISON:  Chest CT 08/05/2022.  Abdominopelvic CT 07/31/2022. FINDINGS: Mediastinal blood pool activity: SUV max 1.3 NECK: No hypermetabolic cervical lymph nodes are identified. No suspicious activity identified within the pharyngeal mucosal space. Incidental CT findings: none CHEST: There are no hypermetabolic mediastinal, hilar or axillary lymph nodes. Best shown on recent CT, there are innumerable pulmonary nodules bilaterally which demonstrate hypermetabolic activity, highly concerning for metastatic disease. The individual nodules are suboptimally assessed due to breathing artifact and misregistration with the PET data. The most intense metabolic activity is in the right perihilar region (SUV max 5.0). A 9 mm right lower lobe  nodule on image 112/2 has an SUV max of 2.8. Additional nodules include a 9 mm left upper lobe nodule on image 104/2 and a left lower lobe nodule measuring 10 mm on image 116/2. Incidental CT findings: Mild aortic and coronary artery atherosclerosis. ABDOMEN/PELVIS: There is no hypermetabolic activity within the liver, adrenal glands, spleen or pancreas. As shown on recent CT, there is a large centrally necrotic mass involving the posterior aspect of the right kidney, measuring up to 6.0 x 4.2 cm on image 168/2. This demonstrates peripheral hypermetabolic activity and as correlated with recent CT is consistent with renal cell carcinoma. The retroperitoneal adenopathy seen on CT is hypermetabolic. For example, lymph nodes along the right renal vessels measuring up to 4.0 x 2.2 cm on image 170/2 have an SUV max of 9.5. Aortocaval node measuring 3.1 x 1.9 cm on image 187/2 has an SUV max of 8.8, and there is a left periaortic node measuring 11 mm on image 167/2 which has an SUV max of 6.6. No hypermetabolic nodes are identified in the pelvis. No definite abnormality of the bladder or prostate gland. Incidental CT findings: Simple appearing cyst in the upper  pole of the left kidney appears stable, without worrisome features. No follow-up imaging recommended. Aortic and branch vessel atherosclerosis. Mild diverticulosis of the descending and sigmoid colon. Prostatomegaly. SKELETON: There is no hypermetabolic activity to suggest osseous metastatic disease. Incidental CT findings: Mild spondylosis. 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 IMPRESSION: 1. Large centrally necrotic right renal mass with peripheral hypermetabolic activity consistent with renal cell carcinoma. 2. Hypermetabolic retroperitoneal adenopathy and innumerable hypermetabolic pulmonary nodules bilaterally consistent with metastatic disease, likely from renal cell carcinoma. 3. No pelvic adenopathy or definite bladder abnormality to suggest bladder cancer. No evidence of osseous metastatic disease. 4. 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 5.  Aortic Atherosclerosis (ICD10-I70.0). Electronically Signed   By: Richardean Sale M.D.   On: 08/14/2022 14:47   CT ABDOMINAL MASS BIOPSY  Result Date: 08/12/2022 INDICATION: Metastatic retroperitoneal adenopathy, right renal cell carcinoma by imaging EXAM: CT-GUIDED BIOPSY METASTATIC RIGHT RETROPERITONEAL ADENOPATHY MEDICATIONS: 1% LIDOCAINE LOCAL ANESTHESIA/SEDATION: 1.0 mg IV Versed; 50 mcg IV Fentanyl Moderate Sedation Time:  10 MINUTE The patient was continuously monitored during the procedure by the interventional radiology nurse under my direct supervision. PROCEDURE: The procedure, risks, benefits, and alternatives were explained to the patient. Questions regarding the procedure were encouraged and answered. The patient understands and consents to the procedure. Previous imaging reviewed. Patient positioned prone. Noncontrast localization CT performed. The right retroperitoneal adenopathy was localized and marked for a right posterior  paraspinous approach. Under sterile conditions and local anesthesia, the 17 gauge 11.8 cm guide was advanced from a right posterior paraspinous approach to the adenopathy. Needle position confirmed with CT. 18 gauge core biopsies obtained. These were placed in formalin. Samples were intact and non fragmented. Needle removed. Postprocedure imaging demonstrates no hemorrhage or hematoma. Patient tolerated the procedure well without complication. Vital sign monitoring by nursing staff during the procedure will continue as patient is in the special procedures unit for post procedure observation. FINDINGS: The images document guide needle placement within the metastatic retroperitoneal adenopathy. Post biopsy images demonstrate no hemorrhage or hematoma. COMPLICATIONS: None immediate. IMPRESSION: Successful CT-guided core biopsy of metastatic retroperitoneal adenopathy. Marland Kitchen RADIATION DOSE REDUCTION: This exam was performed according to the departmental dose-optimization program which includes automated exposure control, adjustment of the  mA and/or kV according to patient size and/or use of iterative reconstruction technique. Electronically Signed   By: Jerilynn Mages.  Shick M.D.   On: 08/12/2022 11:57   CT CHEST LUNG CA SCREEN LOW DOSE W/O CM  Result Date: 08/06/2022 CLINICAL DATA:  Former smoker with 70 pack-year history EXAM: CT CHEST WITHOUT CONTRAST LOW-DOSE FOR LUNG CANCER SCREENING TECHNIQUE: Multidetector CT imaging of the chest was performed following the standard protocol without IV contrast. RADIATION DOSE REDUCTION: This exam was performed according to the departmental dose-optimization program which includes automated exposure control, adjustment of the mA and/or kV according to patient size and/or use of iterative reconstruction technique. COMPARISON:  Lung cancer screening CT dated August 05, 2021; CT of the abdomen and pelvis dated July 31, 2022 FINDINGS: Cardiovascular: Normal heart size. No pericardial  effusion. Normal caliber thoracic aorta with mild calcified plaque. Mediastinum/Nodes: Esophagus and thyroid are unremarkable. No pathologically enlarged lymph nodes seen in the chest. Lungs/Pleura: Central airways are patent. No consolidation, pleural effusion or pneumothorax. Numerous bilateral solid pulmonary nodules nodules which on recent prior CT of the abdomen and pelvis are unchanged when compared with most recent prior lung cancer screening CT nodules are new. Reference nodule of the right lower lobe measuring 12 mm in mean diameter on image 166. Reference nodule of the left lower lobe measuring 11.4 mm in mean diameter on image 179. Upper Abdomen: No acute abnormality. Musculoskeletal: No chest wall mass or suspicious bone lesions identified. IMPRESSION: 1. Numerous solid pulmonary nodules, highly concerning for metastatic disease given presence of renal mass on recent CT of the abdomen and pelvis. Lung-RADS 4X, highly suspicious. Additional imaging evaluation or consultation with Pulmonology or Thoracic Surgery recommended. 2. Aortic Atherosclerosis (ICD10-I70.0) and Emphysema (ICD10-J43.9). Electronically Signed   By: Yetta Glassman M.D.   On: 08/06/2022 09:13  CT HEMATURIA WORKUP  Result Date: 07/31/2022 CLINICAL DATA:  Abdominal pain with gross hematuria. History of bladder cancer. * Tracking Code: BO * EXAM: CT ABDOMEN AND PELVIS WITHOUT AND WITH CONTRAST TECHNIQUE: Multidetector CT imaging of the abdomen and pelvis was performed following the standard protocol before and following the bolus administration of intravenous contrast. RADIATION DOSE REDUCTION: This exam was performed according to the departmental dose-optimization program which includes automated exposure control, adjustment of the mA and/or kV according to patient size and/or use of iterative reconstruction technique. CONTRAST:  110m OMNIPAQUE IOHEXOL 300 MG/ML  SOLN COMPARISON:  06/19/2020 FINDINGS: Lower chest: New bilateral  pulmonary nodules are too numerous to count. Index left upper lobe nodule measures 9 mm on image 6/17. Index right lower lobe nodule measures 9 mm on 19/17. Index left lower lobe nodule measures 7 mm on 13/17. No pleural effusion. Hepatobiliary: No suspicious focal abnormality within the liver parenchyma. There is no evidence for gallstones, gallbladder wall thickening, or pericholecystic fluid. No intrahepatic or extrahepatic biliary dilation. Pancreas: No focal mass lesion. No dilatation of the main duct. No intraparenchymal cyst. No peripancreatic edema. Spleen: No splenomegaly. No focal mass lesion. Adrenals/Urinary Tract: No adrenal nodule or mass. Since 06/19/2020, the patient has developed a poorly defined 6.0 x 4.6 x 7.3 cm hypoenhancing mass in the upper and posterior interpolar region of the right kidney. This lesion has an almost infiltrative appearance in some regions with persistent central hypoenhancement on delayed imaging. Involvement of a posterior calyx is best appreciated on axial 41/7. Abnormal soft tissue tracks around the right renal artery, but no evidence for filling defect in the right renal vein. 2.5 cm exophytic  simple cyst noted upper pole left kidney. No hydroureter.  No focal bladder wall abnormality. Stomach/Bowel: Stomach is unremarkable. No gastric wall thickening. No evidence of outlet obstruction. Duodenum is normally positioned as is the ligament of Treitz. No small bowel wall thickening. No small bowel dilatation. The terminal ileum is normal. The appendix is normal. No gross colonic mass. No colonic wall thickening. Vascular/Lymphatic: There is mild atherosclerotic calcification of the abdominal aorta without aneurysm. Confluent lymphadenopathy is identified in the right retroperitoneal space with 2.3 cm short axis retrocaval node visible on 37/7 and a 1.6 cm short axis aortocaval node visible on 40/7. Index 1.7 cm precaval node is visible lower in the pelvis on 50/7. This  lymphadenopathy does generates some mass-effect on the IVC. No pelvic sidewall lymphadenopathy. Reproductive: Prostate gland is enlarged. Other: No intraperitoneal free fluid. Musculoskeletal: No worrisome lytic or sclerotic osseous abnormality. Degenerative disc disease noted L2-3 and L3-4. Bilateral pars interarticularis defects noted at L5. IMPRESSION: 1. Interval development of a poorly defined 6.0 x 4.6 x 7.3 cm hypoenhancing mass in the upper and posterior interpolar region of the right kidney. This lesion has an almost infiltrative appearance in some areas with persistent central hypoenhancement on delayed imaging. Lesion extends into a posterior interpolar calyx. Abnormal soft tissue tracks around the right renal artery, but no evidence for filling defect in the right renal vein. Imaging features are compatible with neoplasm. Transitional cell carcinoma is somewhat favored given the hypoenhancement although clear cell renal cell carcinoma could also have this appearance. 2. Confluent lymphadenopathy in the right retroperitoneal space, consistent with metastatic disease. 3. New bilateral pulmonary nodules are too numerous to count, consistent with metastatic disease. 4. Prostatomegaly. 5. 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 6.  Aortic Atherosclerosis (ICD10-I70.0). These results will be called to the ordering clinician or representative by the Radiologist Assistant, and communication documented in the PACS or [employer]. Electronically Signed   By: Misty Stanley M.D.   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  This lesion was not evident on CT scan from  June 19, 2020.  PET scan results from August 14, 2022 reviewed independently and reported as above with hypermetabolic kidney mass, retroperitoneal lymphadenopathy and multiple bilateral pulmonary nodules consistent with metastatic disease.  Biopsy confirmed diagnosis.  Patient will benefit from systemic chemotherapy using cisplatin and gemcitabine on day 1 with gemcitabine only on day 8.  This will be a 21-day cycle.  Patient has had port placement.  Return to clinic tomorrow for cycle 1, day 1 of treatment.  Patient would then return to clinic in 1 week for further evaluation and consideration of cycle 1, day 8.    Pain: Continue no patch and Norco as needed. Nausea: Continue antiemetics as needed.   Patient expressed understanding and was in agreement with this plan. He also understands that He can call clinic at any time with any questions, concerns, or complaints.    Cancer Staging  Urothelial carcinoma of kidney [facility]) Staging form: Kidney, AJCC 8th Edition - Clinical stage from 08/19/2022: Stage IV (cT4, cN1, cM1) - Signed by Lloyd Huger, MD on 08/19/2022   Lloyd Huger, MD  08/26/2022 12:55 PM

## 2022-08-26 NOTE — Research (Signed)
Trial:  [id number]: A RANDOMIZED TRIAL ADDRESSING CANCER-RELATED FINANCIAL HARDSHIP THROUGH DELIVERY OF A PROACTIVE FINANCIAL NAVIGATION INTERVENTION (CREDIT)  Patient Jeffrey Fernandez was identified by this nurse as a potential candidate for the above listed study.  This Clinical Research Nurse met with [name], [id number], on 08/26/22 in a manner and location that ensures patient privacy to discuss participation in the above listed research study.  Patient is Accompanied by his son .  A copy of the informed consent document and separate HIPAA Authorization was provided to the patient.  Patient reads, speaks, and understands [place].   Patient was provided with the business card of this Nurse and encouraged to contact the research team with any questions.  Approximately 20 minutes were spent with the patient reviewing the informed consent documents.  Patient was provided the option of taking informed consent documents home to review and was encouraged to review at their convenience with their support network, including other care providers. Patient took the consent documents home to review. Research nurse will meet with patient in the am at his next appointment to ascertain his interest in participating in the clinical trial.  Jeral Fruit, RN 08/26/22 11:04 AM

## 2022-08-27 ENCOUNTER — Inpatient Hospital Stay: Payer: BLUE CROSS/BLUE SHIELD

## 2022-08-27 ENCOUNTER — Other Ambulatory Visit: Payer: BLUE CROSS/BLUE SHIELD

## 2022-08-27 ENCOUNTER — Ambulatory Visit: Payer: BLUE CROSS/BLUE SHIELD

## 2022-08-27 ENCOUNTER — Ambulatory Visit: Payer: BLUE CROSS/BLUE SHIELD | Admitting: Oncology

## 2022-08-27 VITALS — BP 119/73 | HR 86 | Temp 97.2°F | Resp 16

## 2022-08-27 DIAGNOSIS — Z5111 Encounter for antineoplastic chemotherapy: Secondary | ICD-10-CM | POA: Diagnosis not present

## 2022-08-27 DIAGNOSIS — C641 Malignant neoplasm of right kidney, except renal pelvis: Secondary | ICD-10-CM

## 2022-08-27 MED ORDER — SODIUM CHLORIDE 0.9 % IV SOLN
70.0000 mg/m2 | Freq: Once | INTRAVENOUS | Status: AC
  Administered 2022-08-27: 150 mg via INTRAVENOUS
  Filled 2022-08-27: qty 150

## 2022-08-27 MED ORDER — SODIUM CHLORIDE 0.9 % IV SOLN
1000.0000 mg/m2 | Freq: Once | INTRAVENOUS | Status: AC
  Administered 2022-08-27: 1977 mg via INTRAVENOUS
  Filled 2022-08-27: qty 52

## 2022-08-27 MED ORDER — SODIUM CHLORIDE 0.9 % IV SOLN
Freq: Once | INTRAVENOUS | Status: AC
  Filled 2022-08-27: qty 250

## 2022-08-27 MED ORDER — PALONOSETRON HCL INJECTION 0.25 MG/5ML
0.2500 mg | Freq: Once | INTRAVENOUS | Status: AC
  Administered 2022-08-27: 0.25 mg via INTRAVENOUS
  Filled 2022-08-27: qty 5

## 2022-08-27 MED ORDER — POTASSIUM CHLORIDE IN NACL 20-0.9 MEQ/L-% IV SOLN
Freq: Once | INTRAVENOUS | Status: AC
  Filled 2022-08-27: qty 1000

## 2022-08-27 MED ORDER — MAGNESIUM SULFATE 2 GM/50ML IV SOLN
2.0000 g | Freq: Once | INTRAVENOUS | Status: AC
  Administered 2022-08-27: 2 g via INTRAVENOUS
  Filled 2022-08-27: qty 50

## 2022-08-27 MED ORDER — SODIUM CHLORIDE 0.9 % IV SOLN
10.0000 mg | Freq: Once | INTRAVENOUS | Status: AC
  Administered 2022-08-27: 10 mg via INTRAVENOUS
  Filled 2022-08-27: qty 10

## 2022-08-27 MED ORDER — HEPARIN SOD (PORK) LOCK FLUSH 100 UNIT/ML IV SOLN
500.0000 [IU] | Freq: Once | INTRAVENOUS | Status: AC | PRN
  Administered 2022-08-27: 500 [IU]
  Filled 2022-08-27: qty 5

## 2022-08-27 MED ORDER — SODIUM CHLORIDE 0.9 % IV SOLN
150.0000 mg | Freq: Once | INTRAVENOUS | Status: AC
  Administered 2022-08-27: 150 mg via INTRAVENOUS
  Filled 2022-08-27: qty 150

## 2022-08-27 NOTE — Progress Notes (Signed)
Per Dr. Grayland Ormond, ok to run cisplatin concurrently with post hydration fluids

## 2022-08-27 NOTE — Patient Instructions (Signed)
[facility]  Discharge Instructions: Thank you for choosing [facility] to provide your oncology and hematology care.  If you have a lab appointment with the [facility], please go directly to the [facility] and check in at the registration area.  Wear comfortable clothing and clothing appropriate for easy access to any Portacath or PICC line.   We strive to give you quality time with your provider. You may need to reschedule your appointment if you arrive late (15 or more minutes).  Arriving late affects you and other patients whose appointments are after yours.  Also, if you miss three or more appointments without notifying the office, you may be dismissed from the clinic at the provider's discretion.      For prescription refill requests, have your pharmacy contact our office and allow 72 hours for refills to be completed.    Today you received the following chemotherapy and/or immunotherapy agents Cisplatin, Gemzar      To help prevent nausea and vomiting after your treatment, we encourage you to take your nausea medication as directed.  BELOW ARE SYMPTOMS THAT SHOULD BE REPORTED IMMEDIATELY: *FEVER GREATER THAN 100.4 F (38 C) OR HIGHER *CHILLS OR SWEATING *NAUSEA AND VOMITING THAT IS NOT CONTROLLED WITH YOUR NAUSEA MEDICATION *UNUSUAL SHORTNESS OF BREATH *UNUSUAL BRUISING OR BLEEDING *URINARY PROBLEMS (pain or burning when urinating, or frequent urination) *BOWEL PROBLEMS (unusual diarrhea, constipation, pain near the anus) TENDERNESS IN MOUTH AND THROAT WITH OR WITHOUT PRESENCE OF ULCERS (sore throat, sores in mouth, or a toothache) UNUSUAL RASH, SWELLING OR PAIN  UNUSUAL VAGINAL DISCHARGE OR ITCHING   Items with * indicate a potential emergency and should be followed up as soon as possible or go to the Emergency Department if any problems should occur.  Please show the CHEMOTHERAPY ALERT CARD or IMMUNOTHERAPY ALERT CARD at  check-in to the Emergency Department and triage nurse.  Should you have questions after your visit or need to cancel or reschedule your appointment, please contact [facility]  [phone] and follow the prompts.  Office hours are 8:00 a.m. to 4:30 p.m. Monday - Friday. Please note that voicemails left after 4:00 p.m. may not be returned until the following business day.  We are closed weekends and major holidays. You have access to a nurse at all times for urgent questions. Please call the main number to the clinic [phone] and follow the prompts.  For any non-urgent questions, you may also contact your provider using MyChart. We now offer e-Visits for anyone 37 and older to request care online for non-urgent symptoms. For details visit [email or web address].   Also download the MyChart app! Go to the app store, search "MyChart", open the app, select [facility], and log in with your MyChart username and password.

## 2022-08-27 NOTE — Research (Signed)
Trial: [id number]-001: Use of a Clinical Trial Screening Tool to Address Cancer Health Disparities in the [facility] [place])   Patient Jeffrey Fernandez was identified by this nurse as a potential candidate for the above listed study.  This Clinical Research Nurse met with Jeffrey Fernandez, [id number], on 08/27/22 in a manner and location that ensures patient privacy to discuss participation in the above listed research study.  Patient is Accompanied by [name] .  A copy of the informed consent document and separate HIPAA Authorization was provided to the patient.  Patient reads, speaks, and understands [place].    Patient was provided with the business card of this Nurse and encouraged to contact the research team with any questions.  Patient was provided the option of taking informed consent documents home to review and was encouraged to review at their convenience with their support network, including other care providers. Patient is comfortable with making a decision regarding study participation today.  As outlined in the informed consent form, this Nurse and Jeffrey Fernandez discussed the purpose of the research study, the investigational nature of the study, study procedures and requirements for study participation, potential risks and benefits of study participation, as well as alternatives to participation. This study is not blinded. The patient understands participation is voluntary and they may withdraw from study participation at any time.  This study does not involve randomization.  This study does not involve an investigational drug or device. This study does not involve a placebo. Patient understands enrollment is pending full eligibility review.   Confidentiality and how the patient's information will be used as part of study participation were discussed.  Patient was informed there is not reimbursement provided for their time and effort spent on trial participation.   The patient is encouraged to discuss research study participation with their insurance provider to determine what costs they may incur as part of study participation, including research related injury.    All questions were answered to patient's satisfaction.  The informed consent and separate HIPAA Authorization was reviewed page by page.  The patient's mental and emotional status is appropriate to provide informed consent, and the patient verbalizes an understanding of study participation.  Patient has agreed to participate in the above listed research study and has voluntarily signed the informed consent dated 02/27/2022  and separate HIPAA Authorization, version 15  on 08/27/22 at 1106AM.  The patient was provided with a copy of the signed informed consent form and separate HIPAA Authorization for their reference.  No study specific procedures were obtained prior to the signing of the informed consent document.  Approximately 30 minutes were spent with the patient reviewing the informed consent documents.  After obtaining informed consent patient, voluntarily signed the optional Release of Information form for use throughout trial participation.  Protocol patient worksheet completed with this nurse and the patient. The patient is aware that this involves one-time consent, and collection of demographic variables, with the majority of data collected from his medical record. Noted that no patient identifiers are being reported via the screening too.  Jeral Fruit, RN 08/27/22 2:39 PM

## 2022-08-27 NOTE — Research (Signed)
Trial Name:  [id number]: A RANDOMIZED TRIAL ADDRESSING CANCER-RELATED FINANCIAL HARDSHIP THROUGH DELIVERY OF A PROACTIVE FINANCIAL NAVIGATION INTERVENTION (CREDIT)   Patient Jeffrey Fernandez was identified by this nurse as a potential candidate for the above listed study.  This Clinical Research Nurse met with Jeffrey Fernandez, [id number] on 08/27/22 in a manner and location that ensures patient privacy to discuss participation in the above listed research study.  Patient is Accompanied by [name] .  Patient was previously provided with informed consent documents.  Patient confirmed they have read the informed consent documents.  As outlined in the informed consent form, this Nurse and Jeffrey Fernandez discussed the purpose of the research study, the investigational nature of the study, study procedures and requirements for study participation, potential risks and benefits of study participation, as well as alternatives to participation.  This study is not blinded or double-blinded. The patient understands participation is voluntary and they may withdraw from study participation at any time.  Each study arm was reviewed, and randomization discussed.  This study does not involve an investigational drug or device. This study does not involve a placebo. Patient understands enrollment is pending full eligibility review.   Confidentiality and how the patient's information will be used as part of study participation were discussed.  Patient was informed there is not reimbursement provided for their time and effort spent on trial participation.  The patient is encouraged to discuss research study participation with their insurance provider to determine what costs they may incur as part of study participation, including research related injury.    All questions were answered to patient's satisfaction.  The informed consent and separate HIPAA Authorization was reviewed page by page.  The patient's mental and  emotional status is appropriate to provide informed consent, and the patient verbalizes an understanding of study participation.  Patient has agreed to participate in the above listed research study and has voluntarily signed the informed consent version dated 02/27/2022 with embedded HIPAA language, version 5  on 08/27/22 at 1106AM.  The patient was provided with a copy of the signed informed consent form and separate HIPAA Authorization for their reference.  No study specific procedures were obtained prior to the signing of the informed consent document.  Approximately 30 minutes were spent with the patient reviewing the informed consent documents.  After obtaining informed consent patient, voluntarily signed the optional Release of Information form for use throughout trial participation.  Jeral Fruit, RN 08/27/22 12:05 PM

## 2022-08-28 ENCOUNTER — Telehealth: Payer: Self-pay

## 2022-08-28 NOTE — Telephone Encounter (Signed)
Telephone call to patient for follow up after receiving first infusion.   Patient states infusion went great.  States eating good and drinking plenty of fluids.   Denies any nausea or vomiting.  Encouraged patient to call for any concerns or questions. 

## 2022-08-30 ENCOUNTER — Encounter: Payer: Self-pay | Admitting: Oncology

## 2022-08-31 ENCOUNTER — Telehealth: Payer: Self-pay | Admitting: *Deleted

## 2022-08-31 ENCOUNTER — Other Ambulatory Visit: Payer: Self-pay

## 2022-08-31 ENCOUNTER — Inpatient Hospital Stay: Payer: BLUE CROSS/BLUE SHIELD

## 2022-08-31 ENCOUNTER — Inpatient Hospital Stay (HOSPITAL_BASED_OUTPATIENT_CLINIC_OR_DEPARTMENT_OTHER): Payer: BLUE CROSS/BLUE SHIELD | Admitting: Nurse Practitioner

## 2022-08-31 ENCOUNTER — Encounter: Payer: Self-pay | Admitting: Nurse Practitioner

## 2022-08-31 VITALS — BP 106/83 | HR 102 | Temp 98.6°F | Resp 18

## 2022-08-31 VITALS — BP 113/79 | HR 97

## 2022-08-31 DIAGNOSIS — C641 Malignant neoplasm of right kidney, except renal pelvis: Secondary | ICD-10-CM

## 2022-08-31 DIAGNOSIS — D72829 Elevated white blood cell count, unspecified: Secondary | ICD-10-CM

## 2022-08-31 DIAGNOSIS — Z5189 Encounter for other specified aftercare: Secondary | ICD-10-CM

## 2022-08-31 DIAGNOSIS — R8290 Unspecified abnormal findings in urine: Secondary | ICD-10-CM

## 2022-08-31 DIAGNOSIS — Z5111 Encounter for antineoplastic chemotherapy: Secondary | ICD-10-CM | POA: Diagnosis not present

## 2022-08-31 DIAGNOSIS — R7989 Other specified abnormal findings of blood chemistry: Secondary | ICD-10-CM

## 2022-08-31 DIAGNOSIS — T451X5A Adverse effect of antineoplastic and immunosuppressive drugs, initial encounter: Secondary | ICD-10-CM

## 2022-08-31 DIAGNOSIS — R110 Nausea: Secondary | ICD-10-CM

## 2022-08-31 DIAGNOSIS — R066 Hiccough: Secondary | ICD-10-CM

## 2022-08-31 DIAGNOSIS — Z95828 Presence of other vascular implants and grafts: Secondary | ICD-10-CM

## 2022-08-31 DIAGNOSIS — E860 Dehydration: Secondary | ICD-10-CM

## 2022-08-31 LAB — URINALYSIS, COMPLETE (UACMP) WITH MICROSCOPIC
Bacteria, UA: NONE SEEN
Bilirubin Urine: NEGATIVE
Glucose, UA: NEGATIVE mg/dL
Ketones, ur: NEGATIVE mg/dL
Leukocytes,Ua: NEGATIVE
Nitrite: NEGATIVE
Protein, ur: 30 mg/dL — AB
Specific Gravity, Urine: 1.018 (ref 1.005–1.030)
Squamous Epithelial / HPF: NONE SEEN /HPF (ref 0–5)
pH: 5 (ref 5.0–8.0)

## 2022-08-31 LAB — CMP (CANCER CENTER ONLY)
ALT: 89 U/L — ABNORMAL HIGH (ref 0–44)
AST: 46 U/L — ABNORMAL HIGH (ref 15–41)
Albumin: 3.7 g/dL (ref 3.5–5.0)
Alkaline Phosphatase: 176 U/L — ABNORMAL HIGH (ref 38–126)
Anion gap: 13 (ref 5–15)
BUN: 29 mg/dL — ABNORMAL HIGH (ref 8–23)
CO2: 28 mmol/L (ref 22–32)
Calcium: 9.8 mg/dL (ref 8.9–10.3)
Chloride: 85 mmol/L — ABNORMAL LOW (ref 98–111)
Creatinine: 1.45 mg/dL — ABNORMAL HIGH (ref 0.61–1.24)
GFR, Estimated: 53 mL/min — ABNORMAL LOW (ref >60)
Glucose, Bld: 109 mg/dL — ABNORMAL HIGH (ref 70–99)
Potassium: 3.7 mmol/L (ref 3.5–5.1)
Sodium: 126 mmol/L — ABNORMAL LOW (ref 135–145)
Total Bilirubin: 1.2 mg/dL (ref 0.3–1.2)
Total Protein: 8.2 g/dL — ABNORMAL HIGH (ref 6.5–8.1)

## 2022-08-31 LAB — CBC WITH DIFFERENTIAL (CANCER CENTER ONLY)
Abs Immature Granulocytes: 0.12 10*3/uL — ABNORMAL HIGH (ref 0.00–0.07)
Basophils Absolute: 0.1 10*3/uL (ref 0.0–0.1)
Basophils Relative: 0 %
Eosinophils Absolute: 0.3 10*3/uL (ref 0.0–0.5)
Eosinophils Relative: 2 %
HCT: 43.7 % (ref 39.0–52.0)
Hemoglobin: 15 g/dL (ref 13.0–17.0)
Immature Granulocytes: 1 %
Lymphocytes Relative: 5 %
Lymphs Abs: 1 10*3/uL (ref 0.7–4.0)
MCH: 30.9 pg (ref 26.0–34.0)
MCHC: 34.3 g/dL (ref 30.0–36.0)
MCV: 90.1 fL (ref 80.0–100.0)
Monocytes Absolute: 0.3 10*3/uL (ref 0.1–1.0)
Monocytes Relative: 2 %
Neutro Abs: 16 10*3/uL — ABNORMAL HIGH (ref 1.7–7.7)
Neutrophils Relative %: 90 %
Platelet Count: 404 10*3/uL — ABNORMAL HIGH (ref 150–400)
RBC: 4.85 MIL/uL (ref 4.22–5.81)
RDW: 11.8 % (ref 11.5–15.5)
WBC Count: 17.8 10*3/uL — ABNORMAL HIGH (ref 4.0–10.5)
nRBC: 0 % (ref 0.0–0.2)

## 2022-08-31 LAB — MAGNESIUM: Magnesium: 2.3 mg/dL (ref 1.7–2.4)

## 2022-08-31 MED ORDER — HEPARIN SOD (PORK) LOCK FLUSH 100 UNIT/ML IV SOLN
500.0000 [IU] | Freq: Once | INTRAVENOUS | Status: AC
  Administered 2022-08-31: 500 [IU]
  Filled 2022-08-31: qty 5

## 2022-08-31 MED ORDER — ONDANSETRON HCL 4 MG/2ML IJ SOLN
8.0000 mg | Freq: Once | INTRAMUSCULAR | Status: AC
  Administered 2022-08-31: 8 mg via INTRAVENOUS
  Filled 2022-08-31: qty 4

## 2022-08-31 MED ORDER — PROCHLORPERAZINE EDISYLATE 10 MG/2ML IJ SOLN
10.0000 mg | Freq: Once | INTRAMUSCULAR | Status: AC
  Administered 2022-08-31: 10 mg via INTRAVENOUS
  Filled 2022-08-31: qty 2

## 2022-08-31 MED ORDER — SODIUM CHLORIDE 0.9% FLUSH
10.0000 mL | Freq: Once | INTRAVENOUS | Status: AC
  Administered 2022-08-31: 10 mL via INTRAVENOUS
  Filled 2022-08-31: qty 10

## 2022-08-31 MED ORDER — SODIUM CHLORIDE 0.9 % IV SOLN
INTRAVENOUS | Status: DC
  Filled 2022-08-31 (×3): qty 250

## 2022-08-31 NOTE — Telephone Encounter (Signed)
Patient's wife sent mychart msg. Pt c/o of ringing in both ears since Sat 2/17. Numbness in right hand. He believes the numbness is coming from "changing positions in chair. When I change positions-the numbness goes away." Also reports that the nausea medications cause headaches and does not help with the nausea. He is not eating very much. Eating small snacks- bowl of cereal and few bites of gold fish. No BM since Friday.  I personally reached out to the patient. Pt reports that his are slightly improved, but pt is agreeable to come into the clinic this am. I will add pt's to Lauren's schedule. apts to be added for lab/ smc/ iv fluids. Pt will arrive at 10 am.  Constipation is now resolved as pt had a BM this morning. He stated that he felt that the zofran was causing a headache and did not help the nausea. He stopped the zofran and took off the fentanyl patches to see if ceasing these medications would improve the headaches and ringing of the ears. He doesn't feel like he can tolerate the zofran very well.

## 2022-08-31 NOTE — Research (Signed)
Patient in the clinic for symptom management today. Research nurse met with patient briefly today. Patient agreeable to complete his questionnaires for the S1912 CD protocol today. Research nurse explained to patient that the questionnaires needed to be completed prior to his registration to the study. His daughter, Naaman Plummer agreed to help him complete them since he is a little weak and not feeling well today. Naaman Plummer will let the nurses know when they are finished for research to pick up.  Jeral Fruit, RN 08/31/22 12:11 PM

## 2022-08-31 NOTE — Progress Notes (Signed)
Symptom Management [place] [facility] [place]. [facility] [address], [place], [place] [address] [phone] (phone) [phone] (fax)  Patient Care Team: Birdie Sons, MD as PCP - General (Family Medicine) Billey Co, MD as Consulting Physician (Urology) Lloyd Huger, MD as Consulting Physician (Oncology)   Name of the patient: Jeffrey Fernandez  [id number]  1955-12-09   Date of visit: 08/31/22  Diagnosis- Urothelial Carcinoma of Kidney  Chief complaint/ Reason for visit- nausea, malaise, hiccups  Heme/Onc history:  Oncology History  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[place])  08/19/2022 Initial Diagnosis   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[place])   08/19/2022 Cancer Staging   Staging form: Kidney, AJCC 8th Edition - Clinical stage from 08/19/2022: Stage IV (cT4, cN1, cM1) - Signed by Lloyd Huger, MD on 08/19/2022   08/27/2022 -  Chemotherapy   Patient is on Treatment Plan : Urothelial carcinoma of kidney: Cisplatin D1 + Gemcitabine D1,8 q21d x 6 Cycles       Interval history-patient 67 year old male who presents to symptom management clinic for complaints of nausea, fatigue and malaise, headache, and hiccups.  Symptoms started on Saturday.  He received his first cycle of cisplatin gemcitabine chemotherapy last week.  Was constipated but that resolved this morning.  He has not taken any medications today.  He has held nausea and pain medicine due to concerns that he was experiencing side effects.  Has limited oral intake which is ongoing. Urine is malodorous and dark. No fevers or chills. Also complains of tinnitus.    Review of systems- Review of Systems  Constitutional:  Positive for malaise/fatigue. Negative for chills, fever and weight loss.  HENT:  Positive for tinnitus. Negative for hearing loss, nosebleeds and sore throat.   Eyes:  Negative for blurred vision and double  vision.  Respiratory:  Positive for cough. Negative for hemoptysis, shortness of breath and wheezing.   Cardiovascular:  Negative for chest pain, palpitations and leg swelling.  Gastrointestinal:  Positive for nausea. Negative for abdominal pain, blood in stool, constipation, diarrhea, melena and vomiting.  Genitourinary:  Negative for dysuria, flank pain, frequency, hematuria and urgency.  Musculoskeletal:  Negative for back pain, falls, joint pain and myalgias.  Skin:  Negative for itching and rash.  Neurological:  Positive for weakness and headaches. Negative for dizziness, tingling, sensory change and loss of consciousness.  Endo/Heme/Allergies:  Negative for environmental allergies. Does not bruise/bleed easily.  Psychiatric/Behavioral:  Negative for depression. The patient is not nervous/anxious and does not have insomnia.      Allergies  Allergen Reactions   Amoxicillin Itching   Atenolol Diarrhea    Other reaction(s): HIVES   Other Nausea And Vomiting    Navy Beans   Oxycodone     Other reaction(s): HIVES   Shellfish Allergy Itching and Swelling    Past Medical History:  Diagnosis Date   Cancer ([place]) 2013   bladder   Diverticulitis    History of chickenpox    History of measles    History of mumps    Hyperlipidemia    Hypertension     Past Surgical History:  Procedure Laterality Date   HERNIA REPAIR  2002,2003   IR IMAGING GUIDED PORT INSERTION  08/25/2022   SIGMOIDOSCOPY  1998   [facility]   TRANSURETHRAL RESECTION OF BLADDER TUMOR  05/11/2012   Dr. Sherral Hammers, [place]  2002   Bilateral  inguinal herniorrhaphies    Social History   Socioeconomic History   Marital status: Married    Spouse name: Anderson Malta   Number of children: 3   Years of education: Not on file   Highest education level: Not on file  Occupational History   Occupation: Truck Geophysicist/field seismologist  Tobacco Use   Smoking status: Former    Packs/day: 2.00    Years: 39.00    Total  pack years: 78.00    Types: Cigarettes    Quit date: 07/14/2011    Years since quitting: 11.1    Passive exposure: Past   Smokeless tobacco: Never   Tobacco comments:    smoked 1 to 1 1/2  ppd for 35 years  Vaping Use   Vaping Use: Never used  Substance and Sexual Activity   Alcohol use: Yes    Comment: drinks 2 beers daily: none since 1 week ago   Drug use: No   Sexual activity: Not on file  Other Topics Concern   Not on file  Social History Narrative   Lives at home with wife , daughter , 3 grand daughters, son & his 2 kids    Social Determinants of Health   Financial Resource Strain: Not on file  Food Insecurity: Not on file  Transportation Needs: Not on file  Physical Activity: Not on file  Stress: Not on file  Social Connections: Not on file  Intimate Partner Violence: Not on file    Family History  Problem Relation Age of Onset   COPD Mother    Diverticulosis Mother    Diverticulosis Father    Diabetes Father    Hypertension Father    Diverticulosis Sister    Stroke Paternal Grandmother      Current Outpatient Medications:    acetaminophen (TYLENOL) 500 MG tablet, Take 500 mg by mouth every 6 (six) hours as needed., Disp: , Rfl:    docusate sodium (COLACE) 250 MG capsule, Take 250 mg by mouth daily., Disp: , Rfl:    losartan-hydrochlorothiazide (HYZAAR) 100-12.5 MG tablet, TAKE 1 TABLET BY MOUTH EVERY DAY, Disp: 90 tablet, Rfl: 0   metoprolol succinate (TOPROL-XL) 25 MG 24 hr tablet, TAKE 1 TABLET (25 MG TOTAL) BY MOUTH DAILY., Disp: 90 tablet, Rfl: 2   Multiple Vitamins-Minerals (OCUVITE ADULT FORMULA PO), Take 1 capsule by mouth., Disp: , Rfl:    multivitamin-lutein (OCUVITE-LUTEIN) CAPS capsule, Take 1 capsule by mouth daily., Disp: , Rfl:    pantoprazole (PROTONIX) 40 MG tablet, TAKE 1 TABLET BY MOUTH EVERY DAY, Disp: 90 tablet, Rfl: 2   tamsulosin (FLOMAX) 0.4 MG CAPS capsule, Take 1 capsule (0.4 mg total) by mouth daily., Disp: 30 capsule, Rfl: 11    aspirin 81 MG tablet, Take 81 mg by mouth daily. (Patient not taking: Reported on 08/19/2022), Disp: , Rfl:    fentaNYL (DURAGESIC) 25 MCG/HR, Place 1 patch onto the skin every 3 (three) days. (Patient not taking: Reported on 08/31/2022), Disp: 10 patch, Rfl: 0   HYDROcodone-acetaminophen (NORCO) 5-325 MG tablet, Take 1 tablet by mouth every 6 (six) hours as needed for moderate pain. (Patient not taking: Reported on 08/31/2022), Disp: 120 tablet, Rfl: 0   lidocaine-prilocaine (EMLA) cream, Apply to affected area once (Patient not taking: Reported on 08/31/2022), Disp: 30 g, Rfl: 3   ondansetron (ZOFRAN) 8 MG tablet, Take 1 tablet (8 mg total) by mouth every 8 (eight) hours as needed for nausea or vomiting. (Patient not taking: Reported on 08/31/2022), Disp: 90 tablet, Rfl:  3   ondansetron (ZOFRAN-ODT) 8 MG disintegrating tablet, Take 8 mg by mouth every 8 (eight) hours as needed. (Patient not taking: Reported on 08/31/2022), Disp: , Rfl:    prochlorperazine (COMPAZINE) 10 MG tablet, Take 1 tablet (10 mg total) by mouth every 6 (six) hours as needed (Nausea or vomiting). (Patient not taking: Reported on 08/31/2022), Disp: 60 tablet, Rfl: 2   sildenafil (REVATIO) 20 MG tablet, Take 1-3 tablets (20-60 mg total) by mouth daily as needed. (Patient not taking: Reported on 08/12/2022), Disp: 30 tablet, Rfl: 11   Specialty Vitamins Products ([employer] PROSTATE HEALTH FORMULA PO), , Disp: , Rfl:  No current facility-administered medications for this visit.  Facility-Administered Medications Ordered in Other Visits:    0.9 %  sodium chloride infusion, , Intravenous, Continuous, Verlon Au, NP, Last Rate: 250 mL/hr at 08/31/22 1236, New Bag at 08/31/22 1236   heparin lock flush 100 unit/mL, 500 Units, Intracatheter, Once, Verlon Au, NP  Physical exam:  Vitals:   08/31/22 1000  BP: 106/83  Pulse: (!) 102  Resp: 18  Temp: 98.6 F (37 C)  TempSrc: Tympanic  SpO2: 100%   Physical Exam Constitutional:       General: He is not in acute distress.    Appearance: He is ill-appearing.  HENT:     Head: Normocephalic and atraumatic.     Nose: Nose normal.     Mouth/Throat:     Pharynx: No oropharyngeal exudate.  Eyes:     General: No scleral icterus.    Conjunctiva/sclera: Conjunctivae normal.  Cardiovascular:     Rate and Rhythm: Normal rate and regular rhythm.  Pulmonary:     Effort: Pulmonary effort is normal.     Breath sounds: Normal breath sounds.  Abdominal:     General: There is no distension.     Palpations: Abdomen is soft.     Tenderness: There is no abdominal tenderness.  Musculoskeletal:        General: No swelling. Normal range of motion.  Skin:    General: Skin is warm and dry.     Coloration: Skin is not pale.  Neurological:     Mental Status: He is alert and oriented to person, place, and time.  Psychiatric:        Behavior: Behavior normal.         Latest Ref Rng & Units 08/31/2022    9:51 AM  CMP  Glucose 70 - 99 mg/dL 109   BUN 8 - 23 mg/dL 29   Creatinine 0.61 - 1.24 mg/dL 1.45   Sodium 135 - 145 mmol/L 126   Potassium 3.5 - 5.1 mmol/L 3.7   Chloride 98 - 111 mmol/L 85   CO2 22 - 32 mmol/L 28   Calcium 8.9 - 10.3 mg/dL 9.8   Total Protein 6.5 - 8.1 g/dL 8.2   Total Bilirubin 0.3 - 1.2 mg/dL 1.2   Alkaline Phos 38 - 126 U/L 176   AST 15 - 41 U/L 46   ALT 0 - 44 U/L 89       Latest Ref Rng & Units 08/31/2022    9:51 AM  CBC  WBC 4.0 - 10.5 K/uL 17.8   Hemoglobin 13.0 - 17.0 g/dL 15.0   Hematocrit 39.0 - 52.0 % 43.7   Platelets 150 - 400 K/uL 404    IR IMAGING GUIDED PORT INSERTION  Result Date: 08/25/2022 INDICATION: Chemotherapy EXAM: Chest port placement using ultrasound and fluoroscopic guidance MEDICATIONS: Documented in  the EMR ANESTHESIA/SEDATION: Moderate (conscious) sedation was employed during this procedure. A total of Versed 2 mg and Fentanyl 100 mcg was administered intravenously. Moderate Sedation Time: 28 minutes. The patient's level of  consciousness and vital signs were monitored continuously by radiology nursing throughout the procedure under my direct supervision. FLUOROSCOPY TIME:  Fluoroscopy Time: 0.4 minutes (1 mGy) COMPLICATIONS: None immediate. PROCEDURE: Informed written consent was obtained from the patient after a thorough discussion of the procedural risks, benefits and alternatives. All questions were addressed. Maximal Sterile Barrier Technique was utilized including caps, mask, sterile gowns, sterile gloves, sterile drape, hand hygiene and skin antiseptic. A timeout was performed prior to the initiation of the procedure. The patient was placed supine on the exam table. The right neck and chest was prepped and draped in the standard sterile fashion. A preliminary ultrasound of the right neck was performed and demonstrates a patent right internal jugular vein. A permanent ultrasound image was stored in the electronic medical record. The overlying skin was anesthetized with 1% Lidocaine. Using ultrasound guidance, access was obtained into the right internal jugular vein using a 21 gauge micropuncture set. A wire was advanced into the SVC, a short incision was made at the puncture site, and serial dilatation performed. Next, in an ipsilateral infraclavicular location, an incision was made at the site of the subcutaneous reservoir. Blunt dissection was used to open a pocket to contain the reservoir. A subcutaneous tunnel was then created from the port site to the puncture site. A(n) 8 Fr single lumen catheter was advanced through the tunnel. The catheter was attached to the port and this was placed in the subcutaneous pocket. Under fluoroscopic guidance, a peel away sheath was placed, and the catheter was trimmed to the appropriate length and was advanced into the central veins. The catheter length is 22 cm. The tip of the catheter lies near the superior cavoatrial junction. The port flushes and aspirates appropriately. The port was  flushed and locked with heparinized saline. The port pocket was closed in 2 layers using 3-0 and 4-0 Vicryl/absorbable suture. Dermabond was also applied to both incisions. The patient tolerated the procedure well and was transferred to recovery in stable condition. IMPRESSION: Successful placement of a right-sided chest port via the right internal jugular vein. The port is ready for use. Electronically Signed   By: Albin Felling M.D.   On: 08/25/2022 12:10   NM PET Image Initial (PI) Skull Base To Thigh  Result Date: 08/14/2022 CLINICAL DATA:  Subsequent treatment strategy for metastatic bladder cancer. Retroperitoneal node biopsy 08/12/2022 (pending). EXAM: NUCLEAR MEDICINE PET SKULL BASE TO THIGH TECHNIQUE: 9.72 mCi F-18 FDG was injected intravenously. Full-ring PET imaging was performed from the skull base to thigh after the radiotracer. CT data was obtained and used for attenuation correction and anatomic localization. Fasting blood glucose: 96 mg/dl COMPARISON:  Chest CT 08/05/2022.  Abdominopelvic CT 07/31/2022. FINDINGS: Mediastinal blood pool activity: SUV max 1.3 NECK: No hypermetabolic cervical lymph nodes are identified. No suspicious activity identified within the pharyngeal mucosal space. Incidental CT findings: none CHEST: There are no hypermetabolic mediastinal, hilar or axillary lymph nodes. Best shown on recent CT, there are innumerable pulmonary nodules bilaterally which demonstrate hypermetabolic activity, highly concerning for metastatic disease. The individual nodules are suboptimally assessed due to breathing artifact and misregistration with the PET data. The most intense metabolic activity is in the right perihilar region (SUV max 5.0). A 9 mm right lower lobe nodule on image 112/2 has an SUV  max of 2.8. Additional nodules include a 9 mm left upper lobe nodule on image 104/2 and a left lower lobe nodule measuring 10 mm on image 116/2. Incidental CT findings: Mild aortic and coronary  artery atherosclerosis. ABDOMEN/PELVIS: There is no hypermetabolic activity within the liver, adrenal glands, spleen or pancreas. As shown on recent CT, there is a large centrally necrotic mass involving the posterior aspect of the right kidney, measuring up to 6.0 x 4.2 cm on image 168/2. This demonstrates peripheral hypermetabolic activity and as correlated with recent CT is consistent with renal cell carcinoma. The retroperitoneal adenopathy seen on CT is hypermetabolic. For example, lymph nodes along the right renal vessels measuring up to 4.0 x 2.2 cm on image 170/2 have an SUV max of 9.5. Aortocaval node measuring 3.1 x 1.9 cm on image 187/2 has an SUV max of 8.8, and there is a left periaortic node measuring 11 mm on image 167/2 which has an SUV max of 6.6. No hypermetabolic nodes are identified in the pelvis. No definite abnormality of the bladder or prostate gland. Incidental CT findings: Simple appearing cyst in the upper pole of the left kidney appears stable, without worrisome features. No follow-up imaging recommended. Aortic and branch vessel atherosclerosis. Mild diverticulosis of the descending and sigmoid colon. Prostatomegaly. SKELETON: There is no hypermetabolic activity to suggest osseous metastatic disease. Incidental CT findings: Mild spondylosis. 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 IMPRESSION: 1. Large centrally necrotic right renal mass with peripheral hypermetabolic activity consistent with renal cell carcinoma. 2. Hypermetabolic retroperitoneal adenopathy and innumerable hypermetabolic pulmonary nodules bilaterally consistent with metastatic disease, likely from renal cell carcinoma. 3. No pelvic adenopathy or definite bladder abnormality to suggest bladder cancer. No evidence of osseous metastatic disease. 4. 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 5.  Aortic  Atherosclerosis (ICD10-I70.0). Electronically Signed   By: Richardean Sale M.D.   On: 08/14/2022 14:47   CT ABDOMINAL MASS BIOPSY  Result Date: 08/12/2022 INDICATION: Metastatic retroperitoneal adenopathy, right renal cell carcinoma by imaging EXAM: CT-GUIDED BIOPSY METASTATIC RIGHT RETROPERITONEAL ADENOPATHY MEDICATIONS: 1% LIDOCAINE LOCAL ANESTHESIA/SEDATION: 1.0 mg IV Versed; 50 mcg IV Fentanyl Moderate Sedation Time:  10 MINUTE The patient was continuously monitored during the procedure by the interventional radiology nurse under my direct supervision. PROCEDURE: The procedure, risks, benefits, and alternatives were explained to the patient. Questions regarding the procedure were encouraged and answered. The patient understands and consents to the procedure. Previous imaging reviewed. Patient positioned prone. Noncontrast localization CT performed. The right retroperitoneal adenopathy was localized and marked for a right posterior paraspinous approach. Under sterile conditions and local anesthesia, the 17 gauge 11.8 cm guide was advanced from a right posterior paraspinous approach to the adenopathy. Needle position confirmed with CT. 18 gauge core biopsies obtained. These were placed in formalin. Samples were intact and non fragmented. Needle removed. Postprocedure imaging demonstrates no hemorrhage or hematoma. Patient tolerated the procedure well without complication. Vital sign monitoring by nursing staff during the procedure will continue as patient is in the special procedures unit for post procedure observation. FINDINGS: The images document guide needle placement within the metastatic retroperitoneal adenopathy. Post biopsy images demonstrate no hemorrhage or hematoma. COMPLICATIONS: None immediate. IMPRESSION: Successful CT-guided core biopsy of metastatic retroperitoneal adenopathy. Marland Kitchen RADIATION DOSE REDUCTION: This exam was performed according to the departmental dose-optimization program which  includes automated exposure control, adjustment of the mA and/or kV according to patient  size and/or use of iterative reconstruction technique. Electronically Signed   By: Jerilynn Mages.  Shick M.D.   On: 08/12/2022 11:57   CT CHEST LUNG CA SCREEN LOW DOSE W/O CM  Result Date: 08/06/2022 CLINICAL DATA:  Former smoker with 70 pack-year history EXAM: CT CHEST WITHOUT CONTRAST LOW-DOSE FOR LUNG CANCER SCREENING TECHNIQUE: Multidetector CT imaging of the chest was performed following the standard protocol without IV contrast. RADIATION DOSE REDUCTION: This exam was performed according to the departmental dose-optimization program which includes automated exposure control, adjustment of the mA and/or kV according to patient size and/or use of iterative reconstruction technique. COMPARISON:  Lung cancer screening CT dated August 05, 2021; CT of the abdomen and pelvis dated July 31, 2022 FINDINGS: Cardiovascular: Normal heart size. No pericardial effusion. Normal caliber thoracic aorta with mild calcified plaque. Mediastinum/Nodes: Esophagus and thyroid are unremarkable. No pathologically enlarged lymph nodes seen in the chest. Lungs/Pleura: Central airways are patent. No consolidation, pleural effusion or pneumothorax. Numerous bilateral solid pulmonary nodules nodules which on recent prior CT of the abdomen and pelvis are unchanged when compared with most recent prior lung cancer screening CT nodules are new. Reference nodule of the right lower lobe measuring 12 mm in mean diameter on image 166. Reference nodule of the left lower lobe measuring 11.4 mm in mean diameter on image 179. Upper Abdomen: No acute abnormality. Musculoskeletal: No chest wall mass or suspicious bone lesions identified. IMPRESSION: 1. Numerous solid pulmonary nodules, highly concerning for metastatic disease given presence of renal mass on recent CT of the abdomen and pelvis. Lung-RADS 4X, highly suspicious. Additional imaging evaluation or  consultation with Pulmonology or Thoracic Surgery recommended. 2. Aortic Atherosclerosis (ICD10-I70.0) and Emphysema (ICD10-J43.9). Electronically Signed   By: Yetta Glassman M.D.   On: 08/06/2022 09:13   Assessment and plan- Patient is a 67 y.o. male   Chemotherapy induced nausea- plan for zofran 8 mg IV in clinic. Improved but ongoing symptoms. Plan for compazine IV in clinic with improvement in symptoms. Reviewed use of home antiemetics including ODT zofran. Recommend restarting PPI as well.  Increased serum creatinine- in setting of low sodium and chloride, suspect related to fluid volume deficit. Plan for IV fluids 1500 cc in clinic today.  Hiccups- likely related to nausea. If ongoing symptoms, consider starting thorazine.  Constipation- related to pre-meds. Reviewed that nausea medications may result in constipation particularly in setting of dehydration, low fiber intake, and limited mobility. Hold stool softeners for now but can reconsider if ongoing symptoms.  Malodorous urine- UA not consistent with UTI. Suspect related to chemotherapy excretion and dehydration. Monitor. If culture positive or worsening symptoms, start antibiotics.  Abnormal LFTs- suspect related to chemotherapy. Monitor. Hold acetaminophen.  Leukocytosis- wbc 17.8, ANC also rising.  Afebrile. Suspect related to steroids he received as part of his treatment. Monitor.  Tinnitus- likely secondary to cisplatin. Monitor. May require dose reduction if ongoing symptoms.   Disposition:  Rtc tomorrow for re-evaluation   Visit Diagnosis 1. Convalescence following chemotherapy   2. Chemotherapy-induced nausea   3. Hiccups   4. Malodorous urine   5. Abnormal LFTs   6. Leukocytosis, unspecified type    Patient expressed understanding and was in agreement with this plan. He also understands that He can call clinic at any time with any questions, concerns, or complaints.   Thank you for allowing me to participate in the care  of this very pleasant patient.   Beckey Rutter, DNP, AGNP-C, [place] at [facility]  [phone]  CC: Dr. Grayland Ormond

## 2022-09-01 ENCOUNTER — Inpatient Hospital Stay: Payer: BLUE CROSS/BLUE SHIELD

## 2022-09-01 ENCOUNTER — Encounter: Payer: Self-pay | Admitting: Nurse Practitioner

## 2022-09-01 ENCOUNTER — Inpatient Hospital Stay (HOSPITAL_BASED_OUTPATIENT_CLINIC_OR_DEPARTMENT_OTHER): Payer: BLUE CROSS/BLUE SHIELD | Admitting: Nurse Practitioner

## 2022-09-01 VITALS — Temp 99.1°F | Resp 20

## 2022-09-01 DIAGNOSIS — D72829 Elevated white blood cell count, unspecified: Secondary | ICD-10-CM

## 2022-09-01 DIAGNOSIS — R7989 Other specified abnormal findings of blood chemistry: Secondary | ICD-10-CM | POA: Diagnosis not present

## 2022-09-01 DIAGNOSIS — R066 Hiccough: Secondary | ICD-10-CM | POA: Diagnosis not present

## 2022-09-01 DIAGNOSIS — T451X5A Adverse effect of antineoplastic and immunosuppressive drugs, initial encounter: Secondary | ICD-10-CM

## 2022-09-01 DIAGNOSIS — Z5111 Encounter for antineoplastic chemotherapy: Secondary | ICD-10-CM | POA: Diagnosis not present

## 2022-09-01 DIAGNOSIS — C641 Malignant neoplasm of right kidney, except renal pelvis: Secondary | ICD-10-CM

## 2022-09-01 DIAGNOSIS — R110 Nausea: Secondary | ICD-10-CM

## 2022-09-01 DIAGNOSIS — E860 Dehydration: Secondary | ICD-10-CM

## 2022-09-01 DIAGNOSIS — Z5189 Encounter for other specified aftercare: Secondary | ICD-10-CM

## 2022-09-01 LAB — CBC WITH DIFFERENTIAL (CANCER CENTER ONLY)
Abs Immature Granulocytes: 0.09 10*3/uL — ABNORMAL HIGH (ref 0.00–0.07)
Basophils Absolute: 0 10*3/uL (ref 0.0–0.1)
Basophils Relative: 0 %
Eosinophils Absolute: 0.2 10*3/uL (ref 0.0–0.5)
Eosinophils Relative: 3 %
HCT: 36.6 % — ABNORMAL LOW (ref 39.0–52.0)
Hemoglobin: 12.7 g/dL — ABNORMAL LOW (ref 13.0–17.0)
Immature Granulocytes: 1 %
Lymphocytes Relative: 7 %
Lymphs Abs: 0.7 10*3/uL (ref 0.7–4.0)
MCH: 31 pg (ref 26.0–34.0)
MCHC: 34.7 g/dL (ref 30.0–36.0)
MCV: 89.3 fL (ref 80.0–100.0)
Monocytes Absolute: 0.5 10*3/uL (ref 0.1–1.0)
Monocytes Relative: 5 %
Neutro Abs: 8.2 10*3/uL — ABNORMAL HIGH (ref 1.7–7.7)
Neutrophils Relative %: 84 %
Platelet Count: 307 10*3/uL (ref 150–400)
RBC: 4.1 MIL/uL — ABNORMAL LOW (ref 4.22–5.81)
RDW: 11.6 % (ref 11.5–15.5)
WBC Count: 9.7 10*3/uL (ref 4.0–10.5)
nRBC: 0 % (ref 0.0–0.2)

## 2022-09-01 LAB — CMP (CANCER CENTER ONLY)
ALT: 71 U/L — ABNORMAL HIGH (ref 0–44)
AST: 37 U/L (ref 15–41)
Albumin: 3.2 g/dL — ABNORMAL LOW (ref 3.5–5.0)
Alkaline Phosphatase: 167 U/L — ABNORMAL HIGH (ref 38–126)
Anion gap: 10 (ref 5–15)
BUN: 27 mg/dL — ABNORMAL HIGH (ref 8–23)
CO2: 26 mmol/L (ref 22–32)
Calcium: 9.1 mg/dL (ref 8.9–10.3)
Chloride: 93 mmol/L — ABNORMAL LOW (ref 98–111)
Creatinine: 1.49 mg/dL — ABNORMAL HIGH (ref 0.61–1.24)
GFR, Estimated: 52 mL/min — ABNORMAL LOW (ref >60)
Glucose, Bld: 105 mg/dL — ABNORMAL HIGH (ref 70–99)
Potassium: 3.3 mmol/L — ABNORMAL LOW (ref 3.5–5.1)
Sodium: 129 mmol/L — ABNORMAL LOW (ref 135–145)
Total Bilirubin: 0.9 mg/dL (ref 0.3–1.2)
Total Protein: 7.1 g/dL (ref 6.5–8.1)

## 2022-09-01 LAB — URINE CULTURE: Culture: NO GROWTH

## 2022-09-01 MED ORDER — SODIUM CHLORIDE 0.9% FLUSH
10.0000 mL | Freq: Once | INTRAVENOUS | Status: AC
  Administered 2022-09-01: 10 mL via INTRAVENOUS
  Filled 2022-09-01: qty 10

## 2022-09-01 MED ORDER — CHLORPROMAZINE HCL 10 MG PO TABS: 10.0000 mg | ORAL_TABLET | Freq: Three times a day (TID) | ORAL | 0 refills | Status: DC | PRN

## 2022-09-01 MED ORDER — PROCHLORPERAZINE EDISYLATE 10 MG/2ML IJ SOLN
10.0000 mg | Freq: Once | INTRAMUSCULAR | Status: AC
  Administered 2022-09-01: 10 mg via INTRAVENOUS
  Filled 2022-09-01: qty 2

## 2022-09-01 MED ORDER — SODIUM CHLORIDE 0.9 % IV SOLN
10.0000 mg | Freq: Once | INTRAVENOUS | Status: AC
  Administered 2022-09-01: 10 mg via INTRAVENOUS
  Filled 2022-09-01: qty 10

## 2022-09-01 MED ORDER — POTASSIUM CHLORIDE IN NACL 20-0.9 MEQ/L-% IV SOLN
INTRAVENOUS | Status: DC
  Filled 2022-09-01 (×2): qty 1000

## 2022-09-01 MED ORDER — HEPARIN SOD (PORK) LOCK FLUSH 100 UNIT/ML IV SOLN
500.0000 [IU] | Freq: Once | INTRAVENOUS | Status: AC
  Administered 2022-09-01: 500 [IU] via INTRAVENOUS
  Filled 2022-09-01: qty 5

## 2022-09-01 NOTE — Research (Signed)
DCP-001: Use of a Clinical Trial Screening Tool to Address Cancer Health Disparities in the [facility])   This Coordinator has reviewed this patient's inclusion and exclusion criteria as a second review and confirms Jeffrey Fernandez is eligible for study participation.  Patient may continue with enrollment.   Johny Drilling, [facility] 09/01/2022 9:04 AM

## 2022-09-01 NOTE — Research (Signed)
[id number]: A RANDOMIZED TRIAL ADDRESSING CANCER-RELATED FINANCIAL HARDSHIP THROUGH DELIVERY OF A PROACTIVE FINANCIAL NAVIGATION INTERVENTION (CREDIT)    This Coordinator has reviewed this patient's inclusion and exclusion criteria as a second review and confirms Jeffrey Fernandez is eligible for study participation.  Patient may continue with enrollment.   [place], [facility] 09/01/2022 9:01 AM

## 2022-09-01 NOTE — Progress Notes (Signed)
Symptom Management [place] [facility] [place]. [facility] [address] [place], [place] [address] [phone] (phone) [phone] (fax)  Patient Care Team: Birdie Sons, MD as PCP - General (Family Medicine) Billey Co, MD as Consulting Physician (Urology) Lloyd Huger, MD as Consulting Physician (Oncology)   Name of the patient: Jeffrey Fernandez  [id number]  Aug 16, 1955   Date of visit: 09/01/22  Diagnosis- Urothelial Carcinoma of Kidney  Chief complaint/ Reason for visit- nausea, malaise, hiccups  Heme/Onc history:  Oncology History  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[place])  08/19/2022 Initial Diagnosis   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[place])   08/19/2022 Cancer Staging   Staging form: Kidney, AJCC 8th Edition - Clinical stage from 08/19/2022: Stage IV (cT4, cN1, cM1) - Signed by Lloyd Huger, MD on 08/19/2022   08/27/2022 -  Chemotherapy   Patient is on Treatment Plan : Urothelial carcinoma of kidney: Cisplatin D1 + Gemcitabine D1,8 q21d x 6 Cycles       Interval history-patient 67 year old male who presents to symptom management clinic for complaints of nausea, fatigue and malaise, headache, and hiccups.  He was sseen in Symptom Management last week for similar symptoms. Overnight he took zofran. He generally feels slightly better but still poorly. No more vomiting. Continues to have tinnitus and headache. Urinary symptoms have resolved.   Review of systems- Review of Systems  Constitutional:  Positive for malaise/fatigue. Negative for chills, fever and weight loss.  HENT:  Positive for tinnitus. Negative for hearing loss, nosebleeds and sore throat.   Eyes:  Negative for blurred vision and double vision.  Respiratory:  Positive for cough. Negative for hemoptysis, shortness of breath and wheezing.   Cardiovascular:  Negative for chest pain, palpitations and leg swelling.   Gastrointestinal:  Positive for nausea. Negative for abdominal pain, blood in stool, constipation, diarrhea, melena and vomiting.  Genitourinary:  Negative for dysuria, flank pain, frequency, hematuria and urgency.  Musculoskeletal:  Negative for back pain, falls, joint pain and myalgias.  Skin:  Negative for itching and rash.  Neurological:  Positive for weakness and headaches. Negative for dizziness, tingling, sensory change and loss of consciousness.  Endo/Heme/Allergies:  Negative for environmental allergies. Does not bruise/bleed easily.  Psychiatric/Behavioral:  Negative for depression. The patient is not nervous/anxious and does not have insomnia.      Allergies  Allergen Reactions   Amoxicillin Itching   Atenolol Diarrhea    Other reaction(s): HIVES   Other Nausea And Vomiting    Navy Beans   Oxycodone     Other reaction(s): HIVES   Shellfish Allergy Itching and Swelling    Past Medical History:  Diagnosis Date   Cancer ([place]) 2013   bladder   Diverticulitis    History of chickenpox    History of measles    History of mumps    Hyperlipidemia    Hypertension     Past Surgical History:  Procedure Laterality Date   HERNIA REPAIR  2002,2003   IR IMAGING GUIDED PORT INSERTION  08/25/2022   SIGMOIDOSCOPY  1998   [facility]   TRANSURETHRAL RESECTION OF BLADDER TUMOR  05/11/2012   Dr. Sherral Hammers, [facility]   VASECTOMY  2002   Bilateral inguinal herniorrhaphies    Social History   Socioeconomic History   Marital status: Married    Spouse name: Anderson Malta   Number of children: 3   Years of education: Not on  file   Highest education level: Not on file  Occupational History   Occupation: Truck Geophysicist/field seismologist  Tobacco Use   Smoking status: Former    Packs/day: 2.00    Years: 39.00    Total pack years: 78.00    Types: Cigarettes    Quit date: 07/14/2011    Years since quitting: 11.1    Passive exposure: Past   Smokeless tobacco: Never   Tobacco comments:     smoked 1 to 1 1/2  ppd for 35 years  Vaping Use   Vaping Use: Never used  Substance and Sexual Activity   Alcohol use: Yes    Comment: drinks 2 beers daily: none since 1 week ago   Drug use: No   Sexual activity: Not on file  Other Topics Concern   Not on file  Social History Narrative   Lives at home with wife , daughter , 3 grand daughters, son & his 2 kids    Social Determinants of Health   Financial Resource Strain: Not on file  Food Insecurity: Not on file  Transportation Needs: Not on file  Physical Activity: Not on file  Stress: Not on file  Social Connections: Not on file  Intimate Partner Violence: Not on file    Family History  Problem Relation Age of Onset   COPD Mother    Diverticulosis Mother    Diverticulosis Father    Diabetes Father    Hypertension Father    Diverticulosis Sister    Stroke Paternal Grandmother      Current Outpatient Medications:    acetaminophen (TYLENOL) 500 MG tablet, Take 500 mg by mouth every 6 (six) hours as needed., Disp: , Rfl:    losartan-hydrochlorothiazide (HYZAAR) 100-12.5 MG tablet, TAKE 1 TABLET BY MOUTH EVERY DAY, Disp: 90 tablet, Rfl: 0   metoprolol succinate (TOPROL-XL) 25 MG 24 hr tablet, TAKE 1 TABLET (25 MG TOTAL) BY MOUTH DAILY., Disp: 90 tablet, Rfl: 2   ondansetron (ZOFRAN) 8 MG tablet, Take 1 tablet (8 mg total) by mouth every 8 (eight) hours as needed for nausea or vomiting., Disp: 90 tablet, Rfl: 3   pantoprazole (PROTONIX) 40 MG tablet, TAKE 1 TABLET BY MOUTH EVERY DAY, Disp: 90 tablet, Rfl: 2   aspirin 81 MG tablet, Take 81 mg by mouth daily. (Patient not taking: Reported on 08/19/2022), Disp: , Rfl:    docusate sodium (COLACE) 250 MG capsule, Take 250 mg by mouth daily. (Patient not taking: Reported on 09/01/2022), Disp: , Rfl:    fentaNYL (DURAGESIC) 25 MCG/HR, Place 1 patch onto the skin every 3 (three) days. (Patient not taking: Reported on 08/31/2022), Disp: 10 patch, Rfl: 0   HYDROcodone-acetaminophen (NORCO)  5-325 MG tablet, Take 1 tablet by mouth every 6 (six) hours as needed for moderate pain. (Patient not taking: Reported on 08/31/2022), Disp: 120 tablet, Rfl: 0   lidocaine-prilocaine (EMLA) cream, Apply to affected area once (Patient not taking: Reported on 08/31/2022), Disp: 30 g, Rfl: 3   Multiple Vitamins-Minerals (OCUVITE ADULT FORMULA PO), Take 1 capsule by mouth. (Patient not taking: Reported on 09/01/2022), Disp: , Rfl:    multivitamin-lutein (OCUVITE-LUTEIN) CAPS capsule, Take 1 capsule by mouth daily. (Patient not taking: Reported on 09/01/2022), Disp: , Rfl:    ondansetron (ZOFRAN-ODT) 8 MG disintegrating tablet, Take 8 mg by mouth every 8 (eight) hours as needed. (Patient not taking: Reported on 08/31/2022), Disp: , Rfl:    prochlorperazine (COMPAZINE) 10 MG tablet, Take 1 tablet (10 mg total) by mouth  every 6 (six) hours as needed (Nausea or vomiting). (Patient not taking: Reported on 08/31/2022), Disp: 60 tablet, Rfl: 2   sildenafil (REVATIO) 20 MG tablet, Take 1-3 tablets (20-60 mg total) by mouth daily as needed. (Patient not taking: Reported on 08/12/2022), Disp: 30 tablet, Rfl: 11   Specialty Vitamins Products ([employer] PROSTATE HEALTH FORMULA PO), , Disp: , Rfl:    tamsulosin (FLOMAX) 0.4 MG CAPS capsule, Take 1 capsule (0.4 mg total) by mouth daily. (Patient not taking: Reported on 09/01/2022), Disp: 30 capsule, Rfl: 11 No current facility-administered medications for this visit.  Facility-Administered Medications Ordered in Other Visits:    heparin lock flush 100 unit/mL, 500 Units, Intravenous, Once, Verlon Au, NP  Physical exam:  Vitals:   09/01/22 0903  Resp: 20  Temp: 99.1 F (37.3 C)  TempSrc: Tympanic   Physical Exam Vitals reviewed.  Constitutional:      General: He is not in acute distress.    Appearance: He is not ill-appearing.  HENT:     Head: Normocephalic and atraumatic.     Nose: Nose normal.     Mouth/Throat:     Pharynx: No oropharyngeal exudate.  Eyes:      General: No scleral icterus.    Conjunctiva/sclera: Conjunctivae normal.  Cardiovascular:     Rate and Rhythm: Normal rate and regular rhythm.  Pulmonary:     Effort: Pulmonary effort is normal.     Breath sounds: Normal breath sounds.  Abdominal:     General: There is no distension.     Palpations: Abdomen is soft.     Tenderness: There is no abdominal tenderness.  Skin:    General: Skin is warm and dry.     Coloration: Skin is not pale.  Neurological:     Mental Status: He is alert and oriented to person, place, and time.  Psychiatric:        Behavior: Behavior normal.         Latest Ref Rng & Units 08/31/2022    9:51 AM  CMP  Glucose 70 - 99 mg/dL 109   BUN 8 - 23 mg/dL 29   Creatinine 0.61 - 1.24 mg/dL 1.45   Sodium 135 - 145 mmol/L 126   Potassium 3.5 - 5.1 mmol/L 3.7   Chloride 98 - 111 mmol/L 85   CO2 22 - 32 mmol/L 28   Calcium 8.9 - 10.3 mg/dL 9.8   Total Protein 6.5 - 8.1 g/dL 8.2   Total Bilirubin 0.3 - 1.2 mg/dL 1.2   Alkaline Phos 38 - 126 U/L 176   AST 15 - 41 U/L 46   ALT 0 - 44 U/L 89       Latest Ref Rng & Units 09/01/2022    8:49 AM  CBC  WBC 4.0 - 10.5 K/uL 9.7   Hemoglobin 13.0 - 17.0 g/dL 12.7   Hematocrit 39.0 - 52.0 % 36.6   Platelets 150 - 400 K/uL 307    IR IMAGING GUIDED PORT INSERTION  Result Date: 08/25/2022 INDICATION: Chemotherapy EXAM: Chest port placement using ultrasound and fluoroscopic guidance MEDICATIONS: Documented in the EMR ANESTHESIA/SEDATION: Moderate (conscious) sedation was employed during this procedure. A total of Versed 2 mg and Fentanyl 100 mcg was administered intravenously. Moderate Sedation Time: 28 minutes. The patient's level of consciousness and vital signs were monitored continuously by radiology nursing throughout the procedure under my direct supervision. FLUOROSCOPY TIME:  Fluoroscopy Time: 0.4 minutes (1 mGy) COMPLICATIONS: None immediate. PROCEDURE: Informed written consent  was obtained from the patient  after a thorough discussion of the procedural risks, benefits and alternatives. All questions were addressed. Maximal Sterile Barrier Technique was utilized including caps, mask, sterile gowns, sterile gloves, sterile drape, hand hygiene and skin antiseptic. A timeout was performed prior to the initiation of the procedure. The patient was placed supine on the exam table. The right neck and chest was prepped and draped in the standard sterile fashion. A preliminary ultrasound of the right neck was performed and demonstrates a patent right internal jugular vein. A permanent ultrasound image was stored in the electronic medical record. The overlying skin was anesthetized with 1% Lidocaine. Using ultrasound guidance, access was obtained into the right internal jugular vein using a 21 gauge micropuncture set. A wire was advanced into the SVC, a short incision was made at the puncture site, and serial dilatation performed. Next, in an ipsilateral infraclavicular location, an incision was made at the site of the subcutaneous reservoir. Blunt dissection was used to open a pocket to contain the reservoir. A subcutaneous tunnel was then created from the port site to the puncture site. A(n) 8 Fr single lumen catheter was advanced through the tunnel. The catheter was attached to the port and this was placed in the subcutaneous pocket. Under fluoroscopic guidance, a peel away sheath was placed, and the catheter was trimmed to the appropriate length and was advanced into the central veins. The catheter length is 22 cm. The tip of the catheter lies near the superior cavoatrial junction. The port flushes and aspirates appropriately. The port was flushed and locked with heparinized saline. The port pocket was closed in 2 layers using 3-0 and 4-0 Vicryl/absorbable suture. Dermabond was also applied to both incisions. The patient tolerated the procedure well and was transferred to recovery in stable condition. IMPRESSION: Successful  placement of a right-sided chest port via the right internal jugular vein. The port is ready for use. Electronically Signed   By: Albin Felling M.D.   On: 08/25/2022 12:10   NM PET Image Initial (PI) Skull Base To Thigh  Result Date: 08/14/2022 CLINICAL DATA:  Subsequent treatment strategy for metastatic bladder cancer. Retroperitoneal node biopsy 08/12/2022 (pending). EXAM: NUCLEAR MEDICINE PET SKULL BASE TO THIGH TECHNIQUE: 9.72 mCi F-18 FDG was injected intravenously. Full-ring PET imaging was performed from the skull base to thigh after the radiotracer. CT data was obtained and used for attenuation correction and anatomic localization. Fasting blood glucose: 96 mg/dl COMPARISON:  Chest CT 08/05/2022.  Abdominopelvic CT 07/31/2022. FINDINGS: Mediastinal blood pool activity: SUV max 1.3 NECK: No hypermetabolic cervical lymph nodes are identified. No suspicious activity identified within the pharyngeal mucosal space. Incidental CT findings: none CHEST: There are no hypermetabolic mediastinal, hilar or axillary lymph nodes. Best shown on recent CT, there are innumerable pulmonary nodules bilaterally which demonstrate hypermetabolic activity, highly concerning for metastatic disease. The individual nodules are suboptimally assessed due to breathing artifact and misregistration with the PET data. The most intense metabolic activity is in the right perihilar region (SUV max 5.0). A 9 mm right lower lobe nodule on image 112/2 has an SUV max of 2.8. Additional nodules include a 9 mm left upper lobe nodule on image 104/2 and a left lower lobe nodule measuring 10 mm on image 116/2. Incidental CT findings: Mild aortic and coronary artery atherosclerosis. ABDOMEN/PELVIS: There is no hypermetabolic activity within the liver, adrenal glands, spleen or pancreas. As shown on recent CT, there is a large centrally necrotic mass involving the posterior  aspect of the right kidney, measuring up to 6.0 x 4.2 cm on image 168/2.  This demonstrates peripheral hypermetabolic activity and as correlated with recent CT is consistent with renal cell carcinoma. The retroperitoneal adenopathy seen on CT is hypermetabolic. For example, lymph nodes along the right renal vessels measuring up to 4.0 x 2.2 cm on image 170/2 have an SUV max of 9.5. Aortocaval node measuring 3.1 x 1.9 cm on image 187/2 has an SUV max of 8.8, and there is a left periaortic node measuring 11 mm on image 167/2 which has an SUV max of 6.6. No hypermetabolic nodes are identified in the pelvis. No definite abnormality of the bladder or prostate gland. Incidental CT findings: Simple appearing cyst in the upper pole of the left kidney appears stable, without worrisome features. No follow-up imaging recommended. Aortic and branch vessel atherosclerosis. Mild diverticulosis of the descending and sigmoid colon. Prostatomegaly. SKELETON: There is no hypermetabolic activity to suggest osseous metastatic disease. Incidental CT findings: Mild spondylosis. 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 IMPRESSION: 1. Large centrally necrotic right renal mass with peripheral hypermetabolic activity consistent with renal cell carcinoma. 2. Hypermetabolic retroperitoneal adenopathy and innumerable hypermetabolic pulmonary nodules bilaterally consistent with metastatic disease, likely from renal cell carcinoma. 3. No pelvic adenopathy or definite bladder abnormality to suggest bladder cancer. No evidence of osseous metastatic disease. 4. 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 5.  Aortic Atherosclerosis (ICD10-I70.0). Electronically Signed   By: Richardean Sale M.D.   On: 08/14/2022 14:47   CT ABDOMINAL MASS BIOPSY  Result Date: 08/12/2022 INDICATION: Metastatic retroperitoneal adenopathy, right renal cell carcinoma by imaging EXAM: CT-GUIDED BIOPSY METASTATIC RIGHT  RETROPERITONEAL ADENOPATHY MEDICATIONS: 1% LIDOCAINE LOCAL ANESTHESIA/SEDATION: 1.0 mg IV Versed; 50 mcg IV Fentanyl Moderate Sedation Time:  10 MINUTE The patient was continuously monitored during the procedure by the interventional radiology nurse under my direct supervision. PROCEDURE: The procedure, risks, benefits, and alternatives were explained to the patient. Questions regarding the procedure were encouraged and answered. The patient understands and consents to the procedure. Previous imaging reviewed. Patient positioned prone. Noncontrast localization CT performed. The right retroperitoneal adenopathy was localized and marked for a right posterior paraspinous approach. Under sterile conditions and local anesthesia, the 17 gauge 11.8 cm guide was advanced from a right posterior paraspinous approach to the adenopathy. Needle position confirmed with CT. 18 gauge core biopsies obtained. These were placed in formalin. Samples were intact and non fragmented. Needle removed. Postprocedure imaging demonstrates no hemorrhage or hematoma. Patient tolerated the procedure well without complication. Vital sign monitoring by nursing staff during the procedure will continue as patient is in the special procedures unit for post procedure observation. FINDINGS: The images document guide needle placement within the metastatic retroperitoneal adenopathy. Post biopsy images demonstrate no hemorrhage or hematoma. COMPLICATIONS: None immediate. IMPRESSION: Successful CT-guided core biopsy of metastatic retroperitoneal adenopathy. Marland Kitchen RADIATION DOSE REDUCTION: This exam was performed according to the departmental dose-optimization program which includes automated exposure control, adjustment of the mA and/or kV according to patient size and/or use of iterative reconstruction technique. Electronically Signed   By: Jerilynn Mages.  Shick M.D.   On: 08/12/2022 11:57   CT CHEST LUNG CA SCREEN LOW DOSE W/O CM  Result Date: 08/06/2022 CLINICAL  DATA:  Former smoker with 70 pack-year history EXAM: CT CHEST WITHOUT CONTRAST LOW-DOSE FOR LUNG CANCER SCREENING TECHNIQUE: Multidetector CT imaging of the chest was performed following the  standard protocol without IV contrast. RADIATION DOSE REDUCTION: This exam was performed according to the departmental dose-optimization program which includes automated exposure control, adjustment of the mA and/or kV according to patient size and/or use of iterative reconstruction technique. COMPARISON:  Lung cancer screening CT dated August 05, 2021; CT of the abdomen and pelvis dated July 31, 2022 FINDINGS: Cardiovascular: Normal heart size. No pericardial effusion. Normal caliber thoracic aorta with mild calcified plaque. Mediastinum/Nodes: Esophagus and thyroid are unremarkable. No pathologically enlarged lymph nodes seen in the chest. Lungs/Pleura: Central airways are patent. No consolidation, pleural effusion or pneumothorax. Numerous bilateral solid pulmonary nodules nodules which on recent prior CT of the abdomen and pelvis are unchanged when compared with most recent prior lung cancer screening CT nodules are new. Reference nodule of the right lower lobe measuring 12 mm in mean diameter on image 166. Reference nodule of the left lower lobe measuring 11.4 mm in mean diameter on image 179. Upper Abdomen: No acute abnormality. Musculoskeletal: No chest wall mass or suspicious bone lesions identified. IMPRESSION: 1. Numerous solid pulmonary nodules, highly concerning for metastatic disease given presence of renal mass on recent CT of the abdomen and pelvis. Lung-RADS 4X, highly suspicious. Additional imaging evaluation or consultation with Pulmonology or Thoracic Surgery recommended. 2. Aortic Atherosclerosis (ICD10-I70.0) and Emphysema (ICD10-J43.9). Electronically Signed   By: Yetta Glassman M.D.   On: 08/06/2022 09:13   Assessment and plan- Patient is a 67 y.o. male currently undergoing chemotherapy with  cisplatin and gemcitabine for urothelial carcinoma of the kidney, s/p C1D1 who returns to symptom management clinic for follow up of malaise, nausea, and hiccups.   Chemotherapy induced nausea- s/p zofran iv in clinic yesterday. Took zofran at home this morning. He'll receive compazine 10 mg IV today.  Hiccups- thought to be secondary to nausea. Symptoms have persisted despite improved control of nausea. Prescription for thorazine 10 mg TID prn sent to pharmacy. Advised not to take with zofran d/t risk of QT prolongation.  Convalescence following chemotherapy/malaise- proceed with decadron 10 mg IV today Poor oral intake- d/t nausea and fatigue. BUN, chloride, and sodium have improved but persistently elevated. Plan for 1 L NaCl today.  Hypokalemia- d/t poor intak. Plan for 20 meq KCl IV today Malodorous urine- resolved with hydration. UA unremarkable. Culture pending.  Abnormal LFTs- improving, likely related to chemotherpay Constipation- likely related to pre-meds. Resolved.  Leukocytosis- Improved. Likely related to steroids he received as pre-meds Tinnitus- likely secondary to cisplatin. May reuire dose reduction but will defer to Dr. Grayland Ormond Disposition:  Fluid clinic as planned today Follow up with Dr. Grayland Ormond as scheduled for consideration of C1D8- gemcitabine- la    Visit Diagnosis 1. Convalescence following chemotherapy   2. Chemotherapy-induced nausea   3. Hiccups   4. Abnormal LFTs   5. Leukocytosis, unspecified type   6. Dehydration    Patient expressed understanding and was in agreement with this plan. He also understands that He can call clinic at any time with any questions, concerns, or complaints.   Thank you for allowing me to participate in the care of this very pleasant patient.   Beckey Rutter, DNP, AGNP-C, [place] [facility] [place]  CC: Dr. Grayland Ormond

## 2022-09-02 ENCOUNTER — Encounter: Payer: Self-pay | Admitting: *Deleted

## 2022-09-02 ENCOUNTER — Other Ambulatory Visit: Payer: Self-pay | Admitting: *Deleted

## 2022-09-02 DIAGNOSIS — C641 Malignant neoplasm of right kidney, except renal pelvis: Secondary | ICD-10-CM

## 2022-09-02 NOTE — Progress Notes (Signed)
cbc

## 2022-09-03 ENCOUNTER — Inpatient Hospital Stay (HOSPITAL_BASED_OUTPATIENT_CLINIC_OR_DEPARTMENT_OTHER): Payer: BLUE CROSS/BLUE SHIELD | Admitting: Oncology

## 2022-09-03 ENCOUNTER — Inpatient Hospital Stay: Payer: BLUE CROSS/BLUE SHIELD

## 2022-09-03 ENCOUNTER — Encounter: Payer: Self-pay | Admitting: Oncology

## 2022-09-03 DIAGNOSIS — Z5111 Encounter for antineoplastic chemotherapy: Secondary | ICD-10-CM | POA: Diagnosis not present

## 2022-09-03 DIAGNOSIS — C641 Malignant neoplasm of right kidney, except renal pelvis: Secondary | ICD-10-CM | POA: Diagnosis not present

## 2022-09-03 LAB — CBC WITH DIFFERENTIAL/PLATELET
Abs Immature Granulocytes: 0.09 10*3/uL — ABNORMAL HIGH (ref 0.00–0.07)
Basophils Absolute: 0 10*3/uL (ref 0.0–0.1)
Basophils Relative: 0 %
Eosinophils Absolute: 0.4 10*3/uL (ref 0.0–0.5)
Eosinophils Relative: 3 %
HCT: 38.8 % — ABNORMAL LOW (ref 39.0–52.0)
Hemoglobin: 13.3 g/dL (ref 13.0–17.0)
Immature Granulocytes: 1 %
Lymphocytes Relative: 8 %
Lymphs Abs: 1 10*3/uL (ref 0.7–4.0)
MCH: 30.7 pg (ref 26.0–34.0)
MCHC: 34.3 g/dL (ref 30.0–36.0)
MCV: 89.6 fL (ref 80.0–100.0)
Monocytes Absolute: 0.9 10*3/uL (ref 0.1–1.0)
Monocytes Relative: 8 %
Neutro Abs: 9.3 10*3/uL — ABNORMAL HIGH (ref 1.7–7.7)
Neutrophils Relative %: 80 %
Platelets: 200 10*3/uL (ref 150–400)
RBC: 4.33 MIL/uL (ref 4.22–5.81)
RDW: 11.5 % (ref 11.5–15.5)
WBC: 11.7 10*3/uL — ABNORMAL HIGH (ref 4.0–10.5)
nRBC: 0 % (ref 0.0–0.2)

## 2022-09-03 LAB — CMP (CANCER CENTER ONLY)
ALT: 67 U/L — ABNORMAL HIGH (ref 0–44)
AST: 34 U/L (ref 15–41)
Albumin: 3.4 g/dL — ABNORMAL LOW (ref 3.5–5.0)
Alkaline Phosphatase: 173 U/L — ABNORMAL HIGH (ref 38–126)
Anion gap: 10 (ref 5–15)
BUN: 22 mg/dL (ref 8–23)
CO2: 28 mmol/L (ref 22–32)
Calcium: 9.4 mg/dL (ref 8.9–10.3)
Chloride: 91 mmol/L — ABNORMAL LOW (ref 98–111)
Creatinine: 1.4 mg/dL — ABNORMAL HIGH (ref 0.61–1.24)
GFR, Estimated: 56 mL/min — ABNORMAL LOW (ref >60)
Glucose, Bld: 114 mg/dL — ABNORMAL HIGH (ref 70–99)
Potassium: 3.7 mmol/L (ref 3.5–5.1)
Sodium: 129 mmol/L — ABNORMAL LOW (ref 135–145)
Total Bilirubin: 1.1 mg/dL (ref 0.3–1.2)
Total Protein: 7.5 g/dL (ref 6.5–8.1)

## 2022-09-03 LAB — MAGNESIUM: Magnesium: 2.2 mg/dL (ref 1.7–2.4)

## 2022-09-03 MED ORDER — SODIUM CHLORIDE 0.9 % IV SOLN
1000.0000 mg/m2 | Freq: Once | INTRAVENOUS | Status: AC
  Administered 2022-09-03: 1977 mg via INTRAVENOUS
  Filled 2022-09-03: qty 52

## 2022-09-03 MED ORDER — SODIUM CHLORIDE 0.9 % IV SOLN
Freq: Once | INTRAVENOUS | Status: AC
  Filled 2022-09-03: qty 250

## 2022-09-03 MED ORDER — HEPARIN SOD (PORK) LOCK FLUSH 100 UNIT/ML IV SOLN
500.0000 [IU] | Freq: Once | INTRAVENOUS | Status: AC | PRN
  Administered 2022-09-03: 500 [IU]
  Filled 2022-09-03: qty 5

## 2022-09-03 MED ORDER — PROCHLORPERAZINE MALEATE 10 MG PO TABS
10.0000 mg | ORAL_TABLET | Freq: Once | ORAL | Status: AC
  Administered 2022-09-03: 10 mg via ORAL
  Filled 2022-09-03: qty 1

## 2022-09-03 NOTE — Progress Notes (Signed)
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  INTERVAL HISTORY: Patient returns to clinic today for further evaluation and consideration of cycle 1, day 8 of cisplatin and gemcitabine.  Gemcitabine only today.  He had slight difficulty with his first treatment requiring IV fluids several times this past week.  He had increased diarrhea, but this is improved.  He does not complain of tinnitus today. His pain is better controlled.  He has no neurologic complaints.  He denies any recent fevers or illnesses.  He has no chest pain, shortness of breath, cough, or hemoptysis.  He denies any nausea, vomiting, or constipation.  He has no urinary complaints.  Patient offers no further specific complaints today.  REVIEW OF SYSTEMS:   Review of Systems  Constitutional:  Positive for malaise/fatigue. Negative for fever and weight loss.  Respiratory: Negative.  Negative for cough, hemoptysis and shortness of breath.   Cardiovascular: Negative.  Negative for chest pain and leg swelling.  Gastrointestinal:  Positive for diarrhea and nausea. Negative for abdominal pain.  Genitourinary:  Positive for flank pain. Negative for hematuria.  Musculoskeletal:  Negative for back pain.  Skin: Negative.  Negative for rash.  Neurological:  Positive for weakness. Negative for dizziness, focal weakness and headaches.  Psychiatric/Behavioral: Negative.  The patient is not nervous/anxious.     As per HPI. Otherwise, a complete review of systems is negative.  PAST MEDICAL HISTORY: Past Medical History:  Diagnosis Date   Cancer ([place]) 2013   bladder    Diverticulitis    History of chickenpox    History of measles    History of mumps    Hyperlipidemia    Hypertension     PAST SURGICAL HISTORY: Past Surgical History:  Procedure Laterality Date   HERNIA REPAIR  2002,2003   IR IMAGING GUIDED PORT INSERTION  08/25/2022   SIGMOIDOSCOPY  1998   [facility]   TRANSURETHRAL RESECTION OF BLADDER TUMOR  05/11/2012   Dr. Sherral Hammers, [place]  2002   Bilateral inguinal herniorrhaphies    FAMILY HISTORY: Family History  Problem Relation Age of Onset   COPD Mother    Diverticulosis Mother    Diverticulosis Father    Diabetes Father    Hypertension Father    Diverticulosis Sister    Stroke Paternal Grandmother     ADVANCED DIRECTIVES (Y/N):  N  HEALTH MAINTENANCE: Social History   Tobacco Use   Smoking status: Former    Packs/day: 2.00    Years: 39.00    Total pack years: 78.00    Types: Cigarettes    Quit date: 07/14/2011    Years since quitting: 11.1    Passive exposure: Past   Smokeless tobacco: Never   Tobacco comments:    smoked 1 to 1 1/2  ppd for 35 years  Vaping Use   Vaping Use: Never used  Substance Use Topics   Alcohol use: Yes    Comment: drinks 2 beers daily: none since 1 week ago   Drug use: No     Colonoscopy:  PAP:  Bone density:  Lipid panel:  Allergies  Allergen Reactions   Amoxicillin Itching   Atenolol Diarrhea  Other reaction(s): HIVES   Other Nausea And Vomiting    [employer]   Oxycodone     Other reaction(s): HIVES   Shellfish Allergy Itching and Swelling    Current Outpatient Medications  Medication Sig Dispense Refill   losartan-hydrochlorothiazide (HYZAAR) 100-12.5 MG tablet TAKE 1 TABLET BY MOUTH EVERY DAY 90 tablet 0   metoprolol succinate (TOPROL-XL) 25 MG 24 hr tablet TAKE 1 TABLET (25 MG TOTAL) BY MOUTH DAILY. 90 tablet 2   ondansetron (ZOFRAN) 8 MG tablet Take 1 tablet (8 mg total) by mouth every 8 (eight) hours as needed for nausea or vomiting. 90  tablet 3   pantoprazole (PROTONIX) 40 MG tablet TAKE 1 TABLET BY MOUTH EVERY DAY 90 tablet 2   acetaminophen (TYLENOL) 500 MG tablet Take 500 mg by mouth every 6 (six) hours as needed. (Patient not taking: Reported on 09/03/2022)     aspirin 81 MG tablet Take 81 mg by mouth daily. (Patient not taking: Reported on 08/19/2022)     chlorproMAZINE (THORAZINE) 10 MG tablet Take 1 tablet (10 mg total) by mouth 3 (three) times daily as needed for hiccoughs or nausea. Do not take when taking Zofran/ondansetron. (Patient not taking: Reported on 09/03/2022) 30 tablet 0   docusate sodium (COLACE) 250 MG capsule Take 250 mg by mouth daily. (Patient not taking: Reported on 09/01/2022)     fentaNYL (DURAGESIC) 25 MCG/HR Place 1 patch onto the skin every 3 (three) days. (Patient not taking: Reported on 08/31/2022) 10 patch 0   HYDROcodone-acetaminophen (NORCO) 5-325 MG tablet Take 1 tablet by mouth every 6 (six) hours as needed for moderate pain. (Patient not taking: Reported on 08/31/2022) 120 tablet 0   lidocaine-prilocaine (EMLA) cream Apply to affected area once (Patient not taking: Reported on 08/31/2022) 30 g 3   Multiple Vitamins-Minerals (OCUVITE ADULT FORMULA PO) Take 1 capsule by mouth. (Patient not taking: Reported on 09/01/2022)     multivitamin-lutein (OCUVITE-LUTEIN) CAPS capsule Take 1 capsule by mouth daily. (Patient not taking: Reported on 09/01/2022)     ondansetron (ZOFRAN-ODT) 8 MG disintegrating tablet Take 8 mg by mouth every 8 (eight) hours as needed. (Patient not taking: Reported on 08/31/2022)     prochlorperazine (COMPAZINE) 10 MG tablet Take 1 tablet (10 mg total) by mouth every 6 (six) hours as needed (Nausea or vomiting). (Patient not taking: Reported on 08/31/2022) 60 tablet 2   sildenafil (REVATIO) 20 MG tablet Take 1-3 tablets (20-60 mg total) by mouth daily as needed. (Patient not taking: Reported on 08/12/2022) 30 tablet 11   Specialty Vitamins Products ([employer] PROSTATE HEALTH FORMULA PO)  (Patient  not taking: Reported on 08/12/2022)     tamsulosin (FLOMAX) 0.4 MG CAPS capsule Take 1 capsule (0.4 mg total) by mouth daily. (Patient not taking: Reported on 09/01/2022) 30 capsule 11   No current facility-administered medications for this visit.    OBJECTIVE: Vitals:   09/03/22 0927  BP: 110/85  Pulse: 99  Resp: 16  Temp: 98.2 F (36.8 C)  SpO2: 99%     Body mass index is 25.84 kg/m.    ECOG FS:1 - Symptomatic but completely ambulatory  General: Well-developed, well-nourished, no acute distress. Eyes: Pink conjunctiva, anicteric sclera. HEENT: Normocephalic, moist mucous membranes. Lungs: No audible wheezing or coughing. Heart: Regular rate and rhythm. Abdomen: Soft, nontender, no obvious distention. Musculoskeletal: No edema, cyanosis, or clubbing. Neuro: Alert, answering all questions appropriately. Cranial nerves grossly intact. Skin: No rashes or petechiae noted. Psych: Normal affect.  LAB  RESULTS:  Lab Results  Component Value Date   NA 129 (L) 09/03/2022   K 3.7 09/03/2022   CL 91 (L) 09/03/2022   CO2 28 09/03/2022   GLUCOSE 114 (H) 09/03/2022   BUN 22 09/03/2022   CREATININE 1.40 (H) 09/03/2022   CALCIUM 9.4 09/03/2022   PROT 7.5 09/03/2022   ALBUMIN 3.4 (L) 09/03/2022   AST 34 09/03/2022   ALT 67 (H) 09/03/2022   ALKPHOS 173 (H) 09/03/2022   BILITOT 1.1 09/03/2022   GFRNONAA 56 (L) 09/03/2022   GFRAA 90 06/10/2020    Lab Results  Component Value Date   WBC 11.7 (H) 09/03/2022   NEUTROABS 9.3 (H) 09/03/2022   HGB 13.3 09/03/2022   HCT 38.8 (L) 09/03/2022   MCV 89.6 09/03/2022   PLT 200 09/03/2022     STUDIES: IR IMAGING GUIDED PORT INSERTION  Result Date: 08/25/2022 INDICATION: Chemotherapy EXAM: Chest port placement using ultrasound and fluoroscopic guidance MEDICATIONS: Documented in the EMR ANESTHESIA/SEDATION: Moderate (conscious) sedation was employed during this procedure. A total of Versed 2 mg and Fentanyl 100 mcg was administered  intravenously. Moderate Sedation Time: 28 minutes. The patient's level of consciousness and vital signs were monitored continuously by radiology nursing throughout the procedure under my direct supervision. FLUOROSCOPY TIME:  Fluoroscopy Time: 0.4 minutes (1 mGy) COMPLICATIONS: None immediate. PROCEDURE: Informed written consent was obtained from the patient after a thorough discussion of the procedural risks, benefits and alternatives. All questions were addressed. Maximal Sterile Barrier Technique was utilized including caps, mask, sterile gowns, sterile gloves, sterile drape, hand hygiene and skin antiseptic. A timeout was performed prior to the initiation of the procedure. The patient was placed supine on the exam table. The right neck and chest was prepped and draped in the standard sterile fashion. A preliminary ultrasound of the right neck was performed and demonstrates a patent right internal jugular vein. A permanent ultrasound image was stored in the electronic medical record. The overlying skin was anesthetized with 1% Lidocaine. Using ultrasound guidance, access was obtained into the right internal jugular vein using a 21 gauge micropuncture set. A wire was advanced into the SVC, a short incision was made at the puncture site, and serial dilatation performed. Next, in an ipsilateral infraclavicular location, an incision was made at the site of the subcutaneous reservoir. Blunt dissection was used to open a pocket to contain the reservoir. A subcutaneous tunnel was then created from the port site to the puncture site. A(n) 8 Fr single lumen catheter was advanced through the tunnel. The catheter was attached to the port and this was placed in the subcutaneous pocket. Under fluoroscopic guidance, a peel away sheath was placed, and the catheter was trimmed to the appropriate length and was advanced into the central veins. The catheter length is 22 cm. The tip of the catheter lies near the superior cavoatrial  junction. The port flushes and aspirates appropriately. The port was flushed and locked with heparinized saline. The port pocket was closed in 2 layers using 3-0 and 4-0 Vicryl/absorbable suture. Dermabond was also applied to both incisions. The patient tolerated the procedure well and was transferred to recovery in stable condition. IMPRESSION: Successful placement of a right-sided chest port via the right internal jugular vein. The port is ready for use. Electronically Signed   By: Albin Felling M.D.   On: 08/25/2022 12:10   NM PET Image Initial (PI) Skull Base To Thigh  Result Date: 08/14/2022 CLINICAL DATA:  Subsequent treatment strategy for metastatic  bladder cancer. Retroperitoneal node biopsy 08/12/2022 (pending). EXAM: NUCLEAR MEDICINE PET SKULL BASE TO THIGH TECHNIQUE: 9.72 mCi F-18 FDG was injected intravenously. Full-ring PET imaging was performed from the skull base to thigh after the radiotracer. CT data was obtained and used for attenuation correction and anatomic localization. Fasting blood glucose: 96 mg/dl COMPARISON:  Chest CT 08/05/2022.  Abdominopelvic CT 07/31/2022. FINDINGS: Mediastinal blood pool activity: SUV max 1.3 NECK: No hypermetabolic cervical lymph nodes are identified. No suspicious activity identified within the pharyngeal mucosal space. Incidental CT findings: none CHEST: There are no hypermetabolic mediastinal, hilar or axillary lymph nodes. Best shown on recent CT, there are innumerable pulmonary nodules bilaterally which demonstrate hypermetabolic activity, highly concerning for metastatic disease. The individual nodules are suboptimally assessed due to breathing artifact and misregistration with the PET data. The most intense metabolic activity is in the right perihilar region (SUV max 5.0). A 9 mm right lower lobe nodule on image 112/2 has an SUV max of 2.8. Additional nodules include a 9 mm left upper lobe nodule on image 104/2 and a left lower lobe nodule measuring 10 mm  on image 116/2. Incidental CT findings: Mild aortic and coronary artery atherosclerosis. ABDOMEN/PELVIS: There is no hypermetabolic activity within the liver, adrenal glands, spleen or pancreas. As shown on recent CT, there is a large centrally necrotic mass involving the posterior aspect of the right kidney, measuring up to 6.0 x 4.2 cm on image 168/2. This demonstrates peripheral hypermetabolic activity and as correlated with recent CT is consistent with renal cell carcinoma. The retroperitoneal adenopathy seen on CT is hypermetabolic. For example, lymph nodes along the right renal vessels measuring up to 4.0 x 2.2 cm on image 170/2 have an SUV max of 9.5. Aortocaval node measuring 3.1 x 1.9 cm on image 187/2 has an SUV max of 8.8, and there is a left periaortic node measuring 11 mm on image 167/2 which has an SUV max of 6.6. No hypermetabolic nodes are identified in the pelvis. No definite abnormality of the bladder or prostate gland. Incidental CT findings: Simple appearing cyst in the upper pole of the left kidney appears stable, without worrisome features. No follow-up imaging recommended. Aortic and branch vessel atherosclerosis. Mild diverticulosis of the descending and sigmoid colon. Prostatomegaly. SKELETON: There is no hypermetabolic activity to suggest osseous metastatic disease. Incidental CT findings: Mild spondylosis. 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 IMPRESSION: 1. Large centrally necrotic right renal mass with peripheral hypermetabolic activity consistent with renal cell carcinoma. 2. Hypermetabolic retroperitoneal adenopathy and innumerable hypermetabolic pulmonary nodules bilaterally consistent with metastatic disease, likely from renal cell carcinoma. 3. No pelvic adenopathy or definite bladder abnormality to suggest bladder cancer. No evidence of osseous metastatic disease. 4. Chronic bilateral L5 pars defects with grade 1  anterolisthesis and biforaminal narrowing at L5-S1. 5.  Aortic Atherosclerosis (ICD10-I70.0). Electronically Signed   By: Richardean Sale M.D.   On: 08/14/2022 14:47   CT ABDOMINAL MASS BIOPSY  Result Date: 08/12/2022 INDICATION: Metastatic retroperitoneal adenopathy, right renal cell carcinoma by imaging EXAM: CT-GUIDED BIOPSY METASTATIC RIGHT RETROPERITONEAL ADENOPATHY MEDICATIONS: 1% LIDOCAINE LOCAL ANESTHESIA/SEDATION: 1.0 mg IV Versed; 50 mcg IV Fentanyl Moderate Sedation Time:  10 MINUTE The patient was continuously monitored during the procedure by the interventional radiology nurse under my direct supervision. PROCEDURE: The procedure, risks, benefits, and alternatives were explained to the patient. Questions regarding the procedure were encouraged and answered. The patient understands and consents to the procedure. Previous imaging  reviewed. Patient positioned prone. Noncontrast localization CT performed. The right retroperitoneal adenopathy was localized and marked for a right posterior paraspinous approach. Under sterile conditions and local anesthesia, the 17 gauge 11.8 cm guide was advanced from a right posterior paraspinous approach to the adenopathy. Needle position confirmed with CT. 18 gauge core biopsies obtained. These were placed in formalin. Samples were intact and non fragmented. Needle removed. Postprocedure imaging demonstrates no hemorrhage or hematoma. Patient tolerated the procedure well without complication. Vital sign monitoring by nursing staff during the procedure will continue as patient is in the special procedures unit for post procedure observation. FINDINGS: The images document guide needle placement within the metastatic retroperitoneal adenopathy. Post biopsy images demonstrate no hemorrhage or hematoma. COMPLICATIONS: None immediate. IMPRESSION: Successful CT-guided core biopsy of metastatic retroperitoneal adenopathy. Marland Kitchen RADIATION DOSE REDUCTION: This exam was performed  according to the departmental dose-optimization program which includes automated exposure control, adjustment of the mA and/or kV according to patient size and/or use of iterative reconstruction technique. Electronically Signed   By: Jerilynn Mages.  Shick M.D.   On: 08/12/2022 11:57   CT CHEST LUNG CA SCREEN LOW DOSE W/O CM  Result Date: 08/06/2022 CLINICAL DATA:  Former smoker with 70 pack-year history EXAM: CT CHEST WITHOUT CONTRAST LOW-DOSE FOR LUNG CANCER SCREENING TECHNIQUE: Multidetector CT imaging of the chest was performed following the standard protocol without IV contrast. RADIATION DOSE REDUCTION: This exam was performed according to the departmental dose-optimization program which includes automated exposure control, adjustment of the mA and/or kV according to patient size and/or use of iterative reconstruction technique. COMPARISON:  Lung cancer screening CT dated August 05, 2021; CT of the abdomen and pelvis dated July 31, 2022 FINDINGS: Cardiovascular: Normal heart size. No pericardial effusion. Normal caliber thoracic aorta with mild calcified plaque. Mediastinum/Nodes: Esophagus and thyroid are unremarkable. No pathologically enlarged lymph nodes seen in the chest. Lungs/Pleura: Central airways are patent. No consolidation, pleural effusion or pneumothorax. Numerous bilateral solid pulmonary nodules nodules which on recent prior CT of the abdomen and pelvis are unchanged when compared with most recent prior lung cancer screening CT nodules are new. Reference nodule of the right lower lobe measuring 12 mm in mean diameter on image 166. Reference nodule of the left lower lobe measuring 11.4 mm in mean diameter on image 179. Upper Abdomen: No acute abnormality. Musculoskeletal: No chest wall mass or suspicious bone lesions identified. IMPRESSION: 1. Numerous solid pulmonary nodules, highly concerning for metastatic disease given presence of renal mass on recent CT of the abdomen and pelvis. Lung-RADS 4X,  highly suspicious. Additional imaging evaluation or consultation with Pulmonology or Thoracic Surgery recommended. 2. Aortic Atherosclerosis (ICD10-I70.0) and Emphysema (ICD10-J43.9). Electronically Signed   By: Yetta Glassman M.D.   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  This lesion was not evident on CT scan from June 19, 2020.  PET scan results from August 14, 2022 reviewed independently and reported as above with hypermetabolic kidney mass, retroperitoneal lymphadenopathy and multiple bilateral pulmonary nodules consistent with metastatic disease.  Biopsy confirmed diagnosis.  Patient will benefit from systemic chemotherapy using cisplatin and gemcitabine on day 1 with gemcitabine only on day 8.  This will be a 21-day cycle.  Patient has  had port placement.  Proceed with cycle 1, day 8 of treatment today.  Return to clinic in 2 weeks for further evaluation and consideration of cycle 2, day 1.     Pain: Continue fentanyl patch and Norco as needed. Nausea: Continue antiemetics as needed. Diarrhea: Improved.  Patient was offered IV fluids next week, which she declined. Hyponatremia: Chronic and unchanged, monitor. Renal insufficiency: Patient's most recent creatinine is 1.40.  Monitor while giving cisplatin. Leukocytosis: Likely reactive, monitor.   Patient expressed understanding and was in agreement with this plan. He also understands that He can call clinic at any time with any questions, concerns, or complaints.    Cancer Staging  Urothelial carcinoma of kidney [facility]) Staging form: Kidney, AJCC 8th Edition - Clinical stage from 08/19/2022: Stage IV (cT4, cN1, cM1) - Signed by Lloyd Huger, MD on  08/19/2022   Lloyd Huger, MD   09/03/2022 4:28 PM

## 2022-09-03 NOTE — Patient Instructions (Signed)
[facility]  Discharge Instructions: Thank you for choosing [facility] to provide your oncology and hematology care.  If you have a lab appointment with the [facility], please go directly to the [facility] and check in at the registration area.  Wear comfortable clothing and clothing appropriate for easy access to any Portacath or PICC line.   We strive to give you quality time with your provider. You may need to reschedule your appointment if you arrive late (15 or more minutes).  Arriving late affects you and other patients whose appointments are after yours.  Also, if you miss three or more appointments without notifying the office, you may be dismissed from the clinic at the provider's discretion.      For prescription refill requests, have your pharmacy contact our office and allow 72 hours for refills to be completed.    Today you received the following chemotherapy and/or immunotherapy agents GEMZAR      To help prevent nausea and vomiting after your treatment, we encourage you to take your nausea medication as directed.  BELOW ARE SYMPTOMS THAT SHOULD BE REPORTED IMMEDIATELY: *FEVER GREATER THAN 100.4 F (38 C) OR HIGHER *CHILLS OR SWEATING *NAUSEA AND VOMITING THAT IS NOT CONTROLLED WITH YOUR NAUSEA MEDICATION *UNUSUAL SHORTNESS OF BREATH *UNUSUAL BRUISING OR BLEEDING *URINARY PROBLEMS (pain or burning when urinating, or frequent urination) *BOWEL PROBLEMS (unusual diarrhea, constipation, pain near the anus) TENDERNESS IN MOUTH AND THROAT WITH OR WITHOUT PRESENCE OF ULCERS (sore throat, sores in mouth, or a toothache) UNUSUAL RASH, SWELLING OR PAIN  UNUSUAL VAGINAL DISCHARGE OR ITCHING   Items with * indicate a potential emergency and should be followed up as soon as possible or go to the Emergency Department if any problems should occur.  Please show the CHEMOTHERAPY ALERT CARD or IMMUNOTHERAPY ALERT CARD at check-in to the  Emergency Department and triage nurse.  Should you have questions after your visit or need to cancel or reschedule your appointment, please contact [facility]  [phone] and follow the prompts.  Office hours are 8:00 a.m. to 4:30 p.m. Monday - Friday. Please note that voicemails left after 4:00 p.m. may not be returned until the following business day.  We are closed weekends and major holidays. You have access to a nurse at all times for urgent questions. Please call the main number to the clinic [phone] and follow the prompts.  For any non-urgent questions, you may also contact your provider using MyChart. We now offer e-Visits for anyone 44 and older to request care online for non-urgent symptoms. For details visit [email or web address].   Also download the MyChart app! Go to the app store, search "MyChart", open the app, select [facility], and log in with your MyChart username and password.  Gemcitabine Injection What is this medication? GEMCITABINE (jem SYE ta been) treats some types of cancer. It works by slowing down the growth of cancer cells. This medicine may be used for other purposes; ask your health care provider or pharmacist if you have questions. COMMON BRAND NAME(S): Gemzar, Infugem What should I tell my care team before I take this medication? They need to know if you have any of these conditions: Blood disorders Infection Kidney disease Liver disease Lung or breathing disease, such as asthma or COPD Recent or ongoing radiation therapy An unusual or allergic reaction to gemcitabine, other medications, foods, dyes, or preservatives If you or your partner are pregnant  or trying to get pregnant Breast-feeding How should I use this medication? This medication is injected into a vein. It is given by your care team in a hospital or clinic setting. Talk to your care team about the use of this medication in children. Special care may  be needed. Overdosage: If you think you have taken too much of this medicine contact a poison control center or emergency room at once. NOTE: This medicine is only for you. Do not share this medicine with others. What if I miss a dose? Keep appointments for follow-up doses. It is important not to miss your dose. Call your care team if you are unable to keep an appointment. What may interact with this medication? Interactions have not been studied. This list may not describe all possible interactions. Give your health care provider a list of all the medicines, herbs, non-prescription drugs, or dietary supplements you use. Also tell them if you smoke, drink alcohol, or use illegal drugs. Some items may interact with your medicine. What should I watch for while using this medication? Your condition will be monitored carefully while you are receiving this medication. This medication may make you feel generally unwell. This is not uncommon, as chemotherapy can affect healthy cells as well as cancer cells. Report any side effects. Continue your course of treatment even though you feel ill unless your care team tells you to stop. In some cases, you may be given additional medications to help with side effects. Follow all directions for their use. This medication may increase your risk of getting an infection. Call your care team for advice if you get a fever, chills, sore throat, or other symptoms of a cold or flu. Do not treat yourself. Try to avoid being around people who are sick. This medication may increase your risk to bruise or bleed. Call your care team if you notice any unusual bleeding. Be careful brushing or flossing your teeth or using a toothpick because you may get an infection or bleed more easily. If you have any dental work done, tell your dentist you are receiving this medication. Avoid taking medications that contain aspirin, acetaminophen, ibuprofen, naproxen, or ketoprofen unless  instructed by your care team. These medications may hide a fever. Talk to your care team if you or your partner wish to become pregnant or think you might be pregnant. This medication can cause serious birth defects if taken during pregnancy and for 6 months after the last dose. A negative pregnancy test is required before starting this medication. A reliable form of contraception is recommended while taking this medication and for 6 months after the last dose. Talk to your care team about effective forms of contraception. Do not father a child while taking this medication and for 3 months after the last dose. Use a condom while having sex during this time period. Do not breastfeed while taking this medication and for at least 1 week after the last dose. This medication may cause infertility. Talk to your care team if you are concerned about your fertility. What side effects may I notice from receiving this medication? Side effects that you should report to your care team as soon as possible: Allergic reactions--skin rash, itching, hives, swelling of the face, lips, tongue, or throat Capillary leak syndrome--stomach or muscle pain, unusual weakness or fatigue, feeling faint or lightheaded, decrease in the amount of urine, swelling of the ankles, hands, or feet, trouble breathing Infection--fever, chills, cough, sore throat, wounds that don't heal,  pain or trouble when passing urine, general feeling of discomfort or being unwell Liver injury--right upper belly pain, loss of appetite, nausea, light-colored stool, dark yellow or brown urine, yellowing skin or eyes, unusual weakness or fatigue Low red blood cell level--unusual weakness or fatigue, dizziness, headache, trouble breathing Lung injury--shortness of breath or trouble breathing, cough, spitting up blood, chest pain, fever Stomach pain, bloody diarrhea, pale skin, unusual weakness or fatigue, decrease in the amount of urine, which may be signs of  hemolytic uremic syndrome Sudden and severe headache, confusion, change in vision, seizures, which may be signs of posterior reversible encephalopathy syndrome (PRES) Unusual bruising or bleeding Side effects that usually do not require medical attention (report to your care team if they continue or are bothersome): Diarrhea Drowsiness Hair loss Nausea Pain, redness, or swelling with sores inside the mouth or throat Vomiting This list may not describe all possible side effects. Call your doctor for medical advice about side effects. You may report side effects to FDA at 1-800-FDA-1088. Where should I keep my medication? This medication is given in a hospital or clinic. It will not be stored at home. NOTE: This sheet is a summary. It may not cover all possible information. If you have questions about this medicine, talk to your doctor, pharmacist, or health care provider.  2023 [employer]/Gold Standard (2007-08-20 00:00:00)

## 2022-09-03 NOTE — Progress Notes (Signed)
Question about the thorazine that was prescribed by Lauren. Concerned about side effects.

## 2022-09-07 ENCOUNTER — Other Ambulatory Visit: Payer: Self-pay

## 2022-09-07 ENCOUNTER — Encounter: Payer: Self-pay | Admitting: Hospice and Palliative Medicine

## 2022-09-07 ENCOUNTER — Encounter: Payer: Self-pay | Admitting: Emergency Medicine

## 2022-09-07 ENCOUNTER — Emergency Department: Payer: BLUE CROSS/BLUE SHIELD

## 2022-09-07 ENCOUNTER — Telehealth: Payer: Self-pay | Admitting: *Deleted

## 2022-09-07 ENCOUNTER — Inpatient Hospital Stay (HOSPITAL_BASED_OUTPATIENT_CLINIC_OR_DEPARTMENT_OTHER): Payer: BLUE CROSS/BLUE SHIELD | Admitting: Hospice and Palliative Medicine

## 2022-09-07 ENCOUNTER — Other Ambulatory Visit: Payer: Self-pay | Admitting: Medical Oncology

## 2022-09-07 ENCOUNTER — Inpatient Hospital Stay: Payer: BLUE CROSS/BLUE SHIELD

## 2022-09-07 ENCOUNTER — Other Ambulatory Visit: Payer: Self-pay | Admitting: *Deleted

## 2022-09-07 ENCOUNTER — Inpatient Hospital Stay
Admission: EM | Admit: 2022-09-07 | Discharge: 2022-09-12 | DRG: 270 | Disposition: A | Discharge status: Home / Self Care | Payer: BLUE CROSS/BLUE SHIELD | Source: Ambulatory Visit | Attending: Internal Medicine | Admitting: Internal Medicine

## 2022-09-07 VITALS — BP 77/59 | HR 116

## 2022-09-07 VITALS — BP 107/74 | HR 96 | Temp 98.1°F | Resp 14

## 2022-09-07 DIAGNOSIS — T451X5A Adverse effect of antineoplastic and immunosuppressive drugs, initial encounter: Secondary | ICD-10-CM | POA: Diagnosis present

## 2022-09-07 DIAGNOSIS — C641 Malignant neoplasm of right kidney, except renal pelvis: Secondary | ICD-10-CM | POA: Diagnosis not present

## 2022-09-07 DIAGNOSIS — Z87891 Personal history of nicotine dependence: Secondary | ICD-10-CM

## 2022-09-07 DIAGNOSIS — Z79899 Other long term (current) drug therapy: Secondary | ICD-10-CM

## 2022-09-07 DIAGNOSIS — R110 Nausea: Secondary | ICD-10-CM

## 2022-09-07 DIAGNOSIS — D631 Anemia in chronic kidney disease: Secondary | ICD-10-CM | POA: Diagnosis present

## 2022-09-07 DIAGNOSIS — N179 Acute kidney failure, unspecified: Secondary | ICD-10-CM | POA: Diagnosis not present

## 2022-09-07 DIAGNOSIS — Z880 Allergy status to penicillin: Secondary | ICD-10-CM

## 2022-09-07 DIAGNOSIS — Z7982 Long term (current) use of aspirin: Secondary | ICD-10-CM

## 2022-09-07 DIAGNOSIS — Z6825 Body mass index (BMI) 25.0-25.9, adult: Secondary | ICD-10-CM

## 2022-09-07 DIAGNOSIS — E860 Dehydration: Secondary | ICD-10-CM

## 2022-09-07 DIAGNOSIS — E785 Hyperlipidemia, unspecified: Secondary | ICD-10-CM | POA: Diagnosis present

## 2022-09-07 DIAGNOSIS — I10 Essential (primary) hypertension: Secondary | ICD-10-CM | POA: Diagnosis present

## 2022-09-07 DIAGNOSIS — E663 Overweight: Secondary | ICD-10-CM | POA: Diagnosis present

## 2022-09-07 DIAGNOSIS — Z515 Encounter for palliative care: Secondary | ICD-10-CM

## 2022-09-07 DIAGNOSIS — I959 Hypotension, unspecified: Secondary | ICD-10-CM | POA: Diagnosis present

## 2022-09-07 DIAGNOSIS — N182 Chronic kidney disease, stage 2 (mild): Secondary | ICD-10-CM | POA: Diagnosis present

## 2022-09-07 DIAGNOSIS — I82411 Acute embolism and thrombosis of right femoral vein: Secondary | ICD-10-CM | POA: Diagnosis not present

## 2022-09-07 DIAGNOSIS — Z8619 Personal history of other infectious and parasitic diseases: Secondary | ICD-10-CM

## 2022-09-07 DIAGNOSIS — D630 Anemia in neoplastic disease: Secondary | ICD-10-CM | POA: Diagnosis present

## 2022-09-07 DIAGNOSIS — Z885 Allergy status to narcotic agent status: Secondary | ICD-10-CM

## 2022-09-07 DIAGNOSIS — Z91018 Allergy to other foods: Secondary | ICD-10-CM

## 2022-09-07 DIAGNOSIS — Z1152 Encounter for screening for COVID-19: Secondary | ICD-10-CM

## 2022-09-07 DIAGNOSIS — E871 Hypo-osmolality and hyponatremia: Secondary | ICD-10-CM | POA: Diagnosis not present

## 2022-09-07 DIAGNOSIS — N400 Enlarged prostate without lower urinary tract symptoms: Secondary | ICD-10-CM | POA: Diagnosis present

## 2022-09-07 DIAGNOSIS — D6869 Other thrombophilia: Secondary | ICD-10-CM | POA: Diagnosis present

## 2022-09-07 DIAGNOSIS — I129 Hypertensive chronic kidney disease with stage 1 through stage 4 chronic kidney disease, or unspecified chronic kidney disease: Secondary | ICD-10-CM | POA: Diagnosis present

## 2022-09-07 DIAGNOSIS — D61810 Antineoplastic chemotherapy induced pancytopenia: Secondary | ICD-10-CM | POA: Diagnosis present

## 2022-09-07 DIAGNOSIS — R55 Syncope and collapse: Secondary | ICD-10-CM

## 2022-09-07 DIAGNOSIS — C786 Secondary malignant neoplasm of retroperitoneum and peritoneum: Secondary | ICD-10-CM | POA: Diagnosis present

## 2022-09-07 DIAGNOSIS — D696 Thrombocytopenia, unspecified: Secondary | ICD-10-CM | POA: Diagnosis not present

## 2022-09-07 DIAGNOSIS — I82409 Acute embolism and thrombosis of unspecified deep veins of unspecified lower extremity: Secondary | ICD-10-CM | POA: Diagnosis present

## 2022-09-07 DIAGNOSIS — C649 Malignant neoplasm of unspecified kidney, except renal pelvis: Secondary | ICD-10-CM | POA: Diagnosis present

## 2022-09-07 DIAGNOSIS — I824Y1 Acute embolism and thrombosis of unspecified deep veins of right proximal lower extremity: Secondary | ICD-10-CM | POA: Diagnosis not present

## 2022-09-07 DIAGNOSIS — Z5111 Encounter for antineoplastic chemotherapy: Secondary | ICD-10-CM | POA: Diagnosis not present

## 2022-09-07 DIAGNOSIS — R5383 Other fatigue: Secondary | ICD-10-CM | POA: Diagnosis present

## 2022-09-07 DIAGNOSIS — R627 Adult failure to thrive: Secondary | ICD-10-CM | POA: Diagnosis present

## 2022-09-07 DIAGNOSIS — I82220 Acute embolism and thrombosis of inferior vena cava: Secondary | ICD-10-CM | POA: Diagnosis present

## 2022-09-07 DIAGNOSIS — K219 Gastro-esophageal reflux disease without esophagitis: Secondary | ICD-10-CM | POA: Diagnosis present

## 2022-09-07 DIAGNOSIS — Z833 Family history of diabetes mellitus: Secondary | ICD-10-CM

## 2022-09-07 DIAGNOSIS — Z823 Family history of stroke: Secondary | ICD-10-CM

## 2022-09-07 DIAGNOSIS — I871 Compression of vein: Secondary | ICD-10-CM | POA: Diagnosis present

## 2022-09-07 DIAGNOSIS — Z825 Family history of asthma and other chronic lower respiratory diseases: Secondary | ICD-10-CM

## 2022-09-07 DIAGNOSIS — I82421 Acute embolism and thrombosis of right iliac vein: Secondary | ICD-10-CM | POA: Diagnosis present

## 2022-09-07 DIAGNOSIS — E876 Hypokalemia: Secondary | ICD-10-CM

## 2022-09-07 DIAGNOSIS — R112 Nausea with vomiting, unspecified: Secondary | ICD-10-CM | POA: Diagnosis present

## 2022-09-07 DIAGNOSIS — Z8551 Personal history of malignant neoplasm of bladder: Secondary | ICD-10-CM

## 2022-09-07 DIAGNOSIS — Z8249 Family history of ischemic heart disease and other diseases of the circulatory system: Secondary | ICD-10-CM

## 2022-09-07 LAB — CBC WITH DIFFERENTIAL/PLATELET
Abs Immature Granulocytes: 0.07 10*3/uL (ref 0.00–0.07)
Basophils Absolute: 0 10*3/uL (ref 0.0–0.1)
Basophils Relative: 0 %
Eosinophils Absolute: 0.1 10*3/uL (ref 0.0–0.5)
Eosinophils Relative: 1 %
HCT: 34 % — ABNORMAL LOW (ref 39.0–52.0)
Hemoglobin: 11.5 g/dL — ABNORMAL LOW (ref 13.0–17.0)
Immature Granulocytes: 1 %
Lymphocytes Relative: 5 %
Lymphs Abs: 0.5 10*3/uL — ABNORMAL LOW (ref 0.7–4.0)
MCH: 30.5 pg (ref 26.0–34.0)
MCHC: 33.8 g/dL (ref 30.0–36.0)
MCV: 90.2 fL (ref 80.0–100.0)
Monocytes Absolute: 0.1 10*3/uL (ref 0.1–1.0)
Monocytes Relative: 1 %
Neutro Abs: 8.8 10*3/uL — ABNORMAL HIGH (ref 1.7–7.7)
Neutrophils Relative %: 92 %
Platelets: 232 10*3/uL (ref 150–400)
RBC: 3.77 MIL/uL — ABNORMAL LOW (ref 4.22–5.81)
RDW: 11.7 % (ref 11.5–15.5)
WBC: 9.5 10*3/uL (ref 4.0–10.5)
nRBC: 0 % (ref 0.0–0.2)

## 2022-09-07 LAB — COMPREHENSIVE METABOLIC PANEL
ALT: 34 U/L (ref 0–44)
AST: 23 U/L (ref 15–41)
Albumin: 3.1 g/dL — ABNORMAL LOW (ref 3.5–5.0)
Alkaline Phosphatase: 134 U/L — ABNORMAL HIGH (ref 38–126)
Anion gap: 10 (ref 5–15)
BUN: 18 mg/dL (ref 8–23)
CO2: 26 mmol/L (ref 22–32)
Calcium: 8.5 mg/dL — ABNORMAL LOW (ref 8.9–10.3)
Chloride: 89 mmol/L — ABNORMAL LOW (ref 98–111)
Creatinine, Ser: 1.18 mg/dL (ref 0.61–1.24)
GFR, Estimated: >60 mL/min (ref >60)
Glucose, Bld: 156 mg/dL — ABNORMAL HIGH (ref 70–99)
Potassium: 4.5 mmol/L (ref 3.5–5.1)
Sodium: 125 mmol/L — ABNORMAL LOW (ref 135–145)
Total Bilirubin: 1.2 mg/dL (ref 0.3–1.2)
Total Protein: 7.2 g/dL (ref 6.5–8.1)

## 2022-09-07 LAB — CBC WITH DIFFERENTIAL (CANCER CENTER ONLY)
Abs Immature Granulocytes: 0.11 10*3/uL — ABNORMAL HIGH (ref 0.00–0.07)
Basophils Absolute: 0 10*3/uL (ref 0.0–0.1)
Basophils Relative: 0 %
Eosinophils Absolute: 0.1 10*3/uL (ref 0.0–0.5)
Eosinophils Relative: 2 %
HCT: 33.3 % — ABNORMAL LOW (ref 39.0–52.0)
Hemoglobin: 11.5 g/dL — ABNORMAL LOW (ref 13.0–17.0)
Immature Granulocytes: 1 %
Lymphocytes Relative: 9 %
Lymphs Abs: 0.8 10*3/uL (ref 0.7–4.0)
MCH: 30.7 pg (ref 26.0–34.0)
MCHC: 34.5 g/dL (ref 30.0–36.0)
MCV: 88.8 fL (ref 80.0–100.0)
Monocytes Absolute: 0.1 10*3/uL (ref 0.1–1.0)
Monocytes Relative: 2 %
Neutro Abs: 7.9 10*3/uL — ABNORMAL HIGH (ref 1.7–7.7)
Neutrophils Relative %: 86 %
Platelet Count: 213 10*3/uL (ref 150–400)
RBC: 3.75 MIL/uL — ABNORMAL LOW (ref 4.22–5.81)
RDW: 11.8 % (ref 11.5–15.5)
WBC Count: 9.2 10*3/uL (ref 4.0–10.5)
nRBC: 0 % (ref 0.0–0.2)

## 2022-09-07 LAB — CMP (CANCER CENTER ONLY)
ALT: 32 U/L (ref 0–44)
AST: 22 U/L (ref 15–41)
Albumin: 3.1 g/dL — ABNORMAL LOW (ref 3.5–5.0)
Alkaline Phosphatase: 132 U/L — ABNORMAL HIGH (ref 38–126)
Anion gap: 13 (ref 5–15)
BUN: 17 mg/dL (ref 8–23)
CO2: 25 mmol/L (ref 22–32)
Calcium: 8.7 mg/dL — ABNORMAL LOW (ref 8.9–10.3)
Chloride: 86 mmol/L — ABNORMAL LOW (ref 98–111)
Creatinine: 1.2 mg/dL (ref 0.61–1.24)
GFR, Estimated: >60 mL/min (ref >60)
Glucose, Bld: 121 mg/dL — ABNORMAL HIGH (ref 70–99)
Potassium: 3.1 mmol/L — ABNORMAL LOW (ref 3.5–5.1)
Sodium: 124 mmol/L — ABNORMAL LOW (ref 135–145)
Total Bilirubin: 0.9 mg/dL (ref 0.3–1.2)
Total Protein: 7.1 g/dL (ref 6.5–8.1)

## 2022-09-07 LAB — RESP PANEL BY RT-PCR (RSV, FLU A&B, COVID)  RVPGX2
Influenza A by PCR: NEGATIVE
Influenza B by PCR: NEGATIVE
Resp Syncytial Virus by PCR: NEGATIVE
SARS Coronavirus 2 by RT PCR: NEGATIVE

## 2022-09-07 LAB — URINALYSIS, W/ REFLEX TO CULTURE (INFECTION SUSPECTED)
Bacteria, UA: NONE SEEN
Bilirubin Urine: NEGATIVE
Glucose, UA: NEGATIVE mg/dL
Ketones, ur: NEGATIVE mg/dL
Leukocytes,Ua: NEGATIVE
Nitrite: NEGATIVE
Protein, ur: 30 mg/dL — AB
Specific Gravity, Urine: >1.046 — ABNORMAL HIGH (ref 1.005–1.030)
pH: 5 (ref 5.0–8.0)

## 2022-09-07 LAB — APTT: aPTT: 28 seconds (ref 24–36)

## 2022-09-07 LAB — TROPONIN I (HIGH SENSITIVITY)
Troponin I (High Sensitivity): 17 ng/L (ref <18)
Troponin I (High Sensitivity): 20 ng/L — ABNORMAL HIGH (ref <18)

## 2022-09-07 LAB — HIV ANTIBODY (ROUTINE TESTING W REFLEX): HIV Screen 4th Generation wRfx: NONREACTIVE

## 2022-09-07 LAB — D-DIMER, QUANTITATIVE: D-Dimer, Quant: 12.86 ug/mL-FEU — ABNORMAL HIGH (ref 0.00–0.50)

## 2022-09-07 LAB — MAGNESIUM: Magnesium: 2.2 mg/dL (ref 1.7–2.4)

## 2022-09-07 LAB — LACTIC ACID, PLASMA: Lactic Acid, Venous: 1.8 mmol/L (ref 0.5–1.9)

## 2022-09-07 LAB — PROTIME-INR
INR: 1.3 — ABNORMAL HIGH (ref 0.8–1.2)
Prothrombin Time: 15.6 seconds — ABNORMAL HIGH (ref 11.4–15.2)

## 2022-09-07 LAB — BRAIN NATRIURETIC PEPTIDE: B Natriuretic Peptide: 23.6 pg/mL (ref 0.0–100.0)

## 2022-09-07 MED ORDER — POTASSIUM CHLORIDE 20 MEQ/100ML IV SOLN
20.0000 meq | Freq: Once | INTRAVENOUS | Status: AC
  Administered 2022-09-07: 20 meq via INTRAVENOUS

## 2022-09-07 MED ORDER — SODIUM CHLORIDE 0.9 % IV BOLUS
1000.0000 mL | Freq: Once | INTRAVENOUS | Status: AC
  Administered 2022-09-07: 1000 mL via INTRAVENOUS

## 2022-09-07 MED ORDER — PANTOPRAZOLE SODIUM 40 MG PO TBEC
40.0000 mg | DELAYED_RELEASE_TABLET | Freq: Every day | ORAL | Status: DC
  Administered 2022-09-10 – 2022-09-12 (×2): 40 mg via ORAL
  Filled 2022-09-07 (×2): qty 1

## 2022-09-07 MED ORDER — HEPARIN (PORCINE) 25000 UT/250ML-% IV SOLN
1550.0000 [IU]/h | INTRAVENOUS | Status: DC
  Administered 2022-09-07: 1400 [IU]/h via INTRAVENOUS
  Administered 2022-09-08: 1550 [IU]/h via INTRAVENOUS
  Filled 2022-09-07 (×2): qty 250

## 2022-09-07 MED ORDER — ALBUTEROL SULFATE (2.5 MG/3ML) 0.083% IN NEBU: 2.5000 mg | INHALATION_SOLUTION | RESPIRATORY_TRACT | Status: DC | PRN

## 2022-09-07 MED ORDER — ONDANSETRON HCL 4 MG PO TABS: 4.0000 mg | ORAL_TABLET | Freq: Four times a day (QID) | ORAL | Status: DC | PRN

## 2022-09-07 MED ORDER — SODIUM CHLORIDE 0.9 % IV SOLN: INTRAVENOUS | Status: DC

## 2022-09-07 MED ORDER — ONDANSETRON HCL 4 MG/2ML IJ SOLN: 4.0000 mg | Freq: Four times a day (QID) | INTRAMUSCULAR | Status: DC | PRN

## 2022-09-07 MED ORDER — ACETAMINOPHEN 650 MG RE SUPP: 650.0000 mg | Freq: Four times a day (QID) | RECTAL | Status: DC | PRN

## 2022-09-07 MED ORDER — HYDROCODONE-ACETAMINOPHEN 5-325 MG PO TABS: 1.0000 | ORAL_TABLET | ORAL | Status: DC | PRN

## 2022-09-07 MED ORDER — HYDRALAZINE HCL 20 MG/ML IJ SOLN: 10.0000 mg | Freq: Four times a day (QID) | INTRAMUSCULAR | Status: DC | PRN

## 2022-09-07 MED ORDER — HEPARIN BOLUS VIA INFUSION
5500.0000 [IU] | Freq: Once | INTRAVENOUS | Status: AC
  Administered 2022-09-07: 5500 [IU] via INTRAVENOUS
  Filled 2022-09-07: qty 5500

## 2022-09-07 MED ORDER — SODIUM CHLORIDE 0.9 % IV SOLN
INTRAVENOUS | Status: DC
  Filled 2022-09-07 (×2): qty 250

## 2022-09-07 MED ORDER — POTASSIUM CHLORIDE 20 MEQ/100ML IV SOLN: 20.0000 meq | Freq: Once | INTRAVENOUS | Status: DC

## 2022-09-07 MED ORDER — ACETAMINOPHEN 325 MG PO TABS: 650.0000 mg | ORAL_TABLET | Freq: Four times a day (QID) | ORAL | Status: DC | PRN

## 2022-09-07 MED ORDER — SODIUM CHLORIDE 0.9 % IV SOLN
10.0000 mg | Freq: Once | INTRAVENOUS | Status: AC
  Administered 2022-09-07: 10 mg via INTRAVENOUS
  Filled 2022-09-07: qty 1

## 2022-09-07 MED ORDER — ONDANSETRON HCL 4 MG/2ML IJ SOLN
8.0000 mg | Freq: Once | INTRAMUSCULAR | Status: AC
  Administered 2022-09-07: 8 mg via INTRAVENOUS
  Filled 2022-09-07: qty 4

## 2022-09-07 MED ORDER — HYDROMORPHONE HCL 1 MG/ML IJ SOLN: 0.5000 mg | INTRAMUSCULAR | Status: DC | PRN

## 2022-09-07 MED ORDER — TAMSULOSIN HCL 0.4 MG PO CAPS
0.4000 mg | ORAL_CAPSULE | Freq: Every day | ORAL | Status: DC
  Administered 2022-09-10 – 2022-09-12 (×2): 0.4 mg via ORAL
  Filled 2022-09-07 (×2): qty 1

## 2022-09-07 MED ORDER — SODIUM CHLORIDE 0.9% FLUSH
10.0000 mL | Freq: Once | INTRAVENOUS | Status: AC
  Administered 2022-09-07: 10 mL via INTRAVENOUS
  Filled 2022-09-07: qty 10

## 2022-09-07 MED ORDER — TRAZODONE HCL 50 MG PO TABS: 25.0000 mg | ORAL_TABLET | Freq: Every evening | ORAL | Status: DC | PRN

## 2022-09-07 MED ORDER — IOHEXOL 350 MG/ML SOLN
100.0000 mL | Freq: Once | INTRAVENOUS | Status: AC | PRN
  Administered 2022-09-07: 100 mL via INTRAVENOUS

## 2022-09-07 NOTE — ED Triage Notes (Signed)
Pt sent from oncology due to +orthostatics, nausea and right leg pain and swelling. Pt reports feeling weak all weekend, last tx 2/22. Denies SOB. Last given 8 mg zofran at 1146 with no relief.

## 2022-09-07 NOTE — Progress Notes (Signed)
Symptom Management [place] at [facility] [phone] Fax:([phone]) [phone]  Patient Care Team: Jeffrey Sons, MD as PCP - General (Family Medicine) Jeffrey Co, MD as Consulting Physician (Urology) Jeffrey Huger, MD as Consulting Physician (Oncology)   NAME OF PATIENT: Jeffrey Fernandez  [id number]  04-07-1956   DATE OF VISIT: 09/07/22  REASON FOR CONSULT: Jeffrey Fernandez is a 67 y.o. 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  PET scan on 08/14/2022 shows hypermetabolic kidney mass with retroperitoneal lymphadenopathy and multiple bilateral pulmonary nodules.  INTERVAL HISTORY: Patient received day 1, cycle 1 cisplatin/gemcitabine on 08/27/2022 and returned on 09/03/2022 to see Dr. Grayland Fernandez and received day 8, cycle 1.  Patient presents [facility] today for evaluation of nausea, vomiting, diarrhea, and dizziness with position changes.  He says that he has not been eating or drinking well since his chemotherapy last week.  He had progressive fatigue and is felt overall worse since yesterday.  He has had 3-4 episodes of loose, watery diarrhea.  Patient noticed right lower extremity edema today.  Denies any neurologic complaints. Denies recent fevers or illnesses. Denies any easy bleeding or bruising. Denies chest pain. Denies urinary complaints. Patient offers no further specific complaints today.  PAST MEDICAL HISTORY: Past Medical History:  Diagnosis Date   Cancer ([place]) 2013   bladder   Diverticulitis    History of chickenpox    History of measles    History of mumps    Hyperlipidemia    Hypertension     PAST SURGICAL HISTORY:  Past Surgical History:  Procedure Laterality Date   HERNIA REPAIR  2002,2003   IR IMAGING GUIDED PORT INSERTION  08/25/2022   SIGMOIDOSCOPY  1998   [facility]   TRANSURETHRAL RESECTION OF BLADDER TUMOR  05/11/2012   Dr. Sherral Fernandez, [facility]    VASECTOMY  2002   Bilateral inguinal herniorrhaphies    HEMATOLOGY/ONCOLOGY HISTORY:  Oncology History  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[place])  08/19/2022 Initial Diagnosis   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[place])   08/19/2022 Cancer Staging   Staging form: Kidney, AJCC 8th Edition - Clinical stage from 08/19/2022: Stage IV (cT4, cN1, cM1) - Signed by Jeffrey Huger, MD on 08/19/2022   08/27/2022 -  Chemotherapy   Patient is on Treatment Plan : Urothelial carcinoma of kidney: Cisplatin D1 + Gemcitabine D1,8 q21d x 6 Cycles       ALLERGIES:  is allergic to amoxicillin, atenolol, other, oxycodone, and shellfish allergy.  MEDICATIONS:  Current Outpatient Medications  Medication Sig Dispense Refill   acetaminophen (TYLENOL) 500 MG tablet Take 500 mg by mouth every 6 (six) hours as needed. (Patient not taking: Reported on 09/03/2022)     aspirin 81 MG tablet Take 81 mg by mouth daily. (Patient not taking: Reported on 08/19/2022)     chlorproMAZINE (THORAZINE) 10 MG tablet Take 1 tablet (10 mg total) by mouth 3 (three) times daily as needed for hiccoughs or nausea. Do not take when taking Zofran/ondansetron. (Patient not taking: Reported on 09/03/2022) 30 tablet 0   docusate sodium (COLACE) 250 MG capsule Take 250 mg by mouth daily. (Patient not taking: Reported on 09/01/2022)     fentaNYL (DURAGESIC) 25 MCG/HR Place 1 patch onto the skin every 3 (three) days. (Patient not taking: Reported on 08/31/2022) 10 patch 0   HYDROcodone-acetaminophen (NORCO) 5-325 MG tablet Take 1 tablet by mouth every 6 (six) hours as needed for moderate  pain. (Patient not taking: Reported on 08/31/2022) 120 tablet 0   lidocaine-prilocaine (EMLA) cream Apply to affected area once (Patient not taking: Reported on 08/31/2022) 30 g 3   losartan-hydrochlorothiazide (HYZAAR) 100-12.5 MG tablet TAKE 1 TABLET BY MOUTH EVERY DAY 90 tablet 0   metoprolol succinate (TOPROL-XL) 25 MG 24 hr tablet TAKE 1 TABLET (25 MG TOTAL) BY MOUTH DAILY.  90 tablet 2   Multiple Vitamins-Minerals (OCUVITE ADULT FORMULA PO) Take 1 capsule by mouth. (Patient not taking: Reported on 09/01/2022)     multivitamin-lutein (OCUVITE-LUTEIN) CAPS capsule Take 1 capsule by mouth daily. (Patient not taking: Reported on 09/01/2022)     ondansetron (ZOFRAN) 8 MG tablet Take 1 tablet (8 mg total) by mouth every 8 (eight) hours as needed for nausea or vomiting. 90 tablet 3   ondansetron (ZOFRAN-ODT) 8 MG disintegrating tablet Take 8 mg by mouth every 8 (eight) hours as needed. (Patient not taking: Reported on 08/31/2022)     pantoprazole (PROTONIX) 40 MG tablet TAKE 1 TABLET BY MOUTH EVERY DAY 90 tablet 2   prochlorperazine (COMPAZINE) 10 MG tablet Take 1 tablet (10 mg total) by mouth every 6 (six) hours as needed (Nausea or vomiting). (Patient not taking: Reported on 08/31/2022) 60 tablet 2   sildenafil (REVATIO) 20 MG tablet Take 1-3 tablets (20-60 mg total) by mouth daily as needed. (Patient not taking: Reported on 08/12/2022) 30 tablet 11   Specialty Vitamins Products ([employer] PROSTATE HEALTH FORMULA PO)  (Patient not taking: Reported on 08/12/2022)     tamsulosin (FLOMAX) 0.4 MG CAPS capsule Take 1 capsule (0.4 mg total) by mouth daily. (Patient not taking: Reported on 09/01/2022) 30 capsule 11   No current facility-administered medications for this visit.    VITAL SIGNS: There were no vitals taken for this visit. There were no vitals filed for this visit.  Estimated body mass index is 25.84 kg/m as calculated from the following:   Height as of 09/03/22: '5\' 9"'$  (1.753 m).   Weight as of 09/03/22: 175 lb (79.4 kg).  LABS: CBC:    Component Value Date/Time   WBC 11.7 (H) 09/03/2022 0917   HGB 13.3 09/03/2022 0917   HGB 12.7 (L) 09/01/2022 0849   HGB 16.7 12/02/2021 1332   HCT 38.8 (L) 09/03/2022 0917   HCT 46.9 12/02/2021 1332   PLT 200 09/03/2022 0917   PLT 307 09/01/2022 0849   PLT 359 12/02/2021 1332   MCV 89.6 09/03/2022 0917   MCV 93 12/02/2021 1332    NEUTROABS 9.3 (H) 09/03/2022 0917   NEUTROABS 4.7 06/10/2020 0855   LYMPHSABS 1.0 09/03/2022 0917   LYMPHSABS 2.2 06/10/2020 0855   MONOABS 0.9 09/03/2022 0917   EOSABS 0.4 09/03/2022 0917   EOSABS 0.2 06/10/2020 0855   BASOSABS 0.0 09/03/2022 0917   BASOSABS 0.1 06/10/2020 0855   Comprehensive Metabolic Panel:    Component Value Date/Time   NA 129 (L) 09/03/2022 0917   NA 142 12/02/2021 1332   K 3.7 09/03/2022 0917   CL 91 (L) 09/03/2022 0917   CO2 28 09/03/2022 0917   BUN 22 09/03/2022 0917   BUN 12 12/02/2021 1332   CREATININE 1.40 (H) 09/03/2022 0917   GLUCOSE 114 (H) 09/03/2022 0917   CALCIUM 9.4 09/03/2022 0917   AST 34 09/03/2022 0917   ALT 67 (H) 09/03/2022 0917   ALKPHOS 173 (H) 09/03/2022 0917   BILITOT 1.1 09/03/2022 0917   PROT 7.5 09/03/2022 0917   PROT 7.7 12/02/2021 1332   ALBUMIN  3.4 (L) 09/03/2022 0917   ALBUMIN 4.7 12/02/2021 1332    RADIOGRAPHIC STUDIES: IR IMAGING GUIDED PORT INSERTION  Result Date: 08/25/2022 INDICATION: Chemotherapy EXAM: Chest port placement using ultrasound and fluoroscopic guidance MEDICATIONS: Documented in the EMR ANESTHESIA/SEDATION: Moderate (conscious) sedation was employed during this procedure. A total of Versed 2 mg and Fentanyl 100 mcg was administered intravenously. Moderate Sedation Time: 28 minutes. The patient's level of consciousness and vital signs were monitored continuously by radiology nursing throughout the procedure under my direct supervision. FLUOROSCOPY TIME:  Fluoroscopy Time: 0.4 minutes (1 mGy) COMPLICATIONS: None immediate. PROCEDURE: Informed written consent was obtained from the patient after a thorough discussion of the procedural risks, benefits and alternatives. All questions were addressed. Maximal Sterile Barrier Technique was utilized including caps, mask, sterile gowns, sterile gloves, sterile drape, hand hygiene and skin antiseptic. A timeout was performed prior to the initiation of the procedure. The  patient was placed supine on the exam table. The right neck and chest was prepped and draped in the standard sterile fashion. A preliminary ultrasound of the right neck was performed and demonstrates a patent right internal jugular vein. A permanent ultrasound image was stored in the electronic medical record. The overlying skin was anesthetized with 1% Lidocaine. Using ultrasound guidance, access was obtained into the right internal jugular vein using a 21 gauge micropuncture set. A wire was advanced into the SVC, a short incision was made at the puncture site, and serial dilatation performed. Next, in an ipsilateral infraclavicular location, an incision was made at the site of the subcutaneous reservoir. Blunt dissection was used to open a pocket to contain the reservoir. A subcutaneous tunnel was then created from the port site to the puncture site. A(n) 8 Fr single lumen catheter was advanced through the tunnel. The catheter was attached to the port and this was placed in the subcutaneous pocket. Under fluoroscopic guidance, a peel away sheath was placed, and the catheter was trimmed to the appropriate length and was advanced into the central veins. The catheter length is 22 cm. The tip of the catheter lies near the superior cavoatrial junction. The port flushes and aspirates appropriately. The port was flushed and locked with heparinized saline. The port pocket was closed in 2 layers using 3-0 and 4-0 Vicryl/absorbable suture. Dermabond was also applied to both incisions. The patient tolerated the procedure well and was transferred to recovery in stable condition. IMPRESSION: Successful placement of a right-sided chest port via the right internal jugular vein. The port is ready for use. Electronically Signed   By: Albin Felling M.D.   On: 08/25/2022 12:10   NM PET Image Initial (PI) Skull Base To Thigh  Result Date: 08/14/2022 CLINICAL DATA:  Subsequent treatment strategy for metastatic bladder cancer.  Retroperitoneal node biopsy 08/12/2022 (pending). EXAM: NUCLEAR MEDICINE PET SKULL BASE TO THIGH TECHNIQUE: 9.72 mCi F-18 FDG was injected intravenously. Full-ring PET imaging was performed from the skull base to thigh after the radiotracer. CT data was obtained and used for attenuation correction and anatomic localization. Fasting blood glucose: 96 mg/dl COMPARISON:  Chest CT 08/05/2022.  Abdominopelvic CT 07/31/2022. FINDINGS: Mediastinal blood pool activity: SUV max 1.3 NECK: No hypermetabolic cervical lymph nodes are identified. No suspicious activity identified within the pharyngeal mucosal space. Incidental CT findings: none CHEST: There are no hypermetabolic mediastinal, hilar or axillary lymph nodes. Best shown on recent CT, there are innumerable pulmonary nodules bilaterally which demonstrate hypermetabolic activity, highly concerning for metastatic disease. The individual nodules are suboptimally  assessed due to breathing artifact and misregistration with the PET data. The most intense metabolic activity is in the right perihilar region (SUV max 5.0). A 9 mm right lower lobe nodule on image 112/2 has an SUV max of 2.8. Additional nodules include a 9 mm left upper lobe nodule on image 104/2 and a left lower lobe nodule measuring 10 mm on image 116/2. Incidental CT findings: Mild aortic and coronary artery atherosclerosis. ABDOMEN/PELVIS: There is no hypermetabolic activity within the liver, adrenal glands, spleen or pancreas. As shown on recent CT, there is a large centrally necrotic mass involving the posterior aspect of the right kidney, measuring up to 6.0 x 4.2 cm on image 168/2. This demonstrates peripheral hypermetabolic activity and as correlated with recent CT is consistent with renal cell carcinoma. The retroperitoneal adenopathy seen on CT is hypermetabolic. For example, lymph nodes along the right renal vessels measuring up to 4.0 x 2.2 cm on image 170/2 have an SUV max of 9.5. Aortocaval node  measuring 3.1 x 1.9 cm on image 187/2 has an SUV max of 8.8, and there is a left periaortic node measuring 11 mm on image 167/2 which has an SUV max of 6.6. No hypermetabolic nodes are identified in the pelvis. No definite abnormality of the bladder or prostate gland. Incidental CT findings: Simple appearing cyst in the upper pole of the left kidney appears stable, without worrisome features. No follow-up imaging recommended. Aortic and branch vessel atherosclerosis. Mild diverticulosis of the descending and sigmoid colon. Prostatomegaly. SKELETON: There is no hypermetabolic activity to suggest osseous metastatic disease. Incidental CT findings: Mild spondylosis. 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 IMPRESSION: 1. Large centrally necrotic right renal mass with peripheral hypermetabolic activity consistent with renal cell carcinoma. 2. Hypermetabolic retroperitoneal adenopathy and innumerable hypermetabolic pulmonary nodules bilaterally consistent with metastatic disease, likely from renal cell carcinoma. 3. No pelvic adenopathy or definite bladder abnormality to suggest bladder cancer. No evidence of osseous metastatic disease. 4. 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 5.  Aortic Atherosclerosis (ICD10-I70.0). Electronically Signed   By: Richardean Sale M.D.   On: 08/14/2022 14:47   CT ABDOMINAL MASS BIOPSY  Result Date: 08/12/2022 INDICATION: Metastatic retroperitoneal adenopathy, right renal cell carcinoma by imaging EXAM: CT-GUIDED BIOPSY METASTATIC RIGHT RETROPERITONEAL ADENOPATHY MEDICATIONS: 1% LIDOCAINE LOCAL ANESTHESIA/SEDATION: 1.0 mg IV Versed; 50 mcg IV Fentanyl Moderate Sedation Time:  10 MINUTE The patient was continuously monitored during the procedure by the interventional radiology nurse under my direct supervision. PROCEDURE: The procedure, risks, benefits, and alternatives  were explained to the patient. Questions regarding the procedure were encouraged and answered. The patient understands and consents to the procedure. Previous imaging reviewed. Patient positioned prone. Noncontrast localization CT performed. The right retroperitoneal adenopathy was localized and marked for a right posterior paraspinous approach. Under sterile conditions and local anesthesia, the 17 gauge 11.8 cm guide was advanced from a right posterior paraspinous approach to the adenopathy. Needle position confirmed with CT. 18 gauge core biopsies obtained. These were placed in formalin. Samples were intact and non fragmented. Needle removed. Postprocedure imaging demonstrates no hemorrhage or hematoma. Patient tolerated the procedure well without complication. Vital sign monitoring by nursing staff during the procedure will continue as patient is in the special procedures unit for post procedure observation. FINDINGS: The images document guide needle placement within the metastatic retroperitoneal adenopathy. Post biopsy images demonstrate no hemorrhage or hematoma. COMPLICATIONS: None immediate.  IMPRESSION: Successful CT-guided core biopsy of metastatic retroperitoneal adenopathy. Marland Kitchen RADIATION DOSE REDUCTION: This exam was performed according to the departmental dose-optimization program which includes automated exposure control, adjustment of the mA and/or kV according to patient size and/or use of iterative reconstruction technique. Electronically Signed   By: Jerilynn Mages.  Shick M.D.   On: 08/12/2022 11:57    PERFORMANCE STATUS (ECOG) : 2 - Symptomatic, <50% confined to bed  Review of Systems Unless otherwise noted, a complete review of systems is negative.  Physical Exam General: NAD Cardiovascular: regular rate and rhythm Pulmonary: clear ant fields Abdomen: soft, nontender, + bowel sounds GU: no suprapubic tenderness Extremities: RLE edema, no joint deformities Skin: no rashes Neurological: Weakness  but otherwise nonfocal  IMPRESSION/PLAN: Nausea/vomiting/diarrhea -symptoms likely reflect poor tolerance to chemotherapy.  Patient will require further discussion with Dr. Grayland Fernandez for possible dose adjustment of his chemo regimen.  Patient was noted to be hyponatremic thought secondary to dehydration.  He received 1 L IV fluids in addition to antiemetics/steroids but remained orthostatic with little improvement in his overall symptoms and nausea.  Patient opted to go to the emergency department for further evaluation and management.  RLE edema -recommend [place] to evaluate for possible DVT.   Patient transferred to the emergency department.    Patient expressed understanding and was in agreement with this plan. He also understands that He can call clinic at any time with any questions, concerns, or complaints.   Thank you for allowing me to participate in the care of this very pleasant patient.   Time Total: 45 minutes  Visit consisted of counseling and education dealing with the complex and emotionally intense issues of symptom management in the setting of serious illness.Greater than 50%  of this time was spent counseling and coordinating care related to the above assessment and plan.  Signed by: Altha Harm, PhD, NP-C

## 2022-09-07 NOTE — ED Notes (Signed)
Patient transported to CT 

## 2022-09-07 NOTE — Research (Signed)
[id number]: A RANDOMIZED TRIAL ADDRESSING CANCER-RELATED FINANCIAL HARDSHIP THROUGH DELIVERY OF A PROACTIVE FINANCIAL NAVIGATION INTERVENTION (CREDIT)   Patient in to the cancer center this am for symptom management clinic with his wife. Research nurse informed patient and spouse that he was randomized to the financial navigation arm with his registration on Wednesday February 21,2024. Instructions for video review provided to the patient with website for access. Protocol information sheet provided to the patients spouse. Requested the patient watch the videos within the next ten days if possible. Patient and spouse denied any questions at this time. Encouraged to call if they have any problems or concerns.  Jeral Fruit, RN 09/07/22 10:38 AM

## 2022-09-07 NOTE — Progress Notes (Signed)
Handoff of care given to [facility], [place] at [employer]. Per Traci Sermon, RN patient went to bathroom around 1230. Per patient, patient voided but did not have a bowel movement. At 1253- Wife asked to see Nira Conn, RN. Patient voiced concerns over severe right leg pain from the "thigh to lower extremity." Edema of right lower leg observed. Patient reports that the leg feels like a spasm. Josh, NP ordered stat doppler, which u/s dept could accommodate around 230 pm today.  At 1pm, pt felt the urge to go to the bathroom for a bowel movement. Pt transported to bathroom via wheelchair via support staff.  Per Wife, patient is unable to have a bowel movement. Pt stated that he was "feeling dizzy and loopy." Vitals repeated by RN at 1315- bp 77/59; HR 116.- after transferring from toilet to wheelchair.  Provider paged with vitals.- Josh at bedside at [place] NP held 2nd bag of potassium 20 meq and Nira Conn, RN and Merrily Pew, NP transported patient to ER via w/c. Hand off given to front first nurse by Merrily Pew, NP via telephone. I personally gave hand off to Joycelyn Schmid, [place].   Port a cath remained accessed- saline locked.

## 2022-09-07 NOTE — Telephone Encounter (Signed)
Wife called reporting that patient needs to come in for IV fluids today. He thought he would be fine without it this week, but he is very weak and feels he is going to pass out. VS 100/80, P-110, R-13. She states that his color is pale yellow. Please advise

## 2022-09-07 NOTE — Progress Notes (Signed)
Patient arrived on unit via stretcher.  Transfer to hospital bed with assist from ED staff.  Patient and daughter, Naaman Plummer, oriented to room and unit with understanding.

## 2022-09-07 NOTE — Telephone Encounter (Signed)
Patient coming in to office now to be seen in [facility]

## 2022-09-07 NOTE — ED Notes (Signed)
US at bedside

## 2022-09-07 NOTE — H&P (Signed)
History and Physical    Jeffrey Fernandez [id number] DOB: 1955-11-12 DOA: 09/07/2022  PCP: Birdie Sons, MD  Patient coming from:Home  I have personally briefly reviewed patient's old medical records in [place]  Chief Complaint: Syncope, RLE pain  HPI: Jeffrey Fernandez is a 67 y.o. 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  Patient endorses poor p.o. intake since chemotherapy last week.  Progressive fatigue.  Patient noted his right lower extremity to be edematous and painful with decreased range of motion.  Sent from cancer center to emergency room.  In the ED found to have extensive lower extremity DVT extending into multiple veins.  On my evaluation patient appears fatigued and weak otherwise stable.  He endorses pain in right lower extremity.  No other complaints noted at time my evaluation.  Blood pressure low normal otherwise stable.  Normal heart rate.  No oxygen requirement.  ED Course: Patient started on intravenous heparin after ultrasound results.  CT angiogram ordered by EDP and pending at time of this note.  Started on intravenous heparin.  Vascular surgery contacted.  Hospitalist contacted for admission.  Review of Systems: As per HPI otherwise 14 point review of systems negative.    Past Medical History:  Diagnosis Date   Cancer [facility]) 2013   bladder   Diverticulitis    History of chickenpox    History of measles    History of mumps    Hyperlipidemia    Hypertension     Past Surgical History:  Procedure Laterality Date   HERNIA REPAIR  2002,2003   IR IMAGING GUIDED PORT INSERTION  08/25/2022   SIGMOIDOSCOPY  1998   [facility]   TRANSURETHRAL RESECTION OF BLADDER TUMOR  05/11/2012   Dr. Sherral Hammers, [facility]   VASECTOMY  2002   Bilateral  inguinal herniorrhaphies     reports that he quit smoking about 11 years ago. His smoking use included cigarettes. He has a 78.00 pack-year smoking history. He has been exposed to tobacco smoke. He has never used smokeless tobacco. He reports current alcohol use. He reports that he does not use drugs.  Allergies  Allergen Reactions   Amoxicillin Itching   Atenolol Diarrhea    Other reaction(s): HIVES   Other Nausea And Vomiting    Navy Beans   Oxycodone     Other reaction(s): HIVES   Shellfish Allergy Itching and Swelling    Family History  Problem Relation Age of Onset   COPD Mother    Diverticulosis Mother    Diverticulosis Father    Diabetes Father    Hypertension Father    Diverticulosis Sister    Stroke Paternal Grandmother      Prior to Admission medications   Medication Sig Start Date End Date Taking? Authorizing Provider  acetaminophen (TYLENOL) 500 MG tablet Take 500 mg by mouth every 6 (six) hours as needed. Patient not taking: Reported on 09/03/2022    [provider]  aspirin 81 MG tablet Take 81 mg by mouth daily. Patient not taking: Reported on 08/19/2022    [provider]  chlorproMAZINE (THORAZINE) 10 MG tablet Take 1 tablet (10 mg total) by mouth 3 (three) times daily as needed for hiccoughs or nausea. Do not take when taking Zofran/ondansetron. Patient not taking: Reported on 09/03/2022 09/01/22   Beckey Rutter  G, NP  docusate sodium (COLACE) 250 MG capsule Take 250 mg by mouth daily. Patient not taking: Reported on 09/01/2022    [provider]  fentaNYL (DURAGESIC) 25 MCG/HR Place 1 patch onto the skin every 3 (three) days. Patient not taking: Reported on 08/31/2022 08/19/22   Lloyd Huger, MD  HYDROcodone-acetaminophen (NORCO) 5-325 MG tablet Take 1 tablet by mouth every 6 (six) hours as needed for moderate pain. Patient not taking: Reported on 08/31/2022 08/05/22   Lloyd Huger, MD  lidocaine-prilocaine (EMLA) cream Apply  to affected area once 08/19/22   Lloyd Huger, MD  losartan-hydrochlorothiazide [facility]) 100-12.5 MG tablet TAKE 1 TABLET BY MOUTH EVERY DAY Patient not taking: Reported on 09/07/2022 07/29/22   Birdie Sons, MD  metoprolol succinate (TOPROL-XL) 25 MG 24 hr tablet TAKE 1 TABLET (25 MG TOTAL) BY MOUTH DAILY. Patient not taking: Reported on 09/07/2022 01/29/22   Birdie Sons, MD  Multiple Vitamins-Minerals (OCUVITE ADULT FORMULA PO) Take 1 capsule by mouth. Patient not taking: Reported on 09/07/2022    [provider]  multivitamin-lutein [facility]) CAPS capsule Take 1 capsule by mouth daily. Patient not taking: Reported on 09/07/2022    [provider]  ondansetron (ZOFRAN) 8 MG tablet Take 1 tablet (8 mg total) by mouth every 8 (eight) hours as needed for nausea or vomiting. 08/21/22   Borders, Kirt Boys, NP  ondansetron (ZOFRAN-ODT) 8 MG disintegrating tablet Take 8 mg by mouth every 8 (eight) hours as needed. Patient not taking: Reported on 08/31/2022 08/21/22   [provider]  pantoprazole (PROTONIX) 40 MG tablet TAKE 1 TABLET BY MOUTH EVERY DAY 01/29/22   Birdie Sons, MD  prochlorperazine (COMPAZINE) 10 MG tablet Take 1 tablet (10 mg total) by mouth every 6 (six) hours as needed (Nausea or vomiting). Patient not taking: Reported on 08/31/2022 08/19/22   Lloyd Huger, MD  sildenafil (REVATIO) 20 MG tablet Take 1-3 tablets (20-60 mg total) by mouth daily as needed. Patient not taking: Reported on 08/12/2022 12/24/21   Billey Co, MD  Specialty Vitamins Products ([place])  11/28/21   [provider]  tamsulosin (FLOMAX) 0.4 MG CAPS capsule Take 1 capsule (0.4 mg total) by mouth daily. Patient not taking: Reported on 09/01/2022 07/28/22   Billey Co, MD    Physical Exam: Vitals:   09/07/22 1344 09/07/22 1446 09/07/22 1500 09/07/22 1600  BP:  104/76 107/78 113/79  Pulse:  (!) 104    Resp:  '17 14 15  '$ Temp:       TempSrc:      SpO2:  92%    Weight: 79 kg     Height: '5\' 9"'$  (1.753 m)       Vitals:   09/07/22 1344 09/07/22 1446 09/07/22 1500 09/07/22 1600  BP:  104/76 107/78 113/79  Pulse:  (!) 104    Resp:  '17 14 15  '$ Temp:      TempSrc:      SpO2:  92%    Weight: 79 kg     Height: '5\' 9"'$  (1.753 m)      General: Appears fatigued otherwise no apparent distress HEENT: Normocephalic, atraumatic Neck, supple, trachea midline, no tenderness Heart: Regular rate and rhythm, S1/S2 normal, no murmurs Lungs: Clear to auscultation bilaterally, no adventitious sounds, normal work of breathing Abdomen: Soft, nontender, nondistended, positive bowel sounds Extremities: Right lower extremity:, Tender, decreased ROM Skin: No rashes or lesions, normal color Neurologic: Cranial  nerves grossly intact, sensation intact, alert and oriented x3 Psychiatric: Normal affect  Labs on Admission: I have personally reviewed following labs and imaging studies  CBC: Recent Labs  Lab 09/01/22 0849 09/03/22 0917 09/07/22 1006 09/07/22 1350  WBC 9.7 11.7* 9.2 9.5  NEUTROABS 8.2* 9.3* 7.9* 8.8*  HGB 12.7* 13.3 11.5* 11.5*  HCT 36.6* 38.8* 33.3* 34.0*  MCV 89.3 89.6 88.8 90.2  PLT 307 200 213 [id number]   Basic Metabolic Panel: Recent Labs  Lab 09/01/22 0849 09/03/22 0917 09/07/22 1006 09/07/22 1350  NA 129* 129* 124* 125*  K 3.3* 3.7 3.1* 4.5  CL 93* 91* 86* 89*  CO2 '26 28 25 26  '$ GLUCOSE 105* 114* 121* 156*  BUN 27* '22 17 18  '$ CREATININE 1.49* 1.40* 1.20 1.18  CALCIUM 9.1 9.4 8.7* 8.5*  MG  --  2.2 2.2  --    GFR: Estimated Creatinine Clearance: 62.4 mL/min (by C-G formula based on SCr of 1.18 mg/dL). Liver Function Tests: Recent Labs  Lab 09/01/22 0849 09/03/22 0917 09/07/22 1006 09/07/22 1350  AST 37 34 22 23  ALT 71* 67* 32 34  ALKPHOS 167* 173* 132* 134*  BILITOT 0.9 1.1 0.9 1.2  PROT 7.1 7.5 7.1 7.2  ALBUMIN 3.2* 3.4* 3.1* 3.1*   No results for input(s): "LIPASE", "AMYLASE" in the last 168  hours. No results for input(s): "AMMONIA" in the last 168 hours. Coagulation Profile: Recent Labs  Lab 09/07/22 1428  INR 1.3*   Cardiac Enzymes: No results for input(s): "CKTOTAL", "CKMB", "CKMBINDEX", "TROPONINI" in the last 168 hours. BNP (last 3 results) No results for input(s): "PROBNP" in the last 8760 hours. HbA1C: No results for input(s): "HGBA1C" in the last 72 hours. CBG: No results for input(s): "GLUCAP" in the last 168 hours. Lipid Profile: No results for input(s): "CHOL", "HDL", "LDLCALC", "TRIG", "CHOLHDL", "LDLDIRECT" in the last 72 hours. Thyroid Function Tests: No results for input(s): "TSH", "T4TOTAL", "FREET4", "T3FREE", "THYROIDAB" in the last 72 hours. Anemia Panel: No results for input(s): "VITAMINB12", "FOLATE", "FERRITIN", "TIBC", "IRON", "RETICCTPCT" in the last 72 hours. Urine analysis:    Component Value Date/Time   COLORURINE YELLOW (A) 08/31/2022 1207   APPEARANCEUR CLEAR (A) 08/31/2022 1207   LABSPEC 1.018 08/31/2022 1207   PHURINE 5.0 08/31/2022 1207   GLUCOSEU NEGATIVE 08/31/2022 1207   HGBUR SMALL (A) 08/31/2022 1207   BILIRUBINUR NEGATIVE 08/31/2022 1207   BILIRUBINUR negative 06/10/2020 0819   KETONESUR NEGATIVE 08/31/2022 1207   PROTEINUR 30 (A) 08/31/2022 1207   UROBILINOGEN 0.2 06/10/2020 0819   NITRITE NEGATIVE 08/31/2022 1207   LEUKOCYTESUR NEGATIVE 08/31/2022 1207    Radiological Exams on Admission: US Venous Img Lower Right (DVT Study)  Result Date: 09/07/2022 CLINICAL DATA:  Acute right lower extremity pain and edema. EXAM: Right LOWER EXTREMITY VENOUS DOPPLER ULTRASOUND TECHNIQUE: Gray-scale sonography with graded compression, as well as color Doppler and duplex ultrasound were performed to evaluate the lower extremity deep venous systems from the level of the common femoral vein and including the common femoral, femoral, profunda femoral, popliteal and calf veins including the posterior tibial, peroneal and gastrocnemius veins  when visible. The superficial great saphenous vein was also interrogated. Spectral Doppler was utilized to evaluate flow at rest and with distal augmentation maneuvers in the common femoral, femoral and popliteal veins. COMPARISON:  None Available. FINDINGS: Contralateral Common Femoral Vein: Respiratory phasicity is normal and symmetric with the symptomatic side. No evidence of thrombus. Normal compressibility. Common Femoral Vein: Noncompressible with no flow consistent  with occlusive thrombus. Saphenofemoral Junction: Noncompressible with no flow consistent with occlusive thrombus. Profunda Femoral Vein: Noncompressible with no flow consistent with occlusive thrombus. Femoral Vein: Only partially compressible with possible rouleaux flow present. Popliteal Vein: Only partially compressible with possible rouleaux low flow present. Calf Veins: No evidence of thrombus. Normal compressibility and flow on color Doppler imaging. Superficial Great Saphenous Vein: No evidence of thrombus. Normal compressibility. Venous Reflux:  None. Other Findings: There also appears to be noncompressible thrombus in the visualized portions of the right common and external iliac veins. IMPRESSION: Occlusive deep venous thrombosis is noted in the right common femoral and profundus femoral veins as well as the right saphenofemoral junction. The right superficial femoral and popliteal veins are only partially compressible with probable rouleaux flow present. There also appears to be noncompressible thrombus within the visualized portions of the right common and external iliac veins. Electronically Signed   By: Marijo Conception M.D.   On: 09/07/2022 15:46   DG Chest Port 1 View  Result Date: 09/07/2022 CLINICAL DATA:  Provided history: Questionable sepsis. Evaluate for abnormality. EXAM: PORTABLE CHEST 1 VIEW COMPARISON:  PET-CT 08/14/2022. FINDINGS: Right chest infusion port catheter with tip at the level of the superior cavoatrial  junction. Heart size within normal limits. Numerous bilateral pulmonary nodules consistent with known metastatic disease (as demonstrated on the recent prior PET-CT of 08/14/2022). No appreciable superimposed acute airspace consolidation. No evidence of pleural effusion or pneumothorax. No acute bony abnormality identified. Levocurvature of the thoracic spine. IMPRESSION: Numerous bilateral pulmonary nodules consistent with known metastatic disease (as demonstrated on the recent prior PET-CT of 08/14/2022). No appreciable superimposed acute airspace consolidation. Electronically Signed   By: Kellie Simmering D.O.   On: 09/07/2022 14:23    EKG: Independently reviewed.  Normal sinus rhythm  Assessment/Plan Principal Problem:   DVT (deep venous thrombosis) (HCC) Active Problems:   Essential (primary) hypertension   Personal history of bladder cancer   Gastroesophageal reflux disease   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HCC)  Right lower extremity DVT Suspect secondary to hypercoagulable state in the setting of malignancy.  This DVT is extensive and quite symptomatic. Plan: Place in observation IV fluids IV heparin GTT Vascular surgery consult, consideration for thrombectomy As needed pain control  Near syncope Hypotension Likely in the setting of poor p.o. intake/anorexia Aggressive IVF Fall precautions  Stage IV urothelial carcinoma Seen outpatient by Dr. Grayland Ormond and Altha Harm nurse practitioner palliative care Notified Altha Harm of patient's admission  Essential hypertension BP meds on hold  BPH PTA Flomax  Intractable nausea and vomiting As needed Zofran Daily Protonix   DVT prophylaxis: Heparin GTT Code Status: Full Family Communication: Wife at bedside Disposition Plan: Anticipate return to previous home environment Consults called: Vascular surgery Admission status: MedSurg, observation   Sidney Ace MD [facility]   If 7PM-7AM, please  contact night-coverage   09/07/2022, 4:56 PM

## 2022-09-07 NOTE — Consult Note (Signed)
ANTICOAGULATION CONSULT NOTE - Initial Consult  Pharmacy Consult for heparin infusion Indication: DVT  Allergies  Allergen Reactions   Amoxicillin Itching   Atenolol Diarrhea    Other reaction(s): HIVES   Other Nausea And Vomiting    Navy Beans   Oxycodone     Other reaction(s): HIVES   Shellfish Allergy Itching and Swelling    Patient Measurements: Height: '5\' 9"'$  (175.3 cm) Weight: 79 kg (174 lb 2.6 oz) IBW/kg (Calculated) : 70.7 Heparin Dosing Weight: 79 kg  Vital Signs: Temp: 97.5 F (36.4 C) (02/26 1342) Temp Source: Oral (02/26 1342) BP: 113/79 (02/26 1600) Pulse Rate: 104 (02/26 1446)  Labs: Recent Labs    09/07/22 1006 09/07/22 1350 09/07/22 1428  HGB 11.5* 11.5*  --   HCT 33.3* 34.0*  --   PLT 213 232  --   APTT  --   --  28  LABPROT  --   --  15.6*  INR  --   --  1.3*  CREATININE 1.20 1.18  --     Estimated Creatinine Clearance: 62.4 mL/min (by C-G formula based on SCr of 1.18 mg/dL).   Medical History: Past Medical History:  Diagnosis Date   Cancer [facility]) 2013   bladder   Diverticulitis    History of chickenpox    History of measles    History of mumps    Hyperlipidemia    Hypertension     Medications:  PTA: N/A Inpatient: heparin infusion (2/26 >>>) Allergies: No AC/APT related allergies  Assessment: 67 yearold male presents to ED with compliant of severe leg pain. US DVT Study on right lower extremity showing Occlusive DVT in the right common femoral and profundus femoral veins as well as the right saphenofemoral junction. Pharmacy consulted for management of heparin infusion in the setting of DVT.   Date Time aPTT/HL Rate/Comment  Goal of Therapy:  Heparin level 0.3-0.7 units/ml Monitor platelets by anticoagulation protocol: Yes   Plan:  Give 5500 units bolus x1; then start heparin infusion at 1400 units/hr Check anti-Xa level in 6 hours and daily once consecutively therapeutic. Continue to monitor H&H and platelets daily while  on heparin gtt.   Darrick Penna Clinical Pharmacist 09/07/2022 4:05 PM

## 2022-09-07 NOTE — ED Provider Notes (Signed)
[facility] Provider Note    Event Date/Time   First MD Initiated Contact with Patient 09/07/22 1357     (approximate)   History   Nausea and Leg Swelling   HPI  Jeffrey Fernandez is a 67 y.o. male past medical history significant for urethral carcinoma stage IV undergoing chemotherapy, who presents to the emergency department with hypotension and syncopal episode.  Patient was at visit today for an infusion and had an episode when he went to get up of hypotension.  Felt very lightheaded.  Then noted significant swelling to his right lower leg.  Denies any significant chest pain but does endorse some mild shortness of breath.  Improvement of his symptoms with laying down.  Not on anticoagulation.  Denies nausea or vomiting.     Physical Exam   Triage Vital Signs: ED Triage Vitals  Enc Vitals Group     BP 09/07/22 1342 (!) 94/57     Pulse Rate 09/07/22 1342 (!) 125     Resp 09/07/22 1342 16     Temp 09/07/22 1342 (!) 97.5 F (36.4 C)     Temp Source 09/07/22 1342 Oral     SpO2 09/07/22 1342 91 %     Weight 09/07/22 1344 174 lb 2.6 oz (79 kg)     Height 09/07/22 1344 '5\' 9"'$  (1.753 m)     Head Circumference --      Peak Flow --      Pain Score 09/07/22 1344 4     Pain Loc --      Pain Edu? --      Excl. in [place]? --     Most recent vital signs: Vitals:   09/07/22 1500 09/07/22 1600  BP: 107/78 113/79  Pulse:    Resp: 14 15  Temp:    SpO2:      Physical Exam Constitutional:      Appearance: He is well-developed.  Eyes:     Conjunctiva/sclera: Conjunctivae normal.  Cardiovascular:     Rate and Rhythm: Tachycardia present.  Pulmonary:     Effort: No respiratory distress.  Abdominal:     Tenderness: There is abdominal tenderness.  Musculoskeletal:     Cervical back: Normal range of motion.     Right lower leg: Edema present.     Left lower leg: No edema.     Comments: Unilateral right lower leg swelling  Skin:    General: Skin is warm.   Neurological:     Mental Status: He is alert. Mental status is at baseline.     IMPRESSION / MDM / ASSESSMENT AND PLAN / ED COURSE  I reviewed the triage vital signs and the nursing notes.  Differential diagnosis including infectious process, dehydration, electrolyte abnormality, pulmonary embolism, DVT  EKG   Sinus tachycardia while on cardiac telemetry.  RADIOLOGY I independently reviewed imaging, my interpretation of imaging: Ultrasound DVT with extensive DVT to the right lower extremity.  Read as extensive DVT -to the right common femoral and profundus veins.  LABS (all labs ordered are listed, but only abnormal results are displayed) Labs interpreted as -    Labs Reviewed  COMPREHENSIVE METABOLIC PANEL - Abnormal; Notable for the following components:      Result Value   Sodium 125 (*)    Chloride 89 (*)    Glucose, Bld 156 (*)    Calcium 8.5 (*)    Albumin 3.1 (*)    Alkaline Phosphatase 134 (*)  All other components within normal limits  CBC WITH DIFFERENTIAL/PLATELET - Abnormal; Notable for the following components:   RBC 3.77 (*)    Hemoglobin 11.5 (*)    HCT 34.0 (*)    Neutro Abs 8.8 (*)    Lymphs Abs 0.5 (*)    All other components within normal limits  PROTIME-INR - Abnormal; Notable for the following components:   Prothrombin Time 15.6 (*)    INR 1.3 (*)    All other components within normal limits  RESP PANEL BY RT-PCR (RSV, FLU A&B, COVID)  RVPGX2  CULTURE, BLOOD (ROUTINE X 2)  CULTURE, BLOOD (ROUTINE X 2)  LACTIC ACID, PLASMA  APTT  URINALYSIS, W/ REFLEX TO CULTURE (INFECTION SUSPECTED)  D-DIMER, QUANTITATIVE  HEPARIN LEVEL (UNFRACTIONATED)  BRAIN NATRIURETIC PEPTIDE  TROPONIN I (HIGH SENSITIVITY)    TREATMENT  Heparin infusion, 1 L of IV fluids  MDM    Clinical picture concerning for extensive DVT to the right lower extremity.  Blood pressure improved with laying down.  CTA is currently pending to evaluate for pulmonary embolism.   Provoked DVT secondary to hypercoagulable state from malignancy.  Heparin bolus and infusion.  Message sent to vascular surgery for extensive DVT with Dr. Delana Meyer, admitted to the hospitalist.   PROCEDURES:  Critical Care performed: yes  .Critical Care  Performed by: Nathaniel Man, MD Authorized by: Nathaniel Man, MD   Critical care provider statement:    Critical care time (minutes):  40   Critical care time was exclusive of:  Separately billable procedures and treating other patients   Critical care was necessary to treat or prevent imminent or life-threatening deterioration of the following conditions: Extensive DVT requiring heparin.   Critical care was time spent personally by me on the following activities:  Development of treatment plan with patient or surrogate, discussions with consultants, evaluation of patient's response to treatment, examination of patient, ordering and review of laboratory studies, ordering and review of radiographic studies, ordering and performing treatments and interventions, pulse oximetry, re-evaluation of patient's condition and review of old charts   Patient's presentation is most consistent with acute presentation with potential threat to life or bodily function.   MEDICATIONS ORDERED IN ED: Medications  heparin bolus via infusion 5,500 Units (has no administration in time range)  heparin ADULT infusion 100 units/mL (25000 units/283m) (has no administration in time range)  sodium chloride 0.9 % bolus 1,000 mL (0 mLs Intravenous Stopped 09/07/22 1554)    FINAL CLINICAL IMPRESSION(S) / ED DIAGNOSES   Final diagnoses:  Acute deep vein thrombosis (DVT) of femoral vein of right lower extremity (HCC)  Syncope, unspecified syncope type     Rx / DC Orders   ED Discharge Orders     None        Note:  This document was prepared using Dragon voice recognition software and may include unintentional dictation errors.   MNathaniel Man  MD 09/07/22 [phone]

## 2022-09-08 ENCOUNTER — Inpatient Hospital Stay: Payer: BLUE CROSS/BLUE SHIELD

## 2022-09-08 ENCOUNTER — Encounter
Admission: EM | Disposition: A | Discharge status: Home / Self Care | Payer: Self-pay | Source: Ambulatory Visit | Attending: Internal Medicine

## 2022-09-08 DIAGNOSIS — E785 Hyperlipidemia, unspecified: Secondary | ICD-10-CM | POA: Diagnosis present

## 2022-09-08 DIAGNOSIS — C649 Malignant neoplasm of unspecified kidney, except renal pelvis: Secondary | ICD-10-CM | POA: Diagnosis not present

## 2022-09-08 DIAGNOSIS — K219 Gastro-esophageal reflux disease without esophagitis: Secondary | ICD-10-CM | POA: Diagnosis not present

## 2022-09-08 DIAGNOSIS — I82423 Acute embolism and thrombosis of iliac vein, bilateral: Secondary | ICD-10-CM | POA: Diagnosis not present

## 2022-09-08 DIAGNOSIS — Z515 Encounter for palliative care: Secondary | ICD-10-CM

## 2022-09-08 DIAGNOSIS — C642 Malignant neoplasm of left kidney, except renal pelvis: Secondary | ICD-10-CM | POA: Diagnosis not present

## 2022-09-08 DIAGNOSIS — C786 Secondary malignant neoplasm of retroperitoneum and peritoneum: Secondary | ICD-10-CM | POA: Diagnosis present

## 2022-09-08 DIAGNOSIS — I959 Hypotension, unspecified: Secondary | ICD-10-CM | POA: Diagnosis present

## 2022-09-08 DIAGNOSIS — I82411 Acute embolism and thrombosis of right femoral vein: Secondary | ICD-10-CM | POA: Diagnosis present

## 2022-09-08 DIAGNOSIS — I10 Essential (primary) hypertension: Secondary | ICD-10-CM | POA: Diagnosis not present

## 2022-09-08 DIAGNOSIS — D631 Anemia in chronic kidney disease: Secondary | ICD-10-CM | POA: Diagnosis present

## 2022-09-08 DIAGNOSIS — N179 Acute kidney failure, unspecified: Secondary | ICD-10-CM | POA: Diagnosis not present

## 2022-09-08 DIAGNOSIS — I824Y1 Acute embolism and thrombosis of unspecified deep veins of right proximal lower extremity: Secondary | ICD-10-CM | POA: Diagnosis not present

## 2022-09-08 DIAGNOSIS — N182 Chronic kidney disease, stage 2 (mild): Secondary | ICD-10-CM | POA: Diagnosis present

## 2022-09-08 DIAGNOSIS — I871 Compression of vein: Secondary | ICD-10-CM | POA: Diagnosis present

## 2022-09-08 DIAGNOSIS — I82220 Acute embolism and thrombosis of inferior vena cava: Secondary | ICD-10-CM

## 2022-09-08 DIAGNOSIS — Z7969 Long term (current) use of other immunomodulators and immunosuppressants: Secondary | ICD-10-CM

## 2022-09-08 DIAGNOSIS — Z8551 Personal history of malignant neoplasm of bladder: Secondary | ICD-10-CM | POA: Diagnosis not present

## 2022-09-08 DIAGNOSIS — R55 Syncope and collapse: Secondary | ICD-10-CM | POA: Diagnosis not present

## 2022-09-08 DIAGNOSIS — I82421 Acute embolism and thrombosis of right iliac vein: Secondary | ICD-10-CM

## 2022-09-08 DIAGNOSIS — Z1152 Encounter for screening for COVID-19: Secondary | ICD-10-CM | POA: Diagnosis not present

## 2022-09-08 DIAGNOSIS — Z823 Family history of stroke: Secondary | ICD-10-CM | POA: Diagnosis not present

## 2022-09-08 DIAGNOSIS — I82409 Acute embolism and thrombosis of unspecified deep veins of unspecified lower extremity: Secondary | ICD-10-CM | POA: Diagnosis present

## 2022-09-08 DIAGNOSIS — D696 Thrombocytopenia, unspecified: Secondary | ICD-10-CM | POA: Diagnosis not present

## 2022-09-08 DIAGNOSIS — D61810 Antineoplastic chemotherapy induced pancytopenia: Secondary | ICD-10-CM | POA: Diagnosis present

## 2022-09-08 DIAGNOSIS — C641 Malignant neoplasm of right kidney, except renal pelvis: Secondary | ICD-10-CM | POA: Diagnosis present

## 2022-09-08 DIAGNOSIS — E871 Hypo-osmolality and hyponatremia: Secondary | ICD-10-CM | POA: Diagnosis not present

## 2022-09-08 DIAGNOSIS — I82811 Embolism and thrombosis of superficial veins of right lower extremities: Secondary | ICD-10-CM

## 2022-09-08 DIAGNOSIS — T451X5A Adverse effect of antineoplastic and immunosuppressive drugs, initial encounter: Secondary | ICD-10-CM | POA: Diagnosis present

## 2022-09-08 DIAGNOSIS — I129 Hypertensive chronic kidney disease with stage 1 through stage 4 chronic kidney disease, or unspecified chronic kidney disease: Secondary | ICD-10-CM | POA: Diagnosis present

## 2022-09-08 DIAGNOSIS — Z87891 Personal history of nicotine dependence: Secondary | ICD-10-CM

## 2022-09-08 DIAGNOSIS — I82413 Acute embolism and thrombosis of femoral vein, bilateral: Secondary | ICD-10-CM | POA: Diagnosis not present

## 2022-09-08 DIAGNOSIS — R627 Adult failure to thrive: Secondary | ICD-10-CM | POA: Diagnosis present

## 2022-09-08 DIAGNOSIS — D6869 Other thrombophilia: Secondary | ICD-10-CM | POA: Diagnosis present

## 2022-09-08 DIAGNOSIS — N400 Enlarged prostate without lower urinary tract symptoms: Secondary | ICD-10-CM | POA: Diagnosis present

## 2022-09-08 HISTORY — PX: PERIPHERAL VASCULAR THROMBECTOMY: CATH118306

## 2022-09-08 LAB — CBC
HCT: 28.1 % — ABNORMAL LOW (ref 39.0–52.0)
Hemoglobin: 9.6 g/dL — ABNORMAL LOW (ref 13.0–17.0)
MCH: 30.7 pg (ref 26.0–34.0)
MCHC: 34.2 g/dL (ref 30.0–36.0)
MCV: 89.8 fL (ref 80.0–100.0)
Platelets: 169 10*3/uL (ref 150–400)
RBC: 3.13 MIL/uL — ABNORMAL LOW (ref 4.22–5.81)
RDW: 11.9 % (ref 11.5–15.5)
WBC: 5.3 10*3/uL (ref 4.0–10.5)
nRBC: 0 % (ref 0.0–0.2)

## 2022-09-08 LAB — BASIC METABOLIC PANEL
Anion gap: 9 (ref 5–15)
BUN: 20 mg/dL (ref 8–23)
CO2: 27 mmol/L (ref 22–32)
Calcium: 8.1 mg/dL — ABNORMAL LOW (ref 8.9–10.3)
Chloride: 93 mmol/L — ABNORMAL LOW (ref 98–111)
Creatinine, Ser: 1.26 mg/dL — ABNORMAL HIGH (ref 0.61–1.24)
GFR, Estimated: >60 mL/min (ref >60)
Glucose, Bld: 99 mg/dL (ref 70–99)
Potassium: 3.1 mmol/L — ABNORMAL LOW (ref 3.5–5.1)
Sodium: 129 mmol/L — ABNORMAL LOW (ref 135–145)

## 2022-09-08 LAB — HEPARIN LEVEL (UNFRACTIONATED)
Heparin Unfractionated: 0.26 IU/mL — ABNORMAL LOW (ref 0.30–0.70)
Heparin Unfractionated: 0.34 IU/mL (ref 0.30–0.70)
Heparin Unfractionated: 0.39 IU/mL (ref 0.30–0.70)

## 2022-09-08 SURGERY — PERIPHERAL VASCULAR THROMBECTOMY
Anesthesia: Moderate Sedation | Laterality: Right

## 2022-09-08 MED ORDER — SODIUM CHLORIDE 0.9 % IV SOLN: INTRAVENOUS | Status: DC

## 2022-09-08 MED ORDER — VANCOMYCIN HCL IN DEXTROSE 1-5 GM/200ML-% IV SOLN
1000.0000 mg | INTRAVENOUS | Status: AC
  Filled 2022-09-08: qty 200

## 2022-09-08 MED ORDER — METHYLPREDNISOLONE SODIUM SUCC 125 MG IJ SOLR: 125.0000 mg | Freq: Once | INTRAMUSCULAR | Status: DC | PRN

## 2022-09-08 MED ORDER — METHYLPREDNISOLONE SODIUM SUCC 125 MG IJ SOLR
INTRAMUSCULAR | Status: AC
  Filled 2022-09-08: qty 2

## 2022-09-08 MED ORDER — FENTANYL CITRATE PF 50 MCG/ML IJ SOSY: 12.5000 ug | PREFILLED_SYRINGE | Freq: Once | INTRAMUSCULAR | Status: DC | PRN

## 2022-09-08 MED ORDER — HYDROMORPHONE HCL 1 MG/ML IJ SOLN: 1.0000 mg | Freq: Once | INTRAMUSCULAR | Status: DC | PRN

## 2022-09-08 MED ORDER — MIDAZOLAM HCL 5 MG/5ML IJ SOLN
INTRAMUSCULAR | Status: AC
  Filled 2022-09-08: qty 5

## 2022-09-08 MED ORDER — ONDANSETRON HCL 4 MG/2ML IJ SOLN: 4.0000 mg | Freq: Four times a day (QID) | INTRAMUSCULAR | Status: DC | PRN

## 2022-09-08 MED ORDER — HEPARIN BOLUS VIA INFUSION
1150.0000 [IU] | Freq: Once | INTRAVENOUS | Status: AC
  Administered 2022-09-08: 1150 [IU] via INTRAVENOUS
  Filled 2022-09-08: qty 1150

## 2022-09-08 MED ORDER — FAMOTIDINE 20 MG PO TABS: 40.0000 mg | ORAL_TABLET | Freq: Once | ORAL | Status: DC | PRN

## 2022-09-08 MED ORDER — METHYLPREDNISOLONE SODIUM SUCC 40 MG IJ SOLR
INTRAMUSCULAR | Status: DC | PRN
  Administered 2022-09-08: 125 mg via INTRAVENOUS

## 2022-09-08 MED ORDER — CHLORHEXIDINE GLUCONATE CLOTH 2 % EX PADS: 6.0000 | MEDICATED_PAD | Freq: Once | CUTANEOUS | Status: DC

## 2022-09-08 MED ORDER — FENTANYL CITRATE PF 50 MCG/ML IJ SOSY
PREFILLED_SYRINGE | INTRAMUSCULAR | Status: AC
  Filled 2022-09-08: qty 1

## 2022-09-08 MED ORDER — DIPHENHYDRAMINE HCL 50 MG/ML IJ SOLN
INTRAMUSCULAR | Status: AC
  Filled 2022-09-08: qty 1

## 2022-09-08 MED ORDER — FENTANYL CITRATE (PF) 100 MCG/2ML IJ SOLN
INTRAMUSCULAR | Status: DC | PRN
  Administered 2022-09-08 (×3): 50 ug via INTRAVENOUS

## 2022-09-08 MED ORDER — DIPHENHYDRAMINE HCL 50 MG/ML IJ SOLN: 50.0000 mg | Freq: Once | INTRAMUSCULAR | Status: DC | PRN

## 2022-09-08 MED ORDER — HEPARIN (PORCINE) 25000 UT/250ML-% IV SOLN
1700.0000 [IU]/h | INTRAVENOUS | Status: DC
  Administered 2022-09-08: 1550 [IU]/h via INTRAVENOUS
  Administered 2022-09-09 – 2022-09-11 (×4): 1700 [IU]/h via INTRAVENOUS
  Filled 2022-09-08 (×5): qty 250

## 2022-09-08 MED ORDER — POTASSIUM CHLORIDE CRYS ER 20 MEQ PO TBCR
40.0000 meq | EXTENDED_RELEASE_TABLET | Freq: Once | ORAL | Status: AC
  Administered 2022-09-08: 40 meq via ORAL
  Filled 2022-09-08: qty 2

## 2022-09-08 MED ORDER — MIDAZOLAM HCL 2 MG/2ML IJ SOLN
INTRAMUSCULAR | Status: DC | PRN
  Administered 2022-09-08 (×2): 1 mg via INTRAVENOUS
  Administered 2022-09-08: 2 mg via INTRAVENOUS

## 2022-09-08 MED ORDER — CHLORHEXIDINE GLUCONATE CLOTH 2 % EX PADS
6.0000 | MEDICATED_PAD | Freq: Every day | CUTANEOUS | Status: DC
  Administered 2022-09-08 – 2022-09-12 (×5): 6 via TOPICAL

## 2022-09-08 MED ORDER — FENTANYL CITRATE (PF) 100 MCG/2ML IJ SOLN
INTRAMUSCULAR | Status: AC
  Filled 2022-09-08: qty 2

## 2022-09-08 MED ORDER — FAMOTIDINE 20 MG PO TABS
ORAL_TABLET | ORAL | Status: AC
  Filled 2022-09-08: qty 2

## 2022-09-08 MED ORDER — IODIXANOL 320 MG/ML IV SOLN
INTRAVENOUS | Status: DC | PRN
  Administered 2022-09-08: 75 mL via INTRAVENOUS

## 2022-09-08 MED ORDER — VANCOMYCIN HCL IN DEXTROSE 1-5 GM/200ML-% IV SOLN
INTRAVENOUS | Status: AC
  Administered 2022-09-08: 1000 mg via INTRAVENOUS
  Filled 2022-09-08: qty 200

## 2022-09-08 MED ORDER — MIDAZOLAM HCL 2 MG/ML PO SYRP: 8.0000 mg | ORAL_SOLUTION | Freq: Once | ORAL | Status: DC | PRN

## 2022-09-08 SURGICAL SUPPLY — 24 items
BALLN ARMADA 14X40X80 (BALLOONS) ×1
BALLN ATG 16X4X80 (BALLOONS) ×2
BALLOON ARMADA 14X40X80 (BALLOONS) IMPLANT
BALLOON ATG 16X4X80 (BALLOONS) IMPLANT
CANISTER PENUMBRA ENGINE (MISCELLANEOUS) IMPLANT
CATH BEACON 5 .035 65 KMP TIP (CATHETERS) IMPLANT
CATH LIGHTNI FLASH 16XTORQ 100 (CATHETERS) IMPLANT
CATH LIGHTNING FLASH XTORQ 100 (CATHETERS) ×1
CLOSURE PERCLOSE PROSTYLE (VASCULAR PRODUCTS) IMPLANT
COVER PROBE ULTRASOUND 5X96 (MISCELLANEOUS) IMPLANT
DEVICE TORQUE .025-.038 (MISCELLANEOUS) IMPLANT
GLIDEWIRE STIFF .35X180X3 HYDR (WIRE) IMPLANT
KIT ENCORE 26 ADVANTAGE (KITS) IMPLANT
NDL ENTRY 21GA 7CM ECHOTIP (NEEDLE) IMPLANT
NEEDLE ENTRY 21GA 7CM ECHOTIP (NEEDLE) ×1 IMPLANT
PACK ANGIOGRAPHY (CUSTOM PROCEDURE TRAY) ×1 IMPLANT
SET INTRO CAPELLA COAXIAL (SET/KITS/TRAYS/PACK) IMPLANT
SHEATH BRITE TIP 6FRX11 (SHEATH) IMPLANT
SHEATH DRYSEAL FLEX 16FR 33CM (SHEATH) IMPLANT
SUT MNCRL 4-0 (SUTURE) ×1
SUT MNCRL 4-0 27XMFL (SUTURE) ×1
SUTURE MNCRL 4-0 27XMF (SUTURE) IMPLANT
WIRE GUIDERIGHT .035X150 (WIRE) IMPLANT
WIRE SUPRACORE 190CM (WIRE) IMPLANT

## 2022-09-08 NOTE — H&P (View-Only) (Signed)
Hospital Consult    Reason for Consult:  right Lower extremity DVT Requesting Physician:  Dr. Ralene Muskrat MD MRN #:  [id number]  History of Present Illness: Jeffrey Fernandez is a 67 y.o. 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  Patient endorses poor p.o. intake since chemotherapy last week.  Progressive fatigue.   Patient noted his right lower extremity to be edematous and painful with decreased range of motion.  Sent from cancer center to emergency room.  In the ED found to have extensive lower extremity DVT extending into multiple veins.  On my exam this morning the patient is resting comfortably in bed with his wife at the bedside. He states he has continuous pain in his right lower extremity. He also states he is continuously nauseated and has no appetite. Wife states he does not drink enough fluids all day and because of his fatigue he does not ambulate like he should.   Past Medical History:  Diagnosis Date   Cancer [facility]) 2013   bladder   Diverticulitis    History of chickenpox    History of measles    History of mumps    Hyperlipidemia    Hypertension     Past Surgical History:  Procedure Laterality Date   HERNIA REPAIR  2002,2003   IR IMAGING GUIDED PORT INSERTION  08/25/2022   SIGMOIDOSCOPY  1998   [facility]   TRANSURETHRAL RESECTION OF BLADDER TUMOR  05/11/2012   Dr. Sherral Hammers, [place]  2002   Bilateral inguinal herniorrhaphies    Allergies  Allergen Reactions   Amoxicillin Itching   Atenolol Diarrhea    Other reaction(s): HIVES   Other Nausea And Vomiting    Navy Beans   Oxycodone     Other reaction(s): HIVES   Shellfish Allergy Itching and Swelling    Prior to Admission medications   Medication Sig Start Date End  Date Taking? Authorizing Provider  acetaminophen (TYLENOL) 500 MG tablet Take 500 mg by mouth every 6 (six) hours as needed.   Yes [provider]  aspirin 81 MG tablet Take 81 mg by mouth daily.   Yes [provider]  chlorproMAZINE (THORAZINE) 10 MG tablet Take 1 tablet (10 mg total) by mouth 3 (three) times daily as needed for hiccoughs or nausea. Do not take when taking Zofran/ondansetron. 09/01/22  Yes Verlon Au, NP  docusate sodium (COLACE) 250 MG capsule Take 250 mg by mouth daily.   Yes [provider]  fentaNYL (DURAGESIC) 25 MCG/HR Place 1 patch onto the skin every 3 (three) days. 08/19/22  Yes Lloyd Huger, MD  HYDROcodone-acetaminophen (NORCO) 5-325 MG tablet Take 1 tablet by mouth every 6 (six) hours as needed for moderate pain. 08/05/22  Yes Lloyd Huger, MD  lidocaine-prilocaine (EMLA) cream Apply to affected area once 08/19/22  Yes Finnegan, Kathlene November, MD  losartan-hydrochlorothiazide (HYZAAR) 100-12.5 MG tablet TAKE 1 TABLET BY MOUTH EVERY DAY 07/29/22  Yes Birdie Sons, MD  metoprolol succinate (TOPROL-XL) 25 MG 24 hr tablet TAKE 1 TABLET (25 MG TOTAL) BY MOUTH DAILY. 01/29/22  Yes Birdie Sons, MD  ondansetron (ZOFRAN) 8 MG tablet Take 1 tablet (8 mg total) by mouth every 8 (eight) hours as needed for nausea or vomiting. 08/21/22  Yes Borders, Kirt Boys,  NP  ondansetron (ZOFRAN-ODT) 8 MG disintegrating tablet Take 8 mg by mouth every 8 (eight) hours as needed. 08/21/22  Yes [provider]  pantoprazole (PROTONIX) 40 MG tablet TAKE 1 TABLET BY MOUTH EVERY DAY 01/29/22  Yes Birdie Sons, MD  prochlorperazine (COMPAZINE) 10 MG tablet Take 1 tablet (10 mg total) by mouth every 6 (six) hours as needed (Nausea or vomiting). 08/19/22  Yes Lloyd Huger, MD  sildenafil (REVATIO) 20 MG tablet Take 1-3 tablets (20-60 mg total) by mouth daily as needed. 12/24/21  Yes Billey Co, MD  tamsulosin (FLOMAX) 0.4 MG CAPS capsule Take 1  capsule (0.4 mg total) by mouth daily. 07/28/22  Yes Billey Co, MD  Multiple Vitamins-Minerals (OCUVITE ADULT FORMULA PO) Take 1 capsule by mouth. Patient not taking: Reported on 09/07/2022    [provider]  multivitamin-lutein [facility]) CAPS capsule Take 1 capsule by mouth daily. Patient not taking: Reported on 09/07/2022    [provider]  Specialty Vitamins Products ([employer] PROSTATE HEALTH FORMULA PO)  11/28/21   [provider]    Social History   Socioeconomic History   Marital status: Married    Spouse name: Jeffrey Fernandez   Number of children: 3   Years of education: Not on file   Highest education level: Not on file  Occupational History   Occupation: Truck Geophysicist/field seismologist  Tobacco Use   Smoking status: Former    Packs/day: 2.00    Years: 39.00    Total pack years: 78.00    Types: Cigarettes    Quit date: 07/14/2011    Years since quitting: 11.1    Passive exposure: Past   Smokeless tobacco: Never   Tobacco comments:    smoked 1 to 1 1/2  ppd for 35 years  Vaping Use   Vaping Use: Never used  Substance and Sexual Activity   Alcohol use: Yes    Comment: drinks 2 beers daily: none since 1 week ago   Drug use: No   Sexual activity: Not on file  Other Topics Concern   Not on file  Social History Narrative   Lives at home with wife , daughter , 3 grand daughters, son & his 2 kids    Social Determinants of Health   Financial Resource Strain: Not on file  Food Insecurity: No Food Insecurity (09/07/2022)   Hunger Vital Sign    Worried About Running Out of Food in the Last Year: Never true    Ran Out of Food in the Last Year: Never true  Transportation Needs: No Transportation Needs (09/07/2022)   PRAPARE - Hydrologist (Medical): No    Lack of Transportation (Non-Medical): No  Physical Activity: Not on file  Stress: Not on file  Social Connections: Not on file  Intimate Partner Violence: Not At Risk (09/07/2022)    Humiliation, Afraid, Rape, and Kick questionnaire    Fear of Current or Ex-Partner: No    Emotionally Abused: No    Physically Abused: No    Sexually Abused: No     Family History  Problem Relation Age of Onset   COPD Mother    Diverticulosis Mother    Diverticulosis Father    Diabetes Father    Hypertension Father    Diverticulosis Sister    Stroke Paternal Grandmother     ROS: Otherwise negative unless mentioned in HPI  Physical Examination  Vitals:   09/07/22 2327 09/08/22 0552  BP: 110/74 116/77  Pulse: 95 89  Resp: 16 18  Temp: 99 F (37.2 C) 97.7 F (36.5 C)  SpO2: 98% 100%   Body mass index is 25.72 kg/m.  General:  WDWN in NAD Gait: Not observed HENT: WNL, normocephalic Pulmonary: normal non-labored breathing, without Rales, rhonchi,  wheezing Cardiac: regular, without  Murmurs, rubs or gallops; without carotid bruits Abdomen: Positive bowel sounds, soft, NT/ND, no masses Skin: without rashes Vascular Exam/Pulses: Right lower extremity has +2 palpable PT pulses.  Extremities: without ischemic changes, without Gangrene , without cellulitis; without open wounds;  Musculoskeletal: no muscle wasting or atrophy  Neurologic: A&O X 3;  No focal weakness or paresthesias are detected; speech is fluent/normal Psychiatric:  The pt has Normal affect. Lymph:  Unremarkable  CBC    Component Value Date/Time   WBC 5.3 09/08/2022 0504   RBC 3.13 (L) 09/08/2022 0504   HGB 9.6 (L) 09/08/2022 0504   HGB 11.5 (L) 09/07/2022 1006   HGB 16.7 12/02/2021 1332   HCT 28.1 (L) 09/08/2022 0504   HCT 46.9 12/02/2021 1332   PLT 169 09/08/2022 0504   PLT 213 09/07/2022 1006   PLT 359 12/02/2021 1332   MCV 89.8 09/08/2022 0504   MCV 93 12/02/2021 1332   MCH 30.7 09/08/2022 0504   MCHC 34.2 09/08/2022 0504   RDW 11.9 09/08/2022 0504   RDW 12.5 12/02/2021 1332   LYMPHSABS 0.5 (L) 09/07/2022 1350   LYMPHSABS 2.2 06/10/2020 0855   MONOABS 0.1 09/07/2022 1350   EOSABS 0.1  09/07/2022 1350   EOSABS 0.2 06/10/2020 0855   BASOSABS 0.0 09/07/2022 1350   BASOSABS 0.1 06/10/2020 0855    BMET    Component Value Date/Time   NA 129 (L) 09/08/2022 0504   NA 142 12/02/2021 1332   K 3.1 (L) 09/08/2022 0504   CL 93 (L) 09/08/2022 0504   CO2 27 09/08/2022 0504   GLUCOSE 99 09/08/2022 0504   BUN 20 09/08/2022 0504   BUN 12 12/02/2021 1332   CREATININE 1.26 (H) 09/08/2022 0504   CREATININE 1.20 09/07/2022 1006   CALCIUM 8.1 (L) 09/08/2022 0504   GFRNONAA >60 09/08/2022 0504   GFRNONAA >60 09/07/2022 1006   GFRAA 90 06/10/2020 0855    COAGS: Lab Results  Component Value Date   INR 1.3 (H) 09/07/2022   INR 1.1 08/12/2022   INR 0.9 06/10/2020     Non-Invasive Vascular Imaging:   EXAM: Right LOWER EXTREMITY VENOUS DOPPLER ULTRASOUND  IMPRESSION: Occlusive deep venous thrombosis is noted in the right common femoral and profundus femoral veins as well as the right saphenofemoral junction. The right superficial femoral and popliteal veins are only partially compressible with probable rouleaux flow present. There also appears to be noncompressible thrombus within the visualized portions of the right common and external iliac veins.  Statin:  No. Beta Blocker:  Yes.   Aspirin:  Yes.   ACEI:  No. ARB:  Yes.   CCB use:  No Other antiplatelets/anticoagulants:  No.    ASSESSMENT/PLAN: This is a 67 y.o. male ARSHAUN ROSENGRANT is a 67 y.o. 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  Patient is noted to have an occlusive deep venous thrombosis of the right common femoral and profundus femoral veins as well as the right saphenofemoral junction.  Plan is to go the vascular  lab this afternoon for a thrombectomy of his right lower extremity.  I  discussed in detail with both the patient and his wife the procedure, benefits, risks, and complications.  Both would like to proceed with the procedure sometime today.  Patient has been n.p.o. since midnight last night.  Patient was started on a heparin drip yesterday and that continues this morning.   -Plan was discussed with Dr. Hortencia Pilar MD and he is in agreement with the plan.   Drema Pry [facility] 09/08/2022 7:23 AM

## 2022-09-08 NOTE — Consult Note (Signed)
ANTICOAGULATION CONSULT NOTE - Initial Consult  Pharmacy Consult for heparin infusion Indication: DVT  Allergies  Allergen Reactions   Amoxicillin Itching   Atenolol Diarrhea    Other reaction(s): HIVES   Other Nausea And Vomiting    Navy Beans   Oxycodone     Other reaction(s): HIVES   Shellfish Allergy Itching and Swelling    Patient Measurements: Height: '5\' 9"'$  (175.3 cm) Weight: 79 kg (174 lb 2.6 oz) IBW/kg (Calculated) : 70.7 Heparin Dosing Weight: 79 kg  Vital Signs: Temp: 98.4 F (36.9 C) (02/27 1540) Temp Source: Oral (02/27 1540) BP: 117/74 (02/27 1859) Pulse Rate: 94 (02/27 1859)  Labs: Recent Labs    09/07/22 1006 09/07/22 1348 09/07/22 1350 09/07/22 1428 09/07/22 1856 09/08/22 0019 09/08/22 0504 09/08/22 0701 09/08/22 1332  HGB 11.5*  --  11.5*  --   --   --  9.6*  --   --   HCT 33.3*  --  34.0*  --   --   --  28.1*  --   --   PLT 213  --  232  --   --   --  169  --   --   APTT  --   --   --  28  --   --   --   --   --   LABPROT  --   --   --  15.6*  --   --   --   --   --   INR  --   --   --  1.3*  --   --   --   --   --   HEPARINUNFRC  --   --   --   --   --  0.26*  --  0.34 0.39  CREATININE 1.20  --  1.18  --   --   --  1.26*  --   --   TROPONINIHS  --  17  --   --  20*  --   --   --   --      Estimated Creatinine Clearance: 58.4 mL/min (A) (by C-G formula based on SCr of 1.26 mg/dL (H)).   Medical History: Past Medical History:  Diagnosis Date   Cancer [facility]) 2013   bladder   Diverticulitis    History of chickenpox    History of measles    History of mumps    Hyperlipidemia    Hypertension     Medications:  PTA: N/A Inpatient: heparin infusion (2/26 >>>) Allergies: No AC/APT related allergies  Assessment: 67 yearold male presents to ED with compliant of severe leg pain. US DVT Study on right lower extremity showing Occlusive DVT in the right common femoral and profundus femoral veins as well as the right saphenofemoral  junction. Pharmacy consulted for management of heparin infusion in the setting of DVT.   Date Time aPTT/HL Rate/Comment 2/27     0019      0.26              SUBtherapeutic @ 1400 u/hr 2/27 0701    0.34  Therapeutic x 1   Goal of Therapy:  Heparin level 0.3-0.7 units/ml Monitor platelets by anticoagulation protocol: Yes   Plan:  Resume heparin infusion at 1550 units/hr s/p thrombectomy Recheck heparin level in 6 hours following resuming Continue to monitor H&H and platelets daily while on heparin gtt.  Dorothe Pea, PharmD Clinical Pharmacist 09/08/2022 7:10 PM

## 2022-09-08 NOTE — Consult Note (Signed)
ANTICOAGULATION CONSULT NOTE - Initial Consult  Pharmacy Consult for heparin infusion Indication: DVT  Allergies  Allergen Reactions   Amoxicillin Itching   Atenolol Diarrhea    Other reaction(s): HIVES   Other Nausea And Vomiting    Navy Beans   Oxycodone     Other reaction(s): HIVES   Shellfish Allergy Itching and Swelling    Patient Measurements: Height: '5\' 9"'$  (175.3 cm) Weight: 79 kg (174 lb 2.6 oz) IBW/kg (Calculated) : 70.7 Heparin Dosing Weight: 79 kg  Vital Signs: Temp: 97.7 F (36.5 C) (02/27 0552) Temp Source: Oral (02/27 0552) BP: 116/77 (02/27 0552) Pulse Rate: 89 (02/27 0552)  Labs: Recent Labs    09/07/22 1006 09/07/22 1348 09/07/22 1350 09/07/22 1428 09/07/22 1856 09/08/22 0019 09/08/22 0504 09/08/22 0701  HGB 11.5*  --  11.5*  --   --   --  9.6*  --   HCT 33.3*  --  34.0*  --   --   --  28.1*  --   PLT 213  --  232  --   --   --  169  --   APTT  --   --   --  28  --   --   --   --   LABPROT  --   --   --  15.6*  --   --   --   --   INR  --   --   --  1.3*  --   --   --   --   HEPARINUNFRC  --   --   --   --   --  0.26*  --  0.34  CREATININE 1.20  --  1.18  --   --   --  1.26*  --   TROPONINIHS  --  17  --   --  20*  --   --   --      Estimated Creatinine Clearance: 58.4 mL/min (A) (by C-G formula based on SCr of 1.26 mg/dL (H)).   Medical History: Past Medical History:  Diagnosis Date   Cancer [facility]) 2013   bladder   Diverticulitis    History of chickenpox    History of measles    History of mumps    Hyperlipidemia    Hypertension     Medications:  PTA: N/A Inpatient: heparin infusion (2/26 >>>) Allergies: No AC/APT related allergies  Assessment: 67 yearold male presents to ED with compliant of severe leg pain. US DVT Study on right lower extremity showing Occlusive DVT in the right common femoral and profundus femoral veins as well as the right saphenofemoral junction. Pharmacy consulted for management of heparin infusion in  the setting of DVT.   Date Time aPTT/HL Rate/Comment 2/27     0019      0.26              SUBtherapeutic @ 1400 u/hr 2/27 0701    0.34  Therapeutic x 1  Goal of Therapy:  Heparin level 0.3-0.7 units/ml Monitor platelets by anticoagulation protocol: Yes   Plan: HL therapeutic x 1 Continue heparin drip at 1550 units/hr. Recheck heparin level in 6 hours to confirm Continue to monitor H&H and platelets daily while on heparin gtt.  Lorin Picket, PharmD Clinical Pharmacist 09/08/2022 7:36 AM

## 2022-09-08 NOTE — Consult Note (Signed)
ANTICOAGULATION CONSULT NOTE - Initial Consult  Pharmacy Consult for heparin infusion Indication: DVT  Allergies  Allergen Reactions   Amoxicillin Itching   Atenolol Diarrhea    Other reaction(s): HIVES   Other Nausea And Vomiting    Navy Beans   Oxycodone     Other reaction(s): HIVES   Shellfish Allergy Itching and Swelling    Patient Measurements: Height: '5\' 9"'$  (175.3 cm) Weight: 79 kg (174 lb 2.6 oz) IBW/kg (Calculated) : 70.7 Heparin Dosing Weight: 79 kg  Vital Signs: Temp: 98.4 F (36.9 C) (02/27 0723) Temp Source: Oral (02/27 0723) BP: 123/77 (02/27 1319) Pulse Rate: 86 (02/27 1319)  Labs: Recent Labs    09/07/22 1006 09/07/22 1348 09/07/22 1350 09/07/22 1428 09/07/22 1856 09/08/22 0019 09/08/22 0504 09/08/22 0701 09/08/22 1332  HGB 11.5*  --  11.5*  --   --   --  9.6*  --   --   HCT 33.3*  --  34.0*  --   --   --  28.1*  --   --   PLT 213  --  232  --   --   --  169  --   --   APTT  --   --   --  28  --   --   --   --   --   LABPROT  --   --   --  15.6*  --   --   --   --   --   INR  --   --   --  1.3*  --   --   --   --   --   HEPARINUNFRC  --   --   --   --   --  0.26*  --  0.34 0.39  CREATININE 1.20  --  1.18  --   --   --  1.26*  --   --   TROPONINIHS  --  17  --   --  20*  --   --   --   --      Estimated Creatinine Clearance: 58.4 mL/min (A) (by C-G formula based on SCr of 1.26 mg/dL (H)).   Medical History: Past Medical History:  Diagnosis Date   Cancer [facility]) 2013   bladder   Diverticulitis    History of chickenpox    History of measles    History of mumps    Hyperlipidemia    Hypertension     Medications:  PTA: N/A Inpatient: heparin infusion (2/26 >>>) Allergies: No AC/APT related allergies  Assessment: 67 yearold male presents to ED with compliant of severe leg pain. US DVT Study on right lower extremity showing Occlusive DVT in the right common femoral and profundus femoral veins as well as the right saphenofemoral  junction. Pharmacy consulted for management of heparin infusion in the setting of DVT.   Date Time aPTT/HL Rate/Comment 2/27     0019      0.26              SUBtherapeutic @ 1400 u/hr 2/27 0701    0.34  Therapeutic x 1 2/27 1332    0.39  Therapeutic x 2  Goal of Therapy:  Heparin level 0.3-0.7 units/ml Monitor platelets by anticoagulation protocol: Yes   Plan: HL therapeutic x 2 Continue heparin drip at 1550 units/hr. Recheck heparin level 2/28 with am labs. Continue to monitor H&H and platelets daily while on heparin gtt.  Lorin Picket,  PharmD Clinical Pharmacist 09/08/2022 2:01 PM

## 2022-09-08 NOTE — Progress Notes (Signed)
PROGRESS NOTE    Jeffrey Fernandez  [id number] DOB: [birth year] DOA: 09/07/2022 PCP: Birdie Sons, MD    Brief Narrative:  67 y.o. 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  Patient endorses poor p.o. intake since chemotherapy last week.  Progressive fatigue.   Patient noted his right lower extremity to be edematous and painful with decreased range of motion.  Sent from cancer center to emergency room.  In the ED found to have extensive lower extremity DVT extending into multiple veins.   On my evaluation patient appears fatigued and weak otherwise stable.  He endorses pain in right lower extremity.  No other complaints noted at time my evaluation.  Blood pressure low normal otherwise stable.  Normal heart rate.  No oxygen requirement.   Assessment & Plan:   Principal Problem:   DVT (deep venous thrombosis) (HCC) Active Problems:   Essential (primary) hypertension   Personal history of bladder cancer   Gastroesophageal reflux disease   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[place])  DVT RLL PE Patient presented with asymmetric right lower extremity swelling.  Ultrasound confirmed DVT on presentation.  CT angio ordered after patient was admitted.  Right lower lobe PE noted.  I was never called about this pulmonary embolism.  Radiology notes indicate that it was called out to EDP at 2 PM 2/26 however the study was not completed until after 4 PM. Plan: Continue heparin GTT Vascular planning thrombectomy for right lower extremity today As needed pain control NPO pending procedure  Near syncope Hypotension Likely in the setting of poor p.o. intake/anorexia Continue intravenous fluids Fall precautions   Stage IV urothelial carcinoma Seen outpatient by Dr. Grayland Ormond and Altha Harm  nurse practitioner palliative care Notified Altha Harm of patient's admission CT angio thorax demonstrates progression of underlying metastatic disease   Essential hypertension BP meds on hold   BPH PTA Flomax   Intractable nausea and vomiting As needed Zofran Daily Protonix     DVT prophylaxis: Heparin GTT Code Status: Full code Family Communication: Family member at bedside 2/27 Disposition Plan: Status is: Observation The patient will require care spanning > 2 midnights and should be moved to inpatient because: PE/DVT on heparin gtt.  Thrombectomy today.   Level of care: Med-Surg  Consultants:  Vascular surgery  Procedures:  Vascular thrombectomy  Antimicrobials: None   Subjective: Seen and examined.  Reports uncomfortable night due to pain and swelling of right lower extremity  Objective: Vitals:   09/07/22 1943 09/07/22 2327 09/08/22 0552 09/08/22 0723  BP: 102/70 110/74 116/77 113/81  Pulse: 100 95 89 95  Resp: '15 16 18 18  '$ Temp: 98.3 F (36.8 C) 99 F (37.2 C) 97.7 F (36.5 C) 98.4 F (36.9 C)  TempSrc: Oral  Oral Oral  SpO2: 99% 98% 100% 97%  Weight:      Height:        Intake/Output Summary (Last 24 hours) at 09/08/2022 1027 Last data filed at 09/07/2022 1554 Gross per 24 hour  Intake 1000 ml  Output --  Net 1000 ml   Filed Weights   09/07/22 1344  Weight: 79 kg    Examination:  General exam: Appears calm and comfortable  Respiratory system: Clear to auscultation. Respiratory effort normal. Cardiovascular system: S1-S2, RRR, no murmurs, no pedal edema Gastrointestinal system: Soft, NT/ND, normal bowel sounds  Central nervous system: Alert and oriented. No focal neurological deficits. Extremities: Right lower extremity swollen, tender to touch, decreased ROM Skin: No rashes, lesions or ulcers Psychiatry: Judgement and insight appear normal. Mood & affect appropriate.     Data Reviewed: I have personally reviewed following labs  and imaging studies  CBC: Recent Labs  Lab 09/03/22 0917 09/07/22 1006 09/07/22 1350 09/08/22 0504  WBC 11.7* 9.2 9.5 5.3  NEUTROABS 9.3* 7.9* 8.8*  --   HGB 13.3 11.5* 11.5* 9.6*  HCT 38.8* 33.3* 34.0* 28.1*  MCV 89.6 88.8 90.2 89.8  PLT 200 213 232 [id number]   Basic Metabolic Panel: Recent Labs  Lab 09/03/22 0917 09/07/22 1006 09/07/22 1350 09/08/22 0504  NA 129* 124* 125* 129*  K 3.7 3.1* 4.5 3.1*  CL 91* 86* 89* 93*  CO2 '28 25 26 27  '$ GLUCOSE 114* 121* 156* 99  BUN '22 17 18 20  '$ CREATININE 1.40* 1.20 1.18 1.26*  CALCIUM 9.4 8.7* 8.5* 8.1*  MG 2.2 2.2  --   --    GFR: Estimated Creatinine Clearance: 58.4 mL/min (A) (by C-G formula based on SCr of 1.26 mg/dL (H)). Liver Function Tests: Recent Labs  Lab 09/03/22 0917 09/07/22 1006 09/07/22 1350  AST 34 22 23  ALT 67* 32 34  ALKPHOS 173* 132* 134*  BILITOT 1.1 0.9 1.2  PROT 7.5 7.1 7.2  ALBUMIN 3.4* 3.1* 3.1*   No results for input(s): "LIPASE", "AMYLASE" in the last 168 hours. No results for input(s): "AMMONIA" in the last 168 hours. Coagulation Profile: Recent Labs  Lab 09/07/22 1428  INR 1.3*   Cardiac Enzymes: No results for input(s): "CKTOTAL", "CKMB", "CKMBINDEX", "TROPONINI" in the last 168 hours. BNP (last 3 results) No results for input(s): "PROBNP" in the last 8760 hours. HbA1C: No results for input(s): "HGBA1C" in the last 72 hours. CBG: No results for input(s): "GLUCAP" in the last 168 hours. Lipid Profile: No results for input(s): "CHOL", "HDL", "LDLCALC", "TRIG", "CHOLHDL", "LDLDIRECT" in the last 72 hours. Thyroid Function Tests: No results for input(s): "TSH", "T4TOTAL", "FREET4", "T3FREE", "THYROIDAB" in the last 72 hours. Anemia Panel: No results for input(s): "VITAMINB12", "FOLATE", "FERRITIN", "TIBC", "IRON", "RETICCTPCT" in the last 72 hours. Sepsis Labs: Recent Labs  Lab 09/07/22 1350  LATICACIDVEN 1.8    Recent Results (from the past 240 hour(s))  Urine culture     Status:  None   Collection Time: 08/31/22 12:07 PM   Specimen: Urine, Clean Catch  Result Value Ref Range Status   Specimen Description   Final    URINE, CLEAN CATCH Performed at [facility], [address] [place], [place] [address]    Special Requests   Final    NONE Performed at [facility], [address] [place], [place] [address]    Culture   Final    NO GROWTH Performed at [facility], [place] [address], [place], [place] [address]    Report Status 09/01/2022 FINAL  Final  Blood Culture (routine x 2)     Status: None (Preliminary result)   Collection Time: 09/07/22  1:59 PM   Specimen: BLOOD  Result Value Ref Range Status   Specimen Description BLOOD BLOOD LEFT WRIST  Final   Special Requests   Final    BOTTLES DRAWN AEROBIC AND ANAEROBIC Blood Culture adequate volume   Culture   Final    NO GROWTH < 12 HOURS Performed at [facility], [address], [place], [place] [address]  Report Status PENDING  Incomplete  Resp panel by RT-PCR (RSV, Flu A&B, Covid) Anterior Nasal Swab     Status: None   Collection Time: 09/07/22  2:28 PM   Specimen: Anterior Nasal Swab  Result Value Ref Range Status   SARS Coronavirus 2 by RT PCR NEGATIVE NEGATIVE Final    Comment: (NOTE) SARS-CoV-2 target nucleic acids are NOT DETECTED.  The SARS-CoV-2 RNA is generally detectable in upper respiratory specimens during the acute phase of infection. The lowest concentration of SARS-CoV-2 viral copies this assay can detect is 138 copies/mL. A negative result does not preclude SARS-Cov-2 infection and should not be used as the sole basis for treatment or other patient management decisions. A negative result may occur with  improper specimen collection/handling, submission of specimen other than nasopharyngeal swab, presence of viral mutation(s) within the areas targeted by this assay, and inadequate number of viral copies(<138 copies/mL). A negative result must be  combined with clinical observations, patient history, and epidemiological information. The expected result is Negative.  Fact Sheet for Patients:  EntrepreneurPulse.com.au  Fact Sheet for Healthcare Providers:  IncredibleEmployment.be  This test is no t yet approved or cleared by the [place] FDA and  has been authorized for detection and/or diagnosis of SARS-CoV-2 by FDA under an Emergency Use Authorization (EUA). This EUA will remain  in effect (meaning this test can be used) for the duration of the COVID-19 declaration under Section 564(b)(1) of the Act, 21 U.S.C.section 360bbb-3(b)(1), unless the authorization is terminated  or revoked sooner.       Influenza A by PCR NEGATIVE NEGATIVE Final   Influenza B by PCR NEGATIVE NEGATIVE Final    Comment: (NOTE) The Xpert Xpress SARS-CoV-2/FLU/RSV plus assay is intended as an aid in the diagnosis of influenza from Nasopharyngeal swab specimens and should not be used as a sole basis for treatment. Nasal washings and aspirates are unacceptable for Xpert Xpress SARS-CoV-2/FLU/RSV testing.  Fact Sheet for Patients: EntrepreneurPulse.com.au  Fact Sheet for Healthcare Providers: IncredibleEmployment.be  This test is not yet approved or cleared by the [place] FDA and has been authorized for detection and/or diagnosis of SARS-CoV-2 by FDA under an Emergency Use Authorization (EUA). This EUA will remain in effect (meaning this test can be used) for the duration of the COVID-19 declaration under Section 564(b)(1) of the Act, 21 U.S.C. section 360bbb-3(b)(1), unless the authorization is terminated or revoked.     Resp Syncytial Virus by PCR NEGATIVE NEGATIVE Final    Comment: (NOTE) Fact Sheet for Patients: EntrepreneurPulse.com.au  Fact Sheet for Healthcare Providers: IncredibleEmployment.be  This test is not yet  approved or cleared by the [place] FDA and has been authorized for detection and/or diagnosis of SARS-CoV-2 by FDA under an Emergency Use Authorization (EUA). This EUA will remain in effect (meaning this test can be used) for the duration of the COVID-19 declaration under Section 564(b)(1) of the Act, 21 U.S.C. section 360bbb-3(b)(1), unless the authorization is terminated or revoked.  Performed at [facility], [address] [place], [place] [address]   Blood Culture (routine x 2)     Status: None (Preliminary result)   Collection Time: 09/07/22  2:29 PM   Specimen: BLOOD  Result Value Ref Range Status   Specimen Description BLOOD RIGHT ANTECUBITAL  Final   Special Requests   Final    BOTTLES DRAWN AEROBIC AND ANAEROBIC Blood Culture adequate volume   Culture   Final    NO GROWTH < 12 HOURS Performed  at [facility], [address], [place], [place] [address]    Report Status PENDING  Incomplete         Radiology Studies: CT Angio Chest PE W and/or Wo Contrast  Result Date: 09/07/2022 CLINICAL DATA:  Stage IV ureteral carcinoma. Hypotension and syncope. * Tracking Code: BO * EXAM: CT ANGIOGRAPHY CHEST WITH CONTRAST TECHNIQUE: Multidetector CT imaging of the chest was performed using the standard protocol during bolus administration of intravenous contrast. Multiplanar CT image reconstructions and MIPs were obtained to evaluate the vascular anatomy. RADIATION DOSE REDUCTION: This exam was performed according to the departmental dose-optimization program which includes automated exposure control, adjustment of the mA and/or kV according to patient size and/or use of iterative reconstruction technique. CONTRAST:  176m OMNIPAQUE IOHEXOL 350 MG/ML SOLN COMPARISON:  PET-CT 08/14/2022.  Chest CT 08/05/2022 noncontrast FINDINGS: Cardiovascular: Heart is nonenlarged. Trace pericardial fluid. Right upper chest port in place. The port is accessed. In the right lower  lobe is a filling defect along the medial branch of the right lower lobe pulmonary artery consistent with a subsegmental pulmonary embolism. No other larger and more central emboli are identified. Mediastinum/Nodes: No specific abnormal lymph node enlargement identified in the axillary regions. There are several abnormal mediastinal and hilar lymph nodes once again identified. These are increased from the study of 08/05/2022. Example to the left of the main pulmonary artery on series 4, image 67 measures 2.3 x 1.7 cm. Right hilar node anteriorly on series 4, image 68 measures 15 by 12 mm. Left hilar node on image 80 of series 4 measures 16 by 11 mm. Several other nodes are identified. There are also some prominent nodes along the thoracic inlet on the left side. All of these are also larger than the PET-CT scan of 08/14/22. Normal caliber thoracic esophagus. Lungs/Pleura: No pneumothorax or consolidation. Trace right-sided pleural fluid. Once again there are numerous bilateral lung nodules of varying size but the vast majority are under a cm. At least 15 nodules are identified. There is 1 lesion along the right lower lobe on image 95 of series 6 which is cavitary measuring 2.3 by 1.9 cm. Example nodule in the left lung, lower lobe on series 6, image 97 measures 12 by 10 mm. All of these are increased in size and number. Upper Abdomen: There are multiple perinephric space nodules involving the right kidney as well as poor visualization of the parenchyma of the right kidney consistent with known mass lesions. There is also nodular enlargement of the adrenal glands which also appears to have increased. Musculoskeletal: Mild degenerative changes along the spine. Review of the MIP images confirms the above findings. Critical Value/emergent results were called by telephone at the time of interpretation on 09/07/2022 at 2:07 pm to provider Dr. SJoni Fears who verbally acknowledged these results. IMPRESSION: Progression of  metastatic disease. This includes lung nodules, mediastinal lymph nodes and upper abdominal lesions. Right lower lobe pulmonary emboli. Please see separate dictation of abdomen and pelvis CT from same day Electronically Signed   By: AJill SideM.D.   On: 09/07/2022 17:21   CT ABDOMEN PELVIS W CONTRAST  Result Date: 09/07/2022 CLINICAL DATA:  Epigastric pain again leg swelling. 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 * Tracking Code: BO * EXAM: CT ABDOMEN AND PELVIS WITH CONTRAST TECHNIQUE: Multidetector CT imaging of the abdomen and pelvis was performed using the standard protocol following bolus administration of intravenous contrast. RADIATION DOSE REDUCTION: This exam was performed according to the  departmental dose-optimization program which includes automated exposure control, adjustment of the mA and/or kV according to patient size and/or use of iterative reconstruction technique. CONTRAST:  169m OMNIPAQUE IOHEXOL 350 MG/ML SOLN COMPARISON:  PET-CT 08/14/2022.  Standard CT 07/31/2022 FINDINGS: Lower chest: Please see separate dictation of chest CT angiogram from same date Hepatobiliary: Liver metastases are noted. Central lesion in the right hepatic lobe with some peripheral segmental biliary duct ectasia is now identified on series 2, image 15 measuring 2.2 x 1.6 cm. The duct dilatation is likely related to secondary mass effect. Small lesions are also seen peripherally in the right hepatic lobe on image 17 and 21 in segment 4. Portal vein is patent. Gallbladder is nondilated. Pancreas: Unremarkable. No pancreatic ductal dilatation or surrounding inflammatory changes. Spleen: Normal in size without focal abnormality. Adrenals/Urinary Tract: Increasing left adrenal nodule. On image 26 of series 2 this measures 17 x 15 mm. There is also an increasing right adrenal metastasis. Left kidney has some small Bosniak 1 cysts, unchanged from prior. No left-sided renal collecting system  dilatation. Left ureter has a normal course and caliber down to the bladder. Preserved contours of the urinary bladder. There is large infiltrative mass involving the right kidney along its posterior aspect. Previously this measured 6.0 x 4.6 cm and on the current examination 6.5 by 4.4 cm. Once again there is ill-defined margins extending into the renal hilum collecting system. In addition there is marked increase in the number and extent of perinephric space nodules diffusely. Example superiorly on series 28 of series 2 measuring 15 by 14 mm. Previously this measured less than 5 mm. There is significant retroperitoneal stranding identified which is significantly increased from prior. Stomach/Bowel: Large bowel has a normal course and caliber. Scattered diverticula. No smaller large bowel dilatation. The stomach is underdistended. Vascular/Lymphatic: Normal caliber aorta with scattered vascular calcifications. There is significant increase in retroperitoneal abnormal lymph nodes. Example retrocaval on series 2 image 37 measures 4.7 x 2.7 cm. On the patient's prior PET-CT this measured 4.0 x 2.2 cm. There is several others which have increased as well and now cause significant compression of the IVC. Another example is a left para-aortic node which previously would have had dimensions of 11 mm in transverse and today 14 mm on series 2, image 36. Of note caudal to the area of narrowing of the IVC the veins are distended all the way into the area of the lower extremities. There is also edema of the right lower extremity. Please correlate with separate ultrasound of the legs for DVT from same day. Of note, 1 of the retrocaval mass lesions identified does appear to cause deformity of the adjacent vertebral body at L2 and extend into the vertebral body itself. Reproductive: Enlarged prostate with mass effect along the base of the bladder. Other: Anasarca.  Nonspecific presacral stranding. Musculoskeletal: Degenerative  changes of the spine and pelvis. Pars defects again L5. Once again there is invasion of a retroperitoneal node into the margin of the vertebral body at L2 best seen on sagittal image 54 and axial image 38. Again new from previous examinations. Critical Value/emergent results were called by telephone at the time of interpretation on 09/07/2022 at 2:19 pm to provider Dr. SJoni Fears who verbally acknowledged these results. IMPRESSION: Marked increase of neoplasm metastatic disease including size of the primary right renal mass. Significant increase in size number perinephric nodules, bilateral adrenal masses, retroperitoneal nodes and liver lesions. The retroperitoneal lymphadenopathy does cause severe narrowing of the IVC  focally and associated distention of caudal veins including the pelvis. Please correlate with separate DVT ultrasound. In addition 1 of the retroperitoneal nodes does appear to extend directly into and invade the L2 vertebral body. No bowel obstruction or free air. Scattered anasarca including along the retroperitoneum and presacral regions. Electronically Signed   By: Jill Side M.D.   On: 09/07/2022 17:21   US Venous Img Lower Right (DVT Study)  Result Date: 09/07/2022 CLINICAL DATA:  Acute right lower extremity pain and edema. EXAM: Right LOWER EXTREMITY VENOUS DOPPLER ULTRASOUND TECHNIQUE: Gray-scale sonography with graded compression, as well as color Doppler and duplex ultrasound were performed to evaluate the lower extremity deep venous systems from the level of the common femoral vein and including the common femoral, femoral, profunda femoral, popliteal and calf veins including the posterior tibial, peroneal and gastrocnemius veins when visible. The superficial great saphenous vein was also interrogated. Spectral Doppler was utilized to evaluate flow at rest and with distal augmentation maneuvers in the common femoral, femoral and popliteal veins. COMPARISON:  None Available. FINDINGS:  Contralateral Common Femoral Vein: Respiratory phasicity is normal and symmetric with the symptomatic side. No evidence of thrombus. Normal compressibility. Common Femoral Vein: Noncompressible with no flow consistent with occlusive thrombus. Saphenofemoral Junction: Noncompressible with no flow consistent with occlusive thrombus. Profunda Femoral Vein: Noncompressible with no flow consistent with occlusive thrombus. Femoral Vein: Only partially compressible with possible rouleaux flow present. Popliteal Vein: Only partially compressible with possible rouleaux low flow present. Calf Veins: No evidence of thrombus. Normal compressibility and flow on color Doppler imaging. Superficial Great Saphenous Vein: No evidence of thrombus. Normal compressibility. Venous Reflux:  None. Other Findings: There also appears to be noncompressible thrombus in the visualized portions of the right common and external iliac veins. IMPRESSION: Occlusive deep venous thrombosis is noted in the right common femoral and profundus femoral veins as well as the right saphenofemoral junction. The right superficial femoral and popliteal veins are only partially compressible with probable rouleaux flow present. There also appears to be noncompressible thrombus within the visualized portions of the right common and external iliac veins. Electronically Signed   By: Marijo Conception M.D.   On: 09/07/2022 15:46   DG Chest Port 1 View  Result Date: 09/07/2022 CLINICAL DATA:  Provided history: Questionable sepsis. Evaluate for abnormality. EXAM: PORTABLE CHEST 1 VIEW COMPARISON:  PET-CT 08/14/2022. FINDINGS: Right chest infusion port catheter with tip at the level of the superior cavoatrial junction. Heart size within normal limits. Numerous bilateral pulmonary nodules consistent with known metastatic disease (as demonstrated on the recent prior PET-CT of 08/14/2022). No appreciable superimposed acute airspace consolidation. No evidence of pleural  effusion or pneumothorax. No acute bony abnormality identified. Levocurvature of the thoracic spine. IMPRESSION: Numerous bilateral pulmonary nodules consistent with known metastatic disease (as demonstrated on the recent prior PET-CT of 08/14/2022). No appreciable superimposed acute airspace consolidation. Electronically Signed   By: Kellie Simmering D.O.   On: 09/07/2022 14:23        Scheduled Meds:  Chlorhexidine Gluconate Cloth  6 each Topical Daily   pantoprazole  40 mg Oral Daily   tamsulosin  0.4 mg Oral Daily   Continuous Infusions:  sodium chloride     heparin 1,550 Units/hr (09/08/22 [id number])     LOS: 0 days     Sidney Ace, MD [facility]   If 7PM-7AM, please contact night-coverage  09/08/2022, 10:27 AM

## 2022-09-08 NOTE — Consult Note (Signed)
Hospital Consult    Reason for Consult:  right Lower extremity DVT Requesting Physician:  Dr. Ralene Muskrat MD MRN #:  [id number]  History of Present Illness: Jeffrey Fernandez is a 67 y.o. 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  Patient endorses poor p.o. intake since chemotherapy last week.  Progressive fatigue.   Patient noted his right lower extremity to be edematous and painful with decreased range of motion.  Sent from cancer center to emergency room.  In the ED found to have extensive lower extremity DVT extending into multiple veins.  On my exam this morning the patient is resting comfortably in bed with his wife at the bedside. He states he has continuous pain in his right lower extremity. He also states he is continuously nauseated and has no appetite. Wife states he does not drink enough fluids all day and because of his fatigue he does not ambulate like he should.   Past Medical History:  Diagnosis Date   Cancer [facility]) 2013   bladder   Diverticulitis    History of chickenpox    History of measles    History of mumps    Hyperlipidemia    Hypertension     Past Surgical History:  Procedure Laterality Date   HERNIA REPAIR  2002,2003   IR IMAGING GUIDED PORT INSERTION  08/25/2022   SIGMOIDOSCOPY  1998   [facility]   TRANSURETHRAL RESECTION OF BLADDER TUMOR  05/11/2012   Dr. Sherral Hammers, [place]  2002   Bilateral inguinal herniorrhaphies    Allergies  Allergen Reactions   Amoxicillin Itching   Atenolol Diarrhea    Other reaction(s): HIVES   Other Nausea And Vomiting    [employer]   Oxycodone     Other reaction(s): HIVES   Shellfish Allergy Itching and Swelling    Prior to Admission medications   Medication Sig Start Date End  Date Taking? Authorizing Provider  acetaminophen (TYLENOL) 500 MG tablet Take 500 mg by mouth every 6 (six) hours as needed.   Yes [provider]  aspirin 81 MG tablet Take 81 mg by mouth daily.   Yes [provider]  chlorproMAZINE (THORAZINE) 10 MG tablet Take 1 tablet (10 mg total) by mouth 3 (three) times daily as needed for hiccoughs or nausea. Do not take when taking Zofran/ondansetron. 09/01/22  Yes Verlon Au, NP  docusate sodium (COLACE) 250 MG capsule Take 250 mg by mouth daily.   Yes [provider]  fentaNYL (DURAGESIC) 25 MCG/HR Place 1 patch onto the skin every 3 (three) days. 08/19/22  Yes Lloyd Huger, MD  HYDROcodone-acetaminophen (NORCO) 5-325 MG tablet Take 1 tablet by mouth every 6 (six) hours as needed for moderate pain. 08/05/22  Yes Lloyd Huger, MD  lidocaine-prilocaine (EMLA) cream Apply to affected area once 08/19/22  Yes Finnegan, Kathlene November, MD  losartan-hydrochlorothiazide (HYZAAR) 100-12.5 MG tablet TAKE 1 TABLET BY MOUTH EVERY DAY 07/29/22  Yes Birdie Sons, MD  metoprolol succinate (TOPROL-XL) 25 MG 24 hr tablet TAKE 1 TABLET (25 MG TOTAL) BY MOUTH DAILY. 01/29/22  Yes Birdie Sons, MD  ondansetron (ZOFRAN) 8 MG tablet Take 1 tablet (8 mg total) by mouth every 8 (eight) hours as needed for nausea or vomiting. 08/21/22  Yes Borders, Kirt Boys,  NP  ondansetron (ZOFRAN-ODT) 8 MG disintegrating tablet Take 8 mg by mouth every 8 (eight) hours as needed. 08/21/22  Yes [provider]  pantoprazole (PROTONIX) 40 MG tablet TAKE 1 TABLET BY MOUTH EVERY DAY 01/29/22  Yes Birdie Sons, MD  prochlorperazine (COMPAZINE) 10 MG tablet Take 1 tablet (10 mg total) by mouth every 6 (six) hours as needed (Nausea or vomiting). 08/19/22  Yes Lloyd Huger, MD  sildenafil (REVATIO) 20 MG tablet Take 1-3 tablets (20-60 mg total) by mouth daily as needed. 12/24/21  Yes Billey Co, MD  tamsulosin (FLOMAX) 0.4 MG CAPS capsule Take 1  capsule (0.4 mg total) by mouth daily. 07/28/22  Yes Billey Co, MD  Multiple Vitamins-Minerals (OCUVITE ADULT FORMULA PO) Take 1 capsule by mouth. Patient not taking: Reported on 09/07/2022    [provider]  multivitamin-lutein [facility]) CAPS capsule Take 1 capsule by mouth daily. Patient not taking: Reported on 09/07/2022    [provider]  Specialty Vitamins Products ([employer] PROSTATE HEALTH FORMULA PO)  11/28/21   [provider]    Social History   Socioeconomic History   Marital status: Married    Spouse name: Jeffrey Fernandez   Number of children: 3   Years of education: Not on file   Highest education level: Not on file  Occupational History   Occupation: Truck Geophysicist/field seismologist  Tobacco Use   Smoking status: Former    Packs/day: 2.00    Years: 39.00    Total pack years: 78.00    Types: Cigarettes    Quit date: 07/14/2011    Years since quitting: 11.1    Passive exposure: Past   Smokeless tobacco: Never   Tobacco comments:    smoked 1 to 1 1/2  ppd for 35 years  Vaping Use   Vaping Use: Never used  Substance and Sexual Activity   Alcohol use: Yes    Comment: drinks 2 beers daily: none since 1 week ago   Drug use: No   Sexual activity: Not on file  Other Topics Concern   Not on file  Social History Narrative   Lives at home with wife , daughter , 3 grand daughters, son & his 2 kids    Social Determinants of Health   Financial Resource Strain: Not on file  Food Insecurity: No Food Insecurity (09/07/2022)   Hunger Vital Sign    Worried About Running Out of Food in the Last Year: Never true    Ran Out of Food in the Last Year: Never true  Transportation Needs: No Transportation Needs (09/07/2022)   PRAPARE - Hydrologist (Medical): No    Lack of Transportation (Non-Medical): No  Physical Activity: Not on file  Stress: Not on file  Social Connections: Not on file  Intimate Partner Violence: Not At Risk (09/07/2022)    Humiliation, Afraid, Rape, and Kick questionnaire    Fear of Current or Ex-Partner: No    Emotionally Abused: No    Physically Abused: No    Sexually Abused: No     Family History  Problem Relation Age of Onset   COPD Mother    Diverticulosis Mother    Diverticulosis Father    Diabetes Father    Hypertension Father    Diverticulosis Sister    Stroke Paternal Grandmother     ROS: Otherwise negative unless mentioned in HPI  Physical Examination  Vitals:   09/07/22 2327 09/08/22 0552  BP: 110/74 116/77  Pulse: 95 89  Resp: 16 18  Temp: 99 F (37.2 C) 97.7 F (36.5 C)  SpO2: 98% 100%   Body mass index is 25.72 kg/m.  General:  WDWN in NAD Gait: Not observed HENT: WNL, normocephalic Pulmonary: normal non-labored breathing, without Rales, rhonchi,  wheezing Cardiac: regular, without  Murmurs, rubs or gallops; without carotid bruits Abdomen: Positive bowel sounds, soft, NT/ND, no masses Skin: without rashes Vascular Exam/Pulses: Right lower extremity has +2 palpable PT pulses.  Extremities: without ischemic changes, without Gangrene , without cellulitis; without open wounds;  Musculoskeletal: no muscle wasting or atrophy  Neurologic: A&O X 3;  No focal weakness or paresthesias are detected; speech is fluent/normal Psychiatric:  The pt has Normal affect. Lymph:  Unremarkable  CBC    Component Value Date/Time   WBC 5.3 09/08/2022 0504   RBC 3.13 (L) 09/08/2022 0504   HGB 9.6 (L) 09/08/2022 0504   HGB 11.5 (L) 09/07/2022 1006   HGB 16.7 12/02/2021 1332   HCT 28.1 (L) 09/08/2022 0504   HCT 46.9 12/02/2021 1332   PLT 169 09/08/2022 0504   PLT 213 09/07/2022 1006   PLT 359 12/02/2021 1332   MCV 89.8 09/08/2022 0504   MCV 93 12/02/2021 1332   MCH 30.7 09/08/2022 0504   MCHC 34.2 09/08/2022 0504   RDW 11.9 09/08/2022 0504   RDW 12.5 12/02/2021 1332   LYMPHSABS 0.5 (L) 09/07/2022 1350   LYMPHSABS 2.2 06/10/2020 0855   MONOABS 0.1 09/07/2022 1350   EOSABS 0.1  09/07/2022 1350   EOSABS 0.2 06/10/2020 0855   BASOSABS 0.0 09/07/2022 1350   BASOSABS 0.1 06/10/2020 0855    BMET    Component Value Date/Time   NA 129 (L) 09/08/2022 0504   NA 142 12/02/2021 1332   K 3.1 (L) 09/08/2022 0504   CL 93 (L) 09/08/2022 0504   CO2 27 09/08/2022 0504   GLUCOSE 99 09/08/2022 0504   BUN 20 09/08/2022 0504   BUN 12 12/02/2021 1332   CREATININE 1.26 (H) 09/08/2022 0504   CREATININE 1.20 09/07/2022 1006   CALCIUM 8.1 (L) 09/08/2022 0504   GFRNONAA >60 09/08/2022 0504   GFRNONAA >60 09/07/2022 1006   GFRAA 90 06/10/2020 0855    COAGS: Lab Results  Component Value Date   INR 1.3 (H) 09/07/2022   INR 1.1 08/12/2022   INR 0.9 06/10/2020     Non-Invasive Vascular Imaging:   EXAM: Right LOWER EXTREMITY VENOUS DOPPLER ULTRASOUND  IMPRESSION: Occlusive deep venous thrombosis is noted in the right common femoral and profundus femoral veins as well as the right saphenofemoral junction. The right superficial femoral and popliteal veins are only partially compressible with probable rouleaux flow present. There also appears to be noncompressible thrombus within the visualized portions of the right common and external iliac veins.  Statin:  No. Beta Blocker:  Yes.   Aspirin:  Yes.   ACEI:  No. ARB:  Yes.   CCB use:  No Other antiplatelets/anticoagulants:  No.    ASSESSMENT/PLAN: This is a 67 y.o. male KASEY BRENNEMAN is a 67 y.o. 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  Patient is noted to have an occlusive deep venous thrombosis of the right common femoral and profundus femoral veins as well as the right saphenofemoral junction.  Plan is to go the vascular  lab this afternoon for a thrombectomy of his right lower extremity.  I  discussed in detail with both the patient and his wife the procedure, benefits, risks, and complications.  Both would like to proceed with the procedure sometime today.  Patient has been n.p.o. since midnight last night.  Patient was started on a heparin drip yesterday and that continues this morning.   -Plan was discussed with Dr. Hortencia Pilar MD and he is in agreement with the plan.   Drema Pry [facility] 09/08/2022 7:23 AM

## 2022-09-08 NOTE — Interval H&P Note (Signed)
History and Physical Interval Note:  09/08/2022 4:49 PM  Jeffrey Fernandez  has presented today for surgery, with the diagnosis of Right Lower extremity Thrombus.  The various methods of treatment have been discussed with the patient and family. After consideration of risks, benefits and other options for treatment, the patient has consented to  Procedure(s): PERIPHERAL VASCULAR THROMBECTOMY (Right) as a surgical intervention.  The patient's history has been reviewed, patient examined, no change in status, stable for surgery.  I have reviewed the patient's chart and labs.  Questions were answered to the patient's satisfaction.     Hortencia Pilar

## 2022-09-08 NOTE — Consult Note (Signed)
[employer] at [facility] [phone] [phone]   Name: Jeffrey Fernandez Date: 09/08/2022 MRN: [id number]  DOB: [birth year]  Patient Care Team: Birdie Sons, MD as PCP - General (Family Medicine) Billey Co, MD as Consulting Physician (Urology) Lloyd Huger, MD as Consulting Physician (Oncology)    REASON FOR CONSULTATION: Jeffrey Fernandez is a 67 y.o. 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  PET scan on 08/14/2022 shows hypermetabolic kidney mass with retroperitoneal lymphadenopathy and multiple bilateral pulmonary nodules.  Patient is status post 1 cycle of cisplatin and gemcitabine with overall poor tolerance.  He was sent to the emergency department from oncology clinic on 09/07/2022 with hypotension and RLE edema and found to have extensive lower extremity DVT and right lower lobe PE.  SOCIAL HISTORY:     reports that he quit smoking about 11 years ago. His smoking use included cigarettes. He has a 78.00 pack-year smoking history. He has been exposed to tobacco smoke. He has never used smokeless tobacco. He reports current alcohol use. He reports that he does not use drugs.  Patient is married and lives at home with his wife  ADVANCE DIRECTIVES:  Not on file  CODE STATUS: Full code  PAST MEDICAL HISTORY: Past Medical History:  Diagnosis Date   Cancer ([place]) 2013   bladder   Diverticulitis    History of chickenpox    History of measles    History of mumps    Hyperlipidemia    Hypertension     PAST SURGICAL HISTORY:  Past Surgical History:  Procedure Laterality Date   HERNIA REPAIR  2002,2003   IR IMAGING GUIDED PORT INSERTION  08/25/2022   SIGMOIDOSCOPY  1998   [facility]   TRANSURETHRAL RESECTION OF BLADDER TUMOR  05/11/2012   Dr. Sherral Hammers, [facility]   VASECTOMY  2002   Bilateral inguinal herniorrhaphies     HEMATOLOGY/ONCOLOGY HISTORY:  Oncology History  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[place])  08/19/2022 Initial Diagnosis   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[place])   08/19/2022 Cancer Staging   Staging form: Kidney, AJCC 8th Edition - Clinical stage from 08/19/2022: Stage IV (cT4, cN1, cM1) - Signed by Lloyd Huger, MD on 08/19/2022   08/27/2022 -  Chemotherapy   Patient is on Treatment Plan : Urothelial carcinoma of kidney: Cisplatin D1 + Gemcitabine D1,8 q21d x 6 Cycles       ALLERGIES:  is allergic to amoxicillin, atenolol, other, oxycodone, and shellfish allergy.  MEDICATIONS:  Current Facility-Administered Medications  Medication Dose Route Frequency Provider Last Rate Last Admin   0.9 %  sodium chloride infusion   Intravenous Continuous Sreenath, Sudheer B, MD       0.9 %  sodium chloride infusion   Intravenous Continuous Pace, Brien R, NP       [MAR Hold] acetaminophen (TYLENOL) tablet 650 mg  650 mg Oral Q6H PRN Ralene Muskrat B, MD       Or   Doug Sou Hold] acetaminophen (TYLENOL) suppository 650 mg  650 mg Rectal Q6H PRN Sreenath, Sudheer B, MD       [MAR Hold] albuterol (PROVENTIL) (2.5 MG/3ML) 0.083% nebulizer solution 2.5 mg  2.5 mg Nebulization Q2H PRN Sidney Ace, MD       [MAR Hold] Chlorhexidine Gluconate Cloth 2 % PADS 6 each  6 each Topical Daily Ralene Muskrat B, MD   6 each at 09/08/22 1220  Chlorhexidine Gluconate Cloth 2 % PADS 6 each  6 each Topical Once Pace, Brien R, NP       diphenhydrAMINE (BENADRYL) injection 50 mg  50 mg Intravenous Once PRN Pace, Brien R, NP       famotidine (PEPCID) tablet 40 mg  40 mg Oral Once PRN Pace, Brien R, NP       fentaNYL (SUBLIMAZE) 100 MCG/2ML injection            fentaNYL (SUBLIMAZE) 50 MCG/ML injection            [MAR Hold] fentaNYL (SUBLIMAZE) injection 12.5 mcg  12.5 mcg Intravenous Once PRN Drema Pry, NP       fentaNYL (SUBLIMAZE) injection    PRN Katha Cabal, MD   50 mcg at 09/08/22 1654   heparin  ADULT infusion 100 units/mL (25000 units/239m)  1,550 Units/hr Intravenous Continuous SRalene MuskratB, MD   Stopped at 09/08/22 1640   [MAR Hold] hydrALAZINE (APRESOLINE) injection 10 mg  10 mg Intravenous Q6H PRN SSidney Ace MD       [MAR Hold] HYDROcodone-acetaminophen (NORCO/VICODIN) 5-325 MG per tablet 1-2 tablet  1-2 tablet Oral Q4H PRN Sreenath, STrula Slade MD       [MAR Hold] HYDROmorphone (DILAUDID) injection 0.5-1 mg  0.5-1 mg Intravenous Q2H PRN Sreenath, STrula Slade MD       [MAR Hold] HYDROmorphone (DILAUDID) injection 1 mg  1 mg Intravenous Once PRN Pace, Brien R, NP       methylPREDNISolone sodium succinate (SOLU-MEDROL) 125 mg/2 mL injection 125 mg  125 mg Intravenous Once PRN Pace, Brien R, NP       methylPREDNISolone sodium succinate (SOLU-MEDROL) 125 mg/2 mL injection            methylPREDNISolone sodium succinate (SOLU-MEDROL) 40 mg/mL injection    PRN Schnier, GDolores Lory MD   125 mg at 09/08/22 1644   midazolam (VERSED) 2 MG/ML syrup 8 mg  8 mg Oral Once PRN PAnnalee GentaR, NP       midazolam (VERSED) 5 MG/5ML injection            midazolam (VERSED) injection    PRN Schnier, GDolores Lory MD   1 mg at 09/08/22 1654   [MAR Hold] ondansetron (ZOFRAN) tablet 4 mg  4 mg Oral Q6H PRN SSidney Ace MD       Or   [Doug SouHold] ondansetron (ZOFRAN) injection 4 mg  4 mg Intravenous Q6H PRN Sreenath, Sudheer B, MD       [MAR Hold] ondansetron (ZOFRAN) injection 4 mg  4 mg Intravenous Q6H PRN Pace, Brien R, NP       [MAR Hold] pantoprazole (PROTONIX) EC tablet 40 mg  40 mg Oral Daily Sreenath, Sudheer B, MD       [MAR Hold] potassium chloride SA (KLOR-CON M) CR tablet 40 mEq  40 mEq Oral Once SRalene MuskratB, MD       [MAR Hold] tamsulosin (FLOMAX) capsule 0.4 mg  0.4 mg Oral Daily Sreenath, Sudheer B, MD       [MAR Hold] traZODone (DESYREL) tablet 25 mg  25 mg Oral QHS PRN Sreenath, Sudheer B, MD       vancomycin (VANCOCIN) IVPB 1000 mg/200 mL premix  1,000 mg Intravenous  60 min Pre-Op Pace, Brien R, NP 200 mL/hr at 09/08/22 1646 1,000 mg at 09/08/22 1646    VITAL SIGNS: BP 122/79   Pulse 88   Temp 98.4 F (36.9  C) (Oral)   Resp 17   Ht '5\' 9"'$  (1.753 m)   Wt 174 lb 2.6 oz (79 kg)   SpO2 99%   BMI 25.72 kg/m  Filed Weights   09/07/22 1344 09/08/22 1540  Weight: 174 lb 2.6 oz (79 kg) 174 lb 2.6 oz (79 kg)    Estimated body mass index is 25.72 kg/m as calculated from the following:   Height as of this encounter: '5\' 9"'$  (1.753 m).   Weight as of this encounter: 174 lb 2.6 oz (79 kg).  LABS: CBC:    Component Value Date/Time   WBC 5.3 09/08/2022 0504   HGB 9.6 (L) 09/08/2022 0504   HGB 11.5 (L) 09/07/2022 1006   HGB 16.7 12/02/2021 1332   HCT 28.1 (L) 09/08/2022 0504   HCT 46.9 12/02/2021 1332   PLT 169 09/08/2022 0504   PLT 213 09/07/2022 1006   PLT 359 12/02/2021 1332   MCV 89.8 09/08/2022 0504   MCV 93 12/02/2021 1332   NEUTROABS 8.8 (H) 09/07/2022 1350   NEUTROABS 4.7 06/10/2020 0855   LYMPHSABS 0.5 (L) 09/07/2022 1350   LYMPHSABS 2.2 06/10/2020 0855   MONOABS 0.1 09/07/2022 1350   EOSABS 0.1 09/07/2022 1350   EOSABS 0.2 06/10/2020 0855   BASOSABS 0.0 09/07/2022 1350   BASOSABS 0.1 06/10/2020 0855   Comprehensive Metabolic Panel:    Component Value Date/Time   NA 129 (L) 09/08/2022 0504   NA 142 12/02/2021 1332   K 3.1 (L) 09/08/2022 0504   CL 93 (L) 09/08/2022 0504   CO2 27 09/08/2022 0504   BUN 20 09/08/2022 0504   BUN 12 12/02/2021 1332   CREATININE 1.26 (H) 09/08/2022 0504   CREATININE 1.20 09/07/2022 1006   GLUCOSE 99 09/08/2022 0504   CALCIUM 8.1 (L) 09/08/2022 0504   AST 23 09/07/2022 1350   AST 22 09/07/2022 1006   ALT 34 09/07/2022 1350   ALT 32 09/07/2022 1006   ALKPHOS 134 (H) 09/07/2022 1350   BILITOT 1.2 09/07/2022 1350   BILITOT 0.9 09/07/2022 1006   PROT 7.2 09/07/2022 1350   PROT 7.7 12/02/2021 1332   ALBUMIN 3.1 (L) 09/07/2022 1350   ALBUMIN 4.7 12/02/2021 1332    RADIOGRAPHIC STUDIES: CT Angio  Chest PE W and/or Wo Contrast  Result Date: 09/07/2022 CLINICAL DATA:  Stage IV ureteral carcinoma. Hypotension and syncope. * Tracking Code: BO * EXAM: CT ANGIOGRAPHY CHEST WITH CONTRAST TECHNIQUE: Multidetector CT imaging of the chest was performed using the standard protocol during bolus administration of intravenous contrast. Multiplanar CT image reconstructions and MIPs were obtained to evaluate the vascular anatomy. RADIATION DOSE REDUCTION: This exam was performed according to the departmental dose-optimization program which includes automated exposure control, adjustment of the mA and/or kV according to patient size and/or use of iterative reconstruction technique. CONTRAST:  140m OMNIPAQUE IOHEXOL 350 MG/ML SOLN COMPARISON:  PET-CT 08/14/2022.  Chest CT 08/05/2022 noncontrast FINDINGS: Cardiovascular: Heart is nonenlarged. Trace pericardial fluid. Right upper chest port in place. The port is accessed. In the right lower lobe is a filling defect along the medial branch of the right lower lobe pulmonary artery consistent with a subsegmental pulmonary embolism. No other larger and more central emboli are identified. Mediastinum/Nodes: No specific abnormal lymph node enlargement identified in the axillary regions. There are several abnormal mediastinal and hilar lymph nodes once again identified. These are increased from the study of 08/05/2022. Example to the left of the main pulmonary artery on series 4, image 67 measures 2.3  x 1.7 cm. Right hilar node anteriorly on series 4, image 68 measures 15 by 12 mm. Left hilar node on image 80 of series 4 measures 16 by 11 mm. Several other nodes are identified. There are also some prominent nodes along the thoracic inlet on the left side. All of these are also larger than the PET-CT scan of 08/14/22. Normal caliber thoracic esophagus. Lungs/Pleura: No pneumothorax or consolidation. Trace right-sided pleural fluid. Once again there are numerous bilateral lung nodules  of varying size but the vast majority are under a cm. At least 15 nodules are identified. There is 1 lesion along the right lower lobe on image 95 of series 6 which is cavitary measuring 2.3 by 1.9 cm. Example nodule in the left lung, lower lobe on series 6, image 97 measures 12 by 10 mm. All of these are increased in size and number. Upper Abdomen: There are multiple perinephric space nodules involving the right kidney as well as poor visualization of the parenchyma of the right kidney consistent with known mass lesions. There is also nodular enlargement of the adrenal glands which also appears to have increased. Musculoskeletal: Mild degenerative changes along the spine. Review of the MIP images confirms the above findings. Critical Value/emergent results were called by telephone at the time of interpretation on 09/07/2022 at 2:07 pm to provider Dr. Joni Fears, who verbally acknowledged these results. IMPRESSION: Progression of metastatic disease. This includes lung nodules, mediastinal lymph nodes and upper abdominal lesions. Right lower lobe pulmonary emboli. Please see separate dictation of abdomen and pelvis CT from same day Electronically Signed   By: Jill Side M.D.   On: 09/07/2022 17:21   CT ABDOMEN PELVIS W CONTRAST  Result Date: 09/07/2022 CLINICAL DATA:  Epigastric pain again leg swelling. 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 * Tracking Code: BO * EXAM: CT ABDOMEN AND PELVIS WITH CONTRAST TECHNIQUE: Multidetector CT imaging of the abdomen and pelvis was performed using the standard protocol following bolus administration of intravenous contrast. RADIATION DOSE REDUCTION: This exam was performed according to the departmental dose-optimization program which includes automated exposure control, adjustment of the mA and/or kV according to patient size and/or use of iterative reconstruction technique. CONTRAST:  12m OMNIPAQUE IOHEXOL 350 MG/ML SOLN COMPARISON:  PET-CT 08/14/2022.   Standard CT 07/31/2022 FINDINGS: Lower chest: Please see separate dictation of chest CT angiogram from same date Hepatobiliary: Liver metastases are noted. Central lesion in the right hepatic lobe with some peripheral segmental biliary duct ectasia is now identified on series 2, image 15 measuring 2.2 x 1.6 cm. The duct dilatation is likely related to secondary mass effect. Small lesions are also seen peripherally in the right hepatic lobe on image 17 and 21 in segment 4. Portal vein is patent. Gallbladder is nondilated. Pancreas: Unremarkable. No pancreatic ductal dilatation or surrounding inflammatory changes. Spleen: Normal in size without focal abnormality. Adrenals/Urinary Tract: Increasing left adrenal nodule. On image 26 of series 2 this measures 17 x 15 mm. There is also an increasing right adrenal metastasis. Left kidney has some small Bosniak 1 cysts, unchanged from prior. No left-sided renal collecting system dilatation. Left ureter has a normal course and caliber down to the bladder. Preserved contours of the urinary bladder. There is large infiltrative mass involving the right kidney along its posterior aspect. Previously this measured 6.0 x 4.6 cm and on the current examination 6.5 by 4.4 cm. Once again there is ill-defined margins extending into the renal hilum collecting system. In addition there is  marked increase in the number and extent of perinephric space nodules diffusely. Example superiorly on series 28 of series 2 measuring 15 by 14 mm. Previously this measured less than 5 mm. There is significant retroperitoneal stranding identified which is significantly increased from prior. Stomach/Bowel: Large bowel has a normal course and caliber. Scattered diverticula. No smaller large bowel dilatation. The stomach is underdistended. Vascular/Lymphatic: Normal caliber aorta with scattered vascular calcifications. There is significant increase in retroperitoneal abnormal lymph nodes. Example retrocaval  on series 2 image 37 measures 4.7 x 2.7 cm. On the patient's prior PET-CT this measured 4.0 x 2.2 cm. There is several others which have increased as well and now cause significant compression of the IVC. Another example is a left para-aortic node which previously would have had dimensions of 11 mm in transverse and today 14 mm on series 2, image 36. Of note caudal to the area of narrowing of the IVC the veins are distended all the way into the area of the lower extremities. There is also edema of the right lower extremity. Please correlate with separate ultrasound of the legs for DVT from same day. Of note, 1 of the retrocaval mass lesions identified does appear to cause deformity of the adjacent vertebral body at L2 and extend into the vertebral body itself. Reproductive: Enlarged prostate with mass effect along the base of the bladder. Other: Anasarca.  Nonspecific presacral stranding. Musculoskeletal: Degenerative changes of the spine and pelvis. Pars defects again L5. Once again there is invasion of a retroperitoneal node into the margin of the vertebral body at L2 best seen on sagittal image 54 and axial image 38. Again new from previous examinations. Critical Value/emergent results were called by telephone at the time of interpretation on 09/07/2022 at 2:19 pm to provider Dr. Joni Fears, who verbally acknowledged these results. IMPRESSION: Marked increase of neoplasm metastatic disease including size of the primary right renal mass. Significant increase in size number perinephric nodules, bilateral adrenal masses, retroperitoneal nodes and liver lesions. The retroperitoneal lymphadenopathy does cause severe narrowing of the IVC focally and associated distention of caudal veins including the pelvis. Please correlate with separate DVT ultrasound. In addition 1 of the retroperitoneal nodes does appear to extend directly into and invade the L2 vertebral body. No bowel obstruction or free air. Scattered anasarca  including along the retroperitoneum and presacral regions. Electronically Signed   By: Jill Side M.D.   On: 09/07/2022 17:21   US Venous Img Lower Right (DVT Study)  Result Date: 09/07/2022 CLINICAL DATA:  Acute right lower extremity pain and edema. EXAM: Right LOWER EXTREMITY VENOUS DOPPLER ULTRASOUND TECHNIQUE: Gray-scale sonography with graded compression, as well as color Doppler and duplex ultrasound were performed to evaluate the lower extremity deep venous systems from the level of the common femoral vein and including the common femoral, femoral, profunda femoral, popliteal and calf veins including the posterior tibial, peroneal and gastrocnemius veins when visible. The superficial great saphenous vein was also interrogated. Spectral Doppler was utilized to evaluate flow at rest and with distal augmentation maneuvers in the common femoral, femoral and popliteal veins. COMPARISON:  None Available. FINDINGS: Contralateral Common Femoral Vein: Respiratory phasicity is normal and symmetric with the symptomatic side. No evidence of thrombus. Normal compressibility. Common Femoral Vein: Noncompressible with no flow consistent with occlusive thrombus. Saphenofemoral Junction: Noncompressible with no flow consistent with occlusive thrombus. Profunda Femoral Vein: Noncompressible with no flow consistent with occlusive thrombus. Femoral Vein: Only partially compressible with possible rouleaux flow present. Popliteal  Vein: Only partially compressible with possible rouleaux low flow present. Calf Veins: No evidence of thrombus. Normal compressibility and flow on color Doppler imaging. Superficial Great Saphenous Vein: No evidence of thrombus. Normal compressibility. Venous Reflux:  None. Other Findings: There also appears to be noncompressible thrombus in the visualized portions of the right common and external iliac veins. IMPRESSION: Occlusive deep venous thrombosis is noted in the right common femoral and  profundus femoral veins as well as the right saphenofemoral junction. The right superficial femoral and popliteal veins are only partially compressible with probable rouleaux flow present. There also appears to be noncompressible thrombus within the visualized portions of the right common and external iliac veins. Electronically Signed   By: Marijo Conception M.D.   On: 09/07/2022 15:46   DG Chest Port 1 View  Result Date: 09/07/2022 CLINICAL DATA:  Provided history: Questionable sepsis. Evaluate for abnormality. EXAM: PORTABLE CHEST 1 VIEW COMPARISON:  PET-CT 08/14/2022. FINDINGS: Right chest infusion port catheter with tip at the level of the superior cavoatrial junction. Heart size within normal limits. Numerous bilateral pulmonary nodules consistent with known metastatic disease (as demonstrated on the recent prior PET-CT of 08/14/2022). No appreciable superimposed acute airspace consolidation. No evidence of pleural effusion or pneumothorax. No acute bony abnormality identified. Levocurvature of the thoracic spine. IMPRESSION: Numerous bilateral pulmonary nodules consistent with known metastatic disease (as demonstrated on the recent prior PET-CT of 08/14/2022). No appreciable superimposed acute airspace consolidation. Electronically Signed   By: Kellie Simmering D.O.   On: 09/07/2022 14:23   IR IMAGING GUIDED PORT INSERTION  Result Date: 08/25/2022 INDICATION: Chemotherapy EXAM: Chest port placement using ultrasound and fluoroscopic guidance MEDICATIONS: Documented in the EMR ANESTHESIA/SEDATION: Moderate (conscious) sedation was employed during this procedure. A total of Versed 2 mg and Fentanyl 100 mcg was administered intravenously. Moderate Sedation Time: 28 minutes. The patient's level of consciousness and vital signs were monitored continuously by radiology nursing throughout the procedure under my direct supervision. FLUOROSCOPY TIME:  Fluoroscopy Time: 0.4 minutes (1 mGy) COMPLICATIONS: None  immediate. PROCEDURE: Informed written consent was obtained from the patient after a thorough discussion of the procedural risks, benefits and alternatives. All questions were addressed. Maximal Sterile Barrier Technique was utilized including caps, mask, sterile gowns, sterile gloves, sterile drape, hand hygiene and skin antiseptic. A timeout was performed prior to the initiation of the procedure. The patient was placed supine on the exam table. The right neck and chest was prepped and draped in the standard sterile fashion. A preliminary ultrasound of the right neck was performed and demonstrates a patent right internal jugular vein. A permanent ultrasound image was stored in the electronic medical record. The overlying skin was anesthetized with 1% Lidocaine. Using ultrasound guidance, access was obtained into the right internal jugular vein using a 21 gauge micropuncture set. A wire was advanced into the SVC, a short incision was made at the puncture site, and serial dilatation performed. Next, in an ipsilateral infraclavicular location, an incision was made at the site of the subcutaneous reservoir. Blunt dissection was used to open a pocket to contain the reservoir. A subcutaneous tunnel was then created from the port site to the puncture site. A(n) 8 Fr single lumen catheter was advanced through the tunnel. The catheter was attached to the port and this was placed in the subcutaneous pocket. Under fluoroscopic guidance, a peel away sheath was placed, and the catheter was trimmed to the appropriate length and was advanced into the central veins. The catheter  length is 22 cm. The tip of the catheter lies near the superior cavoatrial junction. The port flushes and aspirates appropriately. The port was flushed and locked with heparinized saline. The port pocket was closed in 2 layers using 3-0 and 4-0 Vicryl/absorbable suture. Dermabond was also applied to both incisions. The patient tolerated the procedure well  and was transferred to recovery in stable condition. IMPRESSION: Successful placement of a right-sided chest port via the right internal jugular vein. The port is ready for use. Electronically Signed   By: Albin Felling M.D.   On: 08/25/2022 12:10   NM PET Image Initial (PI) Skull Base To Thigh  Result Date: 08/14/2022 CLINICAL DATA:  Subsequent treatment strategy for metastatic bladder cancer. Retroperitoneal node biopsy 08/12/2022 (pending). EXAM: NUCLEAR MEDICINE PET SKULL BASE TO THIGH TECHNIQUE: 9.72 mCi F-18 FDG was injected intravenously. Full-ring PET imaging was performed from the skull base to thigh after the radiotracer. CT data was obtained and used for attenuation correction and anatomic localization. Fasting blood glucose: 96 mg/dl COMPARISON:  Chest CT 08/05/2022.  Abdominopelvic CT 07/31/2022. FINDINGS: Mediastinal blood pool activity: SUV max 1.3 NECK: No hypermetabolic cervical lymph nodes are identified. No suspicious activity identified within the pharyngeal mucosal space. Incidental CT findings: none CHEST: There are no hypermetabolic mediastinal, hilar or axillary lymph nodes. Best shown on recent CT, there are innumerable pulmonary nodules bilaterally which demonstrate hypermetabolic activity, highly concerning for metastatic disease. The individual nodules are suboptimally assessed due to breathing artifact and misregistration with the PET data. The most intense metabolic activity is in the right perihilar region (SUV max 5.0). A 9 mm right lower lobe nodule on image 112/2 has an SUV max of 2.8. Additional nodules include a 9 mm left upper lobe nodule on image 104/2 and a left lower lobe nodule measuring 10 mm on image 116/2. Incidental CT findings: Mild aortic and coronary artery atherosclerosis. ABDOMEN/PELVIS: There is no hypermetabolic activity within the liver, adrenal glands, spleen or pancreas. As shown on recent CT, there is a large centrally necrotic mass involving the posterior  aspect of the right kidney, measuring up to 6.0 x 4.2 cm on image 168/2. This demonstrates peripheral hypermetabolic activity and as correlated with recent CT is consistent with renal cell carcinoma. The retroperitoneal adenopathy seen on CT is hypermetabolic. For example, lymph nodes along the right renal vessels measuring up to 4.0 x 2.2 cm on image 170/2 have an SUV max of 9.5. Aortocaval node measuring 3.1 x 1.9 cm on image 187/2 has an SUV max of 8.8, and there is a left periaortic node measuring 11 mm on image 167/2 which has an SUV max of 6.6. No hypermetabolic nodes are identified in the pelvis. No definite abnormality of the bladder or prostate gland. Incidental CT findings: Simple appearing cyst in the upper pole of the left kidney appears stable, without worrisome features. No follow-up imaging recommended. Aortic and branch vessel atherosclerosis. Mild diverticulosis of the descending and sigmoid colon. Prostatomegaly. SKELETON: There is no hypermetabolic activity to suggest osseous metastatic disease. Incidental CT findings: Mild spondylosis. 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 IMPRESSION: 1. Large centrally necrotic right renal mass with peripheral hypermetabolic activity consistent with renal cell carcinoma. 2. Hypermetabolic retroperitoneal adenopathy and innumerable hypermetabolic pulmonary nodules bilaterally consistent with metastatic disease, likely from renal cell carcinoma. 3. No pelvic adenopathy or definite bladder abnormality to suggest bladder cancer. No evidence of osseous metastatic disease. 4. 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 5.  Aortic Atherosclerosis (ICD10-I70.0). Electronically Signed   By: Richardean Sale M.D.   On: 08/14/2022 14:47   CT ABDOMINAL MASS BIOPSY  Result Date: 08/12/2022 INDICATION: Metastatic retroperitoneal adenopathy, right renal cell  carcinoma by imaging EXAM: CT-GUIDED BIOPSY METASTATIC RIGHT RETROPERITONEAL ADENOPATHY MEDICATIONS: 1% LIDOCAINE LOCAL ANESTHESIA/SEDATION: 1.0 mg IV Versed; 50 mcg IV Fentanyl Moderate Sedation Time:  10 MINUTE The patient was continuously monitored during the procedure by the interventional radiology nurse under my direct supervision. PROCEDURE: The procedure, risks, benefits, and alternatives were explained to the patient. Questions regarding the procedure were encouraged and answered. The patient understands and consents to the procedure. Previous imaging reviewed. Patient positioned prone. Noncontrast localization CT performed. The right retroperitoneal adenopathy was localized and marked for a right posterior paraspinous approach. Under sterile conditions and local anesthesia, the 17 gauge 11.8 cm guide was advanced from a right posterior paraspinous approach to the adenopathy. Needle position confirmed with CT. 18 gauge core biopsies obtained. These were placed in formalin. Samples were intact and non fragmented. Needle removed. Postprocedure imaging demonstrates no hemorrhage or hematoma. Patient tolerated the procedure well without complication. Vital sign monitoring by nursing staff during the procedure will continue as patient is in the special procedures unit for post procedure observation. FINDINGS: The images document guide needle placement within the metastatic retroperitoneal adenopathy. Post biopsy images demonstrate no hemorrhage or hematoma. COMPLICATIONS: None immediate. IMPRESSION: Successful CT-guided core biopsy of metastatic retroperitoneal adenopathy. Marland Kitchen RADIATION DOSE REDUCTION: This exam was performed according to the departmental dose-optimization program which includes automated exposure control, adjustment of the mA and/or kV according to patient size and/or use of iterative reconstruction technique. Electronically Signed   By: Jerilynn Mages.  Shick M.D.   On: 08/12/2022 11:57    PERFORMANCE  STATUS (ECOG) : 2 - Symptomatic, <50% confined to bed  Review of Systems Unless otherwise noted, a complete review of systems is negative.  Physical Exam General: NAD Pulmonary: Unlabored Extremities: no edema, no joint deformities Skin: no rashes Neurological: Weakness but otherwise nonfocal  IMPRESSION: I met with patient and wife.  Patient reports that he feels better today but still has right lower extremity pain.  He is pending mechanical thrombectomy later today by vascular.  I reviewed the results of the CTA of the chest and CT of the abdomen and pelvis with patient and wife.  Both verbalized understanding that imaging is suggestive of progressive metastatic disease.  Patient has only completed one cycle of chemotherapy and therefore it would likely be too soon to call this treatment failure.  However, it clearly reflects the aggressiveness of his cancer.  Additionally, patient poorly tolerated chemotherapy and patient will likely require adjustment any future regimen.  Case discussed with Dr. Grayland Ormond who will also see the patient.  Patient recognizes that his cancer is incurable.  We discussed the balance between quality of life and quantity of life in terms of cancer treatment.  Both patient and wife are in agreement with current scope of treatment and would like to resolve his acute medical issues and then return to the [facility] for further discussions.  Patient says that he does not think that he would want life-prolonging measures or resuscitation if care were futile.  However, he would like to give further consideration prior to making any final decisions on CODE STATUS.  PLAN: -Continue current scope of treatment -Will follow up with patient when he returns [facility]  Case and plan discussed with Dr. Grayland Ormond   Time Total: 60 minutes  Visit consisted of counseling and education dealing with the complex and emotionally intense issues of symptom management  and palliative care in the setting of serious and potentially life-threatening illness.Greater than 50%  of this time was spent counseling and coordinating care related to the above assessment and plan.  Signed by: Altha Harm, PhD, NP-C

## 2022-09-08 NOTE — Op Note (Signed)
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   US guidance for vascular access to right popliteal vein 2.   Catheter placement into right iliac vein from right popliteal approach 3.   IVC gram and right lower extremity venogram 4.   Mechanical thrombectomy to right common femoral vein, right external and common iliac veins and the inferior vena cava up to the level of the renal veins 5.   PTA of inferior vena cava with 14 mm x 40 mm Atlas balloon  SURGEON: Hortencia Pilar, MD  ASSISTANT(S): none  ANESTHESIA: Continuous ECG pulse oximetry and cardiopulmonary monitoring was performed throughout the entire procedure by the interventional radiology nurse total sedation time is 77 minutes.  Parenteral Versed and fentanyl were utilized.  ESTIMATED BLOOD LOSS: 400  FINDING(S): 1.  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 male who presents with symptomatic painful right lower extremity associated with DVT of the common femoral and iliac veins.  Patient has marked leg swelling and pain.  Venous intervention is performed to reduce the symtpoms and avoid long term postphlebitic symptoms.  The risks and benefits are reviewed with the patient all questions been answered patient agrees to proceed.  DESCRIPTION: After obtaining full informed written consent, the patient was brought back to the vascular suite and placed supine upon the table. Moderate conscious sedation  was administered during a face to face encounter with the patient throughout the procedure with my supervision of the RN administering medicines and monitoring the patient's vital signs, pulse oximetry, telemetry and mental status throughout from the start of the procedure until the patient was taken to the recovery room.  After obtaining adequate anesthesia, the patient was prepped and draped in the standard fashion.     The patient was then placed into the prone position.  The right popliteal vein was then accessed under direct ultrasound guidance without difficulty with a micropuncture needle and a permanent image was recorded.  Microwire was then advanced followed by microsheath.  J-wire was then advanced followed by placing a 6 French sheath.  J-wire was then reintroduced and a Perclose device was utilized in [employer] fashion.  The sheath was then upsized to a 16 [place] sheath.  Hand-injection of contrast demonstrated minimal thrombus in the superficial femoral vein.  Occlusive thrombus is noted in the common femoral vein that extends into the iliac veins.  Imaging of the common iliac and inferior vena cava could not be achieved by hand-injection through the sheath at the popliteal level and therefore a Kumpe catheter and stiff angled glide wire were then advanced into the right common iliac vein and images were performed.  This demonstrated thrombus within the common iliac vein but also thrombus in the inferior vena cava extending all the way up to the L1 level.  Thrombus in the cava was occlusive.  I was able to cross the thrombus and stenosis and advance into the proximal IVC which was patent. I used  the Penumbra Cat 16 catheter and evacuated about 400 cc of effluent with mechanical thrombectomy throughout the iliac veins, CFV and inferior vena cava.  This had tremendous improvement with near total resolution of all of the previously imaged clot.  In the proximal common femoral along the lateral margin  there did appear to be a small cul-de-sac but there did not appear to be any overt extravasation.  Twice I inflated a balloon for 5 to 7 minutes low atmospheres.  This did not change this image it seems fairly static and I therefore move forward with treatment.  I reintroduced the CAT 16 up to the level of the stricture in the vena cava not sure that this was not any further thrombus and perform mechanical thrombectomy in this specific region.  Follow-up imaging demonstrated the stricture was unchanged and I elected to treat this area with angioplasty.  A supra core wire was introduced I then treated the inferior vena cava with a 14 mm diameter angioplasty balloon to open a channel.  This resulted in improved flow.  I then elected to terminate the procedure.  The sheath was removed the Perclose secured and a dressing was placed.  The patient was taken to the recovery room in stable condition having tolerated the procedure well.    COMPLICATIONS: None  CONDITION: Stable  Hortencia Pilar 09/08/2022 6:49 PM

## 2022-09-08 NOTE — Consult Note (Signed)
ANTICOAGULATION CONSULT NOTE - Initial Consult  Pharmacy Consult for heparin infusion Indication: DVT  Allergies  Allergen Reactions   Amoxicillin Itching   Atenolol Diarrhea    Other reaction(s): HIVES   Other Nausea And Vomiting    [employer] Beans   Oxycodone     Other reaction(s): HIVES   Shellfish Allergy Itching and Swelling    Patient Measurements: Height: '5\' 9"'$  (175.3 cm) Weight: 79 kg (174 lb 2.6 oz) IBW/kg (Calculated) : 70.7 Heparin Dosing Weight: 79 kg  Vital Signs: Temp: 99 F (37.2 C) (02/26 2327) Temp Source: Oral (02/26 1943) BP: 110/74 (02/26 2327) Pulse Rate: 95 (02/26 2327)  Labs: Recent Labs    09/07/22 1006 09/07/22 1348 09/07/22 1350 09/07/22 1428 09/07/22 1856 09/08/22 0019  HGB 11.5*  --  11.5*  --   --   --   HCT 33.3*  --  34.0*  --   --   --   PLT 213  --  232  --   --   --   APTT  --   --   --  28  --   --   LABPROT  --   --   --  15.6*  --   --   INR  --   --   --  1.3*  --   --   HEPARINUNFRC  --   --   --   --   --  0.26*  CREATININE 1.20  --  1.18  --   --   --   TROPONINIHS  --  17  --   --  20*  --      Estimated Creatinine Clearance: 62.4 mL/min (by C-G formula based on SCr of 1.18 mg/dL).   Medical History: Past Medical History:  Diagnosis Date   Cancer [facility]) 2013   bladder   Diverticulitis    History of chickenpox    History of measles    History of mumps    Hyperlipidemia    Hypertension     Medications:  PTA: N/A Inpatient: heparin infusion (2/26 >>>) Allergies: No AC/APT related allergies  Assessment: 67 yearold male presents to ED with compliant of severe leg pain. US DVT Study on right lower extremity showing Occlusive DVT in the right common femoral and profundus femoral veins as well as the right saphenofemoral junction. Pharmacy consulted for management of heparin infusion in the setting of DVT.   Date Time aPTT/HL Rate/Comment 2/27     0019      0.26              SUBtherapeutic @ 1400 u/hr  Goal of  Therapy:  Heparin level 0.3-0.7 units/ml Monitor platelets by anticoagulation protocol: Yes   Plan:  2/27:  HL @ 0019 = 0.26, SUBtherapeutic  Will order heparin 1150 units IV X 1 bolus and increase drip rate to 1550 units/hr.  Will recheck HL 6 hrs after rate change.  Continue to monitor H&H and platelets daily while on heparin gtt.   Amayra Kiedrowski D Clinical Pharmacist 09/08/2022 1:04 AM

## 2022-09-09 ENCOUNTER — Inpatient Hospital Stay (HOSPITAL_COMMUNITY)
Admit: 2022-09-09 | Discharge: 2022-09-09 | Disposition: A | Discharge status: Home / Self Care | Payer: BLUE CROSS/BLUE SHIELD | Attending: Internal Medicine | Admitting: Internal Medicine

## 2022-09-09 ENCOUNTER — Encounter: Payer: Self-pay | Admitting: Vascular Surgery

## 2022-09-09 DIAGNOSIS — C642 Malignant neoplasm of left kidney, except renal pelvis: Secondary | ICD-10-CM | POA: Diagnosis not present

## 2022-09-09 DIAGNOSIS — I10 Essential (primary) hypertension: Secondary | ICD-10-CM

## 2022-09-09 DIAGNOSIS — R55 Syncope and collapse: Secondary | ICD-10-CM

## 2022-09-09 DIAGNOSIS — I824Y1 Acute embolism and thrombosis of unspecified deep veins of right proximal lower extremity: Secondary | ICD-10-CM | POA: Diagnosis not present

## 2022-09-09 DIAGNOSIS — Z8551 Personal history of malignant neoplasm of bladder: Secondary | ICD-10-CM

## 2022-09-09 DIAGNOSIS — I2699 Other pulmonary embolism without acute cor pulmonale: Secondary | ICD-10-CM

## 2022-09-09 DIAGNOSIS — K219 Gastro-esophageal reflux disease without esophagitis: Secondary | ICD-10-CM

## 2022-09-09 LAB — CBC
HCT: 24.4 % — ABNORMAL LOW (ref 39.0–52.0)
Hemoglobin: 8.4 g/dL — ABNORMAL LOW (ref 13.0–17.0)
MCH: 30.5 pg (ref 26.0–34.0)
MCHC: 34.4 g/dL (ref 30.0–36.0)
MCV: 88.7 fL (ref 80.0–100.0)
Platelets: 125 10*3/uL — ABNORMAL LOW (ref 150–400)
RBC: 2.75 MIL/uL — ABNORMAL LOW (ref 4.22–5.81)
RDW: 11.9 % (ref 11.5–15.5)
WBC: 4.3 10*3/uL (ref 4.0–10.5)
nRBC: 0 % (ref 0.0–0.2)

## 2022-09-09 LAB — COMPREHENSIVE METABOLIC PANEL
ALT: 29 U/L (ref 0–44)
AST: 20 U/L (ref 15–41)
Albumin: 2.8 g/dL — ABNORMAL LOW (ref 3.5–5.0)
Alkaline Phosphatase: 122 U/L (ref 38–126)
Anion gap: 8 (ref 5–15)
BUN: 22 mg/dL (ref 8–23)
CO2: 24 mmol/L (ref 22–32)
Calcium: 8.7 mg/dL — ABNORMAL LOW (ref 8.9–10.3)
Chloride: 101 mmol/L (ref 98–111)
Creatinine, Ser: 1.1 mg/dL (ref 0.61–1.24)
GFR, Estimated: >60 mL/min (ref >60)
Glucose, Bld: 101 mg/dL — ABNORMAL HIGH (ref 70–99)
Potassium: 3.8 mmol/L (ref 3.5–5.1)
Sodium: 133 mmol/L — ABNORMAL LOW (ref 135–145)
Total Bilirubin: 0.6 mg/dL (ref 0.3–1.2)
Total Protein: 6.1 g/dL — ABNORMAL LOW (ref 6.5–8.1)

## 2022-09-09 LAB — HEPARIN LEVEL (UNFRACTIONATED)
Heparin Unfractionated: 0.28 IU/mL — ABNORMAL LOW (ref 0.30–0.70)
Heparin Unfractionated: 0.63 IU/mL (ref 0.30–0.70)
Heparin Unfractionated: 0.57 IU/mL (ref 0.30–0.70)

## 2022-09-09 LAB — C DIFFICILE QUICK SCREEN W PCR REFLEX
C Diff antigen: NEGATIVE
C Diff interpretation: NOT DETECTED
C Diff toxin: NEGATIVE

## 2022-09-09 LAB — PHOSPHORUS: Phosphorus: 3.1 mg/dL (ref 2.5–4.6)

## 2022-09-09 LAB — MAGNESIUM: Magnesium: 2.3 mg/dL (ref 1.7–2.4)

## 2022-09-09 MED ORDER — SODIUM CHLORIDE 0.9 % IV SOLN: INTRAVENOUS | Status: AC

## 2022-09-09 MED ORDER — HEPARIN BOLUS VIA INFUSION
1200.0000 [IU] | Freq: Once | INTRAVENOUS | Status: AC
  Administered 2022-09-09: 1200 [IU] via INTRAVENOUS
  Filled 2022-09-09: qty 1200

## 2022-09-09 NOTE — Consult Note (Signed)
[place]  [phone] [phone]) [phone]  ID: CHRISTOFHER EASTMAN OB: 08-23-1955  MR#: [id number]  [id number]  Patient Care Team: Birdie Sons, MD as PCP - General (Family Medicine) Billey Co, MD as Consulting Physician (Urology) Lloyd Huger, MD as Consulting Physician (Oncology)  CHIEF COMPLAINT: Stage IV urothelial carcinoma, failure to thrive, DVT/PE.  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  He initiated chemotherapy with cisplatin and gemcitabine approximately 2 weeks ago.  His most recent treatment with gemcitabine only was 1 week ago.  He recently presented to the hospital with syncope and right lower leg pain and was found to have extensive DVT and PE.  He also has a decreased performance status and poor appetite secondary to his chemotherapy.  He has no neurologic complaints.  He denies any recent fevers.  He has no chest pain, shortness of breath, cough, or hemoptysis.  He denies any nausea, vomiting, constipation, or diarrhea.  He has no urinary complaints.  Patient feels generally terrible, but offers no further specific complaints today.  REVIEW OF SYSTEMS:   Review of Systems  Constitutional:  Positive for malaise/fatigue. Negative for fever and weight loss.  Respiratory: Negative.  Negative for cough, hemoptysis and shortness of breath.   Cardiovascular:  Positive for leg swelling. Negative for chest pain.  Gastrointestinal:  Negative for abdominal pain.  Genitourinary: Negative.  Negative for dysuria.  Musculoskeletal: Negative.  Negative for back pain.  Skin: Negative.  Negative for rash.  Neurological:  Positive for weakness. Negative for dizziness, focal weakness and headaches.  Psychiatric/Behavioral: Negative.  The patient is not nervous/anxious.     As per HPI. Otherwise, a complete review of systems is negative.  PAST MEDICAL  HISTORY: Past Medical History:  Diagnosis Date   Cancer ([place]) 2013   bladder   Diverticulitis    History of chickenpox    History of measles    History of mumps    Hyperlipidemia    Hypertension     PAST SURGICAL HISTORY: Past Surgical History:  Procedure Laterality Date   HERNIA REPAIR  2002,2003   IR IMAGING GUIDED PORT INSERTION  08/25/2022   PERIPHERAL VASCULAR THROMBECTOMY Right 09/08/2022   Procedure: PERIPHERAL VASCULAR THROMBECTOMY;  Surgeon: Katha Cabal, MD;  Location: [facility];  Service: Cardiovascular;  Laterality: Right;   SIGMOIDOSCOPY  1998   [facility]   TRANSURETHRAL RESECTION OF BLADDER TUMOR  05/11/2012   Dr. Sherral Hammers, [place]  2002   Bilateral inguinal herniorrhaphies    FAMILY HISTORY: Family History  Problem Relation Age of Onset   COPD Mother    Diverticulosis Mother    Diverticulosis Father    Diabetes Father    Hypertension Father    Diverticulosis Sister    Stroke Paternal Grandmother     ADVANCED DIRECTIVES (Y/N):  '@ADVDIR'$ @  HEALTH MAINTENANCE: Social History   Tobacco Use   Smoking status: Former    Packs/day: 2.00    Years: 39.00    Total pack years: 78.00    Types: Cigarettes    Quit date: 07/14/2011    Years since quitting: 11.1    Passive exposure: Past   Smokeless tobacco: Never   Tobacco comments:    smoked 1 to 1 1/2  ppd for 35 years  Vaping Use   Vaping Use: Never used  Substance Use Topics   Alcohol use: Yes  Comment: drinks 2 beers daily: none since 1 week ago   Drug use: No     Colonoscopy:  PAP:  Bone density:  Lipid panel:  Allergies  Allergen Reactions   Amoxicillin Itching   Atenolol Diarrhea    Other reaction(s): HIVES   Other Nausea And Vomiting    [employer]ans   Oxycodone     Other reaction(s): HIVES   Shellfish Allergy Itching and Swelling    Current Facility-Administered Medications  Medication Dose Route Frequency Provider Last Rate Last Admin    0.9 %  sodium chloride infusion   Intravenous Continuous Raiford Noble Sparks, DO 75 mL/hr at 09/09/22 1806 New Bag at 09/09/22 1806   acetaminophen (TYLENOL) tablet 650 mg  650 mg Oral Q6H PRN Schnier, Dolores Lory, MD       Or   acetaminophen (TYLENOL) suppository 650 mg  650 mg Rectal Q6H PRN Schnier, Dolores Lory, MD       albuterol (PROVENTIL) (2.5 MG/3ML) 0.083% nebulizer solution 2.5 mg  2.5 mg Nebulization Q2H PRN Schnier, Dolores Lory, MD       Chlorhexidine Gluconate Cloth 2 % PADS 6 each  6 each Topical Daily Schnier, Dolores Lory, MD   6 each at 09/09/22 1030   fentaNYL (SUBLIMAZE) injection 12.5 mcg  12.5 mcg Intravenous Once PRN Schnier, Dolores Lory, MD       heparin ADULT infusion 100 units/mL (25000 units/244m)  1,700 Units/hr Intravenous Continuous SRalene MuskratB, MD 17 mL/hr at 09/09/22 1457 1,700 Units/hr at 09/09/22 1457   hydrALAZINE (APRESOLINE) injection 10 mg  10 mg Intravenous Q6H PRN Schnier, GDolores Lory MD       HYDROcodone-acetaminophen (NORCO/VICODIN) 5-325 MG per tablet 1-2 tablet  1-2 tablet Oral Q4H PRN Schnier, GDolores Lory MD       HYDROmorphone (DILAUDID) injection 0.5-1 mg  0.5-1 mg Intravenous Q2H PRN Schnier, GDolores Lory MD       HYDROmorphone (DILAUDID) injection 1 mg  1 mg Intravenous Once PRN Schnier, GDolores Lory MD       ondansetron ([facility] tablet 4 mg  4 mg Oral Q6H PRN Schnier, GDolores Lory MD       Or   ondansetron ([facility] injection 4 mg  4 mg Intravenous Q6H PRN Schnier, GDolores Lory MD       ondansetron ([facility] injection 4 mg  4 mg Intravenous Q6H PRN Schnier, GDolores Lory MD       pantoprazole (PROTONIX) EC tablet 40 mg  40 mg Oral Daily Schnier, GDolores Lory MD       tamsulosin (FLOMAX) capsule 0.4 mg  0.4 mg Oral Daily Schnier, GDolores Lory MD       traZODone (DESYREL) tablet 25 mg  25 mg Oral QHS PRN Schnier, GDolores Lory MD        OBJECTIVE: Vitals:   09/09/22 0756 09/09/22 1145  BP: 124/78 119/76  Pulse: 84 85  Resp: 18 18  Temp: 98 F (36.7 C) 98.3 F (36.8  C)  SpO2: 97% 95%     Body mass index is 25.72 kg/m.    ECOG FS:3 - Symptomatic, >50% confined to bed  General: Well-developed, well-nourished, no acute distress. Eyes: Pink conjunctiva, anicteric sclera. HEENT: Normocephalic, moist mucous membranes. Lungs: No audible wheezing or coughing. Heart: Regular rate and rhythm. Abdomen: Soft, nontender, no obvious distention. Musculoskeletal: No edema, cyanosis, or clubbing. Neuro: Alert, answering all questions appropriately. Cranial nerves grossly intact. Skin: No rashes or petechiae noted. Psych: Normal affect.   LAB RESULTS:  Lab Results  Component Value Date   NA 133 (L) 09/09/2022   K 3.8 09/09/2022   CL 101 09/09/2022   CO2 24 09/09/2022   GLUCOSE 101 (H) 09/09/2022   BUN 22 09/09/2022   CREATININE 1.10 09/09/2022   CALCIUM 8.7 (L) 09/09/2022   PROT 6.1 (L) 09/09/2022   ALBUMIN 2.8 (L) 09/09/2022   AST 20 09/09/2022   ALT 29 09/09/2022   ALKPHOS 122 09/09/2022   BILITOT 0.6 09/09/2022   GFRNONAA >60 09/09/2022   GFRAA 90 06/10/2020    Lab Results  Component Value Date   WBC 4.3 09/09/2022   NEUTROABS 8.8 (H) 09/07/2022   HGB 8.4 (L) 09/09/2022   HCT 24.4 (L) 09/09/2022   MCV 88.7 09/09/2022   PLT 125 (L) 09/09/2022     STUDIES: PERIPHERAL VASCULAR CATHETERIZATION  Result Date: 09/08/2022 See surgical note for result.  CT Angio Chest PE W and/or Wo Contrast  Result Date: 09/07/2022 CLINICAL DATA:  Stage IV ureteral carcinoma. Hypotension and syncope. * Tracking Code: BO * EXAM: CT ANGIOGRAPHY CHEST WITH CONTRAST TECHNIQUE: Multidetector CT imaging of the chest was performed using the standard protocol during bolus administration of intravenous contrast. Multiplanar CT image reconstructions and MIPs were obtained to evaluate the vascular anatomy. RADIATION DOSE REDUCTION: This exam was performed according to the departmental dose-optimization program which includes automated exposure control, adjustment of the  mA and/or kV according to patient size and/or use of iterative reconstruction technique. CONTRAST:  118m OMNIPAQUE IOHEXOL 350 MG/ML SOLN COMPARISON:  PET-CT 08/14/2022.  Chest CT 08/05/2022 noncontrast FINDINGS: Cardiovascular: Heart is nonenlarged. Trace pericardial fluid. Right upper chest port in place. The port is accessed. In the right lower lobe is a filling defect along the medial branch of the right lower lobe pulmonary artery consistent with a subsegmental pulmonary embolism. No other larger and more central emboli are identified. Mediastinum/Nodes: No specific abnormal lymph node enlargement identified in the axillary regions. There are several abnormal mediastinal and hilar lymph nodes once again identified. These are increased from the study of 08/05/2022. Example to the left of the main pulmonary artery on series 4, image 67 measures 2.3 x 1.7 cm. Right hilar node anteriorly on series 4, image 68 measures 15 by 12 mm. Left hilar node on image 80 of series 4 measures 16 by 11 mm. Several other nodes are identified. There are also some prominent nodes along the thoracic inlet on the left side. All of these are also larger than the PET-CT scan of 08/14/22. Normal caliber thoracic esophagus. Lungs/Pleura: No pneumothorax or consolidation. Trace right-sided pleural fluid. Once again there are numerous bilateral lung nodules of varying size but the vast majority are under a cm. At least 15 nodules are identified. There is 1 lesion along the right lower lobe on image 95 of series 6 which is cavitary measuring 2.3 by 1.9 cm. Example nodule in the left lung, lower lobe on series 6, image 97 measures 12 by 10 mm. All of these are increased in size and number. Upper Abdomen: There are multiple perinephric space nodules involving the right kidney as well as poor visualization of the parenchyma of the right kidney consistent with known mass lesions. There is also nodular enlargement of the adrenal glands which also  appears to have increased. Musculoskeletal: Mild degenerative changes along the spine. Review of the MIP images confirms the above findings. Critical Value/emergent results were called by telephone at the time of interpretation on 09/07/2022 at 2:07 pm to provider  Dr. Joni Fears, who verbally acknowledged these results. IMPRESSION: Progression of metastatic disease. This includes lung nodules, mediastinal lymph nodes and upper abdominal lesions. Right lower lobe pulmonary emboli. Please see separate dictation of abdomen and pelvis CT from same day Electronically Signed   By: Jill Side M.D.   On: 09/07/2022 17:21   CT ABDOMEN PELVIS W CONTRAST  Result Date: 09/07/2022 CLINICAL DATA:  Epigastric pain again leg swelling. 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 * Tracking Code: BO * EXAM: CT ABDOMEN AND PELVIS WITH CONTRAST TECHNIQUE: Multidetector CT imaging of the abdomen and pelvis was performed using the standard protocol following bolus administration of intravenous contrast. RADIATION DOSE REDUCTION: This exam was performed according to the departmental dose-optimization program which includes automated exposure control, adjustment of the mA and/or kV according to patient size and/or use of iterative reconstruction technique. CONTRAST:  119m OMNIPAQUE IOHEXOL 350 MG/ML SOLN COMPARISON:  PET-CT 08/14/2022.  Standard CT 07/31/2022 FINDINGS: Lower chest: Please see separate dictation of chest CT angiogram from same date Hepatobiliary: Liver metastases are noted. Central lesion in the right hepatic lobe with some peripheral segmental biliary duct ectasia is now identified on series 2, image 15 measuring 2.2 x 1.6 cm. The duct dilatation is likely related to secondary mass effect. Small lesions are also seen peripherally in the right hepatic lobe on image 17 and 21 in segment 4. Portal vein is patent. Gallbladder is nondilated. Pancreas: Unremarkable. No pancreatic ductal dilatation or surrounding  inflammatory changes. Spleen: Normal in size without focal abnormality. Adrenals/Urinary Tract: Increasing left adrenal nodule. On image 26 of series 2 this measures 17 x 15 mm. There is also an increasing right adrenal metastasis. Left kidney has some small Bosniak 1 cysts, unchanged from prior. No left-sided renal collecting system dilatation. Left ureter has a normal course and caliber down to the bladder. Preserved contours of the urinary bladder. There is large infiltrative mass involving the right kidney along its posterior aspect. Previously this measured 6.0 x 4.6 cm and on the current examination 6.5 by 4.4 cm. Once again there is ill-defined margins extending into the renal hilum collecting system. In addition there is marked increase in the number and extent of perinephric space nodules diffusely. Example superiorly on series 28 of series 2 measuring 15 by 14 mm. Previously this measured less than 5 mm. There is significant retroperitoneal stranding identified which is significantly increased from prior. Stomach/Bowel: Large bowel has a normal course and caliber. Scattered diverticula. No smaller large bowel dilatation. The stomach is underdistended. Vascular/Lymphatic: Normal caliber aorta with scattered vascular calcifications. There is significant increase in retroperitoneal abnormal lymph nodes. Example retrocaval on series 2 image 37 measures 4.7 x 2.7 cm. On the patient's prior PET-CT this measured 4.0 x 2.2 cm. There is several others which have increased as well and now cause significant compression of the IVC. Another example is a left para-aortic node which previously would have had dimensions of 11 mm in transverse and today 14 mm on series 2, image 36. Of note caudal to the area of narrowing of the IVC the veins are distended all the way into the area of the lower extremities. There is also edema of the right lower extremity. Please correlate with separate ultrasound of the legs for DVT from  same day. Of note, 1 of the retrocaval mass lesions identified does appear to cause deformity of the adjacent vertebral body at L2 and extend into the vertebral body itself. Reproductive: Enlarged prostate with mass effect  along the base of the bladder. Other: Anasarca.  Nonspecific presacral stranding. Musculoskeletal: Degenerative changes of the spine and pelvis. Pars defects again L5. Once again there is invasion of a retroperitoneal node into the margin of the vertebral body at L2 best seen on sagittal image 54 and axial image 38. Again new from previous examinations. Critical Value/emergent results were called by telephone at the time of interpretation on 09/07/2022 at 2:19 pm to provider Dr. Joni Fears, who verbally acknowledged these results. IMPRESSION: Marked increase of neoplasm metastatic disease including size of the primary right renal mass. Significant increase in size number perinephric nodules, bilateral adrenal masses, retroperitoneal nodes and liver lesions. The retroperitoneal lymphadenopathy does cause severe narrowing of the IVC focally and associated distention of caudal veins including the pelvis. Please correlate with separate DVT ultrasound. In addition 1 of the retroperitoneal nodes does appear to extend directly into and invade the L2 vertebral body. No bowel obstruction or free air. Scattered anasarca including along the retroperitoneum and presacral regions. Electronically Signed   By: Jill Side M.D.   On: 09/07/2022 17:21   US Venous Img Lower Right (DVT Study)  Result Date: 09/07/2022 CLINICAL DATA:  Acute right lower extremity pain and edema. EXAM: Right LOWER EXTREMITY VENOUS DOPPLER ULTRASOUND TECHNIQUE: Gray-scale sonography with graded compression, as well as color Doppler and duplex ultrasound were performed to evaluate the lower extremity deep venous systems from the level of the common femoral vein and including the common femoral, femoral, profunda femoral, popliteal and  calf veins including the posterior tibial, peroneal and gastrocnemius veins when visible. The superficial great saphenous vein was also interrogated. Spectral Doppler was utilized to evaluate flow at rest and with distal augmentation maneuvers in the common femoral, femoral and popliteal veins. COMPARISON:  None Available. FINDINGS: Contralateral Common Femoral Vein: Respiratory phasicity is normal and symmetric with the symptomatic side. No evidence of thrombus. Normal compressibility. Common Femoral Vein: Noncompressible with no flow consistent with occlusive thrombus. Saphenofemoral Junction: Noncompressible with no flow consistent with occlusive thrombus. Profunda Femoral Vein: Noncompressible with no flow consistent with occlusive thrombus. Femoral Vein: Only partially compressible with possible rouleaux flow present. Popliteal Vein: Only partially compressible with possible rouleaux low flow present. Calf Veins: No evidence of thrombus. Normal compressibility and flow on color Doppler imaging. Superficial Great Saphenous Vein: No evidence of thrombus. Normal compressibility. Venous Reflux:  None. Other Findings: There also appears to be noncompressible thrombus in the visualized portions of the right common and external iliac veins. IMPRESSION: Occlusive deep venous thrombosis is noted in the right common femoral and profundus femoral veins as well as the right saphenofemoral junction. The right superficial femoral and popliteal veins are only partially compressible with probable rouleaux flow present. There also appears to be noncompressible thrombus within the visualized portions of the right common and external iliac veins. Electronically Signed   By: Marijo Conception M.D.   On: 09/07/2022 15:46   DG Chest Port 1 View  Result Date: 09/07/2022 CLINICAL DATA:  Provided history: Questionable sepsis. Evaluate for abnormality. EXAM: PORTABLE CHEST 1 VIEW COMPARISON:  PET-CT 08/14/2022. FINDINGS: Right chest  infusion port catheter with tip at the level of the superior cavoatrial junction. Heart size within normal limits. Numerous bilateral pulmonary nodules consistent with known metastatic disease (as demonstrated on the recent prior PET-CT of 08/14/2022). No appreciable superimposed acute airspace consolidation. No evidence of pleural effusion or pneumothorax. No acute bony abnormality identified. Levocurvature of the thoracic spine. IMPRESSION: Numerous bilateral pulmonary nodules  consistent with known metastatic disease (as demonstrated on the recent prior PET-CT of 08/14/2022). No appreciable superimposed acute airspace consolidation. Electronically Signed   By: Kellie Simmering D.O.   On: 09/07/2022 14:23   IR IMAGING GUIDED PORT INSERTION  Result Date: 08/25/2022 INDICATION: Chemotherapy EXAM: Chest port placement using ultrasound and fluoroscopic guidance MEDICATIONS: Documented in the EMR ANESTHESIA/SEDATION: Moderate (conscious) sedation was employed during this procedure. A total of Versed 2 mg and Fentanyl 100 mcg was administered intravenously. Moderate Sedation Time: 28 minutes. The patient's level of consciousness and vital signs were monitored continuously by radiology nursing throughout the procedure under my direct supervision. FLUOROSCOPY TIME:  Fluoroscopy Time: 0.4 minutes (1 mGy) COMPLICATIONS: None immediate. PROCEDURE: Informed written consent was obtained from the patient after a thorough discussion of the procedural risks, benefits and alternatives. All questions were addressed. Maximal Sterile Barrier Technique was utilized including caps, mask, sterile gowns, sterile gloves, sterile drape, hand hygiene and skin antiseptic. A timeout was performed prior to the initiation of the procedure. The patient was placed supine on the exam table. The right neck and chest was prepped and draped in the standard sterile fashion. A preliminary ultrasound of the right neck was performed and demonstrates a  patent right internal jugular vein. A permanent ultrasound image was stored in the electronic medical record. The overlying skin was anesthetized with 1% Lidocaine. Using ultrasound guidance, access was obtained into the right internal jugular vein using a 21 gauge micropuncture set. A wire was advanced into the SVC, a short incision was made at the puncture site, and serial dilatation performed. Next, in an ipsilateral infraclavicular location, an incision was made at the site of the subcutaneous reservoir. Blunt dissection was used to open a pocket to contain the reservoir. A subcutaneous tunnel was then created from the port site to the puncture site. A(n) 8 Fr single lumen catheter was advanced through the tunnel. The catheter was attached to the port and this was placed in the subcutaneous pocket. Under fluoroscopic guidance, a peel away sheath was placed, and the catheter was trimmed to the appropriate length and was advanced into the central veins. The catheter length is 22 cm. The tip of the catheter lies near the superior cavoatrial junction. The port flushes and aspirates appropriately. The port was flushed and locked with heparinized saline. The port pocket was closed in 2 layers using 3-0 and 4-0 Vicryl/absorbable suture. Dermabond was also applied to both incisions. The patient tolerated the procedure well and was transferred to recovery in stable condition. IMPRESSION: Successful placement of a right-sided chest port via the right internal jugular vein. The port is ready for use. Electronically Signed   By: Albin Felling M.D.   On: 08/25/2022 12:10   NM PET Image Initial (PI) Skull Base To Thigh  Result Date: 08/14/2022 CLINICAL DATA:  Subsequent treatment strategy for metastatic bladder cancer. Retroperitoneal node biopsy 08/12/2022 (pending). EXAM: NUCLEAR MEDICINE PET SKULL BASE TO THIGH TECHNIQUE: 9.72 mCi F-18 FDG was injected intravenously. Full-ring PET imaging was performed from the skull  base to thigh after the radiotracer. CT data was obtained and used for attenuation correction and anatomic localization. Fasting blood glucose: 96 mg/dl COMPARISON:  Chest CT 08/05/2022.  Abdominopelvic CT 07/31/2022. FINDINGS: Mediastinal blood pool activity: SUV max 1.3 NECK: No hypermetabolic cervical lymph nodes are identified. No suspicious activity identified within the pharyngeal mucosal space. Incidental CT findings: none CHEST: There are no hypermetabolic mediastinal, hilar or axillary lymph nodes. Best shown on recent  CT, there are innumerable pulmonary nodules bilaterally which demonstrate hypermetabolic activity, highly concerning for metastatic disease. The individual nodules are suboptimally assessed due to breathing artifact and misregistration with the PET data. The most intense metabolic activity is in the right perihilar region (SUV max 5.0). A 9 mm right lower lobe nodule on image 112/2 has an SUV max of 2.8. Additional nodules include a 9 mm left upper lobe nodule on image 104/2 and a left lower lobe nodule measuring 10 mm on image 116/2. Incidental CT findings: Mild aortic and coronary artery atherosclerosis. ABDOMEN/PELVIS: There is no hypermetabolic activity within the liver, adrenal glands, spleen or pancreas. As shown on recent CT, there is a large centrally necrotic mass involving the posterior aspect of the right kidney, measuring up to 6.0 x 4.2 cm on image 168/2. This demonstrates peripheral hypermetabolic activity and as correlated with recent CT is consistent with renal cell carcinoma. The retroperitoneal adenopathy seen on CT is hypermetabolic. For example, lymph nodes along the right renal vessels measuring up to 4.0 x 2.2 cm on image 170/2 have an SUV max of 9.5. Aortocaval node measuring 3.1 x 1.9 cm on image 187/2 has an SUV max of 8.8, and there is a left periaortic node measuring 11 mm on image 167/2 which has an SUV max of 6.6. No hypermetabolic nodes are identified in the  pelvis. No definite abnormality of the bladder or prostate gland. Incidental CT findings: Simple appearing cyst in the upper pole of the left kidney appears stable, without worrisome features. No follow-up imaging recommended. Aortic and branch vessel atherosclerosis. Mild diverticulosis of the descending and sigmoid colon. Prostatomegaly. SKELETON: There is no hypermetabolic activity to suggest osseous metastatic disease. Incidental CT findings: Mild spondylosis. 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 IMPRESSION: 1. Large centrally necrotic right renal mass with peripheral hypermetabolic activity consistent with renal cell carcinoma. 2. Hypermetabolic retroperitoneal adenopathy and innumerable hypermetabolic pulmonary nodules bilaterally consistent with metastatic disease, likely from renal cell carcinoma. 3. No pelvic adenopathy or definite bladder abnormality to suggest bladder cancer. No evidence of osseous metastatic disease. 4. 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 5.  Aortic Atherosclerosis (ICD10-I70.0). Electronically Signed   By: Richardean Sale M.D.   On: 08/14/2022 14:47   CT ABDOMINAL MASS BIOPSY  Result Date: 08/12/2022 INDICATION: Metastatic retroperitoneal adenopathy, right renal cell carcinoma by imaging EXAM: CT-GUIDED BIOPSY METASTATIC RIGHT RETROPERITONEAL ADENOPATHY MEDICATIONS: 1% LIDOCAINE LOCAL ANESTHESIA/SEDATION: 1.0 mg IV Versed; 50 mcg IV Fentanyl Moderate Sedation Time:  10 MINUTE The patient was continuously monitored during the procedure by the interventional radiology nurse under my direct supervision. PROCEDURE: The procedure, risks, benefits, and alternatives were explained to the patient. Questions regarding the procedure were encouraged and answered. The patient understands and consents to the procedure. Previous imaging reviewed. Patient positioned prone.  Noncontrast localization CT performed. The right retroperitoneal adenopathy was localized and marked for a right posterior paraspinous approach. Under sterile conditions and local anesthesia, the 17 gauge 11.8 cm guide was advanced from a right posterior paraspinous approach to the adenopathy. Needle position confirmed with CT. 18 gauge core biopsies obtained. These were placed in formalin. Samples were intact and non fragmented. Needle removed. Postprocedure imaging demonstrates no hemorrhage or hematoma. Patient tolerated the procedure well without complication. Vital sign monitoring by nursing staff during the procedure will continue as patient is in the special procedures unit for post procedure observation. FINDINGS: The  images document guide needle placement within the metastatic retroperitoneal adenopathy. Post biopsy images demonstrate no hemorrhage or hematoma. COMPLICATIONS: None immediate. IMPRESSION: Successful CT-guided core biopsy of metastatic retroperitoneal adenopathy. Marland Kitchen RADIATION DOSE REDUCTION: This exam was performed according to the departmental dose-optimization program which includes automated exposure control, adjustment of the mA and/or kV according to patient size and/or use of iterative reconstruction technique. Electronically Signed   By: Jerilynn Mages.  Shick M.D.   On: 08/12/2022 11:57    ASSESSMENT: Stage IV urothelial carcinoma, failure to thrive, DVT/PE.  PLAN:    Stage IV urothelial carcinoma: Patient last received chemotherapy with gemcitabine approximately 1 week ago.  He had difficulty with his first cycle of treatment.  CT scan on admission suggested progression of disease, but too soon to accurately tell given only 1 cycle of chemotherapy.  Patient has been instructed to keep his previously scheduled follow-up appointment next week for consider of cycle 2. DVT/PE: Patient currently on heparin drip and has stent placement scheduled with vascular surgery on Friday.  Okay to transition  to oral anticoagulation after stent placement. Failure to thrive: Secondary to malignancy and chemotherapy.  Plan to dose reduce chemotherapy with next cycle. Anemia: Patient's most recent hemoglobin is 8.4, monitor. Pancytopenia: Likely secondary to chemotherapy.  Monitor.  Appreciate consult, will follow.  Lloyd Huger, MD   09/09/2022 6:28 PM

## 2022-09-09 NOTE — Consult Note (Signed)
ANTICOAGULATION CONSULT NOTE - Initial Consult  Pharmacy Consult for heparin infusion Indication: DVT  Allergies  Allergen Reactions   Amoxicillin Itching   Atenolol Diarrhea    Other reaction(s): HIVES   Other Nausea And Vomiting    Navy Beans   Oxycodone     Other reaction(s): HIVES   Shellfish Allergy Itching and Swelling    Patient Measurements: Height: '5\' 9"'$  (175.3 cm) Weight: 79 kg (174 lb 2.6 oz) IBW/kg (Calculated) : 70.7 Heparin Dosing Weight: 79 kg  Vital Signs: Temp: 98.3 F (36.8 C) (02/28 1145) Temp Source: Oral (02/28 1145) BP: 119/76 (02/28 1145) Pulse Rate: 85 (02/28 1145)  Labs: Recent Labs    09/07/22 1006 09/07/22 1348 09/07/22 1350 09/07/22 1428 09/07/22 1856 09/08/22 0019 09/08/22 0504 09/08/22 0701 09/09/22 0152 09/09/22 1018 09/09/22 1640  HGB   < >  --  11.5*  --   --   --  9.6*  --  8.4*  --   --   HCT  --   --  34.0*  --   --   --  28.1*  --  24.4*  --   --   PLT  --   --  232  --   --   --  169  --  125*  --   --   APTT  --   --   --  28  --   --   --   --   --   --   --   LABPROT  --   --   --  15.6*  --   --   --   --   --   --   --   INR  --   --   --  1.3*  --   --   --   --   --   --   --   HEPARINUNFRC  --   --   --   --   --    < >  --    < > 0.28* 0.63 0.57  CREATININE  --   --  1.18  --   --   --  1.26*  --   --  1.10  --   TROPONINIHS  --  17  --   --  20*  --   --   --   --   --   --    < > = values in this interval not displayed.     Estimated Creatinine Clearance: 67 mL/min (by C-G formula based on SCr of 1.1 mg/dL).   Medical History: Past Medical History:  Diagnosis Date   Cancer [facility]) 2013   bladder   Diverticulitis    History of chickenpox    History of measles    History of mumps    Hyperlipidemia    Hypertension     Medications:  PTA: N/A Inpatient: heparin infusion (2/26 >>>) Allergies: No AC/APT related allergies  Assessment: 67 yearold male presents to ED with compliant of severe leg pain.  US DVT Study on right lower extremity showing Occlusive DVT in the right common femoral and profundus femoral veins as well as the right saphenofemoral junction. Pharmacy consulted for management of heparin infusion in the setting of DVT.   Date Time aPTT/HL Rate/Comment 2/27     0019      0.26              SUBtherapeutic @ 1400 u/hr  2/27 0701    0.34  Therapeutic x 1 2/28     0152      0.28              SUBtherapeutic X 1  2/28 1018    0.63  Therapeutic x 1 2/28 1640    0.57  Therapeutic x 2  Goal of Therapy:  Heparin level 0.3-0.7 units/ml Monitor platelets by anticoagulation protocol: Yes   Plan: Heparin level therapeutic x 2 Continue heparin drip at 1700 units/hr Will transition to daily heparin levels. Next heparin level tomorrow (2/29) AM. Continue to monitor H&H and platelets daily while on heparin gtt.  Wynelle Cleveland, PharmD, BCPS Clinical Pharmacist 09/09/2022 5:26 PM

## 2022-09-09 NOTE — Progress Notes (Signed)
Progress Note    09/09/2022 1:51 PM 1 Day Post-Op  Subjective:  Jeffrey Fernandez is a 67 y.o. male with medical history significant of stage IV urothelial carcinoma of right kidney currently on chemotherapy who presents to [facility] for evaluation after being seen at the cancer center and presenting with fatigue, nausea, dizziness, positional hypotension.  Patient endorses poor p.o. intake since chemotherapy last week.  Progressive fatigue.  Patient noted his right lower extremity to be edematous and painful with decreased range of motion. In the ED found to have extensive lower extremity DVT extending into multiple veins.   Patient is now POD #1 from IVC gram and right lower extremity venogram with mechanical thrombectomy to right common femoral vein, right external and common iliac veins and the inferior vena cava up to the level of the renal veins. Also had PTA of the inferior vena cava.   Vitals:   09/09/22 0756 09/09/22 1145  BP: 124/78 119/76  Pulse: 84 85  Resp: 18 18  Temp: 98 F (36.7 C) 98.3 F (36.8 C)  SpO2: 97% 95%   Physical Exam: Cardiac:  RRR no murmurs or gallops. Lungs: Lungs clear to auscultation normal respiratory effort. Incisions: Popliteal posterior right leg.  Dressing clean dry and intact. Extremities: Right lower extremity wrapped with Coban.  Swelling decreased from prior to procedure.  Right posttibial pulse palpable. Abdomen: Positive bowel sounds, soft, nontender and nondistended. Neurologic: Alert and oriented x 3.  Answers all questions appropriately.  Neuro intact.  CBC    Component Value Date/Time   WBC 4.3 09/09/2022 0152   RBC 2.75 (L) 09/09/2022 0152   HGB 8.4 (L) 09/09/2022 0152   HGB 11.5 (L) 09/07/2022 1006   HGB 16.7 12/02/2021 1332   HCT 24.4 (L) 09/09/2022 0152   HCT 46.9 12/02/2021 1332   PLT 125 (L) 09/09/2022 0152   PLT 213 09/07/2022 1006   PLT 359 12/02/2021 1332   MCV 88.7 09/09/2022 0152   MCV 93 12/02/2021 1332   MCH 30.5  09/09/2022 0152   MCHC 34.4 09/09/2022 0152   RDW 11.9 09/09/2022 0152   RDW 12.5 12/02/2021 1332   LYMPHSABS 0.5 (L) 09/07/2022 1350   LYMPHSABS 2.2 06/10/2020 0855   MONOABS 0.1 09/07/2022 1350   EOSABS 0.1 09/07/2022 1350   EOSABS 0.2 06/10/2020 0855   BASOSABS 0.0 09/07/2022 1350   BASOSABS 0.1 06/10/2020 0855    BMET    Component Value Date/Time   NA 133 (L) 09/09/2022 1018   NA 142 12/02/2021 1332   K 3.8 09/09/2022 1018   CL 101 09/09/2022 1018   CO2 24 09/09/2022 1018   GLUCOSE 101 (H) 09/09/2022 1018   BUN 22 09/09/2022 1018   BUN 12 12/02/2021 1332   CREATININE 1.10 09/09/2022 1018   CREATININE 1.20 09/07/2022 1006   CALCIUM 8.7 (L) 09/09/2022 1018   GFRNONAA >60 09/09/2022 1018   GFRNONAA >60 09/07/2022 1006   GFRAA 90 06/10/2020 0855    INR    Component Value Date/Time   INR 1.3 (H) 09/07/2022 1428     Intake/Output Summary (Last 24 hours) at 09/09/2022 1351 Last data filed at 09/08/2022 2040 Gross per 24 hour  Intake --  Output 550 ml  Net -550 ml     Assessment/Plan:  67 y.o. male is s/p IVC gram and right lower extremity venogram with mechanical thrombectomy to right common femoral vein, right external and common iliac veins and the inferior vena cava up to the level of  the renal veins. Also had PTA of the inferior vena cava.  1 Day Post-Op   PLAN:  Return to the vascular Lab on Friday for repeat IVC Gram with possible stent placement. Continue heparin infusion. Okay for patient to eat tonight and tomorrow. Will make NPO after midnight on Thursday.   DVT prophylaxis:  Heparin Infusion   Drema Pry [facility] 09/09/2022 1:51 PM

## 2022-09-09 NOTE — Progress Notes (Signed)
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  He endorsed poor p.o. intake since his chemotherapy and progressively worsened and got progressively worse and fatigue.  He noted right upper extremity edema and noted to be edematous with painful decreased range of motion.  He was sent from the cancer center to the emergency room in the ED they found extensive right lower extremity DVT extending into multiple veins.  Further workup also reveals that he is also got a right lower lobe PE.  Upon further evaluation vascular surgery was consulted and he was placed on a heparin drip and he was then taken for a mechanical thrombectomy.  Vascular surgery is following and IVC gram with possible stent placement is being repeated on Friday.  He needs further workup for his near syncope and will be obtaining an echocardiogram.  Assessment and Plan: No notes have been filed under this hospital service. Service: Hospitalist  DVT RLL PE -Patient presented with asymmetric right lower extremity swelling.   -Ultrasound confirmed DVT on presentation.   -CT angio ordered after patient was admitted.  Right lower lobe PE noted and ordering Physician was never called about this pulmonary embolism.  Radiology notes indicate that it was called out to EDP at 2 PM 2/26 however the study was not completed until after 4 PM. -Continue Heparin gtt with Pharmacy Dosing  -Vascular Surgery consulted and was planning for a mechanical thrombectomy for right lower extremity and today he is  postoperative day 1 from IVC gram and lower right extremity venogram with mechanical thrombectomy to the right common femoral vein, right external and common iliac veins and inferior vena cava up to the level of the renal veins and he also had PTA of the inferior vena cava. -Vascular surgery is recommending continuing heparin infusion for now and eating tonight and tomorrow and n.p.o. after midnight on Thursday for repeat IVC gram with possible stent placement on Friday -C/w Pain control with p.o/RC acetaminophen 650 mg every 6 as needed, hydrocodone-acetaminophen 1 to 2 tablets p.o. every 4 as needed for moderate pain and hydromorphone 0.5 to 1 mg IV every 2 as needed severe pain  Near Syncope Hypotension -Likely in the setting of poor p.o. intake/anorexia -Continue intravenous fluids but reduce rate from 100 mL/hr to 75 mL/hr x 12 more Hours Fall precautions -Obtain echocardiogram -Obtain PT and OT to further evaluate and treat once off of bedrest   Stage IV Urothelial Carcinoma -Seen outpatient by Dr. Grayland Ormond and Altha Harm nurse practitioner palliative care -Notified Vonna Kotyk Borders of patient's admission -CT angio thorax demonstrates progression of underlying metastatic disease -Evaluated and he is only completed 1 cycle of chemotherapy so this is too soon to call her treatment failure and Dr. Grayland Ormond is to see the patient as well. -For now they are recommending continue current scope of treatment and he will follow-up with the cancer center once he gets discharged.   Essential Hypertension -BP meds on hold -Continue monitor blood pressures per protocol -Last blood pressure reading was 119/76   BPH -C/w Tamsulosin 0.4 mg po Daily  Intractable nausea and vomiting -C/w Ondansetron 4 mg po/IV q6hprn -Continue with supportive care  AKI on CKD Stage II -BUN/Cr Trend: Recent Labs  Lab 08/26/22 1028 08/31/22 0951 09/01/22 0849 09/03/22 0917 09/07/22 1006 09/07/22 1350  09/08/22 0504  BUN 13 29* 27* '22 17 18 20  '$ CREATININE 1.05 1.45* 1.49* 1.40* 1.20 1.18 1.26*  -Getting IV fluid hydration as above but will reduce rate -Avoid Nephrotoxic Medications, Contrast Dyes, Hypotension and Dehydration to Ensure Adequate Renal Perfusion and will need to Renally Adjust Meds -Continue to Monitor and Trend Renal Function carefully and repeat CMP in the AM   Hyponatremia -Na+ Trend: Recent Labs  Lab 08/26/22 1028 08/31/22 0951 09/01/22 0849 09/03/22 0917 09/07/22 1006 09/07/22 1350 09/08/22 0504  NA 131* 126* 129* 129* 124* 125* 129*  -Continue to Monitor and Trend and Repeat CMP in the AM  Hypokalemia -Patient's K+ Level Trend: Recent Labs  Lab 08/26/22 1028 08/31/22 0951 09/01/22 0849 09/03/22 0917 09/07/22 1006 09/07/22 1350 09/08/22 0504  K 3.6 3.7 3.3* 3.7 3.1* 4.5 3.1*  -Repeat CMP this AM pending  -Continue to Monitor and Replete as Necessary -Repeat CMP in the AM   Normocytic Anemia -Hgb/Hct Trend: Recent Labs  Lab 08/31/22 0951 09/01/22 0849 09/03/22 0917 09/07/22 1006 09/07/22 1350 09/08/22 0504 09/09/22 0152  HGB 15.0 12.7* 13.3 11.5* 11.5* 9.6* 8.4*  HCT 43.7 36.6* 38.8* 33.3* 34.0* 28.1* 24.4*  MCV 90.1 89.3 89.6 88.8 90.2 89.8 88.7  -Check Anemia Panel in the AM -Continue to Monitor for S/Sx of Bleeding; No overt bleeding noted -Repeat CBC in the AM  Thrombocytopenia -Platelet Count Trend: Recent Labs  Lab 08/31/22 0951 09/01/22 0849 09/03/22 0917 09/07/22 1006 09/07/22 1350 09/08/22 0504 09/09/22 0152  PLT 404* 307 200 213 232 169 125*  -Continue to Monitor and Trend and Repeat CBC in the AM   Overweight -Complicates overall prognosis and care -Estimated body mass index is 25.72 kg/m as calculated from the following:   Height as of this encounter: '5\' 9"'$  (1.753 m).   Weight as of this encounter: 79 kg.  -Weight Loss and Dietary Counseling given  GERD/GI Prophylaxis -C/w PPI with Pantoprazole 40 mg po  Daily   DVT prophylaxis: Anticoagulated with Heparin gtt    Code Status: Full Code Family Communication: Discussed with wife at bedside  Disposition Plan:  Level of care: Med-Surg Status is: Inpatient Remains inpatient appropriate because: Needs further clinical improvement and clearance by the vascular surgery team   Consultants:  Vascular surgery Medical oncology Palliative care medicine  Procedures:  PROCEDURE: 1.   US guidance for vascular access to right popliteal vein 2.   Catheter placement into right iliac vein from right popliteal approach 3.   IVC gram and right lower extremity venogram 4.   Mechanical thrombectomy to right common femoral vein, right external and common iliac veins and the inferior vena cava up to the level of the renal veins 5.   PTA of inferior vena cava with 14 mm x 40 mm Atlas balloon  Antimicrobials:  Anti-infectives (From admission, onward)    Start     Dose/Rate Route Frequency Ordered Stop   09/08/22 1444  vancomycin (VANCOCIN) IVPB 1000 mg/200 mL premix        1,000 mg 200 mL/hr over 60 Minutes Intravenous 60 min pre-op 09/08/22 1444 09/08/22 1758       Subjective: Seen and examined at bedside and his right leg was wrapped and he was complaining of wanting something to  eat.  No nausea or vomiting.  Denies any lightheadedness or dizziness.  Feels okay.  No other concerns or complaints at this time but does complain about some right leg soreness  Objective: Vitals:   09/08/22 2307 09/09/22 0022 09/09/22 0602 09/09/22 0756  BP: 123/82 99/66 114/78 124/78  Pulse: (!) 109 (!) 102 87 84  Resp: '16 16 17 18  '$ Temp: 98.1 F (36.7 C)  98.9 F (37.2 C) 98 F (36.7 C)  TempSrc: Oral   Oral  SpO2: 98% 98% 98% 97%  Weight:      Height:        Intake/Output Summary (Last 24 hours) at 09/09/2022 [id number] Last data filed at 09/08/2022 2040 Gross per 24 hour  Intake --  Output 550 ml  Net -550 ml   Filed Weights   09/07/22 1344 09/08/22 1540   Weight: 79 kg 79 kg   Examination: Physical Exam:  Constitutional: WN/WD overweight chronically ill-appearing Caucasian male in no acute distress Respiratory: Diminished to auscultation bilaterally with coarse breath sounds, no wheezing, rales, rhonchi or crackles. Normal respiratory effort and patient is not tachypenic. No accessory muscle use.  Unlabored breathing Cardiovascular: RRR, no murmurs / rubs / gallops. S1 and S2 auscultated.  Right lower extremity is wrapped in Ace bandage Abdomen: Soft, non-tender, distended secondary to body habitus. Bowel sounds positive.  GU: Deferred. Musculoskeletal: Right leg is wrapped from his recent surgical intervention Skin: No rashes, lesions, ulcers limited skin evaluation. No induration; Warm and dry.  Neurologic: CN 2-12 grossly intact with no focal deficits. Romberg sign and cerebellar reflexes not assessed.  Psychiatric: Normal judgment and insight. Alert and oriented x 3. Normal mood and appropriate affect.   Data Reviewed: I have personally reviewed following labs and imaging studies  CBC: Recent Labs  Lab 09/03/22 0917 09/07/22 1006 09/07/22 1350 09/08/22 0504 09/09/22 0152  WBC 11.7* 9.2 9.5 5.3 4.3  NEUTROABS 9.3* 7.9* 8.8*  --   --   HGB 13.3 11.5* 11.5* 9.6* 8.4*  HCT 38.8* 33.3* 34.0* 28.1* 24.4*  MCV 89.6 88.8 90.2 89.8 88.7  PLT 200 213 232 169 0000000*   Basic Metabolic Panel: Recent Labs  Lab 09/03/22 0917 09/07/22 1006 09/07/22 1350 09/08/22 0504  NA 129* 124* 125* 129*  K 3.7 3.1* 4.5 3.1*  CL 91* 86* 89* 93*  CO2 '28 25 26 27  '$ GLUCOSE 114* 121* 156* 99  BUN '22 17 18 20  '$ CREATININE 1.40* 1.20 1.18 1.26*  CALCIUM 9.4 8.7* 8.5* 8.1*  MG 2.2 2.2  --   --    GFR: Estimated Creatinine Clearance: 58.4 mL/min (A) (by C-G formula based on SCr of 1.26 mg/dL (H)). Liver Function Tests: Recent Labs  Lab 09/03/22 0917 09/07/22 1006 09/07/22 1350  AST 34 22 23  ALT 67* 32 34  ALKPHOS 173* 132* 134*  BILITOT 1.1  0.9 1.2  PROT 7.5 7.1 7.2  ALBUMIN 3.4* 3.1* 3.1*   No results for input(s): "LIPASE", "AMYLASE" in the last 168 hours. No results for input(s): "AMMONIA" in the last 168 hours. Coagulation Profile: Recent Labs  Lab 09/07/22 1428  INR 1.3*   Cardiac Enzymes: No results for input(s): "CKTOTAL", "CKMB", "CKMBINDEX", "TROPONINI" in the last 168 hours. BNP (last 3 results) No results for input(s): "PROBNP" in the last 8760 hours. HbA1C: No results for input(s): "HGBA1C" in the last 72 hours. CBG: No results for input(s): "GLUCAP" in the last 168 hours. Lipid Profile: No results for input(s): "CHOL", "HDL", "LDLCALC", "  TRIG", "CHOLHDL", "LDLDIRECT" in the last 72 hours. Thyroid Function Tests: No results for input(s): "TSH", "T4TOTAL", "FREET4", "T3FREE", "THYROIDAB" in the last 72 hours. Anemia Panel: No results for input(s): "VITAMINB12", "FOLATE", "FERRITIN", "TIBC", "IRON", "RETICCTPCT" in the last 72 hours. Sepsis Labs: Recent Labs  Lab 09/07/22 1350  LATICACIDVEN 1.8    Recent Results (from the past 240 hour(s))  Urine culture     Status: None   Collection Time: 08/31/22 12:07 PM   Specimen: Urine, Clean Catch  Result Value Ref Range Status   Specimen Description   Final    URINE, CLEAN CATCH Performed at [facility], [address] [place], [place] [address]    Special Requests   Final    NONE Performed at [facility], [address], [place], [place] [address]    Culture   Final    NO GROWTH Performed at [facility], [place] [address] [place], [place] [address]    Report Status 09/01/2022 FINAL  Final  Blood Culture (routine x 2)     Status: None (Preliminary result)   Collection Time: 09/07/22  1:59 PM   Specimen: BLOOD  Result Value Ref Range Status   Specimen Description BLOOD BLOOD LEFT WRIST  Final   Special Requests   Final    BOTTLES DRAWN AEROBIC AND ANAEROBIC Blood Culture adequate volume   Culture   Final    NO GROWTH < 12  HOURS Performed at [facility], [address] [place], [place] [address]    Report Status PENDING  Incomplete  Resp panel by RT-PCR (RSV, Flu A&B, Covid) Anterior Nasal Swab     Status: None   Collection Time: 09/07/22  2:28 PM   Specimen: Anterior Nasal Swab  Result Value Ref Range Status   SARS Coronavirus 2 by RT PCR NEGATIVE NEGATIVE Final    Comment: (NOTE) SARS-CoV-2 target nucleic acids are NOT DETECTED.  The SARS-CoV-2 RNA is generally detectable in upper respiratory specimens during the acute phase of infection. The lowest concentration of SARS-CoV-2 viral copies this assay can detect is 138 copies/mL. A negative result does not preclude SARS-Cov-2 infection and should not be used as the sole basis for treatment or other patient management decisions. A negative result may occur with  improper specimen collection/handling, submission of specimen other than nasopharyngeal swab, presence of viral mutation(s) within the areas targeted by this assay, and inadequate number of viral copies(<138 copies/mL). A negative result must be combined with clinical observations, patient history, and epidemiological information. The expected result is Negative.  Fact Sheet for Patients:  EntrepreneurPulse.com.au  Fact Sheet for Healthcare Providers:  IncredibleEmployment.be  This test is no t yet approved or cleared by the [place] FDA and  has been authorized for detection and/or diagnosis of SARS-CoV-2 by FDA under an Emergency Use Authorization (EUA). This EUA will remain  in effect (meaning this test can be used) for the duration of the COVID-19 declaration under Section 564(b)(1) of the Act, 21 U.S.C.section 360bbb-3(b)(1), unless the authorization is terminated  or revoked sooner.       Influenza A by PCR NEGATIVE NEGATIVE Final   Influenza B by PCR NEGATIVE NEGATIVE Final    Comment: (NOTE) The Xpert Xpress  SARS-CoV-2/FLU/RSV plus assay is intended as an aid in the diagnosis of influenza from Nasopharyngeal swab specimens and should not be used as a sole basis for treatment. Nasal washings and aspirates are unacceptable for Xpert Xpress SARS-CoV-2/FLU/RSV testing.  Fact Sheet  for Patients: EntrepreneurPulse.com.au  Fact Sheet for Healthcare Providers: IncredibleEmployment.be  This test is not yet approved or cleared by the [place] FDA and has been authorized for detection and/or diagnosis of SARS-CoV-2 by FDA under an Emergency Use Authorization (EUA). This EUA will remain in effect (meaning this test can be used) for the duration of the COVID-19 declaration under Section 564(b)(1) of the Act, 21 U.S.C. section 360bbb-3(b)(1), unless the authorization is terminated or revoked.     Resp Syncytial Virus by PCR NEGATIVE NEGATIVE Final    Comment: (NOTE) Fact Sheet for Patients: EntrepreneurPulse.com.au  Fact Sheet for Healthcare Providers: IncredibleEmployment.be  This test is not yet approved or cleared by the [place] FDA and has been authorized for detection and/or diagnosis of SARS-CoV-2 by FDA under an Emergency Use Authorization (EUA). This EUA will remain in effect (meaning this test can be used) for the duration of the COVID-19 declaration under Section 564(b)(1) of the Act, 21 U.S.C. section 360bbb-3(b)(1), unless the authorization is terminated or revoked.  Performed at [facility], [address] [place], [place] [address]   Blood Culture (routine x 2)     Status: None (Preliminary result)   Collection Time: 09/07/22  2:29 PM   Specimen: BLOOD  Result Value Ref Range Status   Specimen Description BLOOD RIGHT ANTECUBITAL  Final   Special Requests   Final    BOTTLES DRAWN AEROBIC AND ANAEROBIC Blood Culture adequate volume   Culture   Final    NO GROWTH < 12 HOURS Performed  at [facility], [address], [place], [place] [address]    Report Status PENDING  Incomplete     Radiology Studies: PERIPHERAL VASCULAR CATHETERIZATION  Result Date: 09/08/2022 See surgical note for result.  CT Angio Chest PE W and/or Wo Contrast  Result Date: 09/07/2022 CLINICAL DATA:  Stage IV ureteral carcinoma. Hypotension and syncope. * Tracking Code: BO * EXAM: CT ANGIOGRAPHY CHEST WITH CONTRAST TECHNIQUE: Multidetector CT imaging of the chest was performed using the standard protocol during bolus administration of intravenous contrast. Multiplanar CT image reconstructions and MIPs were obtained to evaluate the vascular anatomy. RADIATION DOSE REDUCTION: This exam was performed according to the departmental dose-optimization program which includes automated exposure control, adjustment of the mA and/or kV according to patient size and/or use of iterative reconstruction technique. CONTRAST:  141m OMNIPAQUE IOHEXOL 350 MG/ML SOLN COMPARISON:  PET-CT 08/14/2022.  Chest CT 08/05/2022 noncontrast FINDINGS: Cardiovascular: Heart is nonenlarged. Trace pericardial fluid. Right upper chest port in place. The port is accessed. In the right lower lobe is a filling defect along the medial branch of the right lower lobe pulmonary artery consistent with a subsegmental pulmonary embolism. No other larger and more central emboli are identified. Mediastinum/Nodes: No specific abnormal lymph node enlargement identified in the axillary regions. There are several abnormal mediastinal and hilar lymph nodes once again identified. These are increased from the study of 08/05/2022. Example to the left of the main pulmonary artery on series 4, image 67 measures 2.3 x 1.7 cm. Right hilar node anteriorly on series 4, image 68 measures 15 by 12 mm. Left hilar node on image 80 of series 4 measures 16 by 11 mm. Several other nodes are identified. There are also some prominent nodes along the thoracic inlet on the  left side. All of these are also larger than the PET-CT scan of 08/14/22. Normal caliber thoracic esophagus. Lungs/Pleura: No pneumothorax or consolidation. Trace right-sided pleural fluid. Once again there are  numerous bilateral lung nodules of varying size but the vast majority are under a cm. At least 15 nodules are identified. There is 1 lesion along the right lower lobe on image 95 of series 6 which is cavitary measuring 2.3 by 1.9 cm. Example nodule in the left lung, lower lobe on series 6, image 97 measures 12 by 10 mm. All of these are increased in size and number. Upper Abdomen: There are multiple perinephric space nodules involving the right kidney as well as poor visualization of the parenchyma of the right kidney consistent with known mass lesions. There is also nodular enlargement of the adrenal glands which also appears to have increased. Musculoskeletal: Mild degenerative changes along the spine. Review of the MIP images confirms the above findings. Critical Value/emergent results were called by telephone at the time of interpretation on 09/07/2022 at 2:07 pm to provider Dr. Joni Fears, who verbally acknowledged these results. IMPRESSION: Progression of metastatic disease. This includes lung nodules, mediastinal lymph nodes and upper abdominal lesions. Right lower lobe pulmonary emboli. Please see separate dictation of abdomen and pelvis CT from same day Electronically Signed   By: Jill Side M.D.   On: 09/07/2022 17:21   CT ABDOMEN PELVIS W CONTRAST  Result Date: 09/07/2022 CLINICAL DATA:  Epigastric pain again leg swelling. 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 * Tracking Code: BO * EXAM: CT ABDOMEN AND PELVIS WITH CONTRAST TECHNIQUE: Multidetector CT imaging of the abdomen and pelvis was performed using the standard protocol following bolus administration of intravenous contrast. RADIATION DOSE REDUCTION: This exam was performed according to the departmental dose-optimization  program which includes automated exposure control, adjustment of the mA and/or kV according to patient size and/or use of iterative reconstruction technique. CONTRAST:  134m OMNIPAQUE IOHEXOL 350 MG/ML SOLN COMPARISON:  PET-CT 08/14/2022.  Standard CT 07/31/2022 FINDINGS: Lower chest: Please see separate dictation of chest CT angiogram from same date Hepatobiliary: Liver metastases are noted. Central lesion in the right hepatic lobe with some peripheral segmental biliary duct ectasia is now identified on series 2, image 15 measuring 2.2 x 1.6 cm. The duct dilatation is likely related to secondary mass effect. Small lesions are also seen peripherally in the right hepatic lobe on image 17 and 21 in segment 4. Portal vein is patent. Gallbladder is nondilated. Pancreas: Unremarkable. No pancreatic ductal dilatation or surrounding inflammatory changes. Spleen: Normal in size without focal abnormality. Adrenals/Urinary Tract: Increasing left adrenal nodule. On image 26 of series 2 this measures 17 x 15 mm. There is also an increasing right adrenal metastasis. Left kidney has some small Bosniak 1 cysts, unchanged from prior. No left-sided renal collecting system dilatation. Left ureter has a normal course and caliber down to the bladder. Preserved contours of the urinary bladder. There is large infiltrative mass involving the right kidney along its posterior aspect. Previously this measured 6.0 x 4.6 cm and on the current examination 6.5 by 4.4 cm. Once again there is ill-defined margins extending into the renal hilum collecting system. In addition there is marked increase in the number and extent of perinephric space nodules diffusely. Example superiorly on series 28 of series 2 measuring 15 by 14 mm. Previously this measured less than 5 mm. There is significant retroperitoneal stranding identified which is significantly increased from prior. Stomach/Bowel: Large bowel has a normal course and caliber. Scattered  diverticula. No smaller large bowel dilatation. The stomach is underdistended. Vascular/Lymphatic: Normal caliber aorta with scattered vascular calcifications. There is significant increase in retroperitoneal abnormal  lymph nodes. Example retrocaval on series 2 image 37 measures 4.7 x 2.7 cm. On the patient's prior PET-CT this measured 4.0 x 2.2 cm. There is several others which have increased as well and now cause significant compression of the IVC. Another example is a left para-aortic node which previously would have had dimensions of 11 mm in transverse and today 14 mm on series 2, image 36. Of note caudal to the area of narrowing of the IVC the veins are distended all the way into the area of the lower extremities. There is also edema of the right lower extremity. Please correlate with separate ultrasound of the legs for DVT from same day. Of note, 1 of the retrocaval mass lesions identified does appear to cause deformity of the adjacent vertebral body at L2 and extend into the vertebral body itself. Reproductive: Enlarged prostate with mass effect along the base of the bladder. Other: Anasarca.  Nonspecific presacral stranding. Musculoskeletal: Degenerative changes of the spine and pelvis. Pars defects again L5. Once again there is invasion of a retroperitoneal node into the margin of the vertebral body at L2 best seen on sagittal image 54 and axial image 38. Again new from previous examinations. Critical Value/emergent results were called by telephone at the time of interpretation on 09/07/2022 at 2:19 pm to provider Dr. Joni Fears, who verbally acknowledged these results. IMPRESSION: Marked increase of neoplasm metastatic disease including size of the primary right renal mass. Significant increase in size number perinephric nodules, bilateral adrenal masses, retroperitoneal nodes and liver lesions. The retroperitoneal lymphadenopathy does cause severe narrowing of the IVC focally and associated distention of  caudal veins including the pelvis. Please correlate with separate DVT ultrasound. In addition 1 of the retroperitoneal nodes does appear to extend directly into and invade the L2 vertebral body. No bowel obstruction or free air. Scattered anasarca including along the retroperitoneum and presacral regions. Electronically Signed   By: Jill Side M.D.   On: 09/07/2022 17:21   US Venous Img Lower Right (DVT Study)  Result Date: 09/07/2022 CLINICAL DATA:  Acute right lower extremity pain and edema. EXAM: Right LOWER EXTREMITY VENOUS DOPPLER ULTRASOUND TECHNIQUE: Gray-scale sonography with graded compression, as well as color Doppler and duplex ultrasound were performed to evaluate the lower extremity deep venous systems from the level of the common femoral vein and including the common femoral, femoral, profunda femoral, popliteal and calf veins including the posterior tibial, peroneal and gastrocnemius veins when visible. The superficial great saphenous vein was also interrogated. Spectral Doppler was utilized to evaluate flow at rest and with distal augmentation maneuvers in the common femoral, femoral and popliteal veins. COMPARISON:  None Available. FINDINGS: Contralateral Common Femoral Vein: Respiratory phasicity is normal and symmetric with the symptomatic side. No evidence of thrombus. Normal compressibility. Common Femoral Vein: Noncompressible with no flow consistent with occlusive thrombus. Saphenofemoral Junction: Noncompressible with no flow consistent with occlusive thrombus. Profunda Femoral Vein: Noncompressible with no flow consistent with occlusive thrombus. Femoral Vein: Only partially compressible with possible rouleaux flow present. Popliteal Vein: Only partially compressible with possible rouleaux low flow present. Calf Veins: No evidence of thrombus. Normal compressibility and flow on color Doppler imaging. Superficial Great Saphenous Vein: No evidence of thrombus. Normal compressibility.  Venous Reflux:  None. Other Findings: There also appears to be noncompressible thrombus in the visualized portions of the right common and external iliac veins. IMPRESSION: Occlusive deep venous thrombosis is noted in the right common femoral and profundus femoral veins as well as the  right saphenofemoral junction. The right superficial femoral and popliteal veins are only partially compressible with probable rouleaux flow present. There also appears to be noncompressible thrombus within the visualized portions of the right common and external iliac veins. Electronically Signed   By: Marijo Conception M.D.   On: 09/07/2022 15:46   DG Chest Port 1 View  Result Date: 09/07/2022 CLINICAL DATA:  Provided history: Questionable sepsis. Evaluate for abnormality. EXAM: PORTABLE CHEST 1 VIEW COMPARISON:  PET-CT 08/14/2022. FINDINGS: Right chest infusion port catheter with tip at the level of the superior cavoatrial junction. Heart size within normal limits. Numerous bilateral pulmonary nodules consistent with known metastatic disease (as demonstrated on the recent prior PET-CT of 08/14/2022). No appreciable superimposed acute airspace consolidation. No evidence of pleural effusion or pneumothorax. No acute bony abnormality identified. Levocurvature of the thoracic spine. IMPRESSION: Numerous bilateral pulmonary nodules consistent with known metastatic disease (as demonstrated on the recent prior PET-CT of 08/14/2022). No appreciable superimposed acute airspace consolidation. Electronically Signed   By: Kellie Simmering D.O.   On: 09/07/2022 14:23    Scheduled Meds:  Chlorhexidine Gluconate Cloth  6 each Topical Daily   pantoprazole  40 mg Oral Daily   tamsulosin  0.4 mg Oral Daily   Continuous Infusions:  sodium chloride     heparin 1,700 Units/hr (09/09/22 0349)    LOS: 1 day   Raiford Noble, DO [facility] Available via Epic secure chat 7am-7pm After these hours, please refer to coverage provider listed  on [email or web address] 09/09/2022, 8:03 AM

## 2022-09-09 NOTE — Consult Note (Signed)
ANTICOAGULATION CONSULT NOTE - Initial Consult  Pharmacy Consult for heparin infusion Indication: DVT  Allergies  Allergen Reactions   Amoxicillin Itching   Atenolol Diarrhea    Other reaction(s): HIVES   Other Nausea And Vomiting    Navy Beans   Oxycodone     Other reaction(s): HIVES   Shellfish Allergy Itching and Swelling    Patient Measurements: Height: '5\' 9"'$  (175.3 cm) Weight: 79 kg (174 lb 2.6 oz) IBW/kg (Calculated) : 70.7 Heparin Dosing Weight: 79 kg  Vital Signs: Temp: 98 F (36.7 C) (02/28 0756) Temp Source: Oral (02/28 0756) BP: 124/78 (02/28 0756) Pulse Rate: 84 (02/28 0756)  Labs: Recent Labs    09/07/22 1006 09/07/22 1006 09/07/22 1348 09/07/22 1350 09/07/22 1428 09/07/22 1856 09/08/22 0019 09/08/22 0504 09/08/22 0701 09/08/22 1332 09/09/22 0152 09/09/22 1018  HGB 11.5*   < >  --  11.5*  --   --   --  9.6*  --   --  8.4*  --   HCT 33.3*  --   --  34.0*  --   --   --  28.1*  --   --  24.4*  --   PLT 213  --   --  232  --   --   --  169  --   --  125*  --   APTT  --   --   --   --  28  --   --   --   --   --   --   --   LABPROT  --   --   --   --  15.6*  --   --   --   --   --   --   --   INR  --   --   --   --  1.3*  --   --   --   --   --   --   --   HEPARINUNFRC  --   --   --   --   --   --    < >  --    < > 0.39 0.28* 0.63  CREATININE 1.20  --   --  1.18  --   --   --  1.26*  --   --   --   --   TROPONINIHS  --   --  17  --   --  20*  --   --   --   --   --   --    < > = values in this interval not displayed.     Estimated Creatinine Clearance: 58.4 mL/min (A) (by C-G formula based on SCr of 1.26 mg/dL (H)).   Medical History: Past Medical History:  Diagnosis Date   Cancer [facility]) 2013   bladder   Diverticulitis    History of chickenpox    History of measles    History of mumps    Hyperlipidemia    Hypertension     Medications:  PTA: N/A Inpatient: heparin infusion (2/26 >>>) Allergies: No AC/APT related  allergies  Assessment: 67 yearold male presents to ED with compliant of severe leg pain. US DVT Study on right lower extremity showing Occlusive DVT in the right common femoral and profundus femoral veins as well as the right saphenofemoral junction. Pharmacy consulted for management of heparin infusion in the setting of DVT.   Date Time aPTT/HL Rate/Comment 2/27  0019      0.26              SUBtherapeutic @ 1400 u/hr 2/27 0701    0.34  Therapeutic x 1 2/28     0152      0.28              SUBtherapeutic X 1  2/28 1018    0.63  Therapeutic x 1  Goal of Therapy:  Heparin level 0.3-0.7 units/ml Monitor platelets by anticoagulation protocol: Yes   Plan: HL therapeutic x 1 Continue heparin drip at 1700 units/hr Recheck heparin level in 6 hours to confirm Continue to monitor H&H and platelets daily while on heparin gtt.  Lorin Picket, PharmD Clinical Pharmacist 09/09/2022 11:38 AM

## 2022-09-09 NOTE — Consult Note (Signed)
ANTICOAGULATION CONSULT NOTE - Initial Consult  Pharmacy Consult for heparin infusion Indication: DVT  Allergies  Allergen Reactions   Amoxicillin Itching   Atenolol Diarrhea    Other reaction(s): HIVES   Other Nausea And Vomiting    Navy Beans   Oxycodone     Other reaction(s): HIVES   Shellfish Allergy Itching and Swelling    Patient Measurements: Height: '5\' 9"'$  (175.3 cm) Weight: 79 kg (174 lb 2.6 oz) IBW/kg (Calculated) : 70.7 Heparin Dosing Weight: 79 kg  Vital Signs: Temp: 98.1 F (36.7 C) (02/27 2307) Temp Source: Oral (02/27 2307) BP: 99/66 (02/28 0022) Pulse Rate: 102 (02/28 0022)  Labs: Recent Labs    09/07/22 1006 09/07/22 1006 09/07/22 1348 09/07/22 1350 09/07/22 1428 09/07/22 1856 09/08/22 0019 09/08/22 0504 09/08/22 0701 09/08/22 1332 09/09/22 0152  HGB 11.5*   < >  --  11.5*  --   --   --  9.6*  --   --  8.4*  HCT 33.3*  --   --  34.0*  --   --   --  28.1*  --   --  24.4*  PLT 213  --   --  232  --   --   --  169  --   --  125*  APTT  --   --   --   --  28  --   --   --   --   --   --   LABPROT  --   --   --   --  15.6*  --   --   --   --   --   --   INR  --   --   --   --  1.3*  --   --   --   --   --   --   HEPARINUNFRC  --   --   --   --   --   --    < >  --  0.34 0.39 0.28*  CREATININE 1.20  --   --  1.18  --   --   --  1.26*  --   --   --   TROPONINIHS  --   --  17  --   --  20*  --   --   --   --   --    < > = values in this interval not displayed.     Estimated Creatinine Clearance: 58.4 mL/min (A) (by C-G formula based on SCr of 1.26 mg/dL (H)).   Medical History: Past Medical History:  Diagnosis Date   Cancer [facility]) 2013   bladder   Diverticulitis    History of chickenpox    History of measles    History of mumps    Hyperlipidemia    Hypertension     Medications:  PTA: N/A Inpatient: heparin infusion (2/26 >>>) Allergies: No AC/APT related allergies  Assessment: 67 yearold male presents to ED with compliant of severe  leg pain. US DVT Study on right lower extremity showing Occlusive DVT in the right common femoral and profundus femoral veins as well as the right saphenofemoral junction. Pharmacy consulted for management of heparin infusion in the setting of DVT.   Date Time aPTT/HL Rate/Comment 2/27     0019      0.26              SUBtherapeutic @ 1400 u/hr 2/27 0701  0.34  Therapeutic x 1 2/28     0152      0.28              SUBtherapeutic X 1    Goal of Therapy:  Heparin level 0.3-0.7 units/ml Monitor platelets by anticoagulation protocol: Yes   Plan:  2/28:  HL @ 0152 = 0.28, SUBtherapeutic  Will order heparin 1200 units IV X 1 bolus and increase drip rate to 1700 units/hr.  Recheck heparin level in 6 hours after rate change.  Continue to monitor H&H and platelets daily while on heparin gtt.  Orene Desanctis, PharmD Clinical Pharmacist 09/09/2022 3:22 AM

## 2022-09-10 DIAGNOSIS — I824Y1 Acute embolism and thrombosis of unspecified deep veins of right proximal lower extremity: Secondary | ICD-10-CM | POA: Diagnosis not present

## 2022-09-10 LAB — ECHOCARDIOGRAM COMPLETE
Area-P 1/2: 4.96 cm2
Height: 69 in
S' Lateral: 2.9 cm
Weight: 2786.61 oz

## 2022-09-10 LAB — COMPREHENSIVE METABOLIC PANEL
ALT: 34 U/L (ref 0–44)
AST: 24 U/L (ref 15–41)
Albumin: 2.6 g/dL — ABNORMAL LOW (ref 3.5–5.0)
Alkaline Phosphatase: 130 U/L — ABNORMAL HIGH (ref 38–126)
Anion gap: 7 (ref 5–15)
BUN: 21 mg/dL (ref 8–23)
CO2: 23 mmol/L (ref 22–32)
Calcium: 8.2 mg/dL — ABNORMAL LOW (ref 8.9–10.3)
Chloride: 103 mmol/L (ref 98–111)
Creatinine, Ser: 1.13 mg/dL (ref 0.61–1.24)
GFR, Estimated: >60 mL/min (ref >60)
Glucose, Bld: 90 mg/dL (ref 70–99)
Potassium: 3.5 mmol/L (ref 3.5–5.1)
Sodium: 133 mmol/L — ABNORMAL LOW (ref 135–145)
Total Bilirubin: 0.5 mg/dL (ref 0.3–1.2)
Total Protein: 5.6 g/dL — ABNORMAL LOW (ref 6.5–8.1)

## 2022-09-10 LAB — CBC WITH DIFFERENTIAL/PLATELET
Abs Immature Granulocytes: 0.06 10*3/uL (ref 0.00–0.07)
Basophils Absolute: 0 10*3/uL (ref 0.0–0.1)
Basophils Relative: 0 %
Eosinophils Absolute: 0.1 10*3/uL (ref 0.0–0.5)
Eosinophils Relative: 2 %
HCT: 23.1 % — ABNORMAL LOW (ref 39.0–52.0)
Hemoglobin: 7.8 g/dL — ABNORMAL LOW (ref 13.0–17.0)
Immature Granulocytes: 1 %
Lymphocytes Relative: 14 %
Lymphs Abs: 0.9 10*3/uL (ref 0.7–4.0)
MCH: 30.5 pg (ref 26.0–34.0)
MCHC: 33.8 g/dL (ref 30.0–36.0)
MCV: 90.2 fL (ref 80.0–100.0)
Monocytes Absolute: 0.6 10*3/uL (ref 0.1–1.0)
Monocytes Relative: 10 %
Neutro Abs: 4.6 10*3/uL (ref 1.7–7.7)
Neutrophils Relative %: 73 %
Platelets: 135 10*3/uL — ABNORMAL LOW (ref 150–400)
RBC: 2.56 MIL/uL — ABNORMAL LOW (ref 4.22–5.81)
RDW: 11.8 % (ref 11.5–15.5)
WBC: 6.3 10*3/uL (ref 4.0–10.5)
nRBC: 0 % (ref 0.0–0.2)

## 2022-09-10 LAB — MAGNESIUM: Magnesium: 2.1 mg/dL (ref 1.7–2.4)

## 2022-09-10 LAB — PHOSPHORUS: Phosphorus: 2.9 mg/dL (ref 2.5–4.6)

## 2022-09-10 LAB — HEPARIN LEVEL (UNFRACTIONATED): Heparin Unfractionated: 0.53 IU/mL (ref 0.30–0.70)

## 2022-09-10 NOTE — TOC Initial Note (Signed)
Transition of Care [facility]) - Initial/Assessment Note    Patient Details  Name: [name] MRN: [id number] Date of Birth: 1956-04-17  Transition of [facility]) CM/SW Contact:    Gerilyn Pilgrim, LCSW Phone Number: 09/10/2022, 3:35 PM  Clinical Narrative:   CSW spoke with pt wife and pt regarding HH recommendations. Pt is not interested in this nor is he interested in [facility]. Wife reports they have all DME needed at home including hospital bed, BSC, 2 gait belts, a ramp and a shower stool. Pt is still active with Dr. Caryn Section for Primary Care and uses [facility] [place]. No additional needs at this time. CSW will continue to follow for updates.                       Patient Goals and CMS Choice            Expected Discharge Plan and Services                                              Prior Living Arrangements/Services                       Activities of Daily Living Home Assistive Devices/Equipment: Gilford Rile (specify type) ADL Screening (condition at time of admission) Patient's cognitive ability adequate to safely complete daily activities?: Yes Is the patient deaf or have difficulty hearing?: No Does the patient have difficulty seeing, even when wearing glasses/contacts?: No Does the patient have difficulty concentrating, remembering, or making decisions?: No Patient able to express need for assistance with ADLs?: Yes Does the patient have difficulty dressing or bathing?: No Independently performs ADLs?: Yes (appropriate for developmental age) Does the patient have difficulty walking or climbing stairs?: Yes Weakness of Legs: Both Weakness of Arms/Hands: None  Permission Sought/Granted                  Emotional Assessment              Admission diagnosis:  DVT (deep venous thrombosis) (HCC) [I82.409] Acute deep vein thrombosis (DVT) of femoral vein of right lower extremity ([place]) [I82.411] Syncope, unspecified syncope type  [R55] Patient Active Problem List   Diagnosis Date Noted   Palliative care encounter 09/08/2022   DVT (deep venous thrombosis) ([place]) 09/07/2022   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[place]) 08/19/2022   Enlarged prostate 06/26/2020   Hematuria 06/10/2020   Aortic atherosclerosis ([place]) 12/13/2017   Emphysema of lung ([place]) 12/13/2017   Gastroesophageal reflux disease 11/17/2017   Stopped smoking with greater than 30 pack year history 03/20/2016   Personal history of bladder cancer 03/19/2016   Hyperlipidemia 03/19/2016   Diverticulitis of sigmoid colon 03/19/2016   Synovial cyst of popliteal space 03/19/2016   Benign localized hyperplasia of prostate with urinary obstruction and other lower urinary tract symptoms (LUTS)(600.21) 03/08/2013   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 [address]Phone: [phone]: [phone]    Social Determinants of Health (SDOH) Social History: SDOH Screenings   Food Insecurity: No Food Insecurity (09/07/2022)  Housing: Low Risk  (09/07/2022)  Transportation Needs: No Transportation Needs (09/07/2022)  Utilities: Not  At Risk (09/07/2022)  Alcohol Screen: Low Risk  (12/02/2021)  Depression (PHQ2-9): Low Risk  (12/02/2021)  Tobacco Use: Medium Risk (09/09/2022)   SDOH Interventions:     Readmission Risk Interventions     No data to display

## 2022-09-10 NOTE — Evaluation (Signed)
Physical Therapy Evaluation Patient Details Name: Jeffrey Fernandez MRN: [id number] DOB: 1955-07-17 Today's Date: 09/10/2022  History of Present Illness  Patient is a 67 year old male with stage IV urothelial carcinoma, failure to thrive, DVT/PE. S/p mechanical thrombectomy RLE.  Clinical Impression  Patient was agreeable to PT with encouragement from therapy and spouse. He was independent up until about a week ago when he started feeling fatigued and weaker that usual, requiring rolling walker for ambulation.  He does not require physical assistance for bed mobility or transfers today. Encouraged patient to use rolling walker with ambulation for safety. He walked a short distance in the room and declined walking again due to fatigue. Heart rate up to 134 with mobility. Educated patient on LE exercises and energy conservation techniques. Recommend PT follow up while in the hospital to maximize independence and decrease caregiver burden.      Recommendations for follow up therapy are one component of a multi-disciplinary discharge planning process, led by the attending physician.  Recommendations may be updated based on patient status, additional functional criteria and insurance authorization.  Follow Up Recommendations Home health PT      Assistance Recommended at Discharge PRN  Patient can return home with the following  Help with stairs or ramp for entrance;Assist for transportation;Assistance with cooking/housework    Equipment Recommendations None recommended by PT  Recommendations for Other Services       Functional Status Assessment Patient has had a recent decline in their functional status and demonstrates the ability to make significant improvements in function in a reasonable and predictable amount of time.     Precautions / Restrictions Precautions Precautions: Fall Restrictions Weight Bearing Restrictions: No      Mobility  Bed Mobility Overal bed mobility: Modified  Independent                  Transfers Overall transfer level: Needs assistance Equipment used: Rolling walker (2 wheels) Transfers: Sit to/from Stand Sit to Stand: Supervision           General transfer comment: encouraged use of rolling walker    Ambulation/Gait Ambulation/Gait assistance: Min guard, Supervision Gait Distance (Feet): 15 Feet Assistive device: Rolling walker (2 wheels) Gait Pattern/deviations: Step-through pattern Gait velocity: decreased     General Gait Details: slow but steady with rolling walker. heart rate up to 134bpm with activity. educated patient on energy conservation strategies to use in home setting and routine, short distance ambulation for conditioning.  Stairs            Wheelchair Mobility    Modified Rankin (Stroke Patients Only)       Balance Overall balance assessment: Needs assistance Sitting-balance support: No upper extremity supported, Feet supported Sitting balance-Leahy Scale: Normal     Standing balance support: During functional activity, Single extremity supported Standing balance-Leahy Scale: Good                               Pertinent Vitals/Pain Pain Assessment Pain Assessment: No/denies pain    Home Living Family/patient expects to be discharged to:: Private residence Living Arrangements: Spouse/significant other Available Help at Discharge: Family;Available 24 hours/day Type of Home: House Home Access: Ramped entrance       Home Layout: Two level;Able to live on main level with bedroom/bathroom Home Equipment: Rolling Walker (2 wheels);Rollator (4 wheels);Shower seat;BSC/3in1;Hospital bed;Wheelchair - manual      Prior Function Prior Level of Function :  Independent/Modified Independent             Mobility Comments: RW used recently for ~ 1 week       Hand Dominance        Extremity/Trunk Assessment   Upper Extremity Assessment Upper Extremity Assessment:  Overall WFL for tasks assessed    Lower Extremity Assessment Lower Extremity Assessment: Overall WFL for tasks assessed       Communication   Communication: No difficulties  Cognition Arousal/Alertness: Awake/alert Behavior During Therapy: WFL for tasks assessed/performed Overall Cognitive Status: Within Functional Limits for tasks assessed                                          General Comments General comments (skin integrity, edema, etc.): supportive spouse at the bedside    Exercises     Assessment/Plan    PT Assessment Patient needs continued PT services  PT Problem List Decreased strength;Decreased activity tolerance;Decreased balance;Decreased mobility;Decreased safety awareness;Cardiopulmonary status limiting activity       PT Treatment Interventions DME instruction;Gait training;Stair training;Functional mobility training;Therapeutic activities;Therapeutic exercise;Balance training;Cognitive remediation;Neuromuscular re-education;Patient/family education    PT Goals (Current goals can be found in the Care Plan section)  Acute Rehab PT Goals Patient Stated Goal: to have more energy PT Goal Formulation: With patient/family Time For Goal Achievement: 09/24/22 Potential to Achieve Goals: Good    Frequency Min 2X/week     Co-evaluation PT/OT/SLP Co-Evaluation/Treatment: Yes Reason for Co-Treatment: For patient/therapist safety PT goals addressed during session: Mobility/safety with mobility OT goals addressed during session: ADL's and self-care       AM-PAC PT "6 Clicks" Mobility  Outcome Measure Help needed turning from your back to your side while in a flat bed without using bedrails?: None Help needed moving from lying on your back to sitting on the side of a flat bed without using bedrails?: A Little Help needed moving to and from a bed to a chair (including a wheelchair)?: A Little Help needed standing up from a chair using your arms  (e.g., wheelchair or bedside chair)?: A Little Help needed to walk in hospital room?: A Little Help needed climbing 3-5 steps with a railing? : A Little 6 Click Score: 19    End of Session   Activity Tolerance: Patient limited by fatigue Patient left: in bed;with call bell/phone within reach;with family/visitor present Nurse Communication: Mobility status PT Visit Diagnosis: Muscle weakness (generalized) (M62.81);Unsteadiness on feet (R26.81)    Time: [id number] PT Time Calculation (min) (ACUTE ONLY): 27 min   Charges:   PT Evaluation $PT Eval Low Complexity: 1 Low PT Treatments $Therapeutic Activity: 8-22 mins        Minna Merritts, PT, MPT   Percell Locus 09/10/2022, 12:04 PM

## 2022-09-10 NOTE — Progress Notes (Signed)
PROGRESS NOTE    Jeffrey Fernandez  [id number] DOB: 10-Mar-1956 DOA: 09/07/2022 PCP: Birdie Sons, MD    Brief Narrative:  67 y.o. 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  Patient endorses poor p.o. intake since chemotherapy last week.  Progressive fatigue.   Patient noted his right lower extremity to be edematous and painful with decreased range of motion.  Sent from cancer center to emergency room.  In the ED found to have extensive lower extremity DVT extending into multiple veins.   On my evaluation patient appears fatigued and weak otherwise stable.  He endorses pain in right lower extremity.  No other complaints noted at time my evaluation.  Blood pressure low normal otherwise stable.  Normal heart rate.  No oxygen requirement.  On 529: Patient is status post a right lower extremity thrombectomy.  Doing well overall.   Assessment & Plan:   Principal Problem:   DVT (deep venous thrombosis) (HCC) Active Problems:   Essential (primary) hypertension   Personal history of bladder cancer   Gastroesophageal reflux disease   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[place])   Palliative care encounter  DVT RLL PE Patient presented with asymmetric right lower extremity swelling.  Ultrasound confirmed DVT on presentation.  CT angio ordered after patient was admitted.  Right lower lobe PE noted.  I was never called about this pulmonary embolism.  Radiology notes indicate that it was called out to EDP at 2 PM 2/26 however the study was not completed until after 4 PM. Plan: Continue heparin GTT NPO after 12 As needed pain control Return to the vascular Lab on Friday for repeat IVC Gram with possible stent placement   Near syncope Hypotension Likely in the setting of poor p.o.  intake/anorexia IVF stopped Fall precautions   Stage IV urothelial carcinoma Seen outpatient by Dr. Grayland Ormond and Altha Harm nurse practitioner palliative care Notified Altha Harm of patient's admission CT angio thorax demonstrates progression of underlying metastatic disease CT abdomen with similar results as per metastatic disease Appreciate oncology consultation   Essential hypertension BP meds on hold   BPH PTA Flomax   Intractable nausea and vomiting As needed Zofran Daily Protonix     DVT prophylaxis: Heparin GTT Code Status: Full code Family Communication: Family member at bedside 2/27, 2/29 Disposition Plan: Status is: Inpatient Remains inpatient appropriate because: DVT/PE on heparin drip     Level of care: Med-Surg  Consultants:  Vascular surgery  Procedures:  Vascular thrombectomy  Antimicrobials: None   Subjective: Seen and examined.  Lower extremity wraps.  Pain improved.  Swelling improved  Objective: Vitals:   09/09/22 2320 09/10/22 0253 09/10/22 0742 09/10/22 1125  BP: 124/84 125/78 127/81 117/78  Pulse: (!) 111 91 84 100  Resp: '19 19 18 16  '$ Temp: 98.8 F (37.1 C) 98.2 F (36.8 C) 98.2 F (36.8 C) 98.2 F (36.8 C)  TempSrc:   Oral   SpO2: 97% 97% 98% 98%  Weight:      Height:        Intake/Output Summary (Last 24 hours) at 09/10/2022 1147 Last data filed at 09/10/2022 1023 Gross per 24 hour  Intake 1643.73 ml  Output 250 ml  Net 1393.73 ml   Filed Weights   09/07/22 1344 09/08/22 1540  Weight: 79 kg 79 kg    Examination:  General exam: Appears calm and comfortable  Respiratory system: Clear to auscultation. Respiratory effort normal. Cardiovascular system: S1-S2, RRR, no murmurs, no pedal edema Gastrointestinal system: Soft, NT/ND, normal bowel sounds Central nervous system: Alert and oriented. No focal neurological deficits. Extremities: Right lower extremity surgical dressings.  Not removed.  Swelling  improved Skin: No rashes, lesions or ulcers Psychiatry: Judgement and insight appear normal. Mood & affect appropriate.     Data Reviewed: I have personally reviewed following labs and imaging studies  CBC: Recent Labs  Lab 09/07/22 1006 09/07/22 1350 09/08/22 0504 09/09/22 0152 09/10/22 0505  WBC 9.2 9.5 5.3 4.3 6.3  NEUTROABS 7.9* 8.8*  --   --  4.6  HGB 11.5* 11.5* 9.6* 8.4* 7.8*  HCT 33.3* 34.0* 28.1* 24.4* 23.1*  MCV 88.8 90.2 89.8 88.7 90.2  PLT 213 232 169 125* [id number]*   Basic Metabolic Panel: Recent Labs  Lab 09/07/22 1006 09/07/22 1350 09/08/22 0504 09/09/22 1018 09/10/22 0505  NA 124* 125* 129* 133* 133*  K 3.1* 4.5 3.1* 3.8 3.5  CL 86* 89* 93* 101 103  CO2 '25 26 27 24 23  '$ GLUCOSE 121* 156* 99 101* 90  BUN '17 18 20 22 21  '$ CREATININE 1.20 1.18 1.26* 1.10 1.13  CALCIUM 8.7* 8.5* 8.1* 8.7* 8.2*  MG 2.2  --   --  2.3 2.1  PHOS  --   --   --  3.1 2.9   GFR: Estimated Creatinine Clearance: 65.2 mL/min (by C-G formula based on SCr of 1.13 mg/dL). Liver Function Tests: Recent Labs  Lab 09/07/22 1006 09/07/22 1350 09/09/22 1018 09/10/22 0505  AST '22 23 20 24  '$ ALT 32 34 29 34  ALKPHOS 132* 134* 122 130*  BILITOT 0.9 1.2 0.6 0.5  PROT 7.1 7.2 6.1* 5.6*  ALBUMIN 3.1* 3.1* 2.8* 2.6*   No results for input(s): "LIPASE", "AMYLASE" in the last 168 hours. No results for input(s): "AMMONIA" in the last 168 hours. Coagulation Profile: Recent Labs  Lab 09/07/22 1428  INR 1.3*   Cardiac Enzymes: No results for input(s): "CKTOTAL", "CKMB", "CKMBINDEX", "TROPONINI" in the last 168 hours. BNP (last 3 results) No results for input(s): "PROBNP" in the last 8760 hours. HbA1C: No results for input(s): "HGBA1C" in the last 72 hours. CBG: No results for input(s): "GLUCAP" in the last 168 hours. Lipid Profile: No results for input(s): "CHOL", "HDL", "LDLCALC", "TRIG", "CHOLHDL", "LDLDIRECT" in the last 72 hours. Thyroid Function Tests: No results for input(s):  "TSH", "T4TOTAL", "FREET4", "T3FREE", "THYROIDAB" in the last 72 hours. Anemia Panel: No results for input(s): "VITAMINB12", "FOLATE", "FERRITIN", "TIBC", "IRON", "RETICCTPCT" in the last 72 hours. Sepsis Labs: Recent Labs  Lab 09/07/22 1350  LATICACIDVEN 1.8    Recent Results (from the past 240 hour(s))  Urine culture     Status: None   Collection Time: 08/31/22 12:07 PM   Specimen: Urine, Clean Catch  Result Value Ref Range Status   Specimen Description   Final    URINE, CLEAN CATCH Performed at [facility], [address], [place], [place] [address]    Special Requests   Final    NONE Performed at [facility], [address], [place], [place] [address]    Culture   Final    NO GROWTH Performed at [facility], [place] [address] [place], [place] [address]    Report Status 09/01/2022 FINAL  Final  Blood Culture (routine x 2)     Status: None (Preliminary result)  Collection Time: 09/07/22  1:59 PM   Specimen: BLOOD  Result Value Ref Range Status   Specimen Description BLOOD BLOOD LEFT WRIST  Final   Special Requests   Final    BOTTLES DRAWN AEROBIC AND ANAEROBIC Blood Culture adequate volume   Culture   Final    NO GROWTH 3 DAYS Performed at [facility], [address], [place], [place] [address]    Report Status PENDING  Incomplete  Resp panel by RT-PCR (RSV, Flu A&B, Covid) Anterior Nasal Swab     Status: None   Collection Time: 09/07/22  2:28 PM   Specimen: Anterior Nasal Swab  Result Value Ref Range Status   SARS Coronavirus 2 by RT PCR NEGATIVE NEGATIVE Final    Comment: (NOTE) SARS-CoV-2 target nucleic acids are NOT DETECTED.  The SARS-CoV-2 RNA is generally detectable in upper respiratory specimens during the acute phase of infection. The lowest concentration of SARS-CoV-2 viral copies this assay can detect is 138 copies/mL. A negative result does not preclude SARS-Cov-2 infection and should not be used as the sole basis for  treatment or other patient management decisions. A negative result may occur with  improper specimen collection/handling, submission of specimen other than nasopharyngeal swab, presence of viral mutation(s) within the areas targeted by this assay, and inadequate number of viral copies(<138 copies/mL). A negative result must be combined with clinical observations, patient history, and epidemiological information. The expected result is Negative.  Fact Sheet for Patients:  EntrepreneurPulse.com.au  Fact Sheet for Healthcare Providers:  IncredibleEmployment.be  This test is no t yet approved or cleared by the [place] FDA and  has been authorized for detection and/or diagnosis of SARS-CoV-2 by FDA under an Emergency Use Authorization (EUA). This EUA will remain  in effect (meaning this test can be used) for the duration of the COVID-19 declaration under Section 564(b)(1) of the Act, 21 U.S.C.section 360bbb-3(b)(1), unless the authorization is terminated  or revoked sooner.       Influenza A by PCR NEGATIVE NEGATIVE Final   Influenza B by PCR NEGATIVE NEGATIVE Final    Comment: (NOTE) The Xpert Xpress SARS-CoV-2/FLU/RSV plus assay is intended as an aid in the diagnosis of influenza from Nasopharyngeal swab specimens and should not be used as a sole basis for treatment. Nasal washings and aspirates are unacceptable for Xpert Xpress SARS-CoV-2/FLU/RSV testing.  Fact Sheet for Patients: EntrepreneurPulse.com.au  Fact Sheet for Healthcare Providers: IncredibleEmployment.be  This test is not yet approved or cleared by the [place] FDA and has been authorized for detection and/or diagnosis of SARS-CoV-2 by FDA under an Emergency Use Authorization (EUA). This EUA will remain in effect (meaning this test can be used) for the duration of the COVID-19 declaration under Section 564(b)(1) of the Act, 21  U.S.C. section 360bbb-3(b)(1), unless the authorization is terminated or revoked.     Resp Syncytial Virus by PCR NEGATIVE NEGATIVE Final    Comment: (NOTE) Fact Sheet for Patients: EntrepreneurPulse.com.au  Fact Sheet for Healthcare Providers: IncredibleEmployment.be  This test is not yet approved or cleared by the [place] FDA and has been authorized for detection and/or diagnosis of SARS-CoV-2 by FDA under an Emergency Use Authorization (EUA). This EUA will remain in effect (meaning this test can be used) for the duration of the COVID-19 declaration under Section 564(b)(1) of the Act, 21 U.S.C. section 360bbb-3(b)(1), unless the authorization is terminated or revoked.  Performed at [facility], [address], [place], [place] [address]   Blood  Culture (routine x 2)     Status: None (Preliminary result)   Collection Time: 09/07/22  2:29 PM   Specimen: BLOOD  Result Value Ref Range Status   Specimen Description BLOOD RIGHT ANTECUBITAL  Final   Special Requests   Final    BOTTLES DRAWN AEROBIC AND ANAEROBIC Blood Culture adequate volume   Culture   Final    NO GROWTH 3 DAYS Performed at [facility], [address] [place], [place] [address]    Report Status PENDING  Incomplete  C Difficile Quick Screen w PCR reflex     Status: None   Collection Time: 09/09/22  8:00 AM   Specimen: STOOL  Result Value Ref Range Status   C Diff antigen NEGATIVE NEGATIVE Final   C Diff toxin NEGATIVE NEGATIVE Final   C Diff interpretation No C. difficile detected.  Final    Comment: Performed at [facility], [address] [place], [place] [address]         Radiology Studies: ECHOCARDIOGRAM COMPLETE  Result Date: 09/10/2022    ECHOCARDIOGRAM REPORT   Patient Name:   VELMA BELSHAW Date of Exam: 09/09/2022 Medical Rec #:  [id number]      Height:       69.0 in Accession #:    [id number]     Weight:       174.2 lb  Date of Birth:  1955-08-25      BSA:          1.948 m Patient Age:    35 years       BP:           120/74 mmHg Patient Gender: M              HR:           97 bpm. Exam Location:  [facility] Procedure: 2D Echo, Cardiac Doppler and Color Doppler Indications:     R55 Syncope                  I26.09 Pulmonary Embolus  History:         Patient has no prior history of Echocardiogram examinations.                  Risk Factors:Hypertension and Dyslipidemia. Cancer.  Sonographer:     Cresenciano Lick RDCS Referring Phys:  [id number] [facility] Diagnosing Phys: Ida Rogue MD IMPRESSIONS  1. Left ventricular ejection fraction, by estimation, is 60 to 65%. The left ventricle has normal function. The left ventricle has no regional wall motion abnormalities. There is mild left ventricular hypertrophy. Left ventricular diastolic parameters are consistent with Grade I diastolic dysfunction (impaired relaxation).  2. Right ventricular systolic function is normal. The right ventricular size is normal. Tricuspid regurgitation signal is inadequate for assessing PA pressure.  3. The mitral valve is normal in structure. Mild mitral valve regurgitation. No evidence of mitral stenosis.  4. The aortic valve is normal in structure. Aortic valve regurgitation is not visualized. No aortic stenosis is present.  5. The inferior vena cava is normal in size with greater than 50% respiratory variability, suggesting right atrial pressure of 3 mmHg. FINDINGS  Left Ventricle: Left ventricular ejection fraction, by estimation, is 60 to 65%. The left ventricle has normal function. The left ventricle has no regional wall motion abnormalities. The left ventricular internal cavity size was normal in size. There is  mild left ventricular hypertrophy. Left ventricular diastolic parameters are consistent with  Grade I diastolic dysfunction (impaired relaxation). Right Ventricle: The right ventricular size is normal. No increase in right ventricular  wall thickness. Right ventricular systolic function is normal. Tricuspid regurgitation signal is inadequate for assessing PA pressure. Left Atrium: Left atrial size was normal in size. Right Atrium: Right atrial size was normal in size. Pericardium: There is no evidence of pericardial effusion. Mitral Valve: The mitral valve is normal in structure. Mild mitral valve regurgitation. No evidence of mitral valve stenosis. Tricuspid Valve: The tricuspid valve is normal in structure. Tricuspid valve regurgitation is mild . No evidence of tricuspid stenosis. Aortic Valve: The aortic valve is normal in structure. Aortic valve regurgitation is not visualized. No aortic stenosis is present. Pulmonic Valve: The pulmonic valve was normal in structure. Pulmonic valve regurgitation is not visualized. No evidence of pulmonic stenosis. Aorta: The aortic root is normal in size and structure. Venous: The inferior vena cava is normal in size with greater than 50% respiratory variability, suggesting right atrial pressure of 3 mmHg. IAS/Shunts: No atrial level shunt detected by color flow Doppler.  LEFT VENTRICLE PLAX 2D LVIDd:         4.60 cm   Diastology LVIDs:         2.90 cm   LV e' medial:    8.16 cm/s LV PW:         0.80 cm   LV E/e' medial:  9.2 LV IVS:        0.80 cm   LV e' lateral:   10.95 cm/s LVOT diam:     2.10 cm   LV E/e' lateral: 6.9 LV SV:         50 LV SV Index:   26 LVOT Area:     3.46 cm  RIGHT VENTRICLE          IVC RV Basal diam:  3.40 cm  IVC diam: 1.40 cm RV Mid diam:    3.90 cm LEFT ATRIUM             Index        RIGHT ATRIUM           Index LA diam:        3.70 cm 1.90 cm/m   RA Area:     10.10 cm LA Vol (A2C):   39.9 ml 20.48 ml/m  RA Volume:   23.00 ml  11.81 ml/m LA Vol (A4C):   27.1 ml 13.91 ml/m LA Biplane Vol: 35.8 ml 18.38 ml/m  AORTIC VALVE LVOT Vmax:   100.07 cm/s LVOT Vmean:  66.133 cm/s LVOT VTI:    0.145 m  AORTA Ao Root diam: 3.80 cm Ao Asc diam:  3.30 cm MITRAL VALVE MV Area (PHT): 4.96  cm    SHUNTS MV Decel Time: 153 msec    Systemic VTI:  0.15 m MV E velocity: 75.45 cm/s  Systemic Diam: 2.10 cm MV A velocity: 76.50 cm/s MV E/A ratio:  0.99 Ida Rogue MD Electronically signed by Ida Rogue MD Signature Date/Time: 09/10/2022/7:28:09 AM    Final    PERIPHERAL VASCULAR CATHETERIZATION  Result Date: 09/08/2022 See surgical note for result.       Scheduled Meds:  Chlorhexidine Gluconate Cloth  6 each Topical Daily   pantoprazole  40 mg Oral Daily   tamsulosin  0.4 mg Oral Daily   Continuous Infusions:  heparin 1,700 Units/hr (09/10/22 0515)     LOS: 2 days     Sidney Ace, MD [facility]  If 7PM-7AM, please contact night-coverage  09/10/2022, 11:47 AM

## 2022-09-10 NOTE — Progress Notes (Signed)
Progress Note    09/10/2022 3:16 PM 2 Days Post-Op  Subjective: Jeffrey Fernandez is now POD #1 from IVC gram and right lower extremity venogram with mechanical thrombectomy to right common femoral vein, right external and common iliac veins and the inferior vena cava up to the level of the renal veins. Also had PTA of the inferior vena cava.    On exam this afternoon the patient is resting comfortably in bed.  Right leg remains wrapped.  Patient states he feels much better today than yesterday.  Denies any resting pain.  Posttibial pulses still palpable.  Denies any chest pain or shortness of breath.  Complaints overnight.  Vitals all remained stable.   Vitals:   09/10/22 0742 09/10/22 1125  BP: 127/81 117/78  Pulse: 84 100  Resp: 18 16  Temp: 98.2 F (36.8 C) 98.2 F (36.8 C)  SpO2: 98% 98%   Physical Exam: Cardiac:  RRR no murmurs or gallops.  Lungs:  Lungs clear to auscultation normal respiratory effort.  Incisions:  Popliteal posterior right leg. Dressing clean dry and intact.  Extremities:  Right lower extremity wrapped with Coban. Swelling decreased from prior to procedure. Right posttibial pulse palpable.  Abdomen:  Positive bowel sounds, soft, nontender and nondistended.  Neurologic:  Alert and oriented x 3.  Answers all questions appropriately.   CBC    Component Value Date/Time   WBC 6.3 09/10/2022 0505   RBC 2.56 (L) 09/10/2022 0505   HGB 7.8 (L) 09/10/2022 0505   HGB 11.5 (L) 09/07/2022 1006   HGB 16.7 12/02/2021 1332   HCT 23.1 (L) 09/10/2022 0505   HCT 46.9 12/02/2021 1332   PLT 135 (L) 09/10/2022 0505   PLT 213 09/07/2022 1006   PLT 359 12/02/2021 1332   MCV 90.2 09/10/2022 0505   MCV 93 12/02/2021 1332   MCH 30.5 09/10/2022 0505   MCHC 33.8 09/10/2022 0505   RDW 11.8 09/10/2022 0505   RDW 12.5 12/02/2021 1332   LYMPHSABS 0.9 09/10/2022 0505   LYMPHSABS 2.2 06/10/2020 0855   MONOABS 0.6 09/10/2022 0505   EOSABS 0.1 09/10/2022 0505   EOSABS 0.2  06/10/2020 0855   BASOSABS 0.0 09/10/2022 0505   BASOSABS 0.1 06/10/2020 0855    BMET    Component Value Date/Time   NA 133 (L) 09/10/2022 0505   NA 142 12/02/2021 1332   K 3.5 09/10/2022 0505   CL 103 09/10/2022 0505   CO2 23 09/10/2022 0505   GLUCOSE 90 09/10/2022 0505   BUN 21 09/10/2022 0505   BUN 12 12/02/2021 1332   CREATININE 1.13 09/10/2022 0505   CREATININE 1.20 09/07/2022 1006   CALCIUM 8.2 (L) 09/10/2022 0505   GFRNONAA >60 09/10/2022 0505   GFRNONAA >60 09/07/2022 1006   GFRAA 90 06/10/2020 0855    INR    Component Value Date/Time   INR 1.3 (H) 09/07/2022 1428     Intake/Output Summary (Last 24 hours) at 09/10/2022 1516 Last data filed at 09/10/2022 1023 Gross per 24 hour  Intake 1643.73 ml  Output 250 ml  Net 1393.73 ml     Assessment/Plan:  68 y.o. male is s/p IVC gram and right lower extremity venogram with mechanical thrombectomy to right common femoral vein, right external and common iliac veins and the inferior vena cava up to the level of the renal veins.  2 Days Post-Op   PLAN:  Patient return to the vascular lab tomorrow on 09/11/2022 for inferior vena cava angioplasty with stent placement.  I discussed the procedure, benefits, risks, complications.  He verbalizes understanding.  I answered all his questions today.  He would like to proceed with the procedure tomorrow as planned.  Patient will be made n.p.o. after midnight.  Patient remains on a heparin infusion.  Plan was discussed with Dr. Hortencia Pilar MD and he is in agreement with the plan.  DVT prophylaxis:  Heparin Infusion   Drema Pry [facility] 09/10/2022 3:16 PM

## 2022-09-10 NOTE — H&P (View-Only) (Signed)
Progress Note    09/10/2022 3:16 PM 2 Days Post-Op  Subjective: Jeffrey Fernandez is now POD #1 from IVC gram and right lower extremity venogram with mechanical thrombectomy to right common femoral vein, right external and common iliac veins and the inferior vena cava up to the level of the renal veins. Also had PTA of the inferior vena cava.    On exam this afternoon the patient is resting comfortably in bed.  Right leg remains wrapped.  Patient states he feels much better today than yesterday.  Denies any resting pain.  Posttibial pulses still palpable.  Denies any chest pain or shortness of breath.  Complaints overnight.  Vitals all remained stable.   Vitals:   09/10/22 0742 09/10/22 1125  BP: 127/81 117/78  Pulse: 84 100  Resp: 18 16  Temp: 98.2 F (36.8 C) 98.2 F (36.8 C)  SpO2: 98% 98%   Physical Exam: Cardiac:  RRR no murmurs or gallops.  Lungs:  Lungs clear to auscultation normal respiratory effort.  Incisions:  Popliteal posterior right leg. Dressing clean dry and intact.  Extremities:  Right lower extremity wrapped with Coban. Swelling decreased from prior to procedure. Right posttibial pulse palpable.  Abdomen:  Positive bowel sounds, soft, nontender and nondistended.  Neurologic:  Alert and oriented x 3.  Answers all questions appropriately.   CBC    Component Value Date/Time   WBC 6.3 09/10/2022 0505   RBC 2.56 (L) 09/10/2022 0505   HGB 7.8 (L) 09/10/2022 0505   HGB 11.5 (L) 09/07/2022 1006   HGB 16.7 12/02/2021 1332   HCT 23.1 (L) 09/10/2022 0505   HCT 46.9 12/02/2021 1332   PLT 135 (L) 09/10/2022 0505   PLT 213 09/07/2022 1006   PLT 359 12/02/2021 1332   MCV 90.2 09/10/2022 0505   MCV 93 12/02/2021 1332   MCH 30.5 09/10/2022 0505   MCHC 33.8 09/10/2022 0505   RDW 11.8 09/10/2022 0505   RDW 12.5 12/02/2021 1332   LYMPHSABS 0.9 09/10/2022 0505   LYMPHSABS 2.2 06/10/2020 0855   MONOABS 0.6 09/10/2022 0505   EOSABS 0.1 09/10/2022 0505   EOSABS 0.2  06/10/2020 0855   BASOSABS 0.0 09/10/2022 0505   BASOSABS 0.1 06/10/2020 0855    BMET    Component Value Date/Time   NA 133 (L) 09/10/2022 0505   NA 142 12/02/2021 1332   K 3.5 09/10/2022 0505   CL 103 09/10/2022 0505   CO2 23 09/10/2022 0505   GLUCOSE 90 09/10/2022 0505   BUN 21 09/10/2022 0505   BUN 12 12/02/2021 1332   CREATININE 1.13 09/10/2022 0505   CREATININE 1.20 09/07/2022 1006   CALCIUM 8.2 (L) 09/10/2022 0505   GFRNONAA >60 09/10/2022 0505   GFRNONAA >60 09/07/2022 1006   GFRAA 90 06/10/2020 0855    INR    Component Value Date/Time   INR 1.3 (H) 09/07/2022 1428     Intake/Output Summary (Last 24 hours) at 09/10/2022 1516 Last data filed at 09/10/2022 1023 Gross per 24 hour  Intake 1643.73 ml  Output 250 ml  Net 1393.73 ml     Assessment/Plan:  67 y.o. male is s/p IVC gram and right lower extremity venogram with mechanical thrombectomy to right common femoral vein, right external and common iliac veins and the inferior vena cava up to the level of the renal veins.  2 Days Post-Op   PLAN:  Patient return to the vascular lab tomorrow on 09/11/2022 for inferior vena cava angioplasty with stent placement.  I discussed the procedure, benefits, risks, complications.  He verbalizes understanding.  I answered all his questions today.  He would like to proceed with the procedure tomorrow as planned.  Patient will be made n.p.o. after midnight.  Patient remains on a heparin infusion.  Plan was discussed with Dr. Hortencia Pilar MD and he is in agreement with the plan.  DVT prophylaxis:  Heparin Infusion   Drema Pry [facility] 09/10/2022 3:16 PM

## 2022-09-10 NOTE — Evaluation (Addendum)
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 Found to have extensive right lower extremity DVT extending into multiple veins and a right lower lobe PE. Now s/p mechanical thrombectomy.   Clinical Impression   Jeffrey Fernandez was seen for OT/PT co-evaluation this date. Prior to hospital admission, pt was MOD I using RW. Pt lives with family available 24/7. Pt presents to acute OT demonstrating impaired ADL performance and functional mobility 2/2 decreased activity tolerance and functional strength deficits. Pt currently requires SUPERVISION + RW sit<>stand and ~20 ft mobility, pt fatigues quickly citing 8/10 RPE. Educated on ECS and DME recs (family has all needed equipment), will sign off. Upon hospital discharge, pt may benefit from [facility] for advancement of HEP and ECS with IADLs.   Recommendations for follow up therapy are one component of a multi-disciplinary discharge planning process, led by the attending physician.  Recommendations may be updated based on patient status, additional functional criteria and insurance authorization.   Follow Up Recommendations  Home health OT     Assistance Recommended at Discharge Set up Supervision/Assistance  Patient can return home with the following A little help with walking and/or transfers;A little help with bathing/dressing/bathroom;Help with stairs or ramp for entrance    Functional Status Assessment  Patient has had a recent decline in their functional status and demonstrates the ability to  make significant improvements in function in a reasonable and predictable amount of time.  Equipment Recommendations  None recommended by OT    Recommendations for Other Services       Precautions / Restrictions Precautions Precautions: Fall Restrictions Weight Bearing Restrictions: No      Mobility Bed Mobility Overal bed mobility: Modified Independent                  Transfers Overall transfer level: Needs assistance Equipment used: Rolling walker (2 wheels) Transfers: Sit to/from Stand Sit to Stand: Supervision                  Balance Overall balance assessment: Needs assistance Sitting-balance support: No upper extremity supported, Feet supported Sitting balance-Leahy Scale: Normal     Standing balance support: During functional activity, Single extremity supported Standing balance-Leahy Scale: Good                             ADL either performed or assessed with clinical judgement   ADL Overall ADL's : Needs assistance/impaired                                       General ADL Comments: SUPERVISION + RW toilet t/f, pt fatigues quickly citing 8/10 RPE.MOD I for LBD at bed level      Pertinent Vitals/Pain Pain Assessment Pain Assessment: No/denies pain     Hand Dominance     Extremity/Trunk Assessment Upper Extremity Assessment Upper Extremity Assessment: Overall WFL for tasks assessed   Lower Extremity Assessment Lower Extremity Assessment: Overall WFL for tasks assessed  Communication Communication Communication: No difficulties   Cognition Arousal/Alertness: Awake/alert Behavior During Therapy: WFL for tasks assessed/performed Overall Cognitive Status: Within Functional Limits for tasks assessed                                                  Home Living Family/patient expects to be discharged to:: Private residence Living Arrangements: Spouse/significant  other Available Help at Discharge: Family;Available 24 hours/day Type of Home: House Home Access: Ramped entrance     Home Layout: Two level;Able to live on main level with bedroom/bathroom               Home Equipment: Rolling Walker (2 wheels);Rollator (4 wheels);BSC/3in1;Shower seat;Grab bars - toilet;Grab bars - tub/shower;Hand held shower head;Wheelchair - manual;Hospital bed          Prior Functioning/Environment Prior Level of Function : Independent/Modified Independent             Mobility Comments: RW and increased assistance in last ~week; fatigues quickly          OT Problem List: Decreased activity tolerance;Impaired balance (sitting and/or standing);Decreased safety awareness         OT Goals(Current goals can be found in the care plan section) Acute Rehab OT Goals Patient Stated Goal: to go home OT Goal Formulation: With patient Time For Goal Achievement: 09/24/22 Potential to Achieve Goals: Good   AM-PAC OT "6 Clicks" Daily Activity     Outcome Measure Help from another person eating meals?: None Help from another person taking care of personal grooming?: None Help from another person toileting, which includes using toliet, bedpan, or urinal?: A Little Help from another person bathing (including washing, rinsing, drying)?: A Little Help from another person to put on and taking off regular upper body clothing?: None Help from another person to put on and taking off regular lower body clothing?: None 6 Click Score: 22   End of Session Equipment Utilized During Treatment: Rolling walker (2 wheels)  Activity Tolerance: Patient tolerated treatment well Patient left: in bed;with call bell/phone within reach;with family/visitor present  OT Visit Diagnosis: Other abnormalities of gait and mobility (R26.89);Unsteadiness on feet (R26.81);Muscle weakness (generalized) (M62.81)                Time: [id number] OT Time Calculation (min): 20 min Charges:   OT General Charges $OT Visit: 1 Visit OT Evaluation $OT Eval Low Complexity: 1 Low  Dessie Coma, M.S. OTR/L  09/10/22, 10:29 AM  ascom [phone]

## 2022-09-10 NOTE — Consult Note (Signed)
ANTICOAGULATION CONSULT NOTE - Initial Consult  Pharmacy Consult for heparin infusion Indication: DVT  Allergies  Allergen Reactions   Amoxicillin Itching   Atenolol Diarrhea    Other reaction(s): HIVES   Other Nausea And Vomiting    Navy Beans   Oxycodone     Other reaction(s): HIVES   Shellfish Allergy Itching and Swelling    Patient Measurements: Height: '5\' 9"'$  (175.3 cm) Weight: 79 kg (174 lb 2.6 oz) IBW/kg (Calculated) : 70.7 Heparin Dosing Weight: 79 kg  Vital Signs: Temp: 98.2 F (36.8 C) (02/29 0253) BP: 125/78 (02/29 0253) Pulse Rate: 91 (02/29 0253)  Labs: Recent Labs    09/07/22 1348 09/07/22 1350 09/07/22 1428 09/07/22 1856 09/08/22 0019 09/08/22 0504 09/08/22 0701 09/09/22 0152 09/09/22 1018 09/09/22 1640 09/10/22 0505  HGB  --    < >  --   --   --  9.6*  --  8.4*  --   --  7.8*  HCT  --    < >  --   --   --  28.1*  --  24.4*  --   --  23.1*  PLT  --    < >  --   --   --  169  --  125*  --   --  135*  APTT  --   --  28  --   --   --   --   --   --   --   --   LABPROT  --   --  15.6*  --   --   --   --   --   --   --   --   INR  --   --  1.3*  --   --   --   --   --   --   --   --   HEPARINUNFRC  --   --   --   --    < >  --    < > 0.28* 0.63 0.57 0.53  CREATININE  --    < >  --   --   --  1.26*  --   --  1.10  --  1.13  TROPONINIHS 17  --   --  20*  --   --   --   --   --   --   --    < > = values in this interval not displayed.     Estimated Creatinine Clearance: 65.2 mL/min (by C-G formula based on SCr of 1.13 mg/dL).   Medical History: Past Medical History:  Diagnosis Date   Cancer [facility]) 2013   bladder   Diverticulitis    History of chickenpox    History of measles    History of mumps    Hyperlipidemia    Hypertension     Medications:  PTA: N/A Inpatient: heparin infusion (2/26 >>>) Allergies: No AC/APT related allergies  Assessment: 67 yearold male presents to ED with compliant of severe leg pain. US DVT Study on right  lower extremity showing Occlusive DVT in the right common femoral and profundus femoral veins as well as the right saphenofemoral junction. Pharmacy consulted for management of heparin infusion in the setting of DVT.   Date Time aPTT/HL Rate/Comment 2/27     0019      0.26              SUBtherapeutic @ 1400 u/hr 2/27 0701  0.34  Therapeutic x 1 2/28     0152      0.28              SUBtherapeutic X 1  2/28 1018    0.63  Therapeutic x 1 2/28 1640    0.57  Therapeutic x 2 2/29     0505       0.53             Therapeutic X 3   Goal of Therapy:  Heparin level 0.3-0.7 units/ml Monitor platelets by anticoagulation protocol: Yes   Plan: Heparin level therapeutic x 3 Continue heparin drip at 1700 units/hr Will transition to daily heparin levels. Next heparin level tomorrow (3/01) AM. Continue to monitor H&H and platelets daily while on heparin gtt.  Orene Desanctis, PharmD Clinical Pharmacist 09/10/2022 6:06 AM

## 2022-09-11 ENCOUNTER — Encounter
Admission: EM | Disposition: A | Discharge status: Home / Self Care | Payer: Self-pay | Source: Ambulatory Visit | Attending: Internal Medicine

## 2022-09-11 DIAGNOSIS — I82423 Acute embolism and thrombosis of iliac vein, bilateral: Secondary | ICD-10-CM

## 2022-09-11 DIAGNOSIS — R55 Syncope and collapse: Secondary | ICD-10-CM | POA: Diagnosis not present

## 2022-09-11 DIAGNOSIS — I82413 Acute embolism and thrombosis of femoral vein, bilateral: Secondary | ICD-10-CM

## 2022-09-11 DIAGNOSIS — I82220 Acute embolism and thrombosis of inferior vena cava: Secondary | ICD-10-CM | POA: Diagnosis not present

## 2022-09-11 DIAGNOSIS — I871 Compression of vein: Secondary | ICD-10-CM | POA: Diagnosis not present

## 2022-09-11 DIAGNOSIS — I82411 Acute embolism and thrombosis of right femoral vein: Secondary | ICD-10-CM | POA: Diagnosis not present

## 2022-09-11 HISTORY — PX: IVC VENOGRAPHY: CATH118301

## 2022-09-11 LAB — CBC
HCT: 22.1 % — ABNORMAL LOW (ref 39.0–52.0)
Hemoglobin: 7.6 g/dL — ABNORMAL LOW (ref 13.0–17.0)
MCH: 30.8 pg (ref 26.0–34.0)
MCHC: 34.4 g/dL (ref 30.0–36.0)
MCV: 89.5 fL (ref 80.0–100.0)
Platelets: 200 10*3/uL (ref 150–400)
RBC: 2.47 MIL/uL — ABNORMAL LOW (ref 4.22–5.81)
RDW: 11.9 % (ref 11.5–15.5)
WBC: 5.4 10*3/uL (ref 4.0–10.5)
nRBC: 0 % (ref 0.0–0.2)

## 2022-09-11 LAB — HEPARIN LEVEL (UNFRACTIONATED): Heparin Unfractionated: 0.47 IU/mL (ref 0.30–0.70)

## 2022-09-11 SURGERY — IVC VENOGRAPHY
Anesthesia: Moderate Sedation

## 2022-09-11 MED ORDER — FENTANYL CITRATE PF 50 MCG/ML IJ SOSY
PREFILLED_SYRINGE | INTRAMUSCULAR | Status: AC
  Filled 2022-09-11: qty 1

## 2022-09-11 MED ORDER — MIDAZOLAM HCL 2 MG/ML PO SYRP: 8.0000 mg | ORAL_SOLUTION | Freq: Once | ORAL | Status: DC | PRN

## 2022-09-11 MED ORDER — FAMOTIDINE 20 MG PO TABS: 40.0000 mg | ORAL_TABLET | Freq: Once | ORAL | Status: AC | PRN

## 2022-09-11 MED ORDER — CHLORHEXIDINE GLUCONATE CLOTH 2 % EX PADS
6.0000 | MEDICATED_PAD | Freq: Once | CUTANEOUS | Status: AC
  Administered 2022-09-11: 6 via TOPICAL

## 2022-09-11 MED ORDER — FENTANYL CITRATE (PF) 100 MCG/2ML IJ SOLN
INTRAMUSCULAR | Status: DC | PRN
  Administered 2022-09-11 (×3): 25 ug via INTRAVENOUS
  Administered 2022-09-11: 50 ug via INTRAVENOUS

## 2022-09-11 MED ORDER — IODIXANOL 320 MG/ML IV SOLN
INTRAVENOUS | Status: DC | PRN
  Administered 2022-09-11: 150 mL via INTRAVENOUS

## 2022-09-11 MED ORDER — METHYLPREDNISOLONE SODIUM SUCC 125 MG IJ SOLR: 125.0000 mg | Freq: Once | INTRAMUSCULAR | Status: AC | PRN

## 2022-09-11 MED ORDER — METHYLPREDNISOLONE SODIUM SUCC 125 MG IJ SOLR
INTRAMUSCULAR | Status: AC
  Administered 2022-09-11: 125 mg via INTRAVENOUS
  Filled 2022-09-11: qty 2

## 2022-09-11 MED ORDER — HYDROMORPHONE HCL 1 MG/ML IJ SOLN: 1.0000 mg | Freq: Once | INTRAMUSCULAR | Status: DC | PRN

## 2022-09-11 MED ORDER — SODIUM CHLORIDE 0.9 % IV SOLN: INTRAVENOUS | Status: DC

## 2022-09-11 MED ORDER — VANCOMYCIN HCL IN DEXTROSE 1-5 GM/200ML-% IV SOLN
INTRAVENOUS | Status: AC
  Administered 2022-09-11: 1000 mg via INTRAVENOUS
  Filled 2022-09-11: qty 200

## 2022-09-11 MED ORDER — FENTANYL CITRATE (PF) 100 MCG/2ML IJ SOLN
INTRAMUSCULAR | Status: AC
  Filled 2022-09-11: qty 2

## 2022-09-11 MED ORDER — FAMOTIDINE 20 MG PO TABS
ORAL_TABLET | ORAL | Status: AC
  Administered 2022-09-11: 40 mg via ORAL
  Filled 2022-09-11: qty 2

## 2022-09-11 MED ORDER — VANCOMYCIN HCL IN DEXTROSE 1-5 GM/200ML-% IV SOLN: 1000.0000 mg | INTRAVENOUS | Status: AC

## 2022-09-11 MED ORDER — DIPHENHYDRAMINE HCL 50 MG/ML IJ SOLN
INTRAMUSCULAR | Status: AC
  Administered 2022-09-11: 50 mg via INTRAVENOUS
  Filled 2022-09-11: qty 1

## 2022-09-11 MED ORDER — FENTANYL CITRATE PF 50 MCG/ML IJ SOSY: 12.5000 ug | PREFILLED_SYRINGE | Freq: Once | INTRAMUSCULAR | Status: DC | PRN

## 2022-09-11 MED ORDER — HEPARIN SODIUM (PORCINE) 1000 UNIT/ML IJ SOLN
INTRAMUSCULAR | Status: AC
  Filled 2022-09-11: qty 10

## 2022-09-11 MED ORDER — DIPHENHYDRAMINE HCL 50 MG/ML IJ SOLN: 50.0000 mg | Freq: Once | INTRAMUSCULAR | Status: AC | PRN

## 2022-09-11 MED ORDER — ONDANSETRON HCL 4 MG/2ML IJ SOLN: 4.0000 mg | Freq: Four times a day (QID) | INTRAMUSCULAR | Status: DC | PRN

## 2022-09-11 MED ORDER — MIDAZOLAM HCL 2 MG/2ML IJ SOLN
INTRAMUSCULAR | Status: AC
  Filled 2022-09-11: qty 4

## 2022-09-11 MED ORDER — MIDAZOLAM HCL 2 MG/2ML IJ SOLN
INTRAMUSCULAR | Status: DC | PRN
  Administered 2022-09-11 (×2): .5 mg via INTRAVENOUS
  Administered 2022-09-11: 2 mg via INTRAVENOUS
  Administered 2022-09-11: .5 mg via INTRAVENOUS

## 2022-09-11 MED ORDER — HEPARIN SODIUM (PORCINE) 1000 UNIT/ML IJ SOLN
INTRAMUSCULAR | Status: DC | PRN
  Administered 2022-09-11: 6000 [IU] via INTRAVENOUS

## 2022-09-11 MED ORDER — HEPARIN (PORCINE) 25000 UT/250ML-% IV SOLN
1700.0000 [IU]/h | INTRAVENOUS | Status: AC
  Administered 2022-09-12: 1700 [IU]/h via INTRAVENOUS
  Filled 2022-09-11: qty 250

## 2022-09-11 SURGICAL SUPPLY — 31 items
BALLN ATG 12X4X80 (BALLOONS) ×1
BALLN ATG 12X6X80 (BALLOONS) ×1
BALLN ATG 14X6X80 (BALLOONS) ×1
BALLN LUTONIX AV 12X40X75 (BALLOONS) ×1
BALLOON ATG 12X4X80 (BALLOONS) IMPLANT
BALLOON ATG 12X6X80 (BALLOONS) IMPLANT
BALLOON ATG 14X6X80 (BALLOONS) IMPLANT
BALLOON LUTONIX AV 12X40X75 (BALLOONS) IMPLANT
CANISTER PENUMBRA ENGINE (MISCELLANEOUS) IMPLANT
CATH ANGIO 5F SIM1 100CM (CATHETERS) IMPLANT
CATH BEACON 5 .035 65 KMP TIP (CATHETERS) IMPLANT
CATH INDIGO 12XTORQ 100 (CATHETERS) IMPLANT
CATH INDIGO SEP 12 (CATHETERS) IMPLANT
CATH INDIGO SEP 8 (CATHETERS) IMPLANT
CATH LIGHTNING 8 XTORQ 115 (CATHETERS) IMPLANT
CLOSURE PERCLOSE PROSTYLE (VASCULAR PRODUCTS) IMPLANT
GLIDEWIRE ADV .035X180CM (WIRE) IMPLANT
KIT ENCORE 40 (KITS) IMPLANT
NDL ENTRY 21GA 7CM ECHOTIP (NEEDLE) IMPLANT
NEEDLE ENTRY 21GA 7CM ECHOTIP (NEEDLE) ×1 IMPLANT
PACK ANGIOGRAPHY (CUSTOM PROCEDURE TRAY) ×1 IMPLANT
SET INTRO CAPELLA COAXIAL (SET/KITS/TRAYS/PACK) IMPLANT
SHEATH 9FRX11 (SHEATH) IMPLANT
SHEATH BRITE TIP 5FRX11 (SHEATH) IMPLANT
SHEATH BRITE TIP 6FRX11 (SHEATH) IMPLANT
SHEATH FAST CATH 12F 12CM (SHEATH) IMPLANT
STENT VENOVO 14X60X80 (Permanent Stent) IMPLANT
STENT VENOVO 14X80X80 (Permanent Stent) IMPLANT
STENT VENOVO 20X40X80 (Permanent Stent) IMPLANT
STENT VENOVO 20X60X80 (Permanent Stent) IMPLANT
WIRE GUIDERIGHT .035X150 (WIRE) IMPLANT

## 2022-09-11 NOTE — Interval H&P Note (Signed)
History and Physical Interval Note:  09/11/2022 4:21 PM  Jeffrey Fernandez  has presented today for surgery, with the diagnosis of IVC Stenosis.  The various methods of treatment have been discussed with the patient and family. After consideration of risks, benefits and other options for treatment, the patient has consented to  Procedure(s): IVC Venography (N/A) as a surgical intervention.  The patient's history has been reviewed, patient examined, no change in status, stable for surgery.  I have reviewed the patient's chart and labs.  Questions were answered to the patient's satisfaction.     Hortencia Pilar

## 2022-09-11 NOTE — Consult Note (Signed)
ANTICOAGULATION CONSULT NOTE - Initial Consult  Pharmacy Consult for heparin infusion Indication: DVT  Allergies  Allergen Reactions   Amoxicillin Itching   Atenolol Diarrhea    Other reaction(s): HIVES   Other Nausea And Vomiting    Navy Beans   Oxycodone     Other reaction(s): HIVES   Shellfish Allergy Itching and Swelling    Patient Measurements: Height: '5\' 9"'$  (175.3 cm) Weight: 79 kg (174 lb 2.6 oz) IBW/kg (Calculated) : 70.7 Heparin Dosing Weight: 79 kg  Vital Signs: Temp: 98.6 F (37 C) (02/29 2352) Temp Source: Oral (02/29 2352) BP: 120/77 (02/29 2352) Pulse Rate: 95 (02/29 2352)  Labs: Recent Labs    09/09/22 0152 09/09/22 1018 09/09/22 1640 09/10/22 0505 09/11/22 0405  HGB 8.4*  --   --  7.8*  --   HCT 24.4*  --   --  23.1*  --   PLT 125*  --   --  135*  --   HEPARINUNFRC 0.28* 0.63 0.57 0.53 0.47  CREATININE  --  1.10  --  1.13  --      Estimated Creatinine Clearance: 65.2 mL/min (by C-G formula based on SCr of 1.13 mg/dL).   Medical History: Past Medical History:  Diagnosis Date   Cancer [facility]) 2013   bladder   Diverticulitis    History of chickenpox    History of measles    History of mumps    Hyperlipidemia    Hypertension     Medications:  PTA: N/A Inpatient: heparin infusion (2/26 >>>) Allergies: No AC/APT related allergies  Assessment: 68 yearold male presents to ED with compliant of severe leg pain. US DVT Study on right lower extremity showing Occlusive DVT in the right common femoral and profundus femoral veins as well as the right saphenofemoral junction. Pharmacy consulted for management of heparin infusion in the setting of DVT.   Date Time aPTT/HL Rate/Comment 2/27     0019      0.26              SUBtherapeutic @ 1400 u/hr 2/27 0701    0.34  Therapeutic x 1 2/28     0152      0.28              SUBtherapeutic X 1  2/28 1018    0.63  Therapeutic x 1 2/28 1640    0.57  Therapeutic x 2 2/29     0505       0.53              Therapeutic X 3  3/01     0405       0.47             Therapeutic X 4   Goal of Therapy:  Heparin level 0.3-0.7 units/ml Monitor platelets by anticoagulation protocol: Yes   Plan: Heparin level therapeutic x 4 Continue heparin drip at 1700 units/hr Will transition to daily heparin levels. Next heparin level tomorrow (3/02) AM. Continue to monitor H&H and platelets daily while on heparin gtt.  Orene Desanctis, PharmD Clinical Pharmacist 09/11/2022 5:41 AM

## 2022-09-11 NOTE — Op Note (Signed)
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   US guidance for vascular access to right and left superficial femoral veins proximally 2.   Catheter placement into the IVC from both right and left femoral venous approach  3.   IVC gram and bilateral iliac and common femoral venograms 4.   Mechanical thrombectomy to using the penumbra CAT right common femoral, external iliac, common iliac veins and the left common iliac vein as well as the inferior vena cava 5.   Percutaneous transluminal angioplasty and stent placement of the inferior vena cava using a 20 mm x 60 mm Venovo and a 20 mm x 40 mm Venovo 6.   Percutaneous transluminal angioplasty and stent placement of the common iliac veins using a 14 mm x 60 mm Venovo  stent on the right and a 14 mm x 80 mm Venovo stent on the left    SURGEON: Hortencia Pilar  ASSISTANT(S): none  ANESTHESIA: Continuous ECG pulse oximetry and cardiopulmonary monitoring was performed throughout the entire procedure by the interventional radiology nurse.  Parenteral Versed and fentanyl were administered.  Total sedation time was 95 minutes.  ESTIMATED BLOOD LOSS: 300 cc  CONTRAST: 150 cc.  FLUOROSCOPY TIME: 14.9 min.  FINDING(S): 1.  Thrombus within the common femoral veins bilaterally external and common iliac veins bilaterally and the entire inferior vena cava associated with the previously placed IVC filter.  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  SPECIMEN(S): Thrombus which is photographed and placed in the media section of the chart  INDICATIONS:    Patient is a 67 y.o. y.o. male who presents with increasing pain and swelling of both lower extremities.  Patient has marked leg swelling and pain.  CT  venography demonstrates thrombosis of the common femoral veins proximally along with the external and common iliac veins bilaterally and the inferior vena cava.  Venous intervention is performed to reduce the symtpoms and avoid long term postphlebitic symptoms.    DESCRIPTION: After obtaining full informed written consent, the patient was brought back to the vascular suite and placed supine upon the table. Moderate conscious sedation was administered during a face to face encounter with the patient throughout the procedure with my supervision of the RN administering medicines and monitoring the patient's vital signs, pulse oximetry, telemetry and mental status throughout from the start of the procedure until the patient was taken to the recovery room.  After obtaining adequate anesthesia, the patient was prepped and draped in the standard fashion.     The patient is placed in the supine position and both groins were prepped and draped in a sterile fashion.  The right superficial femoral vein is imaged with duplex ultrasound identifying the confluence of the profunda femoris vein and the superficial femoral vein and access is obtained just distal to the confluence of these 2 veins.  A microneedle was then utilized and the vein was accessed under direct ultrasound guidance without difficulty, a permanent image was recorded.  A 6 French sheath was then inserted and a Perclose device was deployed in a preclosed fashion.  I I then upsized to an 12 Fr sheath over a J wire.  4000 units of heparin were then given.  Imaging showed extensive DVT with minimal flow.  A Kumpe catheter and stiff angled glide wire were then advanced into the IVC and images were performed.    Hand-injection through both the sheath as well as the Kumpe catheter demonstrated thrombus within the proximal common femoral the entire external and entire common iliac veins on the right.  Inferior vena cava is also occluded.    I addressed the left  iliac system.  Again using the ultrasound the left common femoral vein was imaged.  I scanned distally to identify the confluence of the profunda femoris and superficial femoral veins.  1% lidocaine was infiltrated into the soft tissue.  An image was recorded for the permanent record.  Using a micropuncture needle I accessed the superficial femoral vein just distal to the confluence with the profunda femoris.  Microwire was then advanced without difficulty followed by a microsheath J-wire followed by a 5 [place] sheath.  Wire was introduced and a Perclose device was deployed in a preclose fashion and then the sheath was upsized to a 9 [place] sheath.  I then negotiated the Glidewire and Kumpe catheter through the left iliac system and again up through the inferior vena cava.    Beginning on the right side I used the Penumbra Cat 12 catheter and evacuated about 200 of effluent with mechanical thrombectomy throughout the proximal common femoral, common and external iliac veins and the IVC.  Follow-up imaging through the penumbra catheter and the sheath demonstrated significant reduction in the thrombus within the common femoral and iliac veins as well as the inferior vena cava.  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  I then negotiated the wire through the stenosis and using a Simmons 1 catheter selected the right renal vein.  Hand-injection of contrast then allowed to completely visualize the right renal vein which was the lowest as well as the cava just above the stenosis.  At this point I selected a 20 mm x 60 mm  Venovo stent and deployed this.  I then imaged the cava and required a extension with a 20 mm x 40 mm  Venovo stent.  Follow-up imaging demonstrated the stents were in good position and I proceeded to angioplasty the stents with a 12 mm x 40 mm Lutonix drug-eluting  balloon inflated to 12 atm for approximately 1 minute.  I then used a 14 mm x 60 mm Atlas balloon inflated to 6 atm for approximately 1 minute.  Follow-up imaging now demonstrated the inferior vena cava lesion is now well treated with less than 20% residual stenosis.  I turned my attention to the left iliac system.    Through the left femoral sheath imaging demonstrated extensive collaterals from the gonadal complex with occlusion of the common iliac.  The penumbra CAT 8 was then advanced through the 9 French sheath and thrombectomy of the left common iliac was performed.  Approximately 4-6 passes were made both with and without a separator the initial wound was also made over wire.  Follow-up imaging now demonstrated that the majority of thrombus had been removed however high-grade more than 70% stenosis of the common iliac near the confluence with the right side was identified.  At this point I felt the only way to fully reconstruct the situation was to extend stents from the IVC down into the common iliac veins bilaterally using the kissing balloon technique.  A 14 mm x  60 mm Venovo stent was deployed on the right simultaneously with a 14 mm x 80 mm Venovo stent on the left.  They were postdilated with 12 mm balloons.  These were Atlas balloons inflated to 6 to 8 atm for approximately 1 minute.  Follow-up imaging demonstrated some residual stenosis at the leading edge on the left and the Atlas balloon was reintroduced and a balloon angioplasty was performed.  Follow-up imaging now from both sheaths demonstrated essentially the resolution of the thrombus throughout the common femoral and iliac veins as well as the cava.  There is now marked improvement with rapid flow of contrast.  At this point I elected to terminate the case.  The sheath on the right was removed and the Perclose secured hemostasis was achieved.  The sheath on the left was removed and the Perclose device secured hemostasis was achieved a  dressing was placed.  She was taken to the recovery room in stable condition having tolerated the procedure well.    COMPLICATIONS: None  CONDITION: Stable  Hortencia Pilar 09/11/2022, 6:31 PM 1:34 PM

## 2022-09-11 NOTE — Consult Note (Addendum)
ANTICOAGULATION CONSULT NOTE - Initial Consult  Pharmacy Consult for heparin infusion Indication: DVT  Allergies  Allergen Reactions   Amoxicillin Itching   Atenolol Diarrhea    Other reaction(s): HIVES   Other Nausea And Vomiting    Navy Beans   Oxycodone     Other reaction(s): HIVES   Shellfish Allergy Itching and Swelling    Patient Measurements: Height: '5\' 9"'$  (175.3 cm) Weight: 79 kg (174 lb 2.6 oz) IBW/kg (Calculated) : 70.7 Heparin Dosing Weight: 79 kg  Vital Signs: Temp: 98.5 F (36.9 C) (03/01 1600) Temp Source: Oral (03/01 1600) BP: 135/83 (03/01 1845) Pulse Rate: 84 (03/01 1845)  Labs: Recent Labs    09/09/22 0152 09/09/22 1018 09/09/22 1640 09/10/22 0505 09/11/22 0405 09/11/22 0831  HGB 8.4*  --   --  7.8*  --  7.6*  HCT 24.4*  --   --  23.1*  --  22.1*  PLT 125*  --   --  135*  --  200  HEPARINUNFRC 0.28* 0.63 0.57 0.53 0.47  --   CREATININE  --  1.10  --  1.13  --   --      Estimated Creatinine Clearance: 65.2 mL/min (by C-G formula based on SCr of 1.13 mg/dL).   Medical History: Past Medical History:  Diagnosis Date   Cancer [facility]) 2013   bladder   Diverticulitis    History of chickenpox    History of measles    History of mumps    Hyperlipidemia    Hypertension     Medications:  PTA: N/A Inpatient: heparin infusion (2/26 >>>) Allergies: No AC/APT related allergies  Assessment: 67 yearold male presents to ED with compliant of severe leg pain. US DVT Study on right lower extremity showing Occlusive DVT in the right common femoral and profundus femoral veins as well as the right saphenofemoral junction. Pharmacy consulted for management of heparin infusion in the setting of DVT s/p mechanical thrombectomy on 3/1.   Date Time aPTT/HL Rate/Comment 2/27     0019      0.26              SUBtherapeutic @ 1400 u/hr 2/27 0701    0.34  Therapeutic x 1 2/28     0152      0.28              SUBtherapeutic X 1  2/28 1018    0.63  Therapeutic x  1 2/28 1640    0.57  Therapeutic x 2 2/29     0505       0.53             Therapeutic X 3  3/01     0405       0.47             Therapeutic X 4 3/01 1627   Heparin drip stopped 3/01 1904   Heparin drip restarted  Goal of Therapy:  Heparin level 0.3-0.7 units/ml Monitor platelets by anticoagulation protocol: Yes   Plan: Restart heparin drip at previous rate of 1700 units/hr. No bolus Check anti-Xa level in 6 hours and daily once consecutively therapeutic. Continue to monitor H&H and platelets daily while on heparin gtt.   Will M. Ouida Sills, PharmD PGY-1 Pharmacy Resident 09/11/2022 7:18 PM

## 2022-09-11 NOTE — Progress Notes (Signed)
Progress Note   Patient: Jeffrey Fernandez [id number] DOB: 08-22-1955 DOA: 09/07/2022     3 DOS: the patient was seen and examined on 09/11/2022   Brief hospital course:  67 y.o. 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  Patient endorses poor p.o. intake since chemotherapy last week.  Progressive fatigue.   Patient noted his right lower extremity to be edematous and painful with decreased range of motion.  Sent from cancer center to emergency room.  In the ED found to have extensive lower extremity DVT extending into multiple veins.   Assessment & Plan:   Principal Problem:   DVT (deep venous thrombosis) (HCC) Active Problems:   Essential (primary) hypertension   Personal history of bladder cancer   Gastroesophageal reflux disease   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[place])   Palliative care encounter   DVT Right lower lobe PE Patient presented with asymmetric right lower extremity swelling.   Ultrasound confirmed DVT on presentation.   CT angio ordered after patient was admitted confirmed right lower lobe PE  Plan: Continue heparin GTT Currently awaiting inferior vena cava angioplasty with stent placemen  As needed pain control  Near syncope Hypotension Likely in the setting of poor p.o. intake/anorexia IVF stopped Fall precautions   Stage IV urothelial carcinoma Seen outpatient by Dr. Grayland Ormond and Altha Harm nurse practitioner palliative care Notified Altha Harm of patient's admission CT angio thorax demonstrates progression of underlying metastatic disease CT abdomen with similar results as per metastatic disease Appreciate oncology consultation   Essential hypertension BP meds on hold   BPH Continue Flomax   Intractable nausea and vomiting As needed  Zofran Daily Protonix       DVT prophylaxis: Heparin GTT  Code Status: Full code  Family Communication: Family member at bedside and case discussed  Disposition Plan: Status is: Inpatient  Remains inpatient appropriate because: DVT/PE on heparin drip      Level of care: Med-Surg   Consultants:  Vascular surgery        Subjective:  Patient seen and examined at bedside this morning Admits to significant improvement in his respiratory function Currently awaiting inferior vena cava angioplasty with stent placemen   Physical Exam: Vitals:   09/10/22 2352 09/11/22 0621 09/11/22 0815 09/11/22 1117  BP: 120/77 133/75 134/77 123/77  Pulse: 95 90 89 87  Resp: '19 19 18 18  '$ Temp: 98.6 F (37 C) 98.9 F (37.2 C) 99 F (37.2 C) 98.1 F (36.7 C)  TempSrc: Oral Oral  Oral  SpO2: 98%  97% 98%  Weight:      Height:       General exam: Appears calm and comfortable  Respiratory system: Clear to auscultation. Respiratory effort normal. Cardiovascular system: S1-S2, RRR, no murmurs, no pedal edema Gastrointestinal system: Soft, NT/ND, normal bowel sounds Central nervous system: Alert and oriented. No focal neurological deficits. Extremities: Right lower extremity surgical dressings.  Not removed.  Swelling improved Skin: No rashes, lesions or ulcers Psychiatry: Judgement and insight appear normal. Mood & affect appropriate.   Data Reviewed:  Laboratory results reviewed by me  Family Communication: Plan of care discussed with wife at bedside  Disposition: Status is: Inpatient   Planned Discharge Destination: Home when medically stable    Time spent: 32 minutes  Author: Verline Lema, MD 09/11/2022 3:50 PM  For  on call review [email or web address].

## 2022-09-12 DIAGNOSIS — R55 Syncope and collapse: Secondary | ICD-10-CM

## 2022-09-12 DIAGNOSIS — I82411 Acute embolism and thrombosis of right femoral vein: Secondary | ICD-10-CM | POA: Diagnosis not present

## 2022-09-12 LAB — CBC
HCT: 20.8 % — ABNORMAL LOW (ref 39.0–52.0)
Hemoglobin: 7.2 g/dL — ABNORMAL LOW (ref 13.0–17.0)
MCH: 30.8 pg (ref 26.0–34.0)
MCHC: 34.6 g/dL (ref 30.0–36.0)
MCV: 88.9 fL (ref 80.0–100.0)
Platelets: 238 10*3/uL (ref 150–400)
RBC: 2.34 MIL/uL — ABNORMAL LOW (ref 4.22–5.81)
RDW: 12.1 % (ref 11.5–15.5)
WBC: 5.8 10*3/uL (ref 4.0–10.5)
nRBC: 0 % (ref 0.0–0.2)

## 2022-09-12 LAB — CULTURE, BLOOD (ROUTINE X 2)
Culture: NO GROWTH
Culture: NO GROWTH
Special Requests: ADEQUATE
Special Requests: ADEQUATE

## 2022-09-12 LAB — HEPARIN LEVEL (UNFRACTIONATED): Heparin Unfractionated: 0.43 IU/mL (ref 0.30–0.70)

## 2022-09-12 MED ORDER — APIXABAN 5 MG PO TABS: 10.0000 mg | ORAL_TABLET | Freq: Two times a day (BID) | ORAL | 0 refills | Status: DC

## 2022-09-12 MED ORDER — HEPARIN SOD (PORK) LOCK FLUSH 100 UNIT/ML IV SOLN
500.0000 [IU] | Freq: Once | INTRAVENOUS | Status: AC
  Administered 2022-09-12: 500 [IU] via INTRAVENOUS
  Filled 2022-09-12: qty 5

## 2022-09-12 MED ORDER — APIXABAN 5 MG PO TABS: 5.0000 mg | ORAL_TABLET | Freq: Two times a day (BID) | ORAL | Status: DC

## 2022-09-12 MED ORDER — APIXABAN 5 MG PO TABS: 5.0000 mg | ORAL_TABLET | Freq: Two times a day (BID) | ORAL | 0 refills | Status: DC

## 2022-09-12 MED ORDER — TRAZODONE HCL 50 MG PO TABS: 25.0000 mg | ORAL_TABLET | Freq: Every evening | ORAL | 0 refills | Status: DC | PRN

## 2022-09-12 MED ORDER — APIXABAN 5 MG PO TABS
10.0000 mg | ORAL_TABLET | Freq: Two times a day (BID) | ORAL | Status: DC
  Administered 2022-09-12: 10 mg via ORAL
  Filled 2022-09-12: qty 2

## 2022-09-12 NOTE — Progress Notes (Signed)
Mobility Specialist - Progress Note     09/12/22 1000  Mobility  Activity Ambulated independently in room  Level of Assistance Modified independent, requires aide device or extra time  Assistive Device Front wheel walker  Distance Ambulated (ft) 70 ft  Activity Response Tolerated well  Mobility Referral Yes  $Mobility charge 1 Mobility   Pt resting in bed on RA upon entry. Pt STS and ambulates around room (preferred to stay in room due to being cold) ModI with AD. Pt returned to bed and left with needs in reach and wife present in room.   Loma Sender Mobility Specialist 09/12/22, 10:55 AM

## 2022-09-12 NOTE — TOC Transition Note (Signed)
Transition of Care [facility]) - CM/SW Discharge Note   Patient Details  Name: Jeffrey Fernandez MRN: [id number] Date of Birth: 1955/10/13  Transition of Care [facility]) CM/SW Contact:  Izola Price, RN Phone Number: 09/12/2022, 2:19 PM   Clinical Narrative:  3/2: Potential discharge today. Declined recommended HH but had needed DME at home per prior CM notes. Eliquis coupon for DVT printed to unit per provider request. Unit RN/Student RN to give to patient.  Simmie Davies RN CM           Patient Goals and CMS Choice      Discharge Placement                         Discharge Plan and Services Additional resources added to the After Visit Summary for                                       Social Determinants of Health (SDOH) Interventions SDOH Screenings   Food Insecurity: No Food Insecurity (09/07/2022)  Housing: Low Risk  (09/07/2022)  Transportation Needs: No Transportation Needs (09/07/2022)  Utilities: Not At Risk (09/07/2022)  Alcohol Screen: Low Risk  (12/02/2021)  Depression (PHQ2-9): Low Risk  (12/02/2021)  Tobacco Use: Medium Risk (09/09/2022)     Readmission Risk Interventions     No data to display

## 2022-09-12 NOTE — Discharge Summary (Signed)
Physician Discharge Summary   Patient: Jeffrey Fernandez MRN: [id number] DOB: 18-Mar-1956  Admit date:     09/07/2022  Discharge date: 09/12/22  Discharge Physician: Verline Lema   PCP: Birdie Sons, MD   Recommendations at discharge:  Follow-up with vascular surgeon in 2 weeks  Discharge Diagnoses:  DVT Right lower lobe PE Near syncope Hypotension-resolved Stage IV urothelial carcinoma Essential hypertension BPH Intractable nausea and vomiting-resolved  Hospital Course:  Jeffrey Fernandez is a 67 y.o. 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   Patient noted his right lower extremity to be edematous and painful with decreased range of motion.  Sent from cancer center to emergency room.  In the ED found to have extensive lower extremity DVT extending into multiple veins. On 09/11/2022 patient underwent below mentioned procedure;  1.   US guidance for vascular access to right and left superficial femoral veins proximally 2.   Catheter placement into the IVC from both right and left femoral venous approach  3.   IVC gram and bilateral iliac and common femoral venograms 4.   Mechanical thrombectomy to using the penumbra CAT right common femoral, external iliac, common iliac veins and the left common iliac vein as well as the inferior vena cava 5.   Percutaneous transluminal angioplasty and stent placement of the inferior vena cava using a 20 mm x 60 mm Venovo and a 20 mm x 40 mm Venovo 6.   Percutaneous transluminal angioplasty and stent placement of the common iliac veins using a 14 mm x 60 mm Venovo  stent on the right and a 14 mm x 80 mm Venovo stent on the left  Patient was cleared for discharge by vascular surgery to continue Eliquis and to follow-up with oncologist as well as  vascular surgeon.   Consultants: Vascular surgeon Procedures performed: As mentioned above Disposition: Home Diet recommendation:  Discharge Diet Orders (From admission, onward)     Start     Ordered   09/12/22 0000  Diet - low sodium heart healthy        09/12/22 1619           Cardiac diet DISCHARGE MEDICATION: Allergies as of 09/12/2022       Reactions   Amoxicillin Itching   Atenolol Diarrhea   Other reaction(s): HIVES   Other Nausea And Vomiting   Navy Beans   Oxycodone    Other reaction(s): HIVES   Shellfish Allergy Itching, Swelling        Medication List     STOP taking these medications    [employer] PROSTATE HEALTH FORMULA PO   multivitamin-lutein Caps capsule   OCUVITE ADULT FORMULA PO       TAKE these medications    acetaminophen 500 MG tablet Commonly known as: TYLENOL Take 500 mg by mouth every 6 (six) hours as needed.   apixaban 5 MG Tabs tablet Commonly known as: ELIQUIS Take 2 tablets (10 mg total) by mouth 2 (two) times daily.   apixaban 5 MG Tabs tablet Commonly known as: ELIQUIS Take 1 tablet (5 mg total) by mouth 2 (two) times daily. Start taking on: September 19, 2022   aspirin 81 MG tablet Take 81 mg by mouth daily.   chlorproMAZINE 10 MG tablet Commonly known as: THORAZINE Take 1 tablet (10 mg total) by mouth  3 (three) times daily as needed for hiccoughs or nausea. Do not take when taking Zofran/ondansetron.   docusate sodium 250 MG capsule Commonly known as: COLACE Take 250 mg by mouth daily.   fentaNYL 25 MCG/HR Commonly known as: [place] 1 patch onto the skin every 3 (three) days.   HYDROcodone-acetaminophen 5-325 MG tablet Commonly known as: Norco Take 1 tablet by mouth every 6 (six) hours as needed for moderate pain.   lidocaine-prilocaine cream Commonly known as: EMLA Apply to affected area once   losartan-hydrochlorothiazide 100-12.5 MG tablet Commonly known as: HYZAAR TAKE 1 TABLET BY MOUTH EVERY DAY    metoprolol succinate 25 MG 24 hr tablet Commonly known as: TOPROL-XL TAKE 1 TABLET (25 MG TOTAL) BY MOUTH DAILY.   ondansetron 8 MG disintegrating tablet Commonly known as: ZOFRAN-ODT Take 8 mg by mouth every 8 (eight) hours as needed.   ondansetron 8 MG tablet Commonly known as: ZOFRAN Take 1 tablet (8 mg total) by mouth every 8 (eight) hours as needed for nausea or vomiting.   pantoprazole 40 MG tablet Commonly known as: PROTONIX TAKE 1 TABLET BY MOUTH EVERY DAY   prochlorperazine 10 MG tablet Commonly known as: COMPAZINE Take 1 tablet (10 mg total) by mouth every 6 (six) hours as needed (Nausea or vomiting).   sildenafil 20 MG tablet Commonly known as: Revatio Take 1-3 tablets (20-60 mg total) by mouth daily as needed.   tamsulosin 0.4 MG Caps capsule Commonly known as: FLOMAX Take 1 capsule (0.4 mg total) by mouth daily.   traZODone 50 MG tablet Commonly known as: DESYREL Take 0.5 tablets (25 mg total) by mouth at bedtime as needed for sleep.        Follow-up Information     Schnier, Dolores Lory, MD. Schedule an appointment as soon as possible for a visit in 2 week(s).   Specialties: Vascular Surgery, Cardiology, Radiology, Vascular Surgery Why: Call to make appointment with vascular surgeon and follow-up with him Contact information: [place] [address] [phone]                Discharge Exam: Filed Weights   09/07/22 1344 09/08/22 1540 09/11/22 1600  Weight: 79 kg 79 kg 79 kg   General exam: Appears calm and comfortable  Respiratory system: Clear to auscultation. Respiratory effort normal. Cardiovascular system: S1-S2, RRR, no murmurs, no pedal edema Gastrointestinal system: Soft, NT/ND, normal bowel sounds Central nervous system: Alert and oriented. No focal neurological deficits. Extremities: Right lower extremity surgical dressings.  Not removed.  Swelling improved Skin: No rashes, lesions or  ulcers Psychiatry: Judgement and insight appear normal. Mood & affect appropriate.     Condition at discharge: good  Discharge time spent: greater than 30 minutes.  Signed: Verline Lema, MD [facility] 09/12/2022

## 2022-09-12 NOTE — Progress Notes (Signed)
      Daily Progress Note   Assessment/Planning:   POD #1 s/p TE IVC, B CIV, R EIA, PTA+S B CIV  Sx improved Will need anticoagulation.  Suspect Lovenox likely preferred given his underlying cancer F/U Dr. Ronalee Belts in the office in 2 weeks   Subjective  - 1 Day Post-Op   Legs feel better   Objective   Vitals:   09/11/22 1915 09/11/22 1954 09/12/22 0435 09/12/22 0809  BP: 135/84 120/76 132/82 132/81  Pulse: 89 85 91 82  Resp: '18 16 18 18  '$ Temp:  98.7 F (37.1 C) 98.6 F (37 C) 97.9 F (36.6 C)  TempSrc:      SpO2: 95% 97% 97% 98%  Weight:      Height:         Intake/Output Summary (Last 24 hours) at 09/12/2022 0917 Last data filed at 09/12/2022 0528 Gross per 24 hour  Intake 911.66 ml  Output 600 ml  Net 311.66 ml    VASC BLE legs soft, 1+ edema, B groins without active bleeding, no hematoma    Laboratory   CBC    Latest Ref Rng & Units 09/12/2022    5:16 AM 09/11/2022    8:31 AM 09/10/2022    5:05 AM  CBC  WBC 4.0 - 10.5 K/uL 5.8  5.4  6.3   Hemoglobin 13.0 - 17.0 g/dL 7.2  7.6  7.8   Hematocrit 39.0 - 52.0 % 20.8  22.1  23.1   Platelets 150 - 400 K/uL 238  200  135     BMET    Component Value Date/Time   NA 133 (L) 09/10/2022 0505   NA 142 12/02/2021 1332   K 3.5 09/10/2022 0505   CL 103 09/10/2022 0505   CO2 23 09/10/2022 0505   GLUCOSE 90 09/10/2022 0505   BUN 21 09/10/2022 0505   BUN 12 12/02/2021 1332   CREATININE 1.13 09/10/2022 0505   CREATININE 1.20 09/07/2022 1006   CALCIUM 8.2 (L) 09/10/2022 0505   GFRNONAA >60 09/10/2022 0505   GFRNONAA >60 09/07/2022 1006   GFRAA 90 06/10/2020 0855     Adele Barthel, MD, FACS, FSVS Covering for  Vascular and Vein Surgery: [phone]  09/12/2022, 9:17 AM

## 2022-09-12 NOTE — Consult Note (Signed)
ANTICOAGULATION CONSULT NOTE - Initial Consult  Pharmacy Consult for heparin infusion Indication: DVT  Allergies  Allergen Reactions   Amoxicillin Itching   Atenolol Diarrhea    Other reaction(s): HIVES   Other Nausea And Vomiting    Navy Beans   Oxycodone     Other reaction(s): HIVES   Shellfish Allergy Itching and Swelling    Patient Measurements: Height: '5\' 9"'$  (175.3 cm) Weight: 79 kg (174 lb 2.6 oz) IBW/kg (Calculated) : 70.7 Heparin Dosing Weight: 79 kg  Vital Signs: Temp: 98.7 F (37.1 C) (03/01 1954) Temp Source: Oral (03/01 1600) BP: 120/76 (03/01 1954) Pulse Rate: 85 (03/01 1954)  Labs: Recent Labs    09/09/22 0152 09/09/22 1018 09/09/22 1640 09/10/22 0505 09/11/22 0405 09/11/22 0831 09/12/22 0101  HGB 8.4*  --   --  7.8*  --  7.6*  --   HCT 24.4*  --   --  23.1*  --  22.1*  --   PLT 125*  --   --  135*  --  200  --   HEPARINUNFRC 0.28* 0.63   < > 0.53 0.47  --  0.43  CREATININE  --  1.10  --  1.13  --   --   --    < > = values in this interval not displayed.     Estimated Creatinine Clearance: 65.2 mL/min (by C-G formula based on SCr of 1.13 mg/dL).   Medical History: Past Medical History:  Diagnosis Date   Cancer [facility]) 2013   bladder   Diverticulitis    History of chickenpox    History of measles    History of mumps    Hyperlipidemia    Hypertension     Medications:  PTA: N/A Inpatient: heparin infusion (2/26 >>>) Allergies: No AC/APT related allergies  Assessment: 67 yearold male presents to ED with compliant of severe leg pain. US DVT Study on right lower extremity showing Occlusive DVT in the right common femoral and profundus femoral veins as well as the right saphenofemoral junction. Pharmacy consulted for management of heparin infusion in the setting of DVT s/p mechanical thrombectomy on 3/1.   Date Time aPTT/HL Rate/Comment 2/27     0019      0.26              SUBtherapeutic @ 1400 u/hr 2/27 0701    0.34  Therapeutic x  1 2/28     0152      0.28              SUBtherapeutic X 1  2/28 1018    0.63  Therapeutic x 1 2/28 1640    0.57  Therapeutic x 2 2/29     0505       0.53             Therapeutic X 3  3/01     0405       0.47             Therapeutic X 4 3/01 1627   Heparin drip stopped 3/01 1904   Heparin drip restarted 3/02     0101       0.43             Therapeutic X 5    Goal of Therapy:  Heparin level 0.3-0.7 units/ml Monitor platelets by anticoagulation protocol: Yes   Plan: 3/02:  HL @ 0101 = 0.43, therapeutic X 5 - Will continue pt on current rate and recheck  HL on 3/03 with AM labs.  Continue to monitor H&H and platelets daily while on heparin gtt.   Yazmyn Valbuena D 09/12/2022 1:42 AM

## 2022-09-12 NOTE — Progress Notes (Signed)
Received MD order to discharge patient to home, reviewed discharge instructions,  procedure site care, prescriptions and follow up appointments with patient and wife and both verbalized understanding.

## 2022-09-14 ENCOUNTER — Telehealth: Payer: Self-pay

## 2022-09-14 ENCOUNTER — Encounter: Payer: Self-pay | Admitting: Vascular Surgery

## 2022-09-14 DIAGNOSIS — C642 Malignant neoplasm of left kidney, except renal pelvis: Secondary | ICD-10-CM

## 2022-09-14 NOTE — Telephone Encounter (Signed)
I spoke with the patient today since he was discharged to see how he's doing. States he is up and walking around, eating and drinking better. States he is feeling much better now. Reminded him to watch the videos for the financial navigation study since he's at home. States he and his wife are going to do that when she gets home from work. She has the web site information to log on. He says he lets her handle that technical stuff right now. Encouraged the patient to call if he has any questions or concerns. Encouraged him to continue to get some exercise and move around as well as drinking his fluids.  Jeral Fruit, RN 09/14/22 2:24 PM

## 2022-09-14 NOTE — Transitions of Care (Post Inpatient/ED Visit) (Signed)
   09/14/2022  Name: ENGLAND SCHWIESOW MRN: [id number] DOB: 1956/03/01  Today's TOC FU Call Status: Today's TOC FU Call Status:: Successful TOC FU Call Competed TOC FU Call Complete Date: 09/14/22  Transition Care Management Follow-up Telephone Call Date of Discharge: 09/12/22 Discharge Facility: [facility]) Type of Discharge: Inpatient Admission Primary Inpatient Discharge Diagnosis:: acute embolism right feroral vein How have you been since you were released from the hospital?: Better Any questions or concerns?: No  Items Reviewed: Did you receive and understand the discharge instructions provided?: Yes Medications obtained and verified?: Yes (Medications Reviewed) Any new allergies since your discharge?: No Dietary orders reviewed?: Yes Do you have support at home?: Yes People in Home: spouse  Home Care and Equipment/Supplies: [place] Ordered?: NA Any new equipment or medical supplies ordered?: NA  Functional Questionnaire: Do you need assistance with bathing/showering or dressing?: No Do you need assistance with meal preparation?: No Do you need assistance with eating?: No Do you have difficulty maintaining continence: No Do you need assistance with getting out of bed/getting out of a chair/moving?: No Do you have difficulty managing or taking your medications?: No  Folllow up appointments reviewed: PCP Follow-up appointment confirmed?: NA Specialist Hospital Follow-up appointment confirmed?: No Reason Specialist Follow-Up Not Confirmed: Patient has Specialist Provider Number and will Call for Appointment Do you need transportation to your follow-up appointment?: No Do you understand care options if your condition(s) worsen?: Yes-patient verbalized understanding    SIGNATURE Juanda Crumble, [place] Nurse Health Advisor Direct Dial [phone]

## 2022-09-14 NOTE — Telephone Encounter (Signed)
Patient has already completed TOC call with another team member

## 2022-09-16 ENCOUNTER — Inpatient Hospital Stay: Payer: BLUE CROSS/BLUE SHIELD | Attending: Oncology | Admitting: Oncology

## 2022-09-16 ENCOUNTER — Inpatient Hospital Stay: Payer: BLUE CROSS/BLUE SHIELD

## 2022-09-16 ENCOUNTER — Encounter: Payer: Self-pay | Admitting: Oncology

## 2022-09-16 DIAGNOSIS — E876 Hypokalemia: Secondary | ICD-10-CM | POA: Diagnosis not present

## 2022-09-16 DIAGNOSIS — E860 Dehydration: Secondary | ICD-10-CM

## 2022-09-16 DIAGNOSIS — C641 Malignant neoplasm of right kidney, except renal pelvis: Secondary | ICD-10-CM | POA: Insufficient documentation

## 2022-09-16 DIAGNOSIS — Z79899 Other long term (current) drug therapy: Secondary | ICD-10-CM | POA: Diagnosis not present

## 2022-09-16 DIAGNOSIS — R5383 Other fatigue: Secondary | ICD-10-CM | POA: Diagnosis not present

## 2022-09-16 DIAGNOSIS — E871 Hypo-osmolality and hyponatremia: Secondary | ICD-10-CM | POA: Diagnosis not present

## 2022-09-16 DIAGNOSIS — C7802 Secondary malignant neoplasm of left lung: Secondary | ICD-10-CM | POA: Diagnosis not present

## 2022-09-16 DIAGNOSIS — Z5111 Encounter for antineoplastic chemotherapy: Secondary | ICD-10-CM | POA: Insufficient documentation

## 2022-09-16 DIAGNOSIS — R110 Nausea: Secondary | ICD-10-CM

## 2022-09-16 DIAGNOSIS — C7801 Secondary malignant neoplasm of right lung: Secondary | ICD-10-CM | POA: Insufficient documentation

## 2022-09-16 DIAGNOSIS — R630 Anorexia: Secondary | ICD-10-CM | POA: Insufficient documentation

## 2022-09-16 DIAGNOSIS — Z95828 Presence of other vascular implants and grafts: Secondary | ICD-10-CM

## 2022-09-16 DIAGNOSIS — R531 Weakness: Secondary | ICD-10-CM | POA: Insufficient documentation

## 2022-09-16 LAB — CBC WITH DIFFERENTIAL (CANCER CENTER ONLY)
Abs Immature Granulocytes: 0.43 10*3/uL — ABNORMAL HIGH (ref 0.00–0.07)
Basophils Absolute: 0.1 10*3/uL (ref 0.0–0.1)
Basophils Relative: 1 %
Eosinophils Absolute: 0.2 10*3/uL (ref 0.0–0.5)
Eosinophils Relative: 1 %
HCT: 24.4 % — ABNORMAL LOW (ref 39.0–52.0)
Hemoglobin: 8.3 g/dL — ABNORMAL LOW (ref 13.0–17.0)
Immature Granulocytes: 3 %
Lymphocytes Relative: 10 %
Lymphs Abs: 1.2 10*3/uL (ref 0.7–4.0)
MCH: 31.3 pg (ref 26.0–34.0)
MCHC: 34 g/dL (ref 30.0–36.0)
MCV: 92.1 fL (ref 80.0–100.0)
Monocytes Absolute: 1.5 10*3/uL — ABNORMAL HIGH (ref 0.1–1.0)
Monocytes Relative: 11 %
Neutro Abs: 9.6 10*3/uL — ABNORMAL HIGH (ref 1.7–7.7)
Neutrophils Relative %: 74 %
Platelet Count: 633 10*3/uL — ABNORMAL HIGH (ref 150–400)
RBC: 2.65 MIL/uL — ABNORMAL LOW (ref 4.22–5.81)
RDW: 14 % (ref 11.5–15.5)
WBC Count: 12.9 10*3/uL — ABNORMAL HIGH (ref 4.0–10.5)
nRBC: 0 % (ref 0.0–0.2)

## 2022-09-16 LAB — CMP (CANCER CENTER ONLY)
ALT: 29 U/L (ref 0–44)
AST: 20 U/L (ref 15–41)
Albumin: 2.7 g/dL — ABNORMAL LOW (ref 3.5–5.0)
Alkaline Phosphatase: 126 U/L (ref 38–126)
Anion gap: 9 (ref 5–15)
BUN: 11 mg/dL (ref 8–23)
CO2: 25 mmol/L (ref 22–32)
Calcium: 8.5 mg/dL — ABNORMAL LOW (ref 8.9–10.3)
Chloride: 97 mmol/L — ABNORMAL LOW (ref 98–111)
Creatinine: 1.11 mg/dL (ref 0.61–1.24)
GFR, Estimated: >60 mL/min (ref >60)
Glucose, Bld: 103 mg/dL — ABNORMAL HIGH (ref 70–99)
Potassium: 3.1 mmol/L — ABNORMAL LOW (ref 3.5–5.1)
Sodium: 131 mmol/L — ABNORMAL LOW (ref 135–145)
Total Bilirubin: 0.5 mg/dL (ref 0.3–1.2)
Total Protein: 6.4 g/dL — ABNORMAL LOW (ref 6.5–8.1)

## 2022-09-16 LAB — MAGNESIUM: Magnesium: 1.9 mg/dL (ref 1.7–2.4)

## 2022-09-16 MED ORDER — HEPARIN SOD (PORK) LOCK FLUSH 100 UNIT/ML IV SOLN
500.0000 [IU] | Freq: Once | INTRAVENOUS | Status: AC
  Administered 2022-09-16: 500 [IU] via INTRAVENOUS
  Filled 2022-09-16: qty 5

## 2022-09-16 MED ORDER — SODIUM CHLORIDE 0.9% FLUSH
10.0000 mL | Freq: Once | INTRAVENOUS | Status: AC
  Administered 2022-09-16: 10 mL via INTRAVENOUS
  Filled 2022-09-16: qty 10

## 2022-09-16 MED FILL — Fosaprepitant Dimeglumine For IV Infusion 150 MG (Base Eq): INTRAVENOUS | Qty: 5 | Status: AC

## 2022-09-16 MED FILL — Dexamethasone Sodium Phosphate Inj 100 MG/10ML: INTRAMUSCULAR | Qty: 1 | Status: AC

## 2022-09-16 NOTE — Progress Notes (Signed)
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  INTERVAL HISTORY: Patient returns to clinic today for further evaluation, hospital follow-up, and consideration of cycle 2, day 1 of cisplatin and gemcitabine.  He feels significantly improved since discharge.  His appetite has improved and he is gaining weight in the interim.  His leg swelling is still evident, but also improved.  He does not complain of pain today. He has no neurologic complaints.  He denies any recent fevers or illnesses.  He has no chest pain, shortness of breath, cough, or hemoptysis.  He denies any nausea, vomiting, or constipation.  He has no urinary complaints.  Patient offers no further sipper complaints today.  REVIEW OF SYSTEMS:   Review of Systems  Constitutional:  Positive for malaise/fatigue. Negative for fever and weight loss.  Respiratory: Negative.  Negative for cough, hemoptysis and shortness of breath.   Cardiovascular:  Positive for leg swelling. Negative for chest pain.  Gastrointestinal: Negative.  Negative for abdominal pain, diarrhea and nausea.  Genitourinary: Negative.  Negative for flank pain and hematuria.  Musculoskeletal:  Negative for back pain.  Skin: Negative.  Negative for rash.  Neurological:  Positive for weakness. Negative for dizziness, focal weakness and headaches.  Psychiatric/Behavioral: Negative.  The patient is not nervous/anxious.     As per HPI. Otherwise, a complete review of systems is negative.  PAST MEDICAL HISTORY: Past Medical History:  Diagnosis Date   Cancer [facility]) 2013   bladder   Diverticulitis    History of  chickenpox    History of measles    History of mumps    Hyperlipidemia    Hypertension     PAST SURGICAL HISTORY: Past Surgical History:  Procedure Laterality Date   HERNIA REPAIR  2002,2003   IR IMAGING GUIDED PORT INSERTION  08/25/2022   IVC VENOGRAPHY N/A 09/11/2022   Procedure: IVC Venography;  Surgeon: Katha Cabal, MD;  Location: [place] [facility];  Service: Cardiovascular;  Laterality: N/A;   PERIPHERAL VASCULAR THROMBECTOMY Right 09/08/2022   Procedure: PERIPHERAL VASCULAR THROMBECTOMY;  Surgeon: Katha Cabal, MD;  Location: [place] [facility];  Service: Cardiovascular;  Laterality: Right;   SIGMOIDOSCOPY  1998   [facility]   TRANSURETHRAL RESECTION OF BLADDER TUMOR  05/11/2012   Dr. Sherral Hammers, [place]  2002   Bilateral inguinal herniorrhaphies    FAMILY HISTORY: Family History  Problem Relation Age of Onset   COPD Mother    Diverticulosis Mother    Diverticulosis Father    Diabetes Father    Hypertension Father    Diverticulosis Sister    Stroke Paternal Grandmother     ADVANCED DIRECTIVES (Y/N):  N  HEALTH MAINTENANCE: Social History   Tobacco Use   Smoking status: Former    Packs/day: 2.00    Years: 39.00    Total pack years: 78.00    Types: Cigarettes    Quit date: 07/14/2011    Years since quitting: 11.1    Passive exposure: Past   Smokeless tobacco: Never   Tobacco comments:    smoked 1 to 1 1/2  ppd for 35 years  Vaping Use   Vaping Use:  Never used  Substance Use Topics   Alcohol use: Yes    Comment: drinks 2 beers daily: none since 1 week ago   Drug use: No     Colonoscopy:  PAP:  Bone density:  Lipid panel:  Allergies  Allergen Reactions   Amoxicillin Itching   Atenolol Diarrhea    Other reaction(s): HIVES   Other Nausea And Vomiting    [employer]   Oxycodone     Other reaction(s): HIVES   Shellfish Allergy Itching and Swelling    Current Outpatient Medications  Medication Sig Dispense  Refill   acetaminophen (TYLENOL) 500 MG tablet Take 500 mg by mouth every 6 (six) hours as needed.     apixaban (ELIQUIS) 5 MG TABS tablet Take 2 tablets (10 mg total) by mouth 2 (two) times daily. 28 tablet 0   [START ON 09/19/2022] apixaban (ELIQUIS) 5 MG TABS tablet Take 1 tablet (5 mg total) by mouth 2 (two) times daily. 360 tablet 0   lidocaine-prilocaine (EMLA) cream Apply to affected area once 30 g 3   pantoprazole (PROTONIX) 40 MG tablet TAKE 1 TABLET BY MOUTH EVERY DAY 90 tablet 2   sildenafil (REVATIO) 20 MG tablet Take 1-3 tablets (20-60 mg total) by mouth daily as needed. 30 tablet 11   tamsulosin (FLOMAX) 0.4 MG CAPS capsule Take 1 capsule (0.4 mg total) by mouth daily. 30 capsule 11   aspirin 81 MG tablet Take 81 mg by mouth daily. (Patient not taking: Reported on 09/16/2022)     chlorproMAZINE (THORAZINE) 10 MG tablet Take 1 tablet (10 mg total) by mouth 3 (three) times daily as needed for hiccoughs or nausea. Do not take when taking Zofran/ondansetron. (Patient not taking: Reported on 09/16/2022) 30 tablet 0   docusate sodium (COLACE) 250 MG capsule Take 250 mg by mouth daily. (Patient not taking: Reported on 09/16/2022)     fentaNYL (DURAGESIC) 25 MCG/HR Place 1 patch onto the skin every 3 (three) days. (Patient not taking: Reported on 09/16/2022) 10 patch 0   HYDROcodone-acetaminophen (NORCO) 5-325 MG tablet Take 1 tablet by mouth every 6 (six) hours as needed for moderate pain. (Patient not taking: Reported on 09/16/2022) 120 tablet 0   losartan-hydrochlorothiazide (HYZAAR) 100-12.5 MG tablet TAKE 1 TABLET BY MOUTH EVERY DAY (Patient not taking: Reported on 09/16/2022) 90 tablet 0   metoprolol succinate (TOPROL-XL) 25 MG 24 hr tablet TAKE 1 TABLET (25 MG TOTAL) BY MOUTH DAILY. (Patient not taking: Reported on 09/16/2022) 90 tablet 2   ondansetron (ZOFRAN) 8 MG tablet Take 1 tablet (8 mg total) by mouth every 8 (eight) hours as needed for nausea or vomiting. (Patient not taking: Reported on  09/16/2022) 90 tablet 3   ondansetron (ZOFRAN-ODT) 8 MG disintegrating tablet Take 8 mg by mouth every 8 (eight) hours as needed. (Patient not taking: Reported on 09/16/2022)     prochlorperazine (COMPAZINE) 10 MG tablet Take 1 tablet (10 mg total) by mouth every 6 (six) hours as needed (Nausea or vomiting). (Patient not taking: Reported on 09/16/2022) 60 tablet 2   traZODone (DESYREL) 50 MG tablet Take 0.5 tablets (25 mg total) by mouth at bedtime as needed for sleep. (Patient not taking: Reported on 09/16/2022) 30 tablet 0   No current facility-administered medications for this visit.    OBJECTIVE: Vitals:   09/16/22 1100  BP: 120/85  Pulse: (!) 112  Resp: 18  Temp: 99.5 F (37.5 C)  SpO2: 99%     Body mass index is 24.66  kg/m.    ECOG FS:2 - Symptomatic, <50% confined to bed  General: Well-developed, well-nourished, no acute distress.  Sitting in a wheelchair. Eyes: Pink conjunctiva, anicteric sclera. HEENT: Normocephalic, moist mucous membranes. Lungs: No audible wheezing or coughing. Heart: Regular rate and rhythm. Abdomen: Soft, nontender, no obvious distention. Musculoskeletal: No edema, cyanosis, or clubbing. Neuro: Alert, answering all questions appropriately. Cranial nerves grossly intact. Skin: No rashes or petechiae noted. Psych: Normal affect.  LAB RESULTS:  Lab Results  Component Value Date   NA 133 (L) 09/10/2022   K 3.5 09/10/2022   CL 103 09/10/2022   CO2 23 09/10/2022   GLUCOSE 90 09/10/2022   BUN 21 09/10/2022   CREATININE 1.13 09/10/2022   CALCIUM 8.2 (L) 09/10/2022   PROT 5.6 (L) 09/10/2022   ALBUMIN 2.6 (L) 09/10/2022   AST 24 09/10/2022   ALT 34 09/10/2022   ALKPHOS 130 (H) 09/10/2022   BILITOT 0.5 09/10/2022   GFRNONAA >60 09/10/2022   GFRAA 90 06/10/2020    Lab Results  Component Value Date   WBC 12.9 (H) 09/16/2022   NEUTROABS 9.6 (H) 09/16/2022   HGB 8.3 (L) 09/16/2022   HCT 24.4 (L) 09/16/2022   MCV 92.1 09/16/2022   PLT 633 (H)  09/16/2022     STUDIES: PERIPHERAL VASCULAR CATHETERIZATION  Result Date: 09/11/2022 See surgical note for result.  ECHOCARDIOGRAM COMPLETE  Result Date: 09/10/2022    ECHOCARDIOGRAM REPORT   Patient Name:   Jeffrey Fernandez Date of Exam: 09/09/2022 Medical Rec #:  [id number]      Height:       69.0 in Accession #:    [id number]     Weight:       174.2 lb Date of Birth:  30-Apr-1956      BSA:          1.948 m Patient Age:    68 years       BP:           120/74 mmHg Patient Gender: M              HR:           97 bpm. Exam Location:  [facility] Procedure: 2D Echo, Cardiac Doppler and Color Doppler Indications:     R55 Syncope                  I26.09 Pulmonary Embolus  History:         Patient has no prior history of Echocardiogram examinations.                  Risk Factors:Hypertension and Dyslipidemia. Cancer.  Sonographer:     Cresenciano Lick RDCS Referring Phys:  [id number] [facility] Diagnosing Phys: Ida Rogue MD IMPRESSIONS  1. Left ventricular ejection fraction, by estimation, is 60 to 65%. The left ventricle has normal function. The left ventricle has no regional wall motion abnormalities. There is mild left ventricular hypertrophy. Left ventricular diastolic parameters are consistent with Grade I diastolic dysfunction (impaired relaxation).  2. Right ventricular systolic function is normal. The right ventricular size is normal. Tricuspid regurgitation signal is inadequate for assessing PA pressure.  3. The mitral valve is normal in structure. Mild mitral valve regurgitation. No evidence of mitral stenosis.  4. The aortic valve is normal in structure. Aortic valve regurgitation is not visualized. No aortic stenosis is present.  5. The inferior vena cava is normal in size with greater than 50% respiratory variability, suggesting right atrial  pressure of 3 mmHg. FINDINGS  Left Ventricle: Left ventricular ejection fraction, by estimation, is 60 to 65%. The left ventricle has normal function. The  left ventricle has no regional wall motion abnormalities. The left ventricular internal cavity size was normal in size. There is  mild left ventricular hypertrophy. Left ventricular diastolic parameters are consistent with Grade I diastolic dysfunction (impaired relaxation). Right Ventricle: The right ventricular size is normal. No increase in right ventricular wall thickness. Right ventricular systolic function is normal. Tricuspid regurgitation signal is inadequate for assessing PA pressure. Left Atrium: Left atrial size was normal in size. Right Atrium: Right atrial size was normal in size. Pericardium: There is no evidence of pericardial effusion. Mitral Valve: The mitral valve is normal in structure. Mild mitral valve regurgitation. No evidence of mitral valve stenosis. Tricuspid Valve: The tricuspid valve is normal in structure. Tricuspid valve regurgitation is mild . No evidence of tricuspid stenosis. Aortic Valve: The aortic valve is normal in structure. Aortic valve regurgitation is not visualized. No aortic stenosis is present. Pulmonic Valve: The pulmonic valve was normal in structure. Pulmonic valve regurgitation is not visualized. No evidence of pulmonic stenosis. Aorta: The aortic root is normal in size and structure. Venous: The inferior vena cava is normal in size with greater than 50% respiratory variability, suggesting right atrial pressure of 3 mmHg. IAS/Shunts: No atrial level shunt detected by color flow Doppler.  LEFT VENTRICLE PLAX 2D LVIDd:         4.60 cm   Diastology LVIDs:         2.90 cm   LV e' medial:    8.16 cm/s LV PW:         0.80 cm   LV E/e' medial:  9.2 LV IVS:        0.80 cm   LV e' lateral:   10.95 cm/s LVOT diam:     2.10 cm   LV E/e' lateral: 6.9 LV SV:         50 LV SV Index:   26 LVOT Area:     3.46 cm  RIGHT VENTRICLE          IVC RV Basal diam:  3.40 cm  IVC diam: 1.40 cm RV Mid diam:    3.90 cm LEFT ATRIUM             Index        RIGHT ATRIUM           Index LA diam:         3.70 cm 1.90 cm/m   RA Area:     10.10 cm LA Vol (A2C):   39.9 ml 20.48 ml/m  RA Volume:   23.00 ml  11.81 ml/m LA Vol (A4C):   27.1 ml 13.91 ml/m LA Biplane Vol: 35.8 ml 18.38 ml/m  AORTIC VALVE LVOT Vmax:   100.07 cm/s LVOT Vmean:  66.133 cm/s LVOT VTI:    0.145 m  AORTA Ao Root diam: 3.80 cm Ao Asc diam:  3.30 cm MITRAL VALVE MV Area (PHT): 4.96 cm    SHUNTS MV Decel Time: 153 msec    Systemic VTI:  0.15 m MV E velocity: 75.45 cm/s  Systemic Diam: 2.10 cm MV A velocity: 76.50 cm/s MV E/A ratio:  0.99 Ida Rogue MD Electronically signed by Ida Rogue MD Signature Date/Time: 09/10/2022/7:28:09 AM    Final    PERIPHERAL VASCULAR CATHETERIZATION  Result Date: 09/08/2022 See surgical note for result.  CT Angio Chest PE  W and/or Wo Contrast  Result Date: 09/07/2022 CLINICAL DATA:  Stage IV ureteral carcinoma. Hypotension and syncope. * Tracking Code: BO * EXAM: CT ANGIOGRAPHY CHEST WITH CONTRAST TECHNIQUE: Multidetector CT imaging of the chest was performed using the standard protocol during bolus administration of intravenous contrast. Multiplanar CT image reconstructions and MIPs were obtained to evaluate the vascular anatomy. RADIATION DOSE REDUCTION: This exam was performed according to the departmental dose-optimization program which includes automated exposure control, adjustment of the mA and/or kV according to patient size and/or use of iterative reconstruction technique. CONTRAST:  155m OMNIPAQUE IOHEXOL 350 MG/ML SOLN COMPARISON:  PET-CT 08/14/2022.  Chest CT 08/05/2022 noncontrast FINDINGS: Cardiovascular: Heart is nonenlarged. Trace pericardial fluid. Right upper chest port in place. The port is accessed. In the right lower lobe is a filling defect along the medial branch of the right lower lobe pulmonary artery consistent with a subsegmental pulmonary embolism. No other larger and more central emboli are identified. Mediastinum/Nodes: No specific abnormal lymph node enlargement  identified in the axillary regions. There are several abnormal mediastinal and hilar lymph nodes once again identified. These are increased from the study of 08/05/2022. Example to the left of the main pulmonary artery on series 4, image 67 measures 2.3 x 1.7 cm. Right hilar node anteriorly on series 4, image 68 measures 15 by 12 mm. Left hilar node on image 80 of series 4 measures 16 by 11 mm. Several other nodes are identified. There are also some prominent nodes along the thoracic inlet on the left side. All of these are also larger than the PET-CT scan of 08/14/22. Normal caliber thoracic esophagus. Lungs/Pleura: No pneumothorax or consolidation. Trace right-sided pleural fluid. Once again there are numerous bilateral lung nodules of varying size but the vast majority are under a cm. At least 15 nodules are identified. There is 1 lesion along the right lower lobe on image 95 of series 6 which is cavitary measuring 2.3 by 1.9 cm. Example nodule in the left lung, lower lobe on series 6, image 97 measures 12 by 10 mm. All of these are increased in size and number. Upper Abdomen: There are multiple perinephric space nodules involving the right kidney as well as poor visualization of the parenchyma of the right kidney consistent with known mass lesions. There is also nodular enlargement of the adrenal glands which also appears to have increased. Musculoskeletal: Mild degenerative changes along the spine. Review of the MIP images confirms the above findings. Critical Value/emergent results were called by telephone at the time of interpretation on 09/07/2022 at 2:07 pm to provider Dr. SJoni Fears who verbally acknowledged these results. IMPRESSION: Progression of metastatic disease. This includes lung nodules, mediastinal lymph nodes and upper abdominal lesions. Right lower lobe pulmonary emboli. Please see separate dictation of abdomen and pelvis CT from same day Electronically Signed   By: AJill SideM.D.   On:  09/07/2022 17:21   CT ABDOMEN PELVIS W CONTRAST  Result Date: 09/07/2022 CLINICAL DATA:  Epigastric pain again leg swelling. 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 * Tracking Code: BO * EXAM: CT ABDOMEN AND PELVIS WITH CONTRAST TECHNIQUE: Multidetector CT imaging of the abdomen and pelvis was performed using the standard protocol following bolus administration of intravenous contrast. RADIATION DOSE REDUCTION: This exam was performed according to the departmental dose-optimization program which includes automated exposure control, adjustment of the mA and/or kV according to patient size and/or use of iterative reconstruction technique. CONTRAST:  1069mOMNIPAQUE IOHEXOL 350 MG/ML SOLN COMPARISON:  PET-CT 08/14/2022.  Standard CT 07/31/2022 FINDINGS: Lower chest: Please see separate dictation of chest CT angiogram from same date Hepatobiliary: Liver metastases are noted. Central lesion in the right hepatic lobe with some peripheral segmental biliary duct ectasia is now identified on series 2, image 15 measuring 2.2 x 1.6 cm. The duct dilatation is likely related to secondary mass effect. Small lesions are also seen peripherally in the right hepatic lobe on image 17 and 21 in segment 4. Portal vein is patent. Gallbladder is nondilated. Pancreas: Unremarkable. No pancreatic ductal dilatation or surrounding inflammatory changes. Spleen: Normal in size without focal abnormality. Adrenals/Urinary Tract: Increasing left adrenal nodule. On image 26 of series 2 this measures 17 x 15 mm. There is also an increasing right adrenal metastasis. Left kidney has some small Bosniak 1 cysts, unchanged from prior. No left-sided renal collecting system dilatation. Left ureter has a normal course and caliber down to the bladder. Preserved contours of the urinary bladder. There is large infiltrative mass involving the right kidney along its posterior aspect. Previously this measured 6.0 x 4.6 cm and on the current  examination 6.5 by 4.4 cm. Once again there is ill-defined margins extending into the renal hilum collecting system. In addition there is marked increase in the number and extent of perinephric space nodules diffusely. Example superiorly on series 28 of series 2 measuring 15 by 14 mm. Previously this measured less than 5 mm. There is significant retroperitoneal stranding identified which is significantly increased from prior. Stomach/Bowel: Large bowel has a normal course and caliber. Scattered diverticula. No smaller large bowel dilatation. The stomach is underdistended. Vascular/Lymphatic: Normal caliber aorta with scattered vascular calcifications. There is significant increase in retroperitoneal abnormal lymph nodes. Example retrocaval on series 2 image 37 measures 4.7 x 2.7 cm. On the patient's prior PET-CT this measured 4.0 x 2.2 cm. There is several others which have increased as well and now cause significant compression of the IVC. Another example is a left para-aortic node which previously would have had dimensions of 11 mm in transverse and today 14 mm on series 2, image 36. Of note caudal to the area of narrowing of the IVC the veins are distended all the way into the area of the lower extremities. There is also edema of the right lower extremity. Please correlate with separate ultrasound of the legs for DVT from same day. Of note, 1 of the retrocaval mass lesions identified does appear to cause deformity of the adjacent vertebral body at L2 and extend into the vertebral body itself. Reproductive: Enlarged prostate with mass effect along the base of the bladder. Other: Anasarca.  Nonspecific presacral stranding. Musculoskeletal: Degenerative changes of the spine and pelvis. Pars defects again L5. Once again there is invasion of a retroperitoneal node into the margin of the vertebral body at L2 best seen on sagittal image 54 and axial image 38. Again new from previous examinations. Critical Value/emergent  results were called by telephone at the time of interpretation on 09/07/2022 at 2:19 pm to provider Dr. Joni Fears, who verbally acknowledged these results. IMPRESSION: Marked increase of neoplasm metastatic disease including size of the primary right renal mass. Significant increase in size number perinephric nodules, bilateral adrenal masses, retroperitoneal nodes and liver lesions. The retroperitoneal lymphadenopathy does cause severe narrowing of the IVC focally and associated distention of caudal veins including the pelvis. Please correlate with separate DVT ultrasound. In addition 1 of the retroperitoneal nodes does appear to extend directly into and invade the L2 vertebral  body. No bowel obstruction or free air. Scattered anasarca including along the retroperitoneum and presacral regions. Electronically Signed   By: Jill Side M.D.   On: 09/07/2022 17:21   US Venous Img Lower Right (DVT Study)  Result Date: 09/07/2022 CLINICAL DATA:  Acute right lower extremity pain and edema. EXAM: Right LOWER EXTREMITY VENOUS DOPPLER ULTRASOUND TECHNIQUE: Gray-scale sonography with graded compression, as well as color Doppler and duplex ultrasound were performed to evaluate the lower extremity deep venous systems from the level of the common femoral vein and including the common femoral, femoral, profunda femoral, popliteal and calf veins including the posterior tibial, peroneal and gastrocnemius veins when visible. The superficial great saphenous vein was also interrogated. Spectral Doppler was utilized to evaluate flow at rest and with distal augmentation maneuvers in the common femoral, femoral and popliteal veins. COMPARISON:  None Available. FINDINGS: Contralateral Common Femoral Vein: Respiratory phasicity is normal and symmetric with the symptomatic side. No evidence of thrombus. Normal compressibility. Common Femoral Vein: Noncompressible with no flow consistent with occlusive thrombus. Saphenofemoral Junction:  Noncompressible with no flow consistent with occlusive thrombus. Profunda Femoral Vein: Noncompressible with no flow consistent with occlusive thrombus. Femoral Vein: Only partially compressible with possible rouleaux flow present. Popliteal Vein: Only partially compressible with possible rouleaux low flow present. Calf Veins: No evidence of thrombus. Normal compressibility and flow on color Doppler imaging. Superficial Great Saphenous Vein: No evidence of thrombus. Normal compressibility. Venous Reflux:  None. Other Findings: There also appears to be noncompressible thrombus in the visualized portions of the right common and external iliac veins. IMPRESSION: Occlusive deep venous thrombosis is noted in the right common femoral and profundus femoral veins as well as the right saphenofemoral junction. The right superficial femoral and popliteal veins are only partially compressible with probable rouleaux flow present. There also appears to be noncompressible thrombus within the visualized portions of the right common and external iliac veins. Electronically Signed   By: Marijo Conception M.D.   On: 09/07/2022 15:46   DG Chest Port 1 View  Result Date: 09/07/2022 CLINICAL DATA:  Provided history: Questionable sepsis. Evaluate for abnormality. EXAM: PORTABLE CHEST 1 VIEW COMPARISON:  PET-CT 08/14/2022. FINDINGS: Right chest infusion port catheter with tip at the level of the superior cavoatrial junction. Heart size within normal limits. Numerous bilateral pulmonary nodules consistent with known metastatic disease (as demonstrated on the recent prior PET-CT of 08/14/2022). No appreciable superimposed acute airspace consolidation. No evidence of pleural effusion or pneumothorax. No acute bony abnormality identified. Levocurvature of the thoracic spine. IMPRESSION: Numerous bilateral pulmonary nodules consistent with known metastatic disease (as demonstrated on the recent prior PET-CT of 08/14/2022). No appreciable  superimposed acute airspace consolidation. Electronically Signed   By: Kellie Simmering D.O.   On: 09/07/2022 14:23   IR IMAGING GUIDED PORT INSERTION  Result Date: 08/25/2022 INDICATION: Chemotherapy EXAM: Chest port placement using ultrasound and fluoroscopic guidance MEDICATIONS: Documented in the EMR ANESTHESIA/SEDATION: Moderate (conscious) sedation was employed during this procedure. A total of Versed 2 mg and Fentanyl 100 mcg was administered intravenously. Moderate Sedation Time: 28 minutes. The patient's level of consciousness and vital signs were monitored continuously by radiology nursing throughout the procedure under my direct supervision. FLUOROSCOPY TIME:  Fluoroscopy Time: 0.4 minutes (1 mGy) COMPLICATIONS: None immediate. PROCEDURE: Informed written consent was obtained from the patient after a thorough discussion of the procedural risks, benefits and alternatives. All questions were addressed. Maximal Sterile Barrier Technique was utilized including caps, mask, sterile gowns, sterile  gloves, sterile drape, hand hygiene and skin antiseptic. A timeout was performed prior to the initiation of the procedure. The patient was placed supine on the exam table. The right neck and chest was prepped and draped in the standard sterile fashion. A preliminary ultrasound of the right neck was performed and demonstrates a patent right internal jugular vein. A permanent ultrasound image was stored in the electronic medical record. The overlying skin was anesthetized with 1% Lidocaine. Using ultrasound guidance, access was obtained into the right internal jugular vein using a 21 gauge micropuncture set. A wire was advanced into the SVC, a short incision was made at the puncture site, and serial dilatation performed. Next, in an ipsilateral infraclavicular location, an incision was made at the site of the subcutaneous reservoir. Blunt dissection was used to open a pocket to contain the reservoir. A subcutaneous  tunnel was then created from the port site to the puncture site. A(n) 8 Fr single lumen catheter was advanced through the tunnel. The catheter was attached to the port and this was placed in the subcutaneous pocket. Under fluoroscopic guidance, a peel away sheath was placed, and the catheter was trimmed to the appropriate length and was advanced into the central veins. The catheter length is 22 cm. The tip of the catheter lies near the superior cavoatrial junction. The port flushes and aspirates appropriately. The port was flushed and locked with heparinized saline. The port pocket was closed in 2 layers using 3-0 and 4-0 Vicryl/absorbable suture. Dermabond was also applied to both incisions. The patient tolerated the procedure well and was transferred to recovery in stable condition. IMPRESSION: Successful placement of a right-sided chest port via the right internal jugular vein. The port is ready for use. Electronically Signed   By: Albin Felling M.D.   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  This lesion was not evident on CT scan from June 19, 2020.  PET scan results from August 14, 2022 reviewed independently with hypermetabolic kidney mass, retroperitoneal lymphadenopathy and multiple bilateral pulmonary nodules consistent with metastatic disease.  Biopsy confirmed diagnosis.  Repeat CT scan while patient was admitted to the hospital on September 07, 2022 appears to reveal progression of disease, but unsure the clinical significance of this with patient only having 1 cycle of chemotherapy.  He is receiving systemic chemotherapy using cisplatin and gemcitabine on day 1 with gemcitabine only on day 8.  This will be a  21-day cycle.  Patient has had port placement.  Patient wishes to proceed with cycle 2, day 1 of treatment tomorrow.  Will dose reduce both cisplatin and gemcitabine 20% given his difficulties with his first cycle and his recent hospitalization.  Return to clinic in 1 week for further evaluation and consideration of cycle 2, day 8.   Pain: Patient does not complain of this today.  Continue fentanyl patch and Norco as needed. Nausea: Continue antiemetics as needed. Diarrhea: Resolved.   Hyponatremia: Chronic and unchanged.  Patient sodium is 131. Renal insufficiency: Resolved.   Leukocytosis: Likely reactive, monitor. Anemia: Patient's hemoglobin is 8.3 today.  Continue to monitor and continue transfusion if necessary. Hypokalemia: Monitor.  Patient will receive 20 mEq IV potassium tomorrow along with his  cisplatin. DVT: Continue Eliquis as prescribed.  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  Follow-up with vascular surgery as scheduled.   Patient expressed understanding and was in agreement with this plan. He also understands that He can call clinic at any time with any questions, concerns, or complaints.    Cancer Staging  Urothelial carcinoma of kidney [facility]) Staging form: Kidney, AJCC 8th Edition - Clinical stage from 08/19/2022: Stage IV (cT4, cN1, cM1) - Signed by Lloyd Huger, MD on 08/19/2022   Lloyd Huger, MD   09/16/2022 11:26 AM

## 2022-09-16 NOTE — Progress Notes (Signed)
Having bilateral feet swelling that started a week ago.

## 2022-09-17 ENCOUNTER — Inpatient Hospital Stay: Payer: BLUE CROSS/BLUE SHIELD

## 2022-09-17 VITALS — BP 139/83 | HR 100 | Temp 96.0°F | Resp 18 | Ht 69.0 in | Wt 165.3 lb

## 2022-09-17 DIAGNOSIS — Z5111 Encounter for antineoplastic chemotherapy: Secondary | ICD-10-CM | POA: Diagnosis not present

## 2022-09-17 DIAGNOSIS — C641 Malignant neoplasm of right kidney, except renal pelvis: Secondary | ICD-10-CM

## 2022-09-17 MED ORDER — PALONOSETRON HCL INJECTION 0.25 MG/5ML
0.2500 mg | Freq: Once | INTRAVENOUS | Status: AC
  Administered 2022-09-17: 0.25 mg via INTRAVENOUS
  Filled 2022-09-17: qty 5

## 2022-09-17 MED ORDER — SODIUM CHLORIDE 0.9 % IV SOLN
Freq: Once | INTRAVENOUS | Status: AC
  Filled 2022-09-17: qty 250

## 2022-09-17 MED ORDER — POTASSIUM CHLORIDE IN NACL 20-0.9 MEQ/L-% IV SOLN
Freq: Once | INTRAVENOUS | Status: AC
  Filled 2022-09-17: qty 1000

## 2022-09-17 MED ORDER — SODIUM CHLORIDE 0.9 % IV SOLN
10.0000 mg | Freq: Once | INTRAVENOUS | Status: AC
  Administered 2022-09-17: 10 mg via INTRAVENOUS
  Filled 2022-09-17: qty 10

## 2022-09-17 MED ORDER — SODIUM CHLORIDE 0.9 % IV SOLN
150.0000 mg | Freq: Once | INTRAVENOUS | Status: AC
  Administered 2022-09-17: 150 mg via INTRAVENOUS
  Filled 2022-09-17: qty 150

## 2022-09-17 MED ORDER — MAGNESIUM SULFATE 2 GM/50ML IV SOLN
2.0000 g | Freq: Once | INTRAVENOUS | Status: AC
  Administered 2022-09-17: 2 g via INTRAVENOUS
  Filled 2022-09-17: qty 50

## 2022-09-17 MED ORDER — SODIUM CHLORIDE 0.9 % IV SOLN
800.0000 mg/m2 | Freq: Once | INTRAVENOUS | Status: AC
  Administered 2022-09-17: 1596 mg via INTRAVENOUS
  Filled 2022-09-17: qty 26.28

## 2022-09-17 MED ORDER — HEPARIN SOD (PORK) LOCK FLUSH 100 UNIT/ML IV SOLN
500.0000 [IU] | Freq: Once | INTRAVENOUS | Status: AC | PRN
  Administered 2022-09-17: 500 [IU]
  Filled 2022-09-17: qty 5

## 2022-09-17 MED ORDER — SODIUM CHLORIDE 0.9 % IV SOLN
56.0000 mg/m2 | Freq: Once | INTRAVENOUS | Status: AC
  Administered 2022-09-17: 111 mg via INTRAVENOUS
  Filled 2022-09-17: qty 111

## 2022-09-17 NOTE — Patient Instructions (Signed)
[facility]  Discharge Instructions: Thank you for choosing [facility] to provide your oncology and hematology care.  If you have a lab appointment with the [facility], please go directly to the [facility] and check in at the registration area.  Wear comfortable clothing and clothing appropriate for easy access to any Portacath or PICC line.   We strive to give you quality time with your provider. You may need to reschedule your appointment if you arrive late (15 or more minutes).  Arriving late affects you and other patients whose appointments are after yours.  Also, if you miss three or more appointments without notifying the office, you may be dismissed from the clinic at the provider's discretion.      For prescription refill requests, have your pharmacy contact our office and allow 72 hours for refills to be completed.    Today you received the following chemotherapy and/or immunotherapy agents CISPLATIN and GEMZAR      To help prevent nausea and vomiting after your treatment, we encourage you to take your nausea medication as directed.  BELOW ARE SYMPTOMS THAT SHOULD BE REPORTED IMMEDIATELY: *FEVER GREATER THAN 100.4 F (38 C) OR HIGHER *CHILLS OR SWEATING *NAUSEA AND VOMITING THAT IS NOT CONTROLLED WITH YOUR NAUSEA MEDICATION *UNUSUAL SHORTNESS OF BREATH *UNUSUAL BRUISING OR BLEEDING *URINARY PROBLEMS (pain or burning when urinating, or frequent urination) *BOWEL PROBLEMS (unusual diarrhea, constipation, pain near the anus) TENDERNESS IN MOUTH AND THROAT WITH OR WITHOUT PRESENCE OF ULCERS (sore throat, sores in mouth, or a toothache) UNUSUAL RASH, SWELLING OR PAIN  UNUSUAL VAGINAL DISCHARGE OR ITCHING   Items with * indicate a potential emergency and should be followed up as soon as possible or go to the Emergency Department if any problems should occur.  Please show the CHEMOTHERAPY ALERT CARD or IMMUNOTHERAPY ALERT CARD at  check-in to the Emergency Department and triage nurse.  Should you have questions after your visit or need to cancel or reschedule your appointment, please contact [facility]  [phone] and follow the prompts.  Office hours are 8:00 a.m. to 4:30 p.m. Monday - Friday. Please note that voicemails left after 4:00 p.m. may not be returned until the following business day.  We are closed weekends and major holidays. You have access to a nurse at all times for urgent questions. Please call the main number to the clinic [phone] and follow the prompts.  For any non-urgent questions, you may also contact your provider using MyChart. We now offer e-Visits for anyone 85 and older to request care online for non-urgent symptoms. For details visit [email or web address].   Also download the MyChart app! Go to the app store, search "MyChart", open the app, select [facility], and log in with your MyChart username and password.   Cisplatin Injection What is this medication? CISPLATIN (SIS pla tin) treats some types of cancer. It works by slowing down the growth of cancer cells. This medicine may be used for other purposes; ask your health care provider or pharmacist if you have questions. COMMON BRAND NAME(S): Platinol, Platinol -AQ What should I tell my care team before I take this medication? They need to know if you have any of these conditions: Eye disease, vision problems Hearing problems Kidney disease Low blood counts, such as low white cells, platelets, or red blood cells Tingling of the fingers or toes, or other nerve disorder An unusual or allergic reaction to cisplatin,  carboplatin, oxaliplatin, other medications, foods, dyes, or preservatives If you or your partner are pregnant or trying to get pregnant Breast-feeding How should I use this medication? This medication is injected into a vein. It is given by your care team in a hospital or clinic  setting. Talk to your care team about the use of this medication in children. Special care may be needed. Overdosage: If you think you have taken too much of this medicine contact a poison control center or emergency room at once. NOTE: This medicine is only for you. Do not share this medicine with others. What if I miss a dose? Keep appointments for follow-up doses. It is important not to miss your dose. Call your care team if you are unable to keep an appointment. What may interact with this medication? Do not take this medication with any of the following: Live virus vaccines This medication may also interact with the following: Certain antibiotics, such as amikacin, gentamicin, neomycin, polymyxin B, streptomycin, tobramycin, vancomycin Foscarnet This list may not describe all possible interactions. Give your health care provider a list of all the medicines, herbs, non-prescription drugs, or dietary supplements you use. Also tell them if you smoke, drink alcohol, or use illegal drugs. Some items may interact with your medicine. What should I watch for while using this medication? Your condition will be monitored carefully while you are receiving this medication. You may need blood work done while taking this medication. This medication may make you feel generally unwell. This is not uncommon, as chemotherapy can affect healthy cells as well as cancer cells. Report any side effects. Continue your course of treatment even though you feel ill unless your care team tells you to stop. This medication may increase your risk of getting an infection. Call your care team for advice if you get a fever, chills, sore throat, or other symptoms of a cold or flu. Do not treat yourself. Try to avoid being around people who are sick. Avoid taking medications that contain aspirin, acetaminophen, ibuprofen, naproxen, or ketoprofen unless instructed by your care team. These medications may hide a fever. This  medication may increase your risk to bruise or bleed. Call your care team if you notice any unusual bleeding. Be careful brushing or flossing your teeth or using a toothpick because you may get an infection or bleed more easily. If you have any dental work done, tell your dentist you are receiving this medication. Drink fluids as directed while you are taking this medication. This will help protect your kidneys. Call your care team if you get diarrhea. Do not treat yourself. Talk to your care team if you or your partner wish to become pregnant or think you might be pregnant. This medication can cause serious birth defects if taken during pregnancy and for 14 months after the last dose. A negative pregnancy test is required before starting this medication. A reliable form of contraception is recommended while taking this medication and for 14 months after the last dose. Talk to your care team about effective forms of contraception. Do not father a child while taking this medication and for 11 months after the last dose. Use a condom during sex during this time period. Do not breast-feed while taking this medication. This medication may cause infertility. Talk to your care team if you are concerned about your fertility. What side effects may I notice from receiving this medication? Side effects that you should report to your care team as soon as possible:  Allergic reactions--skin rash, itching, hives, swelling of the face, lips, tongue, or throat Eye pain, change in vision, vision loss Hearing loss, ringing in ears Infection--fever, chills, cough, sore throat, wounds that don't heal, pain or trouble when passing urine, general feeling of discomfort or being unwell Kidney injury--decrease in the amount of urine, swelling of the ankles, hands, or feet Low red blood cell level--unusual weakness or fatigue, dizziness, headache, trouble breathing Painful swelling, warmth, or redness of the skin, blisters or  sores at the infusion site Pain, tingling, or numbness in the hands or feet Unusual bruising or bleeding Side effects that usually do not require medical attention (report to your care team if they continue or are bothersome): Hair loss Nausea Vomiting This list may not describe all possible side effects. Call your doctor for medical advice about side effects. You may report side effects to FDA at 1-800-FDA-1088. Where should I keep my medication? This medication is given in a hospital or clinic. It will not be stored at home. NOTE: This sheet is a summary. It may not cover all possible information. If you have questions about this medicine, talk to your doctor, pharmacist, or health care provider.  2023 Elsevier/Gold Standard (2021-10-24 00:00:00)  Gemcitabine Injection What is this medication? GEMCITABINE (jem SYE ta been) treats some types of cancer. It works by slowing down the growth of cancer cells. This medicine may be used for other purposes; ask your health care provider or pharmacist if you have questions. COMMON BRAND NAME(S): Gemzar, Infugem What should I tell my care team before I take this medication? They need to know if you have any of these conditions: Blood disorders Infection Kidney disease Liver disease Lung or breathing disease, such as asthma or COPD Recent or ongoing radiation therapy An unusual or allergic reaction to gemcitabine, other medications, foods, dyes, or preservatives If you or your partner are pregnant or trying to get pregnant Breast-feeding How should I use this medication? This medication is injected into a vein. It is given by your care team in a hospital or clinic setting. Talk to your care team about the use of this medication in children. Special care may be needed. Overdosage: If you think you have taken too much of this medicine contact a poison control center or emergency room at once. NOTE: This medicine is only for you. Do not share  this medicine with others. What if I miss a dose? Keep appointments for follow-up doses. It is important not to miss your dose. Call your care team if you are unable to keep an appointment. What may interact with this medication? Interactions have not been studied. This list may not describe all possible interactions. Give your health care provider a list of all the medicines, herbs, non-prescription drugs, or dietary supplements you use. Also tell them if you smoke, drink alcohol, or use illegal drugs. Some items may interact with your medicine. What should I watch for while using this medication? Your condition will be monitored carefully while you are receiving this medication. This medication may make you feel generally unwell. This is not uncommon, as chemotherapy can affect healthy cells as well as cancer cells. Report any side effects. Continue your course of treatment even though you feel ill unless your care team tells you to stop. In some cases, you may be given additional medications to help with side effects. Follow all directions for their use. This medication may increase your risk of getting an infection. Call your care team  for advice if you get a fever, chills, sore throat, or other symptoms of a cold or flu. Do not treat yourself. Try to avoid being around people who are sick. This medication may increase your risk to bruise or bleed. Call your care team if you notice any unusual bleeding. Be careful brushing or flossing your teeth or using a toothpick because you may get an infection or bleed more easily. If you have any dental work done, tell your dentist you are receiving this medication. Avoid taking medications that contain aspirin, acetaminophen, ibuprofen, naproxen, or ketoprofen unless instructed by your care team. These medications may hide a fever. Talk to your care team if you or your partner wish to become pregnant or think you might be pregnant. This medication can cause  serious birth defects if taken during pregnancy and for 6 months after the last dose. A negative pregnancy test is required before starting this medication. A reliable form of contraception is recommended while taking this medication and for 6 months after the last dose. Talk to your care team about effective forms of contraception. Do not father a child while taking this medication and for 3 months after the last dose. Use a condom while having sex during this time period. Do not breastfeed while taking this medication and for at least 1 week after the last dose. This medication may cause infertility. Talk to your care team if you are concerned about your fertility. What side effects may I notice from receiving this medication? Side effects that you should report to your care team as soon as possible: Allergic reactions--skin rash, itching, hives, swelling of the face, lips, tongue, or throat Capillary leak syndrome--stomach or muscle pain, unusual weakness or fatigue, feeling faint or lightheaded, decrease in the amount of urine, swelling of the ankles, hands, or feet, trouble breathing Infection--fever, chills, cough, sore throat, wounds that don't heal, pain or trouble when passing urine, general feeling of discomfort or being unwell Liver injury--right upper belly pain, loss of appetite, nausea, light-colored stool, dark yellow or brown urine, yellowing skin or eyes, unusual weakness or fatigue Low red blood cell level--unusual weakness or fatigue, dizziness, headache, trouble breathing Lung injury--shortness of breath or trouble breathing, cough, spitting up blood, chest pain, fever Stomach pain, bloody diarrhea, pale skin, unusual weakness or fatigue, decrease in the amount of urine, which may be signs of hemolytic uremic syndrome Sudden and severe headache, confusion, change in vision, seizures, which may be signs of posterior reversible encephalopathy syndrome (PRES) Unusual bruising or  bleeding Side effects that usually do not require medical attention (report to your care team if they continue or are bothersome): Diarrhea Drowsiness Hair loss Nausea Pain, redness, or swelling with sores inside the mouth or throat Vomiting This list may not describe all possible side effects. Call your doctor for medical advice about side effects. You may report side effects to FDA at 1-800-FDA-1088. Where should I keep my medication? This medication is given in a hospital or clinic. It will not be stored at home. NOTE: This sheet is a summary. It may not cover all possible information. If you have questions about this medicine, talk to your doctor, pharmacist, or health care provider.  2023 Elsevier/Gold Standard (2021-11-04 00:00:00)

## 2022-09-21 ENCOUNTER — Encounter: Payer: Self-pay | Admitting: Family Medicine

## 2022-09-21 ENCOUNTER — Ambulatory Visit (INDEPENDENT_AMBULATORY_CARE_PROVIDER_SITE_OTHER): Payer: BLUE CROSS/BLUE SHIELD | Admitting: Family Medicine

## 2022-09-21 VITALS — BP 107/82 | HR 107 | Temp 98.0°F | Ht 69.0 in | Wt 157.0 lb

## 2022-09-21 DIAGNOSIS — I2699 Other pulmonary embolism without acute cor pulmonale: Secondary | ICD-10-CM | POA: Diagnosis not present

## 2022-09-21 DIAGNOSIS — I824Y1 Acute embolism and thrombosis of unspecified deep veins of right proximal lower extremity: Secondary | ICD-10-CM | POA: Diagnosis not present

## 2022-09-21 DIAGNOSIS — I10 Essential (primary) hypertension: Secondary | ICD-10-CM | POA: Diagnosis not present

## 2022-09-21 DIAGNOSIS — C642 Malignant neoplasm of left kidney, except renal pelvis: Secondary | ICD-10-CM

## 2022-09-21 NOTE — Progress Notes (Signed)
Established patient visit   Patient: Jeffrey Fernandez   DOB: [birth year]   67 y.o. Male  MRN: [id number] Visit Date: 09/21/2022  Today's healthcare provider: Lelon Huh, MD    Subjective    HPI Patient presents for follow up hospital admission for extensive right LE DVT with right lower lobe pulmonary emboli requiring thrombectomy by Dr. Delana Meyer.  He states he was having severe lower abdominal and lower extremity pain prior to thrombectomy. Was discharged on Eliquis and denies any abnormal bleeding or bruising.  He does report blood pressure had been very low and stopped losartan-hctz during hospitalization, continues on metoprolol. Home BP has been mostly in the 130s/70-80s since then.   Medications: Outpatient Medications Prior to Visit  Medication Sig Note   acetaminophen (TYLENOL) 500 MG tablet Take 500 mg by mouth every 6 (six) hours as needed.    apixaban (ELIQUIS) 5 MG TABS tablet Take 2 tablets (10 mg total) by mouth 2 (two) times daily.    apixaban (ELIQUIS) 5 MG TABS tablet Take 1 tablet (5 mg total) by mouth 2 (two) times daily.    docusate sodium (COLACE) 250 MG capsule Take 250 mg by mouth daily.    fentaNYL (DURAGESIC) 25 MCG/HR Place 1 patch onto the skin every 3 (three) days.    HYDROcodone-acetaminophen (NORCO) 5-325 MG tablet Take 1 tablet by mouth every 6 (six) hours as needed for moderate pain.    lidocaine-prilocaine (EMLA) cream Apply to affected area once    metoprolol succinate (TOPROL-XL) 25 MG 24 hr tablet TAKE 1 TABLET (25 MG TOTAL) BY MOUTH DAILY.    ondansetron (ZOFRAN) 8 MG tablet Take 1 tablet (8 mg total) by mouth every 8 (eight) hours as needed for nausea or vomiting.    ondansetron (ZOFRAN-ODT) 8 MG disintegrating tablet Take 8 mg by mouth every 8 (eight) hours as needed.    pantoprazole (PROTONIX) 40 MG tablet TAKE 1 TABLET BY MOUTH EVERY DAY    prochlorperazine (COMPAZINE) 10 MG tablet Take 1 tablet (10 mg total) by mouth every 6 (six) hours as  needed (Nausea or vomiting).    sildenafil (REVATIO) 20 MG tablet Take 1-3 tablets (20-60 mg total) by mouth daily as needed.    tamsulosin (FLOMAX) 0.4 MG CAPS capsule Take 1 capsule (0.4 mg total) by mouth daily.    traZODone (DESYREL) 50 MG tablet Take 0.5 tablets (25 mg total) by mouth at bedtime as needed for sleep.    [DISCONTINUED] aspirin 81 MG tablet Take 81 mg by mouth daily. 09/21/2022: stopped when put on Eliquis   chlorproMAZINE (THORAZINE) 10 MG tablet Take 1 tablet (10 mg total) by mouth 3 (three) times daily as needed for hiccoughs or nausea. Do not take when taking Zofran/ondansetron. (Patient not taking: Reported on 09/16/2022)    [DISCONTINUED] losartan-hydrochlorothiazide (HYZAAR) 100-12.5 MG tablet TAKE 1 TABLET BY MOUTH EVERY DAY (Patient not taking: Reported on 09/16/2022) 09/21/2022: BP has been low   No facility-administered medications prior to visit.    Review of Systems  Constitutional:  Positive for appetite change and fatigue. Negative for chills and fever.  Respiratory:  Negative for chest tightness, shortness of breath and wheezing.   Cardiovascular:  Negative for chest pain and palpitations.  Gastrointestinal:  Positive for nausea. Negative for abdominal pain and vomiting.       Objective    BP 107/82 (BP Location: Left Arm, Patient Position: Sitting, Cuff Size: Normal)   Pulse (!) 107   Temp  78 F (36.7 C)   Ht '5\' 9"'$  (1.753 m)   Wt 157 lb (71.2 kg)   SpO2 97%   BMI 23.18 kg/m    Physical Exam   General: Appearance:    Well developed, well nourished male in no acute distress  Eyes:    PERRL, conjunctiva/corneas clear, EOM's intact       Lungs:     Clear to auscultation bilaterally, respirations unlabored  Heart:    Tachycardic. Normal rhythm. No murmurs, rubs, or gallops.    MS:   All extremities are intact.    Neurologic:   Awake, alert, oriented x 3. No apparent focal neurological defect.         Assessment & Plan     1. Essential (primary)  hypertension Now off of losartan-hctz since being on chemo and recent hospitalization for DVT and PE. Keep on hold for now. Continue metoprolol, call if home Bps exceed 160/100, otherwise follow up in a month to recheck.   2. Acute deep vein thrombosis (DVT) of proximal vein of right lower extremity (HCC) Much better after thrombectomy per Dr. Delana Meyer, follow up vascular as scheduled.   3. Other acute pulmonary embolism without acute cor pulmonale (HCC) Continue DOAC.   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         Lelon Huh, MD  [facility] [phone] (phone) [phone] (fax)  [facility]

## 2022-09-21 NOTE — Telephone Encounter (Signed)
Called patients wife, Sonia Baller to enquire about the financial videos as to if they had been viewed yet. Message left for her and the patient to please watch as soon as possible. Asked her to call me at her earliest convenience to verify the date they watched the videos for the protocol.  Jeral Fruit, RN 09/21/22 2:09 PM

## 2022-09-23 ENCOUNTER — Other Ambulatory Visit: Payer: Self-pay | Admitting: *Deleted

## 2022-09-23 DIAGNOSIS — C641 Malignant neoplasm of right kidney, except renal pelvis: Secondary | ICD-10-CM

## 2022-09-24 ENCOUNTER — Inpatient Hospital Stay: Payer: BLUE CROSS/BLUE SHIELD

## 2022-09-24 ENCOUNTER — Inpatient Hospital Stay (HOSPITAL_BASED_OUTPATIENT_CLINIC_OR_DEPARTMENT_OTHER): Payer: BLUE CROSS/BLUE SHIELD | Admitting: Oncology

## 2022-09-24 ENCOUNTER — Encounter: Payer: Self-pay | Admitting: Oncology

## 2022-09-24 VITALS — BP 155/81 | HR 123 | Temp 97.6°F | Resp 18 | Ht 69.0 in | Wt 152.0 lb

## 2022-09-24 DIAGNOSIS — E876 Hypokalemia: Secondary | ICD-10-CM

## 2022-09-24 DIAGNOSIS — C641 Malignant neoplasm of right kidney, except renal pelvis: Secondary | ICD-10-CM

## 2022-09-24 DIAGNOSIS — Z5111 Encounter for antineoplastic chemotherapy: Secondary | ICD-10-CM | POA: Diagnosis not present

## 2022-09-24 DIAGNOSIS — Z95828 Presence of other vascular implants and grafts: Secondary | ICD-10-CM

## 2022-09-24 LAB — CMP (CANCER CENTER ONLY)
ALT: 34 U/L (ref 0–44)
AST: 30 U/L (ref 15–41)
Albumin: 3 g/dL — ABNORMAL LOW (ref 3.5–5.0)
Alkaline Phosphatase: 150 U/L — ABNORMAL HIGH (ref 38–126)
Anion gap: 9 (ref 5–15)
BUN: 21 mg/dL (ref 8–23)
CO2: 26 mmol/L (ref 22–32)
Calcium: 8.9 mg/dL (ref 8.9–10.3)
Chloride: 90 mmol/L — ABNORMAL LOW (ref 98–111)
Creatinine: 1.36 mg/dL — ABNORMAL HIGH (ref 0.61–1.24)
GFR, Estimated: 58 mL/min — ABNORMAL LOW (ref >60)
Glucose, Bld: 174 mg/dL — ABNORMAL HIGH (ref 70–99)
Potassium: 3.2 mmol/L — ABNORMAL LOW (ref 3.5–5.1)
Sodium: 125 mmol/L — ABNORMAL LOW (ref 135–145)
Total Bilirubin: 0.4 mg/dL (ref 0.3–1.2)
Total Protein: 7.4 g/dL (ref 6.5–8.1)

## 2022-09-24 LAB — MAGNESIUM: Magnesium: 2 mg/dL (ref 1.7–2.4)

## 2022-09-24 LAB — CBC WITH DIFFERENTIAL/PLATELET
Abs Immature Granulocytes: 0.28 10*3/uL — ABNORMAL HIGH (ref 0.00–0.07)
Basophils Absolute: 0.1 10*3/uL (ref 0.0–0.1)
Basophils Relative: 1 %
Eosinophils Absolute: 0.1 10*3/uL (ref 0.0–0.5)
Eosinophils Relative: 1 %
HCT: 26.4 % — ABNORMAL LOW (ref 39.0–52.0)
Hemoglobin: 9.1 g/dL — ABNORMAL LOW (ref 13.0–17.0)
Immature Granulocytes: 2 %
Lymphocytes Relative: 9 %
Lymphs Abs: 1.2 10*3/uL (ref 0.7–4.0)
MCH: 31 pg (ref 26.0–34.0)
MCHC: 34.5 g/dL (ref 30.0–36.0)
MCV: 89.8 fL (ref 80.0–100.0)
Monocytes Absolute: 1.2 10*3/uL — ABNORMAL HIGH (ref 0.1–1.0)
Monocytes Relative: 9 %
Neutro Abs: 10.4 10*3/uL — ABNORMAL HIGH (ref 1.7–7.7)
Neutrophils Relative %: 78 %
Platelets: 375 10*3/uL (ref 150–400)
RBC: 2.94 MIL/uL — ABNORMAL LOW (ref 4.22–5.81)
RDW: 13.5 % (ref 11.5–15.5)
WBC: 13.2 10*3/uL — ABNORMAL HIGH (ref 4.0–10.5)
nRBC: 0 % (ref 0.0–0.2)

## 2022-09-24 LAB — SAMPLE TO BLOOD BANK

## 2022-09-24 MED ORDER — HEPARIN SOD (PORK) LOCK FLUSH 100 UNIT/ML IV SOLN
500.0000 [IU] | Freq: Once | INTRAVENOUS | Status: AC
  Administered 2022-09-24: 500 [IU] via INTRAVENOUS
  Filled 2022-09-24: qty 5

## 2022-09-24 MED ORDER — SODIUM CHLORIDE 0.9 % IV SOLN
Freq: Once | INTRAVENOUS | Status: AC
  Filled 2022-09-24: qty 250

## 2022-09-24 MED ORDER — POTASSIUM CHLORIDE 20 MEQ/100ML IV SOLN
20.0000 meq | Freq: Once | INTRAVENOUS | Status: AC
  Administered 2022-09-24: 20 meq via INTRAVENOUS

## 2022-09-24 NOTE — Patient Instructions (Signed)
Dehydration, Adult Dehydration is a condition in which there is not enough water or other fluids in the body. This happens when a person loses more fluids than they take in. Important organs cannot work right without the right amount of fluids. Any loss of fluids from the body can cause dehydration. Dehydration can be mild, worse, or very bad. It should be treated right away to keep it from getting very bad. What are the causes? Conditions that cause loss of water in the body. They include: Watery poop (diarrhea). Vomiting. Sweating a lot. Fever. Infection. Peeing (urinating) a lot. Not drinking enough fluids. Certain medicines, such as medicines that take extra fluid out of the body (diuretics). Lack of safe drinking water. Not being able to get enough water and food. What increases the risk? Having a long-term (chronic) illness that has not been treated the right way, such as: Diabetes. Heart disease. Kidney disease. Being 65 years of age or older. Having a disability. Living in a place that is high above the ground or sea (high in altitude). The thinner, drier air causes more fluid loss. Doing exercises that put stress on your body for a long time. Being active when in hot places. What are the signs or symptoms? Symptoms of dehydration depend on how bad it is. Mild or worse dehydration Thirst. Dry lips or dry mouth. Feeling dizzy or light-headed. Muscle cramps. Passing little pee or dark pee. Pee may be the color of tea. Headache. Very bad dehydration Changes in skin. Skin may: Be cold to the touch (clammy). Be blotchy or pale. Not go back to normal right after you pinch it and let it go. Little or no tears, pee, or sweat. Fast breathing. Low blood pressure. Weak pulse. Pulse that is more than 100 beats a minute when you are sitting still. Other changes, such as: Feeling very thirsty. Eyes that look hollow (sunken). Cold hands and feet. Being confused. Being very  tired (lethargic) or having trouble waking from sleep. Losing weight. Loss of consciousness. How is this treated? Treatment for this condition depends on how bad your dehydration is. Treatment should start right away. Do not wait until your condition gets very bad. Very bad dehydration is an emergency. You will need to go to a hospital. Mild or worse dehydration can be treated at home. You may be asked to: Drink more fluids. Drink an oral rehydration solution (ORS). This drink gives you the right amount of fluids, salts, and minerals (electrolytes). Very bad dehydration can be treated: With fluids through an IV tube. By correcting low levels of electrolytes in the body. By treating the problem that caused your dehydration. Follow these instructions at home: Oral rehydration solution If told by your doctor, drink an ORS: Make an ORS. Use instructions on the package. Start by drinking small amounts, about  cup (120 mL) every 5-10 minutes. Slowly drink more until you have had the amount that your doctor said to have.  Eating and drinking  Drink enough clear fluid to keep your pee pale yellow. If you were told to drink an ORS, finish the ORS first. Then, start slowly drinking other clear fluids. Drink fluids such as: Water. Do not drink only water. Doing that can make the salt (sodium) level in your body get too low. Water from ice chips you suck on. Fruit juice that you have added water to (diluted). Low-calorie sports drinks. Eat foods that have the right amounts of salts and minerals, such as bananas, oranges, potatoes,   tomatoes, or spinach. Do not drink alcohol. Avoid drinks that have caffeine or sugar. These include:: High-calorie sports drinks. Fruit juice that you did not add water to. Soda. Coffee or energy drinks. Avoid foods that are greasy or have a lot of fat or sugar. General instructions Take over-the-counter and prescription medicines only as told by your doctor. Do  not take sodium tablets. Doing that can make the salt level in your body get too high. Return to your normal activities as told by your doctor. Ask your doctor what activities are safe for you. Keep all follow-up visits. Your doctor may check and change your treatment. Contact a doctor if: You have pain in your belly (abdomen) and the pain: Gets worse. Stays in one place. You have a rash. You have a stiff neck. You get angry or annoyed more easily than normal. You are more tired or have a harder time waking than normal. You feel weak or dizzy. You feel very thirsty. Get help right away if: You have any symptoms of very bad dehydration. You vomit every time you eat or drink. Your vomiting gets worse, does not go away, or you vomit blood or green stuff. You are getting treatment, but symptoms are getting worse. You have a fever. You have a very bad headache. You have: Diarrhea that gets worse or does not go away. Blood in your poop (stool). This may cause poop to look black and tarry. No pee in 6-8 hours. Only a small amount of pee in 6-8 hours, and the pee is very dark. You have trouble breathing. These symptoms may be an emergency. Get help right away. Call 911. Do not wait to see if the symptoms will go away. Do not drive yourself to the hospital. This information is not intended to replace advice given to you by your health care provider. Make sure you discuss any questions you have with your health care provider. Document Revised: 01/26/2022 Document Reviewed: 01/26/2022 [employer] Patient Education  2023 [employer]  

## 2022-09-24 NOTE — Progress Notes (Signed)
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  INTERVAL HISTORY: Patient returns to clinic today for further evaluation and consideration of cycle 2, day 8 of cisplatin and gemcitabine.  Gemcitabine only.  Despite dose reducing his chemotherapy 20%, patient continues to have poor appetite, chronic weakness and fatigue, and dehydration. He does not complain of pain today. He has no neurologic complaints.  He denies any recent fevers or illnesses.  He has no chest pain, shortness of breath, cough, or hemoptysis.  He denies any nausea, vomiting, or constipation.  He has no urinary complaints.  Patient feels generally terrible, but offers no further specific complaints today.  REVIEW OF SYSTEMS:   Review of Systems  Constitutional:  Positive for malaise/fatigue. Negative for fever and weight loss.  Respiratory: Negative.  Negative for cough, hemoptysis and shortness of breath.   Cardiovascular:  Positive for leg swelling. Negative for chest pain.  Gastrointestinal: Negative.  Negative for abdominal pain, diarrhea and nausea.  Genitourinary: Negative.  Negative for flank pain and hematuria.  Musculoskeletal:  Negative for back pain.  Skin: Negative.  Negative for rash.  Neurological:  Positive for dizziness and weakness. Negative for focal weakness and headaches.  Psychiatric/Behavioral: Negative.  The patient is not nervous/anxious.     As per HPI. Otherwise, a complete review of systems is negative.  PAST MEDICAL HISTORY: Past Medical History:  Diagnosis Date   Cancer [facility]) 2013   bladder   Diverticulitis    History of chickenpox     History of measles    History of mumps    Hyperlipidemia    Hypertension     PAST SURGICAL HISTORY: Past Surgical History:  Procedure Laterality Date   HERNIA REPAIR  2002,2003   IR IMAGING GUIDED PORT INSERTION  08/25/2022   IVC VENOGRAPHY N/A 09/11/2022   Procedure: IVC Venography;  Surgeon: Katha Cabal, MD;  Location: [place] [facility];  Service: Cardiovascular;  Laterality: N/A;   PERIPHERAL VASCULAR THROMBECTOMY Right 09/08/2022   Procedure: PERIPHERAL VASCULAR THROMBECTOMY;  Surgeon: Katha Cabal, MD;  Location: [place] [facility];  Service: Cardiovascular;  Laterality: Right;   SIGMOIDOSCOPY  1998   [facility]   TRANSURETHRAL RESECTION OF BLADDER TUMOR  05/11/2012   Dr. Sherral Hammers, [place]  2002   Bilateral inguinal herniorrhaphies    FAMILY HISTORY: Family History  Problem Relation Age of Onset   COPD Mother    Diverticulosis Mother    Diverticulosis Father    Diabetes Father    Hypertension Father    Diverticulosis Sister    Stroke Paternal Grandmother     ADVANCED DIRECTIVES (Y/N):  N  HEALTH MAINTENANCE: Social History   Tobacco Use   Smoking status: Former    Packs/day: 2.00    Years: 39.00    Additional pack years: 0.00    Total pack years: 78.00    Types: Cigarettes    Quit date: 07/14/2011    Years since quitting: 11.2    Passive exposure: Past   Smokeless tobacco: Never   Tobacco comments:    smoked 1 to 1 1/2  ppd for 35 years  Vaping Use   Vaping  Use: Never used  Substance Use Topics   Alcohol use: Yes    Comment: drinks 2 beers daily: none since 1 week ago   Drug use: No     Colonoscopy:  PAP:  Bone density:  Lipid panel:  Allergies  Allergen Reactions   Amoxicillin Itching   Atenolol Diarrhea    Other reaction(s): HIVES   Other Nausea And Vomiting    Nav[employer] Beans   Oxycodone     Other reaction(s): HIVES   Shellfish Allergy Itching and Swelling    Current Outpatient Medications  Medication  Sig Dispense Refill   acetaminophen (TYLENOL) 500 MG tablet Take 500 mg by mouth every 6 (six) hours as needed.     apixaban (ELIQUIS) 5 MG TABS tablet Take 2 tablets (10 mg total) by mouth 2 (two) times daily. 28 tablet 0   apixaban (ELIQUIS) 5 MG TABS tablet Take 1 tablet (5 mg total) by mouth 2 (two) times daily. 360 tablet 0   docusate sodium (COLACE) 250 MG capsule Take 250 mg by mouth daily.     fentaNYL (DURAGESIC) 25 MCG/HR Place 1 patch onto the skin every 3 (three) days. 10 patch 0   HYDROcodone-acetaminophen (NORCO) 5-325 MG tablet Take 1 tablet by mouth every 6 (six) hours as needed for moderate pain. 120 tablet 0   lidocaine-prilocaine (EMLA) cream Apply to affected area once 30 g 3   metoprolol succinate (TOPROL-XL) 25 MG 24 hr tablet TAKE 1 TABLET (25 MG TOTAL) BY MOUTH DAILY. 90 tablet 2   ondansetron (ZOFRAN) 8 MG tablet Take 1 tablet (8 mg total) by mouth every 8 (eight) hours as needed for nausea or vomiting. 90 tablet 3   ondansetron (ZOFRAN-ODT) 8 MG disintegrating tablet Take 8 mg by mouth every 8 (eight) hours as needed.     pantoprazole (PROTONIX) 40 MG tablet TAKE 1 TABLET BY MOUTH EVERY DAY 90 tablet 2   prochlorperazine (COMPAZINE) 10 MG tablet Take 1 tablet (10 mg total) by mouth every 6 (six) hours as needed (Nausea or vomiting). 60 tablet 2   sildenafil (REVATIO) 20 MG tablet Take 1-3 tablets (20-60 mg total) by mouth daily as needed. 30 tablet 11   tamsulosin (FLOMAX) 0.4 MG CAPS capsule Take 1 capsule (0.4 mg total) by mouth daily. 30 capsule 11   traZODone (DESYREL) 50 MG tablet Take 0.5 tablets (25 mg total) by mouth at bedtime as needed for sleep. 30 tablet 0   chlorproMAZINE (THORAZINE) 10 MG tablet Take 1 tablet (10 mg total) by mouth 3 (three) times daily as needed for hiccoughs or nausea. Do not take when taking Zofran/ondansetron. (Patient not taking: Reported on 09/16/2022) 30 tablet 0   No current facility-administered medications for this visit.    Facility-Administered Medications Ordered in Other Visits  Medication Dose Route Frequency Provider Last Rate Last Admin   0.9 %  sodium chloride infusion   Intravenous Once Lloyd Huger, MD 999 mL/hr at 09/24/22 1050 New Bag at 09/24/22 1050   potassium chloride 20 mEq in 100 mL IVPB  20 mEq Intravenous Once Lloyd Huger, MD 100 mL/hr at 09/24/22 1052 20 mEq at 09/24/22 1052    OBJECTIVE: Vitals:   09/24/22 1001  BP: (!) 155/81  Pulse: (!) 123  Resp: 18  Temp: 97.6 F (36.4 C)  SpO2: 100%     Body mass index is 22.45 kg/m.    ECOG FS:2 - Symptomatic, <50% confined to bed  General: Well-developed, well-nourished, no acute distress.  Sitting in a wheelchair. Eyes: Pink conjunctiva, anicteric sclera. HEENT: Normocephalic, moist mucous membranes. Lungs: No audible wheezing or coughing. Heart: Regular rate and rhythm. Abdomen: Soft, nontender, no obvious distention. Musculoskeletal: No edema, cyanosis, or clubbing. Neuro: Alert, answering all questions appropriately. Cranial nerves grossly intact. Skin: No rashes or petechiae noted. Psych: Normal affect.   LAB RESULTS:  Lab Results  Component Value Date   NA 125 (L) 09/24/2022   K 3.2 (L) 09/24/2022   CL 90 (L) 09/24/2022   CO2 26 09/24/2022   GLUCOSE 174 (H) 09/24/2022   BUN 21 09/24/2022   CREATININE 1.36 (H) 09/24/2022   CALCIUM 8.9 09/24/2022   PROT 7.4 09/24/2022   ALBUMIN 3.0 (L) 09/24/2022   AST 30 09/24/2022   ALT 34 09/24/2022   ALKPHOS 150 (H) 09/24/2022   BILITOT 0.4 09/24/2022   GFRNONAA 58 (L) 09/24/2022   GFRAA 90 06/10/2020    Lab Results  Component Value Date   WBC 13.2 (H) 09/24/2022   NEUTROABS 10.4 (H) 09/24/2022   HGB 9.1 (L) 09/24/2022   HCT 26.4 (L) 09/24/2022   MCV 89.8 09/24/2022   PLT 375 09/24/2022     STUDIES: PERIPHERAL VASCULAR CATHETERIZATION  Result Date: 09/11/2022 See surgical note for result.  ECHOCARDIOGRAM COMPLETE  Result Date: 09/10/2022     ECHOCARDIOGRAM REPORT   Patient Name:   ARPAD HALDER Date of Exam: 09/09/2022 Medical Rec #:  [id number]      Height:       69.0 in Accession #:    [id number]     Weight:       174.2 lb Date of Birth:  1956-06-14      BSA:          1.948 m Patient Age:    67 years       BP:           120/74 mmHg Patient Gender: M              HR:           97 bpm. Exam Location:  [facility] Procedure: 2D Echo, Cardiac Doppler and Color Doppler Indications:     R55 Syncope                  I26.09 Pulmonary Embolus  History:         Patient has no prior history of Echocardiogram examinations.                  Risk Factors:Hypertension and Dyslipidemia. Cancer.  Sonographer:     Cresenciano Lick RDCS Referring Phys:  [id number] [facility] Diagnosing Phys: Ida Rogue MD IMPRESSIONS  1. Left ventricular ejection fraction, by estimation, is 60 to 65%. The left ventricle has normal function. The left ventricle has no regional wall motion abnormalities. There is mild left ventricular hypertrophy. Left ventricular diastolic parameters are consistent with Grade I diastolic dysfunction (impaired relaxation).  2. Right ventricular systolic function is normal. The right ventricular size is normal. Tricuspid regurgitation signal is inadequate for assessing PA pressure.  3. The mitral valve is normal in structure. Mild mitral valve regurgitation. No evidence of mitral stenosis.  4. The aortic valve is normal in structure. Aortic valve regurgitation is not visualized. No aortic stenosis is present.  5. The inferior vena cava is normal in size with greater than 50% respiratory variability, suggesting right atrial pressure of 3 mmHg. FINDINGS  Left Ventricle: Left ventricular ejection fraction, by estimation, is 60 to  65%. The left ventricle has normal function. The left ventricle has no regional wall motion abnormalities. The left ventricular internal cavity size was normal in size. There is  mild left ventricular hypertrophy. Left  ventricular diastolic parameters are consistent with Grade I diastolic dysfunction (impaired relaxation). Right Ventricle: The right ventricular size is normal. No increase in right ventricular wall thickness. Right ventricular systolic function is normal. Tricuspid regurgitation signal is inadequate for assessing PA pressure. Left Atrium: Left atrial size was normal in size. Right Atrium: Right atrial size was normal in size. Pericardium: There is no evidence of pericardial effusion. Mitral Valve: The mitral valve is normal in structure. Mild mitral valve regurgitation. No evidence of mitral valve stenosis. Tricuspid Valve: The tricuspid valve is normal in structure. Tricuspid valve regurgitation is mild . No evidence of tricuspid stenosis. Aortic Valve: The aortic valve is normal in structure. Aortic valve regurgitation is not visualized. No aortic stenosis is present. Pulmonic Valve: The pulmonic valve was normal in structure. Pulmonic valve regurgitation is not visualized. No evidence of pulmonic stenosis. Aorta: The aortic root is normal in size and structure. Venous: The inferior vena cava is normal in size with greater than 50% respiratory variability, suggesting right atrial pressure of 3 mmHg. IAS/Shunts: No atrial level shunt detected by color flow Doppler.  LEFT VENTRICLE PLAX 2D LVIDd:         4.60 cm   Diastology LVIDs:         2.90 cm   LV e' medial:    8.16 cm/s LV PW:         0.80 cm   LV E/e' medial:  9.2 LV IVS:        0.80 cm   LV e' lateral:   10.95 cm/s LVOT diam:     2.10 cm   LV E/e' lateral: 6.9 LV SV:         50 LV SV Index:   26 LVOT Area:     3.46 cm  RIGHT VENTRICLE          IVC RV Basal diam:  3.40 cm  IVC diam: 1.40 cm RV Mid diam:    3.90 cm LEFT ATRIUM             Index        RIGHT ATRIUM           Index LA diam:        3.70 cm 1.90 cm/m   RA Area:     10.10 cm LA Vol (A2C):   39.9 ml 20.48 ml/m  RA Volume:   23.00 ml  11.81 ml/m LA Vol (A4C):   27.1 ml 13.91 ml/m LA Biplane  Vol: 35.8 ml 18.38 ml/m  AORTIC VALVE LVOT Vmax:   100.07 cm/s LVOT Vmean:  66.133 cm/s LVOT VTI:    0.145 m  AORTA Ao Root diam: 3.80 cm Ao Asc diam:  3.30 cm MITRAL VALVE MV Area (PHT): 4.96 cm    SHUNTS MV Decel Time: 153 msec    Systemic VTI:  0.15 m MV E velocity: 75.45 cm/s  Systemic Diam: 2.10 cm MV A velocity: 76.50 cm/s MV E/A ratio:  0.99 Ida Rogue MD Electronically signed by Ida Rogue MD Signature Date/Time: 09/10/2022/7:28:09 AM    Final    PERIPHERAL VASCULAR CATHETERIZATION  Result Date: 09/08/2022 See surgical note for result.  CT Angio Chest PE W and/or Wo Contrast  Result Date: 09/07/2022 CLINICAL DATA:  Stage IV ureteral carcinoma. Hypotension and  syncope. * Tracking Code: BO * EXAM: CT ANGIOGRAPHY CHEST WITH CONTRAST TECHNIQUE: Multidetector CT imaging of the chest was performed using the standard protocol during bolus administration of intravenous contrast. Multiplanar CT image reconstructions and MIPs were obtained to evaluate the vascular anatomy. RADIATION DOSE REDUCTION: This exam was performed according to the departmental dose-optimization program which includes automated exposure control, adjustment of the mA and/or kV according to patient size and/or use of iterative reconstruction technique. CONTRAST:  172m OMNIPAQUE IOHEXOL 350 MG/ML SOLN COMPARISON:  PET-CT 08/14/2022.  Chest CT 08/05/2022 noncontrast FINDINGS: Cardiovascular: Heart is nonenlarged. Trace pericardial fluid. Right upper chest port in place. The port is accessed. In the right lower lobe is a filling defect along the medial branch of the right lower lobe pulmonary artery consistent with a subsegmental pulmonary embolism. No other larger and more central emboli are identified. Mediastinum/Nodes: No specific abnormal lymph node enlargement identified in the axillary regions. There are several abnormal mediastinal and hilar lymph nodes once again identified. These are increased from the study of  08/05/2022. Example to the left of the main pulmonary artery on series 4, image 67 measures 2.3 x 1.7 cm. Right hilar node anteriorly on series 4, image 68 measures 15 by 12 mm. Left hilar node on image 80 of series 4 measures 16 by 11 mm. Several other nodes are identified. There are also some prominent nodes along the thoracic inlet on the left side. All of these are also larger than the PET-CT scan of 08/14/22. Normal caliber thoracic esophagus. Lungs/Pleura: No pneumothorax or consolidation. Trace right-sided pleural fluid. Once again there are numerous bilateral lung nodules of varying size but the vast majority are under a cm. At least 15 nodules are identified. There is 1 lesion along the right lower lobe on image 95 of series 6 which is cavitary measuring 2.3 by 1.9 cm. Example nodule in the left lung, lower lobe on series 6, image 97 measures 12 by 10 mm. All of these are increased in size and number. Upper Abdomen: There are multiple perinephric space nodules involving the right kidney as well as poor visualization of the parenchyma of the right kidney consistent with known mass lesions. There is also nodular enlargement of the adrenal glands which also appears to have increased. Musculoskeletal: Mild degenerative changes along the spine. Review of the MIP images confirms the above findings. Critical Value/emergent results were called by telephone at the time of interpretation on 09/07/2022 at 2:07 pm to provider Dr. SJoni Fears who verbally acknowledged these results. IMPRESSION: Progression of metastatic disease. This includes lung nodules, mediastinal lymph nodes and upper abdominal lesions. Right lower lobe pulmonary emboli. Please see separate dictation of abdomen and pelvis CT from same day Electronically Signed   By: AJill SideM.D.   On: 09/07/2022 17:21   CT ABDOMEN PELVIS W CONTRAST  Result Date: 09/07/2022 CLINICAL DATA:  Epigastric pain again leg swelling. Undergoing chemotherapy for stage IV  urethral carcinoma. * Tracking Code: BO * EXAM: CT ABDOMEN AND PELVIS WITH CONTRAST TECHNIQUE: Multidetector CT imaging of the abdomen and pelvis was performed using the standard protocol following bolus administration of intravenous contrast. RADIATION DOSE REDUCTION: This exam was performed according to the departmental dose-optimization program which includes automated exposure control, adjustment of the mA and/or kV according to patient size and/or use of iterative reconstruction technique. CONTRAST:  1051mOMNIPAQUE IOHEXOL 350 MG/ML SOLN COMPARISON:  PET-CT 08/14/2022.  Standard CT 07/31/2022 FINDINGS: Lower chest: Please see separate dictation of chest CT  angiogram from same date Hepatobiliary: Liver metastases are noted. Central lesion in the right hepatic lobe with some peripheral segmental biliary duct ectasia is now identified on series 2, image 15 measuring 2.2 x 1.6 cm. The duct dilatation is likely related to secondary mass effect. Small lesions are also seen peripherally in the right hepatic lobe on image 17 and 21 in segment 4. Portal vein is patent. Gallbladder is nondilated. Pancreas: Unremarkable. No pancreatic ductal dilatation or surrounding inflammatory changes. Spleen: Normal in size without focal abnormality. Adrenals/Urinary Tract: Increasing left adrenal nodule. On image 26 of series 2 this measures 17 x 15 mm. There is also an increasing right adrenal metastasis. Left kidney has some small Bosniak 1 cysts, unchanged from prior. No left-sided renal collecting system dilatation. Left ureter has a normal course and caliber down to the bladder. Preserved contours of the urinary bladder. There is large infiltrative mass involving the right kidney along its posterior aspect. Previously this measured 6.0 x 4.6 cm and on the current examination 6.5 by 4.4 cm. Once again there is ill-defined margins extending into the renal hilum collecting system. In addition there is marked increase in the number  and extent of perinephric space nodules diffusely. Example superiorly on series 28 of series 2 measuring 15 by 14 mm. Previously this measured less than 5 mm. There is significant retroperitoneal stranding identified which is significantly increased from prior. Stomach/Bowel: Large bowel has a normal course and caliber. Scattered diverticula. No smaller large bowel dilatation. The stomach is underdistended. Vascular/Lymphatic: Normal caliber aorta with scattered vascular calcifications. There is significant increase in retroperitoneal abnormal lymph nodes. Example retrocaval on series 2 image 37 measures 4.7 x 2.7 cm. On the patient's prior PET-CT this measured 4.0 x 2.2 cm. There is several others which have increased as well and now cause significant compression of the IVC. Another example is a left para-aortic node which previously would have had dimensions of 11 mm in transverse and today 14 mm on series 2, image 36. Of note caudal to the area of narrowing of the IVC the veins are distended all the way into the area of the lower extremities. There is also edema of the right lower extremity. Please correlate with separate ultrasound of the legs for DVT from same day. Of note, 1 of the retrocaval mass lesions identified does appear to cause deformity of the adjacent vertebral body at L2 and extend into the vertebral body itself. Reproductive: Enlarged prostate with mass effect along the base of the bladder. Other: Anasarca.  Nonspecific presacral stranding. Musculoskeletal: Degenerative changes of the spine and pelvis. Pars defects again L5. Once again there is invasion of a retroperitoneal node into the margin of the vertebral body at L2 best seen on sagittal image 54 and axial image 38. Again new from previous examinations. Critical Value/emergent results were called by telephone at the time of interpretation on 09/07/2022 at 2:19 pm to provider Dr. Joni Fears, who verbally acknowledged these results. IMPRESSION:  Marked increase of neoplasm metastatic disease including size of the primary right renal mass. Significant increase in size number perinephric nodules, bilateral adrenal masses, retroperitoneal nodes and liver lesions. The retroperitoneal lymphadenopathy does cause severe narrowing of the IVC focally and associated distention of caudal veins including the pelvis. Please correlate with separate DVT ultrasound. In addition 1 of the retroperitoneal nodes does appear to extend directly into and invade the L2 vertebral body. No bowel obstruction or free air. Scattered anasarca including along the retroperitoneum and presacral regions.  Electronically Signed   By: Jill Side M.D.   On: 09/07/2022 17:21   US Venous Img Lower Right (DVT Study)  Result Date: 09/07/2022 CLINICAL DATA:  Acute right lower extremity pain and edema. EXAM: Right LOWER EXTREMITY VENOUS DOPPLER ULTRASOUND TECHNIQUE: Gray-scale sonography with graded compression, as well as color Doppler and duplex ultrasound were performed to evaluate the lower extremity deep venous systems from the level of the common femoral vein and including the common femoral, femoral, profunda femoral, popliteal and calf veins including the posterior tibial, peroneal and gastrocnemius veins when visible. The superficial great saphenous vein was also interrogated. Spectral Doppler was utilized to evaluate flow at rest and with distal augmentation maneuvers in the common femoral, femoral and popliteal veins. COMPARISON:  None Available. FINDINGS: Contralateral Common Femoral Vein: Respiratory phasicity is normal and symmetric with the symptomatic side. No evidence of thrombus. Normal compressibility. Common Femoral Vein: Noncompressible with no flow consistent with occlusive thrombus. Saphenofemoral Junction: Noncompressible with no flow consistent with occlusive thrombus. Profunda Femoral Vein: Noncompressible with no flow consistent with occlusive thrombus. Femoral Vein:  Only partially compressible with possible rouleaux flow present. Popliteal Vein: Only partially compressible with possible rouleaux low flow present. Calf Veins: No evidence of thrombus. Normal compressibility and flow on color Doppler imaging. Superficial Great Saphenous Vein: No evidence of thrombus. Normal compressibility. Venous Reflux:  None. Other Findings: There also appears to be noncompressible thrombus in the visualized portions of the right common and external iliac veins. IMPRESSION: Occlusive deep venous thrombosis is noted in the right common femoral and profundus femoral veins as well as the right saphenofemoral junction. The right superficial femoral and popliteal veins are only partially compressible with probable rouleaux flow present. There also appears to be noncompressible thrombus within the visualized portions of the right common and external iliac veins. Electronically Signed   By: Marijo Conception M.D.   On: 09/07/2022 15:46   DG Chest Port 1 View  Result Date: 09/07/2022 CLINICAL DATA:  Provided history: Questionable sepsis. Evaluate for abnormality. EXAM: PORTABLE CHEST 1 VIEW COMPARISON:  PET-CT 08/14/2022. FINDINGS: Right chest infusion port catheter with tip at the level of the superior cavoatrial junction. Heart size within normal limits. Numerous bilateral pulmonary nodules consistent with known metastatic disease (as demonstrated on the recent prior PET-CT of 08/14/2022). No appreciable superimposed acute airspace consolidation. No evidence of pleural effusion or pneumothorax. No acute bony abnormality identified. Levocurvature of the thoracic spine. IMPRESSION: Numerous bilateral pulmonary nodules consistent with known metastatic disease (as demonstrated on the recent prior PET-CT of 08/14/2022). No appreciable superimposed acute airspace consolidation. Electronically Signed   By: Kellie Simmering D.O.   On: 09/07/2022 14:23    ASSESSMENT: Stage IV urothelial carcinoma of the right  kidney.  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  This lesion was not evident on CT scan from June 19, 2020.  PET scan results from August 14, 2022 reviewed independently with hypermetabolic kidney mass, retroperitoneal lymphadenopathy and multiple bilateral pulmonary nodules consistent with metastatic disease.  Biopsy confirmed diagnosis.  Repeat CT scan while patient was admitted to the hospital on September 07, 2022 appears to reveal progression of disease, but unsure the clinical significance of this with patient only having 1 cycle of chemotherapy.  He is receiving systemic chemotherapy using cisplatin  and gemcitabine on day 1 with gemcitabine only on day 8.  This will be a 21-day cycle.  Both cisplatin and gemcitabine have been dose reduced 20%.  Patient has had port placement.  Delay cycle 2, day 8 of treatment today.  Patient will instead receive 2 L of IV fluids.  Return to clinic in 1 week for further evaluation and reconsideration of treatment with will be gemcitabine only.  Pain: Patient does not complain of this today.  Continue fentanyl patch and Norco as needed. Nausea: Continue antiemetics as needed. Diarrhea: Resolved.   Hyponatremia: Worse today.  Patient sodium is 125.   Renal insufficiency: Creatinine is trended up to 1.36.  Proceed with 2 L of fluid as above.  Patient will also return to clinic on Monday for additional IV fluids. Leukocytosis: Chronic and unchanged, monitor. Anemia: Patient's hemoglobin is trended up to 9.1, although suspect there is a degree of hemoconcentration.  Monitor.   Hypokalemia: Monitor.  Patient will receive 20 mEq IV potassium today as well.   DVT: Continue Eliquis as prescribed.  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  Follow-up with  vascular surgery as scheduled.   Patient expressed understanding and was in agreement with this plan. He also understands that He can call clinic at any time with any questions, concerns, or complaints.    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

## 2022-09-24 NOTE — Progress Notes (Signed)
Feeling dehydrated, weak, dizziness, and  very tired. Overall not feeling well today.

## 2022-09-25 NOTE — Progress Notes (Unsigned)
MRN : [id number]  Jeffrey Fernandez is a 67 y.o. (August 15, 1955) male who presents with chief complaint of legs hurt and swell.  History of Present Illness:   The patient presents to the office for follow-up evaluation status post bilateral iliac venous thrombectomy.  Thrombectomy was performed at at [facility] on 09/11/2022.   Procedure: Mechanical thrombectomy to using the penumbra CAT right common femoral, external iliac, common iliac veins and the left common iliac vein as well as the inferior vena cava 5.   Percutaneous transluminal angioplasty and stent placement of the inferior vena cava using a 20 mm x 60 mm Venovo and a 20 mm x 40 mm Venovo 6.   Percutaneous transluminal angioplasty and stent placement of the common iliac veins using a 14 mm x 60 mm Venovo  stent on the right and a 14 mm x 80 mm Venovo stent on the left.  The initial symptoms were pain and swelling in the lower extremity.  The patient notes the leg is much improved although there is still some pain and swelling with prolonged dependency.  Symptoms are much better with elevation.  The patient notes minimal edema in the morning.    The patient has not been using compression therapy at this point.  The patient is on anticoagulation.  No SOB or pleuritic chest pains.  No cough or hemoptysis.  No blood per rectum or blood in any sputum.  No excessive bruising per the patient.   No recent shortening of the patient's walking distance or new symptoms consistent with claudication.  No history of rest pain symptoms. No new ulcers or wounds of the lower extremities have occurred.  The patient denies amaurosis fugax or recent TIA symptoms. There are no recent neurological changes noted. No recent episodes of angina or shortness of breath documented.   No outpatient medications have been marked as taking for the 09/28/22 encounter (Appointment) with Delana Meyer, Dolores Lory, MD.    Past Medical History:  Diagnosis Date    Cancer [facility]) 2013   bladder   Diverticulitis    History of chickenpox    History of measles    History of mumps    Hyperlipidemia    Hypertension     Past Surgical History:  Procedure Laterality Date   HERNIA REPAIR  2002,2003   IR IMAGING GUIDED PORT INSERTION  08/25/2022   IVC VENOGRAPHY N/A 09/11/2022   Procedure: IVC Venography;  Surgeon: Katha Cabal, MD;  Location: [facility] [facility];  Service: Cardiovascular;  Laterality: N/A;   PERIPHERAL VASCULAR THROMBECTOMY Right 09/08/2022   Procedure: PERIPHERAL VASCULAR THROMBECTOMY;  Surgeon: Katha Cabal, MD;  Location: [place] [facility];  Service: Cardiovascular;  Laterality: Right;   SIGMOIDOSCOPY  1998   [facility]   TRANSURETHRAL RESECTION OF BLADDER TUMOR  05/11/2012   Dr. Sherral Hammers, [facility]   VASECTOMY  2002   Bilateral inguinal herniorrhaphies    Social History Social History   Tobacco Use   Smoking status: Former    Packs/day: 2.00    Years: 39.00    Additional pack years: 0.00    Total pack years: 78.00    Types: Cigarettes    Quit date: 07/14/2011    Years since quitting: 11.2    Passive exposure: Past   Smokeless tobacco: Never   Tobacco comments:    smoked 1 to 1 1/2  ppd for 35 years  Vaping Use  Vaping Use: Never used  Substance Use Topics   Alcohol use: Yes    Comment: drinks 2 beers daily: none since 1 week ago   Drug use: No    Family History Family History  Problem Relation Age of Onset   COPD Mother    Diverticulosis Mother    Diverticulosis Father    Diabetes Father    Hypertension Father    Diverticulosis Sister    Stroke Paternal Grandmother     Allergies  Allergen Reactions   Amoxicillin Itching   Atenolol Diarrhea    Other reaction(s): HIVES   Other Nausea And Vomiting    Navy Beans   Oxycodone     Other reaction(s): HIVES   Shellfish Allergy Itching and Swelling     REVIEW OF SYSTEMS (Negative unless checked)  Constitutional: [] Weight loss   [] Fever  [] Chills Cardiac: [] Chest pain   [] Chest pressure   [] Palpitations   [] Shortness of breath when laying flat   [] Shortness of breath with exertion. Vascular:  [] Pain in legs with walking   [x] Pain in legs at rest  [] History of DVT   [] Phlebitis   [x] Swelling in legs   [] Varicose veins   [] Non-healing ulcers Pulmonary:   [] Uses home oxygen   [] Productive cough   [] Hemoptysis   [] Wheeze  [x] COPD   [] Asthma Neurologic:  [] Dizziness   [] Seizures   [] History of stroke   [] History of TIA  [] Aphasia   [] Vissual changes   [] Weakness or numbness in arm   [] Weakness or numbness in leg Musculoskeletal:   [] Joint swelling   [] Joint pain   [] Low back pain Hematologic:  [] Easy bruising  [] Easy bleeding   [] Hypercoagulable state   [] Anemic Gastrointestinal:  [] Diarrhea   [] Vomiting  [x] Gastroesophageal reflux/heartburn   [] Difficulty swallowing. Genitourinary:  [] Chronic kidney disease   [] Difficult urination  [] Frequent urination   [] Blood in urine Skin:  [] Rashes   [] Ulcers  Psychological:  [] History of anxiety   []  History of major depression.  Physical Examination  There were no vitals filed for this visit. There is no height or weight on file to calculate BMI. Gen: WD/WN, NAD Head: [place]/AT, No temporalis wasting.  Ear/Nose/Throat: Hearing grossly intact, nares w/o erythema or drainage, pinna without lesions Eyes: PER, EOMI, sclera nonicteric.  Neck: Supple, no gross masses.  No JVD.  Pulmonary:  Good air movement, no audible wheezing, no use of accessory muscles.  Cardiac: RRR, precordium not hyperdynamic. Vascular:  scattered varicosities present bilaterally.  Moderate venous stasis changes to the legs bilaterally.  2+ soft pitting edema. CEAP [id number]   Vessel Right Left  Radial Palpable Palpable  Gastrointestinal: soft, non-distended. No guarding/no peritoneal signs.  Musculoskeletal: M/S 5/5 throughout.  No deformity.  Neurologic: CN 2-12 intact. Pain and light touch intact in  extremities.  Symmetrical.  Speech is fluent. Motor exam as listed above. Psychiatric: Judgment intact, Mood & affect appropriate for pt's clinical situation. Dermatologic: Venous rashes no ulcers noted.  No changes consistent with cellulitis. Lymph : No lichenification or skin changes of chronic lymphedema.  CBC Lab Results  Component Value Date   WBC 13.2 (H) 09/24/2022   HGB 9.1 (L) 09/24/2022   HCT 26.4 (L) 09/24/2022   MCV 89.8 09/24/2022   PLT 375 09/24/2022    BMET    Component Value Date/Time   NA 125 (L) 09/24/2022 0949   NA 142 12/02/2021 1332   K 3.2 (L) 09/24/2022 0949   CL 90 (L) 09/24/2022 0949   CO2 26  09/24/2022 0949   GLUCOSE 174 (H) 09/24/2022 0949   BUN 21 09/24/2022 0949   BUN 12 12/02/2021 1332   CREATININE 1.36 (H) 09/24/2022 0949   CALCIUM 8.9 09/24/2022 0949   GFRNONAA 58 (L) 09/24/2022 0949   GFRAA 90 06/10/2020 0855   Estimated Creatinine Clearance: 52.8 mL/min (A) (by C-G formula based on SCr of 1.36 mg/dL (H)).  COAG Lab Results  Component Value Date   INR 1.3 (H) 09/07/2022   INR 1.1 08/12/2022   INR 0.9 06/10/2020    Radiology PERIPHERAL VASCULAR CATHETERIZATION  Result Date: 09/11/2022 See surgical note for result.  ECHOCARDIOGRAM COMPLETE  Result Date: 09/10/2022    ECHOCARDIOGRAM REPORT   Patient Name:   Jeffrey Fernandez Date of Exam: 09/09/2022 Medical Rec #:  [id number]      Height:       69.0 in Accession #:    [id number]     Weight:       174.2 lb Date of Birth:  1956-04-10      BSA:          1.948 m Patient Age:    46 years       BP:           120/74 mmHg Patient Gender: M              HR:           97 bpm. Exam Location:  [facility] Procedure: 2D Echo, Cardiac Doppler and Color Doppler Indications:     R55 Syncope                  I26.09 Pulmonary Embolus  History:         Patient has no prior history of Echocardiogram examinations.                  Risk Factors:Hypertension and Dyslipidemia. Cancer.  Sonographer:     Cresenciano Lick  RDCS Referring Phys:  [id number] [place] [facility] Diagnosing Phys: Ida Rogue MD IMPRESSIONS  1. Left ventricular ejection fraction, by estimation, is 60 to 65%. The left ventricle has normal function. The left ventricle has no regional wall motion abnormalities. There is mild left ventricular hypertrophy. Left ventricular diastolic parameters are consistent with Grade I diastolic dysfunction (impaired relaxation).  2. Right ventricular systolic function is normal. The right ventricular size is normal. Tricuspid regurgitation signal is inadequate for assessing PA pressure.  3. The mitral valve is normal in structure. Mild mitral valve regurgitation. No evidence of mitral stenosis.  4. The aortic valve is normal in structure. Aortic valve regurgitation is not visualized. No aortic stenosis is present.  5. The inferior vena cava is normal in size with greater than 50% respiratory variability, suggesting right atrial pressure of 3 mmHg. FINDINGS  Left Ventricle: Left ventricular ejection fraction, by estimation, is 60 to 65%. The left ventricle has normal function. The left ventricle has no regional wall motion abnormalities. The left ventricular internal cavity size was normal in size. There is  mild left ventricular hypertrophy. Left ventricular diastolic parameters are consistent with Grade I diastolic dysfunction (impaired relaxation). Right Ventricle: The right ventricular size is normal. No increase in right ventricular wall thickness. Right ventricular systolic function is normal. Tricuspid regurgitation signal is inadequate for assessing PA pressure. Left Atrium: Left atrial size was normal in size. Right Atrium: Right atrial size was normal in size. Pericardium: There is no evidence of pericardial effusion. Mitral Valve: The mitral valve is normal in  structure. Mild mitral valve regurgitation. No evidence of mitral valve stenosis. Tricuspid Valve: The tricuspid valve is normal in structure. Tricuspid valve  regurgitation is mild . No evidence of tricuspid stenosis. Aortic Valve: The aortic valve is normal in structure. Aortic valve regurgitation is not visualized. No aortic stenosis is present. Pulmonic Valve: The pulmonic valve was normal in structure. Pulmonic valve regurgitation is not visualized. No evidence of pulmonic stenosis. Aorta: The aortic root is normal in size and structure. Venous: The inferior vena cava is normal in size with greater than 50% respiratory variability, suggesting right atrial pressure of 3 mmHg. IAS/Shunts: No atrial level shunt detected by color flow Doppler.  LEFT VENTRICLE PLAX 2D LVIDd:         4.60 cm   Diastology LVIDs:         2.90 cm   LV e' medial:    8.16 cm/s LV PW:         0.80 cm   LV E/e' medial:  9.2 LV IVS:        0.80 cm   LV e' lateral:   10.95 cm/s LVOT diam:     2.10 cm   LV E/e' lateral: 6.9 LV SV:         50 LV SV Index:   26 LVOT Area:     3.46 cm  RIGHT VENTRICLE          IVC RV Basal diam:  3.40 cm  IVC diam: 1.40 cm RV Mid diam:    3.90 cm LEFT ATRIUM             Index        RIGHT ATRIUM           Index LA diam:        3.70 cm 1.90 cm/m   RA Area:     10.10 cm LA Vol (A2C):   39.9 ml 20.48 ml/m  RA Volume:   23.00 ml  11.81 ml/m LA Vol (A4C):   27.1 ml 13.91 ml/m LA Biplane Vol: 35.8 ml 18.38 ml/m  AORTIC VALVE LVOT Vmax:   100.07 cm/s LVOT Vmean:  66.133 cm/s LVOT VTI:    0.145 m  AORTA Ao Root diam: 3.80 cm Ao Asc diam:  3.30 cm MITRAL VALVE MV Area (PHT): 4.96 cm    SHUNTS MV Decel Time: 153 msec    Systemic VTI:  0.15 m MV E velocity: 75.45 cm/s  Systemic Diam: 2.10 cm MV A velocity: 76.50 cm/s MV E/A ratio:  0.99 Ida Rogue MD Electronically signed by Ida Rogue MD Signature Date/Time: 09/10/2022/7:28:09 AM    Final    PERIPHERAL VASCULAR CATHETERIZATION  Result Date: 09/08/2022 See surgical note for result.  CT Angio Chest PE W and/or Wo Contrast  Result Date: 09/07/2022 CLINICAL DATA:  Stage IV ureteral carcinoma. Hypotension and  syncope. * Tracking Code: BO * EXAM: CT ANGIOGRAPHY CHEST WITH CONTRAST TECHNIQUE: Multidetector CT imaging of the chest was performed using the standard protocol during bolus administration of intravenous contrast. Multiplanar CT image reconstructions and MIPs were obtained to evaluate the vascular anatomy. RADIATION DOSE REDUCTION: This exam was performed according to the departmental dose-optimization program which includes automated exposure control, adjustment of the mA and/or kV according to patient size and/or use of iterative reconstruction technique. CONTRAST:  140mL OMNIPAQUE IOHEXOL 350 MG/ML SOLN COMPARISON:  PET-CT 08/14/2022.  Chest CT 08/05/2022 noncontrast FINDINGS: Cardiovascular: Heart is nonenlarged. Trace pericardial fluid. Right upper chest port in place. The  port is accessed. In the right lower lobe is a filling defect along the medial branch of the right lower lobe pulmonary artery consistent with a subsegmental pulmonary embolism. No other larger and more central emboli are identified. Mediastinum/Nodes: No specific abnormal lymph node enlargement identified in the axillary regions. There are several abnormal mediastinal and hilar lymph nodes once again identified. These are increased from the study of 08/05/2022. Example to the left of the main pulmonary artery on series 4, image 67 measures 2.3 x 1.7 cm. Right hilar node anteriorly on series 4, image 68 measures 15 by 12 mm. Left hilar node on image 80 of series 4 measures 16 by 11 mm. Several other nodes are identified. There are also some prominent nodes along the thoracic inlet on the left side. All of these are also larger than the PET-CT scan of 08/14/22. Normal caliber thoracic esophagus. Lungs/Pleura: No pneumothorax or consolidation. Trace right-sided pleural fluid. Once again there are numerous bilateral lung nodules of varying size but the vast majority are under a cm. At least 15 nodules are identified. There is 1 lesion along the  right lower lobe on image 95 of series 6 which is cavitary measuring 2.3 by 1.9 cm. Example nodule in the left lung, lower lobe on series 6, image 97 measures 12 by 10 mm. All of these are increased in size and number. Upper Abdomen: There are multiple perinephric space nodules involving the right kidney as well as poor visualization of the parenchyma of the right kidney consistent with known mass lesions. There is also nodular enlargement of the adrenal glands which also appears to have increased. Musculoskeletal: Mild degenerative changes along the spine. Review of the MIP images confirms the above findings. Critical Value/emergent results were called by telephone at the time of interpretation on 09/07/2022 at 2:07 pm to provider Dr. Joni Fears, who verbally acknowledged these results. IMPRESSION: Progression of metastatic disease. This includes lung nodules, mediastinal lymph nodes and upper abdominal lesions. Right lower lobe pulmonary emboli. Please see separate dictation of abdomen and pelvis CT from same day Electronically Signed   By: Jill Side M.D.   On: 09/07/2022 17:21   CT ABDOMEN PELVIS W CONTRAST  Result Date: 09/07/2022 CLINICAL DATA:  Epigastric pain again leg swelling. 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 * Tracking Code: BO * EXAM: CT ABDOMEN AND PELVIS WITH CONTRAST TECHNIQUE: Multidetector CT imaging of the abdomen and pelvis was performed using the standard protocol following bolus administration of intravenous contrast. RADIATION DOSE REDUCTION: This exam was performed according to the departmental dose-optimization program which includes automated exposure control, adjustment of the mA and/or kV according to patient size and/or use of iterative reconstruction technique. CONTRAST:  183mL OMNIPAQUE IOHEXOL 350 MG/ML SOLN COMPARISON:  PET-CT 08/14/2022.  Standard CT 07/31/2022 FINDINGS: Lower chest: Please see separate dictation of chest CT angiogram from same date  Hepatobiliary: Liver metastases are noted. Central lesion in the right hepatic lobe with some peripheral segmental biliary duct ectasia is now identified on series 2, image 15 measuring 2.2 x 1.6 cm. The duct dilatation is likely related to secondary mass effect. Small lesions are also seen peripherally in the right hepatic lobe on image 17 and 21 in segment 4. Portal vein is patent. Gallbladder is nondilated. Pancreas: Unremarkable. No pancreatic ductal dilatation or surrounding inflammatory changes. Spleen: Normal in size without focal abnormality. Adrenals/Urinary Tract: Increasing left adrenal nodule. On image 26 of series 2 this measures 17 x 15 mm. There is also  an increasing right adrenal metastasis. Left kidney has some small Bosniak 1 cysts, unchanged from prior. No left-sided renal collecting system dilatation. Left ureter has a normal course and caliber down to the bladder. Preserved contours of the urinary bladder. There is large infiltrative mass involving the right kidney along its posterior aspect. Previously this measured 6.0 x 4.6 cm and on the current examination 6.5 by 4.4 cm. Once again there is ill-defined margins extending into the renal hilum collecting system. In addition there is marked increase in the number and extent of perinephric space nodules diffusely. Example superiorly on series 28 of series 2 measuring 15 by 14 mm. Previously this measured less than 5 mm. There is significant retroperitoneal stranding identified which is significantly increased from prior. Stomach/Bowel: Large bowel has a normal course and caliber. Scattered diverticula. No smaller large bowel dilatation. The stomach is underdistended. Vascular/Lymphatic: Normal caliber aorta with scattered vascular calcifications. There is significant increase in retroperitoneal abnormal lymph nodes. Example retrocaval on series 2 image 37 measures 4.7 x 2.7 cm. On the patient's prior PET-CT this measured 4.0 x 2.2 cm. There is  several others which have increased as well and now cause significant compression of the IVC. Another example is a left para-aortic node which previously would have had dimensions of 11 mm in transverse and today 14 mm on series 2, image 36. Of note caudal to the area of narrowing of the IVC the veins are distended all the way into the area of the lower extremities. There is also edema of the right lower extremity. Please correlate with separate ultrasound of the legs for DVT from same day. Of note, 1 of the retrocaval mass lesions identified does appear to cause deformity of the adjacent vertebral body at L2 and extend into the vertebral body itself. Reproductive: Enlarged prostate with mass effect along the base of the bladder. Other: Anasarca.  Nonspecific presacral stranding. Musculoskeletal: Degenerative changes of the spine and pelvis. Pars defects again L5. Once again there is invasion of a retroperitoneal node into the margin of the vertebral body at L2 best seen on sagittal image 54 and axial image 38. Again new from previous examinations. Critical Value/emergent results were called by telephone at the time of interpretation on 09/07/2022 at 2:19 pm to provider Dr. Joni Fears, who verbally acknowledged these results. IMPRESSION: Marked increase of neoplasm metastatic disease including size of the primary right renal mass. Significant increase in size number perinephric nodules, bilateral adrenal masses, retroperitoneal nodes and liver lesions. The retroperitoneal lymphadenopathy does cause severe narrowing of the IVC focally and associated distention of caudal veins including the pelvis. Please correlate with separate DVT ultrasound. In addition 1 of the retroperitoneal nodes does appear to extend directly into and invade the L2 vertebral body. No bowel obstruction or free air. Scattered anasarca including along the retroperitoneum and presacral regions. Electronically Signed   By: Jill Side M.D.   On:  09/07/2022 17:21   US Venous Img Lower Right (DVT Study)  Result Date: 09/07/2022 CLINICAL DATA:  Acute right lower extremity pain and edema. EXAM: Right LOWER EXTREMITY VENOUS DOPPLER ULTRASOUND TECHNIQUE: Gray-scale sonography with graded compression, as well as color Doppler and duplex ultrasound were performed to evaluate the lower extremity deep venous systems from the level of the common femoral vein and including the common femoral, femoral, profunda femoral, popliteal and calf veins including the posterior tibial, peroneal and gastrocnemius veins when visible. The superficial great saphenous vein was also interrogated. Spectral Doppler was utilized  to evaluate flow at rest and with distal augmentation maneuvers in the common femoral, femoral and popliteal veins. COMPARISON:  None Available. FINDINGS: Contralateral Common Femoral Vein: Respiratory phasicity is normal and symmetric with the symptomatic side. No evidence of thrombus. Normal compressibility. Common Femoral Vein: Noncompressible with no flow consistent with occlusive thrombus. Saphenofemoral Junction: Noncompressible with no flow consistent with occlusive thrombus. Profunda Femoral Vein: Noncompressible with no flow consistent with occlusive thrombus. Femoral Vein: Only partially compressible with possible rouleaux flow present. Popliteal Vein: Only partially compressible with possible rouleaux low flow present. Calf Veins: No evidence of thrombus. Normal compressibility and flow on color Doppler imaging. Superficial Great Saphenous Vein: No evidence of thrombus. Normal compressibility. Venous Reflux:  None. Other Findings: There also appears to be noncompressible thrombus in the visualized portions of the right common and external iliac veins. IMPRESSION: Occlusive deep venous thrombosis is noted in the right common femoral and profundus femoral veins as well as the right saphenofemoral junction. The right superficial femoral and popliteal  veins are only partially compressible with probable rouleaux flow present. There also appears to be noncompressible thrombus within the visualized portions of the right common and external iliac veins. Electronically Signed   By: Marijo Conception M.D.   On: 09/07/2022 15:46   DG Chest Port 1 View  Result Date: 09/07/2022 CLINICAL DATA:  Provided history: Questionable sepsis. Evaluate for abnormality. EXAM: PORTABLE CHEST 1 VIEW COMPARISON:  PET-CT 08/14/2022. FINDINGS: Right chest infusion port catheter with tip at the level of the superior cavoatrial junction. Heart size within normal limits. Numerous bilateral pulmonary nodules consistent with known metastatic disease (as demonstrated on the recent prior PET-CT of 08/14/2022). No appreciable superimposed acute airspace consolidation. No evidence of pleural effusion or pneumothorax. No acute bony abnormality identified. Levocurvature of the thoracic spine. IMPRESSION: Numerous bilateral pulmonary nodules consistent with known metastatic disease (as demonstrated on the recent prior PET-CT of 08/14/2022). No appreciable superimposed acute airspace consolidation. Electronically Signed   By: Kellie Simmering D.O.   On: 09/07/2022 14:23     Assessment/Plan There are no diagnoses linked to this encounter.   Hortencia Pilar, MD  09/25/2022 10:45 AM

## 2022-09-28 ENCOUNTER — Ambulatory Visit (INDEPENDENT_AMBULATORY_CARE_PROVIDER_SITE_OTHER): Payer: BLUE CROSS/BLUE SHIELD | Admitting: Vascular Surgery

## 2022-09-28 ENCOUNTER — Other Ambulatory Visit: Payer: Self-pay | Admitting: Oncology

## 2022-09-28 ENCOUNTER — Encounter (INDEPENDENT_AMBULATORY_CARE_PROVIDER_SITE_OTHER): Payer: Self-pay | Admitting: Vascular Surgery

## 2022-09-28 ENCOUNTER — Inpatient Hospital Stay: Payer: BLUE CROSS/BLUE SHIELD

## 2022-09-28 VITALS — BP 124/89 | HR 118 | Resp 16 | Wt 151.8 lb

## 2022-09-28 VITALS — BP 121/84 | HR 113 | Temp 98.8°F | Resp 16

## 2022-09-28 DIAGNOSIS — I824Y1 Acute embolism and thrombosis of unspecified deep veins of right proximal lower extremity: Secondary | ICD-10-CM

## 2022-09-28 DIAGNOSIS — J439 Emphysema, unspecified: Secondary | ICD-10-CM

## 2022-09-28 DIAGNOSIS — I10 Essential (primary) hypertension: Secondary | ICD-10-CM | POA: Diagnosis not present

## 2022-09-28 DIAGNOSIS — K219 Gastro-esophageal reflux disease without esophagitis: Secondary | ICD-10-CM

## 2022-09-28 DIAGNOSIS — C642 Malignant neoplasm of left kidney, except renal pelvis: Secondary | ICD-10-CM

## 2022-09-28 DIAGNOSIS — Z5111 Encounter for antineoplastic chemotherapy: Secondary | ICD-10-CM | POA: Diagnosis not present

## 2022-09-28 MED ORDER — HEPARIN SOD (PORK) LOCK FLUSH 100 UNIT/ML IV SOLN
500.0000 [IU] | Freq: Once | INTRAVENOUS | Status: DC | PRN
  Filled 2022-09-28: qty 5

## 2022-09-28 MED ORDER — SODIUM CHLORIDE 0.9 % IV SOLN
Freq: Once | INTRAVENOUS | Status: AC
  Filled 2022-09-28: qty 250

## 2022-09-29 ENCOUNTER — Encounter (INDEPENDENT_AMBULATORY_CARE_PROVIDER_SITE_OTHER): Payer: Self-pay | Admitting: Vascular Surgery

## 2022-09-30 ENCOUNTER — Other Ambulatory Visit: Payer: Self-pay | Admitting: *Deleted

## 2022-09-30 DIAGNOSIS — C642 Malignant neoplasm of left kidney, except renal pelvis: Secondary | ICD-10-CM

## 2022-09-30 NOTE — Progress Notes (Signed)
cbc

## 2022-10-01 ENCOUNTER — Inpatient Hospital Stay: Payer: BLUE CROSS/BLUE SHIELD

## 2022-10-01 ENCOUNTER — Inpatient Hospital Stay (HOSPITAL_BASED_OUTPATIENT_CLINIC_OR_DEPARTMENT_OTHER): Payer: BLUE CROSS/BLUE SHIELD | Admitting: Oncology

## 2022-10-01 ENCOUNTER — Encounter: Payer: Self-pay | Admitting: Oncology

## 2022-10-01 VITALS — BP 120/91 | HR 119 | Temp 98.1°F | Resp 17 | Wt 151.6 lb

## 2022-10-01 DIAGNOSIS — C641 Malignant neoplasm of right kidney, except renal pelvis: Secondary | ICD-10-CM | POA: Diagnosis not present

## 2022-10-01 DIAGNOSIS — Z5111 Encounter for antineoplastic chemotherapy: Secondary | ICD-10-CM | POA: Diagnosis not present

## 2022-10-01 DIAGNOSIS — C642 Malignant neoplasm of left kidney, except renal pelvis: Secondary | ICD-10-CM

## 2022-10-01 LAB — CBC WITH DIFFERENTIAL/PLATELET
Abs Immature Granulocytes: 0.1 10*3/uL — ABNORMAL HIGH (ref 0.00–0.07)
Basophils Absolute: 0.1 10*3/uL (ref 0.0–0.1)
Basophils Relative: 1 %
Eosinophils Absolute: 0.2 10*3/uL (ref 0.0–0.5)
Eosinophils Relative: 2 %
HCT: 25.6 % — ABNORMAL LOW (ref 39.0–52.0)
Hemoglobin: 8.6 g/dL — ABNORMAL LOW (ref 13.0–17.0)
Immature Granulocytes: 1 %
Lymphocytes Relative: 9 %
Lymphs Abs: 0.9 10*3/uL (ref 0.7–4.0)
MCH: 30.6 pg (ref 26.0–34.0)
MCHC: 33.6 g/dL (ref 30.0–36.0)
MCV: 91.1 fL (ref 80.0–100.0)
Monocytes Absolute: 1.3 10*3/uL — ABNORMAL HIGH (ref 0.1–1.0)
Monocytes Relative: 13 %
Neutro Abs: 7.9 10*3/uL — ABNORMAL HIGH (ref 1.7–7.7)
Neutrophils Relative %: 74 %
Platelets: 528 10*3/uL — ABNORMAL HIGH (ref 150–400)
RBC: 2.81 MIL/uL — ABNORMAL LOW (ref 4.22–5.81)
RDW: 14.6 % (ref 11.5–15.5)
WBC: 10.5 10*3/uL (ref 4.0–10.5)
nRBC: 0 % (ref 0.0–0.2)

## 2022-10-01 LAB — CMP (CANCER CENTER ONLY)
ALT: 24 U/L (ref 0–44)
AST: 27 U/L (ref 15–41)
Albumin: 2.9 g/dL — ABNORMAL LOW (ref 3.5–5.0)
Alkaline Phosphatase: 140 U/L — ABNORMAL HIGH (ref 38–126)
Anion gap: 7 (ref 5–15)
BUN: 15 mg/dL (ref 8–23)
CO2: 26 mmol/L (ref 22–32)
Calcium: 9.5 mg/dL (ref 8.9–10.3)
Chloride: 96 mmol/L — ABNORMAL LOW (ref 98–111)
Creatinine: 1.13 mg/dL (ref 0.61–1.24)
GFR, Estimated: >60 mL/min (ref >60)
Glucose, Bld: 143 mg/dL — ABNORMAL HIGH (ref 70–99)
Potassium: 3.6 mmol/L (ref 3.5–5.1)
Sodium: 129 mmol/L — ABNORMAL LOW (ref 135–145)
Total Bilirubin: 0.5 mg/dL (ref 0.3–1.2)
Total Protein: 6.7 g/dL (ref 6.5–8.1)

## 2022-10-01 LAB — SAMPLE TO BLOOD BANK

## 2022-10-01 LAB — MAGNESIUM: Magnesium: 1.7 mg/dL (ref 1.7–2.4)

## 2022-10-01 MED ORDER — HEPARIN SOD (PORK) LOCK FLUSH 100 UNIT/ML IV SOLN
500.0000 [IU] | Freq: Once | INTRAVENOUS | Status: DC
  Filled 2022-10-01: qty 5

## 2022-10-01 MED ORDER — HEPARIN SOD (PORK) LOCK FLUSH 100 UNIT/ML IV SOLN
500.0000 [IU] | Freq: Once | INTRAVENOUS | Status: AC | PRN
  Administered 2022-10-01: 500 [IU]
  Filled 2022-10-01: qty 5

## 2022-10-01 MED ORDER — METOCLOPRAMIDE HCL 10 MG PO TABS: 10.0000 mg | ORAL_TABLET | Freq: Four times a day (QID) | ORAL | 1 refills | Status: DC

## 2022-10-01 MED ORDER — SODIUM CHLORIDE 0.9% FLUSH
10.0000 mL | INTRAVENOUS | Status: DC | PRN
  Administered 2022-10-01: 10 mL
  Filled 2022-10-01: qty 10

## 2022-10-01 MED ORDER — SODIUM CHLORIDE 0.9 % IV SOLN
Freq: Once | INTRAVENOUS | Status: AC
  Filled 2022-10-01: qty 250

## 2022-10-01 MED ORDER — SODIUM CHLORIDE 0.9% FLUSH
10.0000 mL | INTRAVENOUS | Status: DC | PRN
  Administered 2022-10-01: 10 mL via INTRAVENOUS
  Filled 2022-10-01: qty 10

## 2022-10-01 MED ORDER — PROCHLORPERAZINE MALEATE 10 MG PO TABS
10.0000 mg | ORAL_TABLET | Freq: Once | ORAL | Status: AC
  Administered 2022-10-01: 10 mg via ORAL
  Filled 2022-10-01: qty 1

## 2022-10-01 MED ORDER — SODIUM CHLORIDE 0.9 % IV SOLN
800.0000 mg/m2 | Freq: Once | INTRAVENOUS | Status: AC
  Administered 2022-10-01: 1596 mg via INTRAVENOUS
  Filled 2022-10-01: qty 26.28

## 2022-10-01 NOTE — Progress Notes (Signed)
Patient here for oncology follow-up appointment, concerns of no appetite requests nutritionist referral

## 2022-10-01 NOTE — Patient Instructions (Signed)
[facility]  Discharge Instructions: Thank you for choosing [facility] to provide your oncology and hematology care.  If you have a lab appointment with the Cancer Center, please go directly to the Cancer Center and check in at the registration area.  Wear comfortable clothing and clothing appropriate for easy access to any Portacath or PICC line.   We strive to give you quality time with your provider. You may need to reschedule your appointment if you arrive late (15 or more minutes).  Arriving late affects you and other patients whose appointments are after yours.  Also, if you miss three or more appointments without notifying the office, you may be dismissed from the clinic at the provider's discretion.      For prescription refill requests, have your pharmacy contact our office and allow 72 hours for refills to be completed.    Today you received the following chemotherapy and/or immunotherapy agents Gemzar       To help prevent nausea and vomiting after your treatment, we encourage you to take your nausea medication as directed.  BELOW ARE SYMPTOMS THAT SHOULD BE REPORTED IMMEDIATELY: *FEVER GREATER THAN 100.4 F (38 C) OR HIGHER *CHILLS OR SWEATING *NAUSEA AND VOMITING THAT IS NOT CONTROLLED WITH YOUR NAUSEA MEDICATION *UNUSUAL SHORTNESS OF BREATH *UNUSUAL BRUISING OR BLEEDING *URINARY PROBLEMS (pain or burning when urinating, or frequent urination) *BOWEL PROBLEMS (unusual diarrhea, constipation, pain near the anus) TENDERNESS IN MOUTH AND THROAT WITH OR WITHOUT PRESENCE OF ULCERS (sore throat, sores in mouth, or a toothache) UNUSUAL RASH, SWELLING OR PAIN  UNUSUAL VAGINAL DISCHARGE OR ITCHING   Items with * indicate a potential emergency and should be followed up as soon as possible or go to the Emergency Department if any problems should occur.  Please show the CHEMOTHERAPY ALERT CARD or IMMUNOTHERAPY ALERT CARD at check-in to  the Emergency Department and triage nurse.  Should you have questions after your visit or need to cancel or reschedule your appointment, please contact [facility]  [phone] and follow the prompts.  Office hours are 8:00 a.m. to 4:30 p.m. Monday - Friday. Please note that voicemails left after 4:00 p.m. may not be returned until the following business day.  We are closed weekends and major holidays. You have access to a nurse at all times for urgent questions. Please call the main number to the clinic [phone] and follow the prompts.  For any non-urgent questions, you may also contact your provider using MyChart. We now offer e-Visits for anyone 18 and older to request care online for non-urgent symptoms. For details visit [email or web address].   Also download the MyChart app! Go to the app store, search "MyChart", open the app, select [facility], and log in with your MyChart username and password.    

## 2022-10-01 NOTE — Progress Notes (Signed)
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  INTERVAL HISTORY: Patient returns to clinic today for further evaluation and reconsideration of cycle 2, day 8 of cisplatin and gemcitabine.  Gemcitabine only today.  Patient feels improved after receiving IV fluids this past week, but continues to have a poor appetite secondary to early satiety/stomach cramping.  He continues to have chronic weakness and fatigue.  He does not complain of pain today. He has no neurologic complaints.  He denies any recent fevers or illnesses.  He has no chest pain, shortness of breath, cough, or hemoptysis.  He denies any nausea, vomiting, or constipation.  He has no urinary complaints.  Patient offers no further specific complaints today.  REVIEW OF SYSTEMS:   Review of Systems  Constitutional:  Positive for malaise/fatigue. Negative for fever and weight loss.  Respiratory: Negative.  Negative for cough, hemoptysis and shortness of breath.   Cardiovascular:  Positive for leg swelling. Negative for chest pain.  Gastrointestinal: Negative.  Negative for abdominal pain, diarrhea and nausea.  Genitourinary: Negative.  Negative for flank pain and hematuria.  Musculoskeletal:  Negative for back pain.  Skin: Negative.  Negative for rash.  Neurological:  Positive for weakness. Negative for dizziness, focal weakness and headaches.  Psychiatric/Behavioral: Negative.  The patient is not nervous/anxious.     As per HPI. Otherwise, a complete review of systems is negative.  PAST MEDICAL HISTORY: Past Medical History:  Diagnosis Date   Cancer [facility]) 2013   bladder    Diverticulitis    History of chickenpox    History of measles    History of mumps    Hyperlipidemia    Hypertension     PAST SURGICAL HISTORY: Past Surgical History:  Procedure Laterality Date   HERNIA REPAIR  2002,2003   IR IMAGING GUIDED PORT INSERTION  08/25/2022   IVC VENOGRAPHY N/A 09/11/2022   Procedure: IVC Venography;  Surgeon: Katha Cabal, MD;  Location: [facility];  Service: Cardiovascular;  Laterality: N/A;   PERIPHERAL VASCULAR THROMBECTOMY Right 09/08/2022   Procedure: PERIPHERAL VASCULAR THROMBECTOMY;  Surgeon: Katha Cabal, MD;  Location: [facility];  Service: Cardiovascular;  Laterality: Right;   SIGMOIDOSCOPY  1998   [facility]   TRANSURETHRAL RESECTION OF BLADDER TUMOR  05/11/2012   Dr. Sherral Hammers, [place]  2002   Bilateral inguinal herniorrhaphies    FAMILY HISTORY: Family History  Problem Relation Age of Onset   COPD Mother    Diverticulosis Mother    Diverticulosis Father    Diabetes Father    Hypertension Father    Diverticulosis Sister    Stroke Paternal Grandmother     ADVANCED DIRECTIVES (Y/N):  N  HEALTH MAINTENANCE: Social History   Tobacco Use   Smoking status: Former    Packs/day: 2.00    Years: 39.00    Additional pack years: 0.00    Total pack years: 78.00    Types: Cigarettes    Quit date: 07/14/2011    Years since quitting: 11.2    Passive exposure: Past   Smokeless tobacco: Never   Tobacco comments:    smoked 1 to 1 1/2  ppd for 35 years  Vaping Use   Vaping Use: Never used  Substance Use Topics   Alcohol use: Yes    Comment: drinks 2 beers daily: none since 1 week ago   Drug use: No     Colonoscopy:  PAP:  Bone density:  Lipid panel:  Allergies  Allergen Reactions   Amoxicillin Itching   Atenolol Diarrhea    Other reaction(s): HIVES   Other Nausea And Vomiting    [employer] Beans   Oxycodone     Other reaction(s): HIVES   Shellfish Allergy Itching and Swelling     Current Outpatient Medications  Medication Sig Dispense Refill   acetaminophen (TYLENOL) 500 MG tablet Take 500 mg by mouth every 6 (six) hours as needed.     apixaban (ELIQUIS) 5 MG TABS tablet Take 2 tablets (10 mg total) by mouth 2 (two) times daily. 28 tablet 0   apixaban (ELIQUIS) 5 MG TABS tablet Take 1 tablet (5 mg total) by mouth 2 (two) times daily. 360 tablet 0   docusate sodium (COLACE) 250 MG capsule Take 250 mg by mouth daily.     fentaNYL (DURAGESIC) 25 MCG/HR Place 1 patch onto the skin every 3 (three) days. 10 patch 0   HYDROcodone-acetaminophen (NORCO) 5-325 MG tablet Take 1 tablet by mouth every 6 (six) hours as needed for moderate pain. 120 tablet 0   lidocaine-prilocaine (EMLA) cream Apply to affected area once 30 g 3   metoCLOPramide (REGLAN) 10 MG tablet Take 1 tablet (10 mg total) by mouth 4 (four) times daily. 120 tablet 1   metoprolol succinate (TOPROL-XL) 25 MG 24 hr tablet TAKE 1 TABLET (25 MG TOTAL) BY MOUTH DAILY. 90 tablet 2   ondansetron (ZOFRAN) 8 MG tablet Take 1 tablet (8 mg total) by mouth every 8 (eight) hours as needed for nausea or vomiting. 90 tablet 3   ondansetron (ZOFRAN-ODT) 8 MG disintegrating tablet Take 8 mg by mouth every 8 (eight) hours as needed.     pantoprazole (PROTONIX) 40 MG tablet TAKE 1 TABLET BY MOUTH EVERY DAY 90 tablet 2   prochlorperazine (COMPAZINE) 10 MG tablet Take 1 tablet (10 mg total) by mouth every 6 (six) hours as needed (Nausea or vomiting). 60 tablet 2   sildenafil (REVATIO) 20 MG tablet Take 1-3 tablets (20-60 mg total) by mouth daily as needed. 30 tablet 11   tamsulosin (FLOMAX) 0.4 MG CAPS capsule Take 1 capsule (0.4 mg total) by mouth daily. 30 capsule 11   traZODone (DESYREL) 50 MG tablet Take 0.5 tablets (25 mg total) by mouth at bedtime as needed for sleep. 30 tablet 0   chlorproMAZINE (THORAZINE) 10 MG tablet Take 1 tablet (10 mg total) by mouth 3 (three) times daily as needed for hiccoughs or nausea. Do not take  when taking Zofran/ondansetron. (Patient not taking: Reported on 09/16/2022) 30 tablet 0   No current facility-administered medications for this visit.   Facility-Administered Medications Ordered in Other Visits  Medication Dose Route Frequency Provider Last Rate Last Admin   0.9 %  sodium chloride infusion   Intravenous Once Grayland Ormond, Kathlene November, MD       0.9 %  sodium chloride infusion   Intravenous Once Lloyd Huger, MD       gemcitabine (GEMZAR) 1,596 mg in sodium chloride 0.9 % 250 mL chemo infusion  800 mg/m2 (Treatment Plan Recorded) Intravenous Once Lloyd Huger, MD       heparin lock flush 100 unit/mL  500  Units Intracatheter Once PRN Lloyd Huger, MD       sodium chloride flush (NS) 0.9 % injection 10 mL  10 mL Intracatheter PRN Lloyd Huger, MD        OBJECTIVE: Vitals:   10/01/22 0913  BP: (!) 120/91  Pulse: (!) 119  Resp: 17  Temp: 98.1 F (36.7 C)  SpO2: 100%     Body mass index is 22.39 kg/m.    ECOG FS:2 - Symptomatic, <50% confined to bed  General: Well-developed, well-nourished, no acute distress.  Sitting in a wheelchair. Eyes: Pink conjunctiva, anicteric sclera. HEENT: Normocephalic, moist mucous membranes. Lungs: No audible wheezing or coughing. Heart: Regular rate and rhythm. Abdomen: Soft, nontender, no obvious distention. Musculoskeletal: No edema, cyanosis, or clubbing. Neuro: Alert, answering all questions appropriately. Cranial nerves grossly intact. Skin: No rashes or petechiae noted. Psych: Normal affect.   LAB RESULTS:  Lab Results  Component Value Date   NA 129 (L) 10/01/2022   K 3.6 10/01/2022   CL 96 (L) 10/01/2022   CO2 26 10/01/2022   GLUCOSE 143 (H) 10/01/2022   BUN 15 10/01/2022   CREATININE 1.13 10/01/2022   CALCIUM 9.5 10/01/2022   PROT 6.7 10/01/2022   ALBUMIN 2.9 (L) 10/01/2022   AST 27 10/01/2022   ALT 24 10/01/2022   ALKPHOS 140 (H) 10/01/2022   BILITOT 0.5 10/01/2022   GFRNONAA >60 10/01/2022    GFRAA 90 06/10/2020    Lab Results  Component Value Date   WBC 10.5 10/01/2022   NEUTROABS 7.9 (H) 10/01/2022   HGB 8.6 (L) 10/01/2022   HCT 25.6 (L) 10/01/2022   MCV 91.1 10/01/2022   PLT 528 (H) 10/01/2022     STUDIES: PERIPHERAL VASCULAR CATHETERIZATION  Result Date: 09/11/2022 See surgical note for result.  ECHOCARDIOGRAM COMPLETE  Result Date: 09/10/2022    ECHOCARDIOGRAM REPORT   Patient Name:   BRITTEN DRAGONETTI Date of Exam: 09/09/2022 Medical Rec #:  [id number]      Height:       69.0 in Accession #:    [id number]     Weight:       174.2 lb Date of Birth:  09-01-1955      BSA:          1.948 m Patient Age:    16 years       BP:           120/74 mmHg Patient Gender: M              HR:           97 bpm. Exam Location:  [facility] Procedure: 2D Echo, Cardiac Doppler and Color Doppler Indications:     R55 Syncope                  I26.09 Pulmonary Embolus  History:         Patient has no prior history of Echocardiogram examinations.                  Risk Factors:Hypertension and Dyslipidemia. Cancer.  Sonographer:     Cresenciano Lick RDCS Referring Phys:  [id number] [facility] Diagnosing Phys: Ida Rogue MD IMPRESSIONS  1. Left ventricular ejection fraction, by estimation, is 60 to 65%. The left ventricle has normal function. The left ventricle has no regional wall motion abnormalities. There is mild left ventricular hypertrophy. Left ventricular diastolic parameters are consistent with Grade I diastolic dysfunction (impaired relaxation).  2. Right ventricular systolic function  is normal. The right ventricular size is normal. Tricuspid regurgitation signal is inadequate for assessing PA pressure.  3. The mitral valve is normal in structure. Mild mitral valve regurgitation. No evidence of mitral stenosis.  4. The aortic valve is normal in structure. Aortic valve regurgitation is not visualized. No aortic stenosis is present.  5. The inferior vena cava is normal in size with greater  than 50% respiratory variability, suggesting right atrial pressure of 3 mmHg. FINDINGS  Left Ventricle: Left ventricular ejection fraction, by estimation, is 60 to 65%. The left ventricle has normal function. The left ventricle has no regional wall motion abnormalities. The left ventricular internal cavity size was normal in size. There is  mild left ventricular hypertrophy. Left ventricular diastolic parameters are consistent with Grade I diastolic dysfunction (impaired relaxation). Right Ventricle: The right ventricular size is normal. No increase in right ventricular wall thickness. Right ventricular systolic function is normal. Tricuspid regurgitation signal is inadequate for assessing PA pressure. Left Atrium: Left atrial size was normal in size. Right Atrium: Right atrial size was normal in size. Pericardium: There is no evidence of pericardial effusion. Mitral Valve: The mitral valve is normal in structure. Mild mitral valve regurgitation. No evidence of mitral valve stenosis. Tricuspid Valve: The tricuspid valve is normal in structure. Tricuspid valve regurgitation is mild . No evidence of tricuspid stenosis. Aortic Valve: The aortic valve is normal in structure. Aortic valve regurgitation is not visualized. No aortic stenosis is present. Pulmonic Valve: The pulmonic valve was normal in structure. Pulmonic valve regurgitation is not visualized. No evidence of pulmonic stenosis. Aorta: The aortic root is normal in size and structure. Venous: The inferior vena cava is normal in size with greater than 50% respiratory variability, suggesting right atrial pressure of 3 mmHg. IAS/Shunts: No atrial level shunt detected by color flow Doppler.  LEFT VENTRICLE PLAX 2D LVIDd:         4.60 cm   Diastology LVIDs:         2.90 cm   LV e' medial:    8.16 cm/s LV PW:         0.80 cm   LV E/e' medial:  9.2 LV IVS:        0.80 cm   LV e' lateral:   10.95 cm/s LVOT diam:     2.10 cm   LV E/e' lateral: 6.9 LV SV:         50 LV  SV Index:   26 LVOT Area:     3.46 cm  RIGHT VENTRICLE          IVC RV Basal diam:  3.40 cm  IVC diam: 1.40 cm RV Mid diam:    3.90 cm LEFT ATRIUM             Index        RIGHT ATRIUM           Index LA diam:        3.70 cm 1.90 cm/m   RA Area:     10.10 cm LA Vol (A2C):   39.9 ml 20.48 ml/m  RA Volume:   23.00 ml  11.81 ml/m LA Vol (A4C):   27.1 ml 13.91 ml/m LA Biplane Vol: 35.8 ml 18.38 ml/m  AORTIC VALVE LVOT Vmax:   100.07 cm/s LVOT Vmean:  66.133 cm/s LVOT VTI:    0.145 m  AORTA Ao Root diam: 3.80 cm Ao Asc diam:  3.30 cm MITRAL VALVE MV Area (PHT): 4.96 cm  SHUNTS MV Decel Time: 153 msec    Systemic VTI:  0.15 m MV E velocity: 75.45 cm/s  Systemic Diam: 2.10 cm MV A velocity: 76.50 cm/s MV E/A ratio:  0.99 Ida Rogue MD Electronically signed by Ida Rogue MD Signature Date/Time: 09/10/2022/7:28:09 AM    Final    PERIPHERAL VASCULAR CATHETERIZATION  Result Date: 09/08/2022 See surgical note for result.  CT Angio Chest PE W and/or Wo Contrast  Result Date: 09/07/2022 CLINICAL DATA:  Stage IV ureteral carcinoma. Hypotension and syncope. * Tracking Code: BO * EXAM: CT ANGIOGRAPHY CHEST WITH CONTRAST TECHNIQUE: Multidetector CT imaging of the chest was performed using the standard protocol during bolus administration of intravenous contrast. Multiplanar CT image reconstructions and MIPs were obtained to evaluate the vascular anatomy. RADIATION DOSE REDUCTION: This exam was performed according to the departmental dose-optimization program which includes automated exposure control, adjustment of the mA and/or kV according to patient size and/or use of iterative reconstruction technique. CONTRAST:  162mL OMNIPAQUE IOHEXOL 350 MG/ML SOLN COMPARISON:  PET-CT 08/14/2022.  Chest CT 08/05/2022 noncontrast FINDINGS: Cardiovascular: Heart is nonenlarged. Trace pericardial fluid. Right upper chest port in place. The port is accessed. In the right lower lobe is a filling defect along the medial  branch of the right lower lobe pulmonary artery consistent with a subsegmental pulmonary embolism. No other larger and more central emboli are identified. Mediastinum/Nodes: No specific abnormal lymph node enlargement identified in the axillary regions. There are several abnormal mediastinal and hilar lymph nodes once again identified. These are increased from the study of 08/05/2022. Example to the left of the main pulmonary artery on series 4, image 67 measures 2.3 x 1.7 cm. Right hilar node anteriorly on series 4, image 68 measures 15 by 12 mm. Left hilar node on image 80 of series 4 measures 16 by 11 mm. Several other nodes are identified. There are also some prominent nodes along the thoracic inlet on the left side. All of these are also larger than the PET-CT scan of 08/14/22. Normal caliber thoracic esophagus. Lungs/Pleura: No pneumothorax or consolidation. Trace right-sided pleural fluid. Once again there are numerous bilateral lung nodules of varying size but the vast majority are under a cm. At least 15 nodules are identified. There is 1 lesion along the right lower lobe on image 95 of series 6 which is cavitary measuring 2.3 by 1.9 cm. Example nodule in the left lung, lower lobe on series 6, image 97 measures 12 by 10 mm. All of these are increased in size and number. Upper Abdomen: There are multiple perinephric space nodules involving the right kidney as well as poor visualization of the parenchyma of the right kidney consistent with known mass lesions. There is also nodular enlargement of the adrenal glands which also appears to have increased. Musculoskeletal: Mild degenerative changes along the spine. Review of the MIP images confirms the above findings. Critical Value/emergent results were called by telephone at the time of interpretation on 09/07/2022 at 2:07 pm to provider Dr. Joni Fears, who verbally acknowledged these results. IMPRESSION: Progression of metastatic disease. This includes lung nodules,  mediastinal lymph nodes and upper abdominal lesions. Right lower lobe pulmonary emboli. Please see separate dictation of abdomen and pelvis CT from same day Electronically Signed   By: Jill Side M.D.   On: 09/07/2022 17:21   CT ABDOMEN PELVIS W CONTRAST  Result Date: 09/07/2022 CLINICAL DATA:  Epigastric pain again leg swelling. 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 * Tracking Code: BO *  EXAM: CT ABDOMEN AND PELVIS WITH CONTRAST TECHNIQUE: Multidetector CT imaging of the abdomen and pelvis was performed using the standard protocol following bolus administration of intravenous contrast. RADIATION DOSE REDUCTION: This exam was performed according to the departmental dose-optimization program which includes automated exposure control, adjustment of the mA and/or kV according to patient size and/or use of iterative reconstruction technique. CONTRAST:  143mL OMNIPAQUE IOHEXOL 350 MG/ML SOLN COMPARISON:  PET-CT 08/14/2022.  Standard CT 07/31/2022 FINDINGS: Lower chest: Please see separate dictation of chest CT angiogram from same date Hepatobiliary: Liver metastases are noted. Central lesion in the right hepatic lobe with some peripheral segmental biliary duct ectasia is now identified on series 2, image 15 measuring 2.2 x 1.6 cm. The duct dilatation is likely related to secondary mass effect. Small lesions are also seen peripherally in the right hepatic lobe on image 17 and 21 in segment 4. Portal vein is patent. Gallbladder is nondilated. Pancreas: Unremarkable. No pancreatic ductal dilatation or surrounding inflammatory changes. Spleen: Normal in size without focal abnormality. Adrenals/Urinary Tract: Increasing left adrenal nodule. On image 26 of series 2 this measures 17 x 15 mm. There is also an increasing right adrenal metastasis. Left kidney has some small Bosniak 1 cysts, unchanged from prior. No left-sided renal collecting system dilatation. Left ureter has a normal course and caliber  down to the bladder. Preserved contours of the urinary bladder. There is large infiltrative mass involving the right kidney along its posterior aspect. Previously this measured 6.0 x 4.6 cm and on the current examination 6.5 by 4.4 cm. Once again there is ill-defined margins extending into the renal hilum collecting system. In addition there is marked increase in the number and extent of perinephric space nodules diffusely. Example superiorly on series 28 of series 2 measuring 15 by 14 mm. Previously this measured less than 5 mm. There is significant retroperitoneal stranding identified which is significantly increased from prior. Stomach/Bowel: Large bowel has a normal course and caliber. Scattered diverticula. No smaller large bowel dilatation. The stomach is underdistended. Vascular/Lymphatic: Normal caliber aorta with scattered vascular calcifications. There is significant increase in retroperitoneal abnormal lymph nodes. Example retrocaval on series 2 image 37 measures 4.7 x 2.7 cm. On the patient's prior PET-CT this measured 4.0 x 2.2 cm. There is several others which have increased as well and now cause significant compression of the IVC. Another example is a left para-aortic node which previously would have had dimensions of 11 mm in transverse and today 14 mm on series 2, image 36. Of note caudal to the area of narrowing of the IVC the veins are distended all the way into the area of the lower extremities. There is also edema of the right lower extremity. Please correlate with separate ultrasound of the legs for DVT from same day. Of note, 1 of the retrocaval mass lesions identified does appear to cause deformity of the adjacent vertebral body at L2 and extend into the vertebral body itself. Reproductive: Enlarged prostate with mass effect along the base of the bladder. Other: Anasarca.  Nonspecific presacral stranding. Musculoskeletal: Degenerative changes of the spine and pelvis. Pars defects again L5.  Once again there is invasion of a retroperitoneal node into the margin of the vertebral body at L2 best seen on sagittal image 54 and axial image 38. Again new from previous examinations. Critical Value/emergent results were called by telephone at the time of interpretation on 09/07/2022 at 2:19 pm to provider Dr. Joni Fears, who verbally acknowledged these results. IMPRESSION: Marked  increase of neoplasm metastatic disease including size of the primary right renal mass. Significant increase in size number perinephric nodules, bilateral adrenal masses, retroperitoneal nodes and liver lesions. The retroperitoneal lymphadenopathy does cause severe narrowing of the IVC focally and associated distention of caudal veins including the pelvis. Please correlate with separate DVT ultrasound. In addition 1 of the retroperitoneal nodes does appear to extend directly into and invade the L2 vertebral body. No bowel obstruction or free air. Scattered anasarca including along the retroperitoneum and presacral regions. Electronically Signed   By: Jill Side M.D.   On: 09/07/2022 17:21   US Venous Img Lower Right (DVT Study)  Result Date: 09/07/2022 CLINICAL DATA:  Acute right lower extremity pain and edema. EXAM: Right LOWER EXTREMITY VENOUS DOPPLER ULTRASOUND TECHNIQUE: Gray-scale sonography with graded compression, as well as color Doppler and duplex ultrasound were performed to evaluate the lower extremity deep venous systems from the level of the common femoral vein and including the common femoral, femoral, profunda femoral, popliteal and calf veins including the posterior tibial, peroneal and gastrocnemius veins when visible. The superficial great saphenous vein was also interrogated. Spectral Doppler was utilized to evaluate flow at rest and with distal augmentation maneuvers in the common femoral, femoral and popliteal veins. COMPARISON:  None Available. FINDINGS: Contralateral Common Femoral Vein: Respiratory phasicity  is normal and symmetric with the symptomatic side. No evidence of thrombus. Normal compressibility. Common Femoral Vein: Noncompressible with no flow consistent with occlusive thrombus. Saphenofemoral Junction: Noncompressible with no flow consistent with occlusive thrombus. Profunda Femoral Vein: Noncompressible with no flow consistent with occlusive thrombus. Femoral Vein: Only partially compressible with possible rouleaux flow present. Popliteal Vein: Only partially compressible with possible rouleaux low flow present. Calf Veins: No evidence of thrombus. Normal compressibility and flow on color Doppler imaging. Superficial Great Saphenous Vein: No evidence of thrombus. Normal compressibility. Venous Reflux:  None. Other Findings: There also appears to be noncompressible thrombus in the visualized portions of the right common and external iliac veins. IMPRESSION: Occlusive deep venous thrombosis is noted in the right common femoral and profundus femoral veins as well as the right saphenofemoral junction. The right superficial femoral and popliteal veins are only partially compressible with probable rouleaux flow present. There also appears to be noncompressible thrombus within the visualized portions of the right common and external iliac veins. Electronically Signed   By: Marijo Conception M.D.   On: 09/07/2022 15:46   DG Chest Port 1 View  Result Date: 09/07/2022 CLINICAL DATA:  Provided history: Questionable sepsis. Evaluate for abnormality. EXAM: PORTABLE CHEST 1 VIEW COMPARISON:  PET-CT 08/14/2022. FINDINGS: Right chest infusion port catheter with tip at the level of the superior cavoatrial junction. Heart size within normal limits. Numerous bilateral pulmonary nodules consistent with known metastatic disease (as demonstrated on the recent prior PET-CT of 08/14/2022). No appreciable superimposed acute airspace consolidation. No evidence of pleural effusion or pneumothorax. No acute bony abnormality  identified. Levocurvature of the thoracic spine. IMPRESSION: Numerous bilateral pulmonary nodules consistent with known metastatic disease (as demonstrated on the recent prior PET-CT of 08/14/2022). No appreciable superimposed acute airspace consolidation. Electronically Signed   By: Kellie Simmering D.O.   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  This lesion was not evident on CT scan from June 19, 2020.  PET scan results from August 14, 2022 reviewed independently with hypermetabolic kidney mass, retroperitoneal lymphadenopathy and multiple bilateral pulmonary nodules consistent with metastatic disease.  Biopsy confirmed diagnosis.  Repeat CT scan while patient was admitted to the hospital on September 07, 2022 appears to reveal progression of disease, but unsure the clinical significance of this with patient only having 1 cycle of chemotherapy.  He is receiving systemic chemotherapy using cisplatin and gemcitabine on day 1 with gemcitabine only on day 8.  This will be a 21-day cycle.  Both cisplatin and gemcitabine have been dose reduced 20%.  Patient has had port placement.  Proceed with cycle 2, day 8 of treatment today.  Patient will also receive an additional liter of IV fluids on day 8 of each cycle.  Return to clinic twice next week for IV fluids and then in 2 weeks for further evaluation and consideration of cycle 3, day 1.   Pain: Patient does not complain of this today.  Continue fentanyl patch and Norco as needed. Nausea: Continue antiemetics as needed. Diarrhea: Resolved.   Hyponatremia: Sodium improved to 129. Renal insufficiency: Resolved with IV fluids.  Return to clinic twice next week for additional  fluids.   Leukocytosis: Resolved.   Anemia: Hemoglobin has trended down slightly to 8.6, monitor.   Hypokalemia: Resolved.   DVT: Continue Eliquis as prescribed.  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  Follow-up with vascular surgery as scheduled. Poor appetite/early satiety: Patient was given a referral to dietary as well as a prescription for Reglan.   Patient expressed understanding and was in agreement with this plan. He also understands that He can call clinic at any time with any questions, concerns, or complaints.    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

## 2022-10-05 ENCOUNTER — Inpatient Hospital Stay (HOSPITAL_BASED_OUTPATIENT_CLINIC_OR_DEPARTMENT_OTHER): Payer: BLUE CROSS/BLUE SHIELD | Admitting: Nurse Practitioner

## 2022-10-05 ENCOUNTER — Encounter: Payer: Self-pay | Admitting: Nurse Practitioner

## 2022-10-05 ENCOUNTER — Encounter: Payer: Self-pay | Admitting: Oncology

## 2022-10-05 ENCOUNTER — Inpatient Hospital Stay: Payer: BLUE CROSS/BLUE SHIELD

## 2022-10-05 ENCOUNTER — Ambulatory Visit
Admission: RE | Admit: 2022-10-05 | Discharge: 2022-10-05 | Disposition: A | Discharge status: Home / Self Care | Payer: BLUE CROSS/BLUE SHIELD | Source: Ambulatory Visit | Attending: Nurse Practitioner | Admitting: Nurse Practitioner

## 2022-10-05 ENCOUNTER — Other Ambulatory Visit: Payer: Self-pay

## 2022-10-05 VITALS — BP 132/88 | HR 93

## 2022-10-05 VITALS — BP 130/87 | HR 116 | Temp 96.8°F | Resp 18

## 2022-10-05 DIAGNOSIS — R051 Acute cough: Secondary | ICD-10-CM

## 2022-10-05 DIAGNOSIS — J189 Pneumonia, unspecified organism: Secondary | ICD-10-CM

## 2022-10-05 DIAGNOSIS — Z5111 Encounter for antineoplastic chemotherapy: Secondary | ICD-10-CM | POA: Diagnosis not present

## 2022-10-05 DIAGNOSIS — C642 Malignant neoplasm of left kidney, except renal pelvis: Secondary | ICD-10-CM

## 2022-10-05 MED ORDER — LEVOFLOXACIN 500 MG PO TABS: 500.0000 mg | ORAL_TABLET | Freq: Every day | ORAL | 0 refills | Status: AC

## 2022-10-05 MED ORDER — HEPARIN SOD (PORK) LOCK FLUSH 100 UNIT/ML IV SOLN
500.0000 [IU] | Freq: Once | INTRAVENOUS | Status: AC | PRN
  Administered 2022-10-05: 500 [IU]
  Filled 2022-10-05: qty 5

## 2022-10-05 MED ORDER — SODIUM CHLORIDE 0.9 % IV SOLN
Freq: Once | INTRAVENOUS | Status: AC
  Filled 2022-10-05: qty 250

## 2022-10-05 MED ORDER — SODIUM CHLORIDE 0.9% FLUSH
10.0000 mL | Freq: Once | INTRAVENOUS | Status: AC | PRN
  Administered 2022-10-05: 10 mL
  Filled 2022-10-05: qty 10

## 2022-10-05 NOTE — Progress Notes (Signed)
[facility]. [address] [facility] [address], [place], [place] [address] [phone] (phone) [phone] (fax)  Patient Care Team: Birdie Sons, MD as PCP - General (Family Medicine) Billey Co, MD as Consulting Physician (Urology) Lloyd Huger, MD as Consulting Physician (Oncology)   Name of the patient: Jeffrey Fernandez  [id number]  [birth year]   Date of visit: 10/05/22  Diagnosis- Urothelial carcinoma of the kidney  Chief complaint/ Reason for visit-  cough  Heme/Onc history:  Oncology History  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[place])  08/19/2022 Initial Diagnosis   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[place])   08/19/2022 Cancer Staging   Staging form: Kidney, AJCC 8th Edition - Clinical stage from 08/19/2022: Stage IV (cT4, cN1, cM1) - Signed by Lloyd Huger, MD on 08/19/2022   08/27/2022 -  Chemotherapy   Patient is on Treatment Plan : Urothelial carcinoma of kidney: Cisplatin D1 + Gemcitabine D1,8 q21d x 6 Cycles       Interval history- Patient is 67 year old male currently receiving cis-gem chemotherapy for metastatic urothelial carcinoma of the kidney who [facility] for complaints of cough. Per patient, after treatment last week he developed some dry cough. No fevers or chills. Reports some sinus drainage and congestion. Cough improves with sitting upright. No chest pain or shortness of breath. Says he generally feels poorly.   Review of systems- Review of Systems  Constitutional:  Positive for chills and malaise/fatigue. Negative for fever and weight loss.  HENT:  Positive for congestion and ear pain. Negative for hearing loss, nosebleeds, sore throat and tinnitus.   Eyes:  Negative for blurred vision and double vision.  Respiratory:  Positive for cough and sputum production. Negative for hemoptysis, shortness of breath and  wheezing.   Cardiovascular:  Negative for chest pain, palpitations and leg swelling.  Gastrointestinal:  Negative for abdominal pain, blood in stool, constipation, diarrhea, melena, nausea and vomiting.  Genitourinary:  Negative for dysuria and urgency.  Musculoskeletal:  Negative for back pain, falls, joint pain and myalgias.  Skin:  Negative for itching and rash.  Neurological:  Negative for dizziness, tingling, sensory change, loss of consciousness, weakness and headaches.  Endo/Heme/Allergies:  Negative for environmental allergies. Does not bruise/bleed easily.  Psychiatric/Behavioral:  Negative for depression. The patient is not nervous/anxious and does not have insomnia.     Allergies  Allergen Reactions   Amoxicillin Itching   Atenolol Diarrhea    Other reaction(s): HIVES   Other Nausea And Vomiting    Navy Beans   Oxycodone     Other reaction(s): HIVES   Shellfish Allergy Itching and Swelling   Past Medical History:  Diagnosis Date   Cancer ([place]) 2013   bladder   Diverticulitis    History of chickenpox    History of measles    History of mumps    Hyperlipidemia    Hypertension    Past Surgical History:  Procedure Laterality Date   HERNIA REPAIR  2002,2003   IR IMAGING GUIDED PORT INSERTION  08/25/2022   IVC VENOGRAPHY N/A 09/11/2022   Procedure: IVC Venography;  Surgeon: Katha Cabal, MD;  Location: [facility];  Service: Cardiovascular;  Laterality: N/A;   PERIPHERAL VASCULAR THROMBECTOMY Right 09/08/2022   Procedure: PERIPHERAL VASCULAR THROMBECTOMY;  Surgeon: Katha Cabal, MD;  Location: [facility];  Service: Cardiovascular;  Laterality: Right;  SIGMOIDOSCOPY  1998   [facility]   TRANSURETHRAL RESECTION OF BLADDER TUMOR  05/11/2012   Dr. Sherral Hammers, [facility]   VASECTOMY  2002   Bilateral inguinal herniorrhaphies   Social History   Socioeconomic History   Marital status: Married    Spouse name: Anderson [place]   Number of  children: 3   Years of education: Not on file   Highest education level: Not on file  Occupational History   Occupation: Truck Geophysicist/field seismologist  Tobacco Use   Smoking status: Former    Packs/day: 2.00    Years: 39.00    Additional pack years: 0.00    Total pack years: 78.00    Types: Cigarettes    Quit date: 07/14/2011    Years since quitting: 11.2    Passive exposure: Past   Smokeless tobacco: Never   Tobacco comments:    smoked 1 to 1 1/2  ppd for 35 years  Vaping Use   Vaping Use: Never used  Substance and Sexual Activity   Alcohol use: Yes    Comment: drinks 2 beers daily: none since 1 week ago   Drug use: No   Sexual activity: Not on file  Other Topics Concern   Not on file  Social History Narrative   Lives at home with wife , daughter , 3 grand daughters, son & his 2 kids    Social Determinants of Health   Financial Resource Strain: Not on file  Food Insecurity: No Food Insecurity (09/07/2022)   Hunger Vital Sign    Worried About Running Out of Food in the Last Year: Never true    Ran Out of Food in the Last Year: Never true  Transportation Needs: No Transportation Needs (09/07/2022)   PRAPARE - Hydrologist (Medical): No    Lack of Transportation (Non-Medical): No  Physical Activity: Not on file  Stress: Not on file  Social Connections: Not on file  Intimate Partner Violence: Not At Risk (09/07/2022)   Humiliation, Afraid, Rape, and Kick questionnaire    Fear of Current or Ex-Partner: No    Emotionally Abused: No    Physically Abused: No    Sexually Abused: No   Family History  Problem Relation Age of Onset   COPD Mother    Diverticulosis Mother    Diverticulosis Father    Diabetes Father    Hypertension Father    Diverticulosis Sister    Stroke Paternal Grandmother     Current Outpatient Medications:    apixaban (ELIQUIS) 5 MG TABS tablet, Take 1 tablet (5 mg total) by mouth 2 (two) times daily., Disp: 360 tablet, Rfl: 0    lidocaine-prilocaine (EMLA) cream, Apply to affected area once, Disp: 30 g, Rfl: 3   metoCLOPramide (REGLAN) 10 MG tablet, Take 1 tablet (10 mg total) by mouth 4 (four) times daily., Disp: 120 tablet, Rfl: 1   sildenafil (REVATIO) 20 MG tablet, Take 1-3 tablets (20-60 mg total) by mouth daily as needed., Disp: 30 tablet, Rfl: 11   tamsulosin (FLOMAX) 0.4 MG CAPS capsule, Take 1 capsule (0.4 mg total) by mouth daily., Disp: 30 capsule, Rfl: 11   acetaminophen (TYLENOL) 500 MG tablet, Take 500 mg by mouth every 6 (six) hours as needed. (Patient not taking: Reported on 10/05/2022), Disp: , Rfl:    docusate sodium (COLACE) 250 MG capsule, Take 250 mg by mouth daily. (Patient not taking: Reported on 10/05/2022), Disp: , Rfl:    fentaNYL (DURAGESIC) 25 MCG/HR, Place  1 patch onto the skin every 3 (three) days. (Patient not taking: Reported on 10/05/2022), Disp: 10 patch, Rfl: 0   HYDROcodone-acetaminophen (NORCO) 5-325 MG tablet, Take 1 tablet by mouth every 6 (six) hours as needed for moderate pain. (Patient not taking: Reported on 10/05/2022), Disp: 120 tablet, Rfl: 0   metoprolol succinate (TOPROL-XL) 25 MG 24 hr tablet, TAKE 1 TABLET (25 MG TOTAL) BY MOUTH DAILY. (Patient not taking: Reported on 10/05/2022), Disp: 90 tablet, Rfl: 2   ondansetron (ZOFRAN) 8 MG tablet, Take 1 tablet (8 mg total) by mouth every 8 (eight) hours as needed for nausea or vomiting. (Patient not taking: Reported on 10/05/2022), Disp: 90 tablet, Rfl: 3   ondansetron (ZOFRAN-ODT) 8 MG disintegrating tablet, Take 8 mg by mouth every 8 (eight) hours as needed. (Patient not taking: Reported on 10/05/2022), Disp: , Rfl:    pantoprazole (PROTONIX) 40 MG tablet, TAKE 1 TABLET BY MOUTH EVERY DAY (Patient not taking: Reported on 10/05/2022), Disp: 90 tablet, Rfl: 2   prochlorperazine (COMPAZINE) 10 MG tablet, Take 1 tablet (10 mg total) by mouth every 6 (six) hours as needed (Nausea or vomiting). (Patient not taking: Reported on 10/05/2022), Disp: 60  tablet, Rfl: 2   traZODone (DESYREL) 50 MG tablet, Take 0.5 tablets (25 mg total) by mouth at bedtime as needed for sleep. (Patient not taking: Reported on 10/05/2022), Disp: 30 tablet, Rfl: 0  Physical exam:  Vitals:   10/05/22 0930  BP: 130/87  Pulse: (!) 116  Resp: 18  Temp: (!) 96.8 F (36 C)  TempSrc: Tympanic  SpO2: 100%   Physical Exam Constitutional:      Appearance: He is not ill-appearing.  HENT:     Right Ear: Tenderness present. There is impacted cerumen. No mastoid tenderness.     Left Ear: No tenderness.  No middle ear effusion. No mastoid tenderness. Tympanic membrane is not injected or bulging.     Nose: Congestion present.     Mouth/Throat:     Mouth: Mucous membranes are moist.  Cardiovascular:     Rate and Rhythm: Normal rate and regular rhythm.  Pulmonary:     Effort: No respiratory distress.     Breath sounds: No wheezing.     Comments: Diminished bilaterally Abdominal:     General: There is no distension.     Tenderness: There is no abdominal tenderness.  Musculoskeletal:        General: No deformity.  Lymphadenopathy:     Cervical: No cervical adenopathy.  Skin:    General: Skin is warm.     Coloration: Skin is pale. Skin is not jaundiced.  Neurological:     Mental Status: He is alert and oriented to person, place, and time.  Psychiatric:        Mood and Affect: Mood normal.        Behavior: Behavior normal.        Latest Ref Rng & Units 10/01/2022    8:45 AM  CMP  Glucose 70 - 99 mg/dL 143   BUN 8 - 23 mg/dL 15   Creatinine 0.61 - 1.24 mg/dL 1.13   Sodium 135 - 145 mmol/L 129   Potassium 3.5 - 5.1 mmol/L 3.6   Chloride 98 - 111 mmol/L 96   CO2 22 - 32 mmol/L 26   Calcium 8.9 - 10.3 mg/dL 9.5   Total Protein 6.5 - 8.1 g/dL 6.7   Total Bilirubin 0.3 - 1.2 mg/dL 0.5   Alkaline Phos 38 - 126  U/L 140   AST 15 - 41 U/L 27   ALT 0 - 44 U/L 24       Latest Ref Rng & Units 10/01/2022    8:45 AM  CBC  WBC 4.0 - 10.5 K/uL 10.5    Hemoglobin 13.0 - 17.0 g/dL 8.6   Hematocrit 39.0 - 52.0 % 25.6   Platelets 150 - 400 K/uL 528    PERIPHERAL VASCULAR CATHETERIZATION  Result Date: 09/11/2022 See surgical note for result.  ECHOCARDIOGRAM COMPLETE  Result Date: 09/10/2022    ECHOCARDIOGRAM REPORT   Patient Name:   ANDYN PETRU Date of Exam: 09/09/2022 Medical Rec #:  [id number]      Height:       69.0 in Accession #:    [id number]     Weight:       174.2 lb Date of Birth:  [birth year]      BSA:          1.948 m Patient Age:    67 years       BP:           120/74 mmHg Patient Gender: M              HR:           97 bpm. Exam Location:  [facility] Procedure: 2D Echo, Cardiac Doppler and Color Doppler Indications:     R55 Syncope                  I26.09 Pulmonary Embolus  History:         Patient has no prior history of Echocardiogram examinations.                  Risk Factors:Hypertension and Dyslipidemia. Cancer.  Sonographer:     Cresenciano Lick RDCS Referring Phys:  [id number] [facility] Diagnosing Phys: Ida Rogue MD IMPRESSIONS  1. Left ventricular ejection fraction, by estimation, is 60 to 65%. The left ventricle has normal function. The left ventricle has no regional wall motion abnormalities. There is mild left ventricular hypertrophy. Left ventricular diastolic parameters are consistent with Grade I diastolic dysfunction (impaired relaxation).  2. Right ventricular systolic function is normal. The right ventricular size is normal. Tricuspid regurgitation signal is inadequate for assessing PA pressure.  3. The mitral valve is normal in structure. Mild mitral valve regurgitation. No evidence of mitral stenosis.  4. The aortic valve is normal in structure. Aortic valve regurgitation is not visualized. No aortic stenosis is present.  5. The inferior vena cava is normal in size with greater than 50% respiratory variability, suggesting right atrial pressure of 3 mmHg. FINDINGS  Left Ventricle: Left ventricular ejection  fraction, by estimation, is 60 to 65%. The left ventricle has normal function. The left ventricle has no regional wall motion abnormalities. The left ventricular internal cavity size was normal in size. There is  mild left ventricular hypertrophy. Left ventricular diastolic parameters are consistent with Grade I diastolic dysfunction (impaired relaxation). Right Ventricle: The right ventricular size is normal. No increase in right ventricular wall thickness. Right ventricular systolic function is normal. Tricuspid regurgitation signal is inadequate for assessing PA pressure. Left Atrium: Left atrial size was normal in size. Right Atrium: Right atrial size was normal in size. Pericardium: There is no evidence of pericardial effusion. Mitral Valve: The mitral valve is normal in structure. Mild mitral valve regurgitation. No evidence of mitral valve stenosis. Tricuspid Valve: The tricuspid valve is normal in structure. Tricuspid  valve regurgitation is mild . No evidence of tricuspid stenosis. Aortic Valve: The aortic valve is normal in structure. Aortic valve regurgitation is not visualized. No aortic stenosis is present. Pulmonic Valve: The pulmonic valve was normal in structure. Pulmonic valve regurgitation is not visualized. No evidence of pulmonic stenosis. Aorta: The aortic root is normal in size and structure. Venous: The inferior vena cava is normal in size with greater than 50% respiratory variability, suggesting right atrial pressure of 3 mmHg. IAS/Shunts: No atrial level shunt detected by color flow Doppler.  LEFT VENTRICLE PLAX 2D LVIDd:         4.60 cm   Diastology LVIDs:         2.90 cm   LV e' medial:    8.16 cm/s LV PW:         0.80 cm   LV E/e' medial:  9.2 LV IVS:        0.80 cm   LV e' lateral:   10.95 cm/s LVOT diam:     2.10 cm   LV E/e' lateral: 6.9 LV SV:         50 LV SV Index:   26 LVOT Area:     3.46 cm  RIGHT VENTRICLE          IVC RV Basal diam:  3.40 cm  IVC diam: 1.40 cm RV Mid diam:     3.90 cm LEFT ATRIUM             Index        RIGHT ATRIUM           Index LA diam:        3.70 cm 1.90 cm/m   RA Area:     10.10 cm LA Vol (A2C):   39.9 ml 20.48 ml/m  RA Volume:   23.00 ml  11.81 ml/m LA Vol (A4C):   27.1 ml 13.91 ml/m LA Biplane Vol: 35.8 ml 18.38 ml/m  AORTIC VALVE LVOT Vmax:   100.07 cm/s LVOT Vmean:  66.133 cm/s LVOT VTI:    0.145 m  AORTA Ao Root diam: 3.80 cm Ao Asc diam:  3.30 cm MITRAL VALVE MV Area (PHT): 4.96 cm    SHUNTS MV Decel Time: 153 msec    Systemic VTI:  0.15 m MV E velocity: 75.45 cm/s  Systemic Diam: 2.10 cm MV A velocity: 76.50 cm/s MV E/A ratio:  0.99 Ida Rogue MD Electronically signed by Ida Rogue MD Signature Date/Time: 09/10/2022/7:28:09 AM    Final    PERIPHERAL VASCULAR CATHETERIZATION  Result Date: 09/08/2022 See surgical note for result.  CT Angio Chest PE W and/or Wo Contrast  Result Date: 09/07/2022 CLINICAL DATA:  Stage IV ureteral carcinoma. Hypotension and syncope. * Tracking Code: BO * EXAM: CT ANGIOGRAPHY CHEST WITH CONTRAST TECHNIQUE: Multidetector CT imaging of the chest was performed using the standard protocol during bolus administration of intravenous contrast. Multiplanar CT image reconstructions and MIPs were obtained to evaluate the vascular anatomy. RADIATION DOSE REDUCTION: This exam was performed according to the departmental dose-optimization program which includes automated exposure control, adjustment of the mA and/or kV according to patient size and/or use of iterative reconstruction technique. CONTRAST:  131mL OMNIPAQUE IOHEXOL 350 MG/ML SOLN COMPARISON:  PET-CT 08/14/2022.  Chest CT 08/05/2022 noncontrast FINDINGS: Cardiovascular: Heart is nonenlarged. Trace pericardial fluid. Right upper chest port in place. The port is accessed. In the right lower lobe is a filling defect along the medial branch of the right lower lobe pulmonary  artery consistent with a subsegmental pulmonary embolism. No other larger and more central  emboli are identified. Mediastinum/Nodes: No specific abnormal lymph node enlargement identified in the axillary regions. There are several abnormal mediastinal and hilar lymph nodes once again identified. These are increased from the study of 08/05/2022. Example to the left of the main pulmonary artery on series 4, image 67 measures 2.3 x 1.7 cm. Right hilar node anteriorly on series 4, image 68 measures 15 by 12 mm. Left hilar node on image 80 of series 4 measures 16 by 11 mm. Several other nodes are identified. There are also some prominent nodes along the thoracic inlet on the left side. All of these are also larger than the PET-CT scan of 08/14/22. Normal caliber thoracic esophagus. Lungs/Pleura: No pneumothorax or consolidation. Trace right-sided pleural fluid. Once again there are numerous bilateral lung nodules of varying size but the vast majority are under a cm. At least 15 nodules are identified. There is 1 lesion along the right lower lobe on image 95 of series 6 which is cavitary measuring 2.3 by 1.9 cm. Example nodule in the left lung, lower lobe on series 6, image 97 measures 12 by 10 mm. All of these are increased in size and number. Upper Abdomen: There are multiple perinephric space nodules involving the right kidney as well as poor visualization of the parenchyma of the right kidney consistent with known mass lesions. There is also nodular enlargement of the adrenal glands which also appears to have increased. Musculoskeletal: Mild degenerative changes along the spine. Review of the MIP images confirms the above findings. Critical Value/emergent results were called by telephone at the time of interpretation on 09/07/2022 at 2:07 pm to provider Dr. Joni Fears, who verbally acknowledged these results. IMPRESSION: Progression of metastatic disease. This includes lung nodules, mediastinal lymph nodes and upper abdominal lesions. Right lower lobe pulmonary emboli. Please see separate dictation of abdomen  and pelvis CT from same day Electronically Signed   By: Jill Side M.D.   On: 09/07/2022 17:21   CT ABDOMEN PELVIS W CONTRAST  Result Date: 09/07/2022 CLINICAL DATA:  Epigastric pain again leg swelling. 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 * Tracking Code: BO * EXAM: CT ABDOMEN AND PELVIS WITH CONTRAST TECHNIQUE: Multidetector CT imaging of the abdomen and pelvis was performed using the standard protocol following bolus administration of intravenous contrast. RADIATION DOSE REDUCTION: This exam was performed according to the departmental dose-optimization program which includes automated exposure control, adjustment of the mA and/or kV according to patient size and/or use of iterative reconstruction technique. CONTRAST:  117mL OMNIPAQUE IOHEXOL 350 MG/ML SOLN COMPARISON:  PET-CT 08/14/2022.  Standard CT 07/31/2022 FINDINGS: Lower chest: Please see separate dictation of chest CT angiogram from same date Hepatobiliary: Liver metastases are noted. Central lesion in the right hepatic lobe with some peripheral segmental biliary duct ectasia is now identified on series 2, image 15 measuring 2.2 x 1.6 cm. The duct dilatation is likely related to secondary mass effect. Small lesions are also seen peripherally in the right hepatic lobe on image 17 and 21 in segment 4. Portal vein is patent. Gallbladder is nondilated. Pancreas: Unremarkable. No pancreatic ductal dilatation or surrounding inflammatory changes. Spleen: Normal in size without focal abnormality. Adrenals/Urinary Tract: Increasing left adrenal nodule. On image 26 of series 2 this measures 17 x 15 mm. There is also an increasing right adrenal metastasis. Left kidney has some small Bosniak 1 cysts, unchanged from prior. No left-sided renal collecting system dilatation.  Left ureter has a normal course and caliber down to the bladder. Preserved contours of the urinary bladder. There is large infiltrative mass involving the right kidney along  its posterior aspect. Previously this measured 6.0 x 4.6 cm and on the current examination 6.5 by 4.4 cm. Once again there is ill-defined margins extending into the renal hilum collecting system. In addition there is marked increase in the number and extent of perinephric space nodules diffusely. Example superiorly on series 28 of series 2 measuring 15 by 14 mm. Previously this measured less than 5 mm. There is significant retroperitoneal stranding identified which is significantly increased from prior. Stomach/Bowel: Large bowel has a normal course and caliber. Scattered diverticula. No smaller large bowel dilatation. The stomach is underdistended. Vascular/Lymphatic: Normal caliber aorta with scattered vascular calcifications. There is significant increase in retroperitoneal abnormal lymph nodes. Example retrocaval on series 2 image 37 measures 4.7 x 2.7 cm. On the patient's prior PET-CT this measured 4.0 x 2.2 cm. There is several others which have increased as well and now cause significant compression of the IVC. Another example is a left para-aortic node which previously would have had dimensions of 11 mm in transverse and today 14 mm on series 2, image 36. Of note caudal to the area of narrowing of the IVC the veins are distended all the way into the area of the lower extremities. There is also edema of the right lower extremity. Please correlate with separate ultrasound of the legs for DVT from same day. Of note, 1 of the retrocaval mass lesions identified does appear to cause deformity of the adjacent vertebral body at L2 and extend into the vertebral body itself. Reproductive: Enlarged prostate with mass effect along the base of the bladder. Other: Anasarca.  Nonspecific presacral stranding. Musculoskeletal: Degenerative changes of the spine and pelvis. Pars defects again L5. Once again there is invasion of a retroperitoneal node into the margin of the vertebral body at L2 best seen on sagittal image 54  and axial image 38. Again new from previous examinations. Critical Value/emergent results were called by telephone at the time of interpretation on 09/07/2022 at 2:19 pm to provider Dr. Joni Fears, who verbally acknowledged these results. IMPRESSION: Marked increase of neoplasm metastatic disease including size of the primary right renal mass. Significant increase in size number perinephric nodules, bilateral adrenal masses, retroperitoneal nodes and liver lesions. The retroperitoneal lymphadenopathy does cause severe narrowing of the IVC focally and associated distention of caudal veins including the pelvis. Please correlate with separate DVT ultrasound. In addition 1 of the retroperitoneal nodes does appear to extend directly into and invade the L2 vertebral body. No bowel obstruction or free air. Scattered anasarca including along the retroperitoneum and presacral regions. Electronically Signed   By: Jill Side M.D.   On: 09/07/2022 17:21   US Venous Img Lower Right (DVT Study)  Result Date: 09/07/2022 CLINICAL DATA:  Acute right lower extremity pain and edema. EXAM: Right LOWER EXTREMITY VENOUS DOPPLER ULTRASOUND TECHNIQUE: Gray-scale sonography with graded compression, as well as color Doppler and duplex ultrasound were performed to evaluate the lower extremity deep venous systems from the level of the common femoral vein and including the common femoral, femoral, profunda femoral, popliteal and calf veins including the posterior tibial, peroneal and gastrocnemius veins when visible. The superficial great saphenous vein was also interrogated. Spectral Doppler was utilized to evaluate flow at rest and with distal augmentation maneuvers in the common femoral, femoral and popliteal veins. COMPARISON:  None Available.  FINDINGS: Contralateral Common Femoral Vein: Respiratory phasicity is normal and symmetric with the symptomatic side. No evidence of thrombus. Normal compressibility. Common Femoral Vein:  Noncompressible with no flow consistent with occlusive thrombus. Saphenofemoral Junction: Noncompressible with no flow consistent with occlusive thrombus. Profunda Femoral Vein: Noncompressible with no flow consistent with occlusive thrombus. Femoral Vein: Only partially compressible with possible rouleaux flow present. Popliteal Vein: Only partially compressible with possible rouleaux low flow present. Calf Veins: No evidence of thrombus. Normal compressibility and flow on color Doppler imaging. Superficial Great Saphenous Vein: No evidence of thrombus. Normal compressibility. Venous Reflux:  None. Other Findings: There also appears to be noncompressible thrombus in the visualized portions of the right common and external iliac veins. IMPRESSION: Occlusive deep venous thrombosis is noted in the right common femoral and profundus femoral veins as well as the right saphenofemoral junction. The right superficial femoral and popliteal veins are only partially compressible with probable rouleaux flow present. There also appears to be noncompressible thrombus within the visualized portions of the right common and external iliac veins. Electronically Signed   By: Marijo Conception M.D.   On: 09/07/2022 15:46   DG Chest Port 1 View  Result Date: 09/07/2022 CLINICAL DATA:  Provided history: Questionable sepsis. Evaluate for abnormality. EXAM: PORTABLE CHEST 1 VIEW COMPARISON:  PET-CT 08/14/2022. FINDINGS: Right chest infusion port catheter with tip at the level of the superior cavoatrial junction. Heart size within normal limits. Numerous bilateral pulmonary nodules consistent with known metastatic disease (as demonstrated on the recent prior PET-CT of 08/14/2022). No appreciable superimposed acute airspace consolidation. No evidence of pleural effusion or pneumothorax. No acute bony abnormality identified. Levocurvature of the thoracic spine. IMPRESSION: Numerous bilateral pulmonary nodules consistent with known  metastatic disease (as demonstrated on the recent prior PET-CT of 08/14/2022). No appreciable superimposed acute airspace consolidation. Electronically Signed   By: Kellie Simmering D.O.   On: 09/07/2022 14:23    Assessment and plan- Patient is a 67 y.o. male   Cough/CAP- Chest xray reviewed, possible infection. Given symptoms and his overall immunocompromised state, I reviewed with Dr Grayland Ormond who recommends antibiotic coverage. Start levaquin 500 mg daily x 5 days. Recommend use of flonase, 1 spray each nostril twice a day to help aerate the sinuses and reduce inflammation of the parasinuses. Can use otc cough medicine such as mucinex and robitussin. He'll receive IV fluids today as planned. If symptoms don't improve or worsen, rtc sooner than scheduled.  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 Received gemcitabine only on day 8 which he received on 10/01/22. Tolerating well.   Disposition:  Follow up with Dr. Grayland Ormond as scheduled or return to clinic if symptoms don't improve or worsen.  Visit Diagnosis 1. Acute cough   2. Community acquired pneumonia, unspecified laterality     Patient expressed understanding and was in agreement with this plan. He also understands that He can call clinic at any time with any questions, concerns, or complaints.   Thank you for allowing me to participate in the care of this very pleasant patient.   Beckey Rutter, DNP, AGNP-C, [facility]

## 2022-10-07 ENCOUNTER — Other Ambulatory Visit: Payer: BLUE CROSS/BLUE SHIELD

## 2022-10-07 ENCOUNTER — Ambulatory Visit: Payer: BLUE CROSS/BLUE SHIELD | Admitting: Oncology

## 2022-10-08 ENCOUNTER — Inpatient Hospital Stay: Payer: BLUE CROSS/BLUE SHIELD

## 2022-10-08 ENCOUNTER — Ambulatory Visit: Payer: BLUE CROSS/BLUE SHIELD

## 2022-10-08 VITALS — BP 119/84 | HR 106 | Temp 96.7°F | Resp 18

## 2022-10-08 DIAGNOSIS — Z95828 Presence of other vascular implants and grafts: Secondary | ICD-10-CM

## 2022-10-08 DIAGNOSIS — Z5111 Encounter for antineoplastic chemotherapy: Secondary | ICD-10-CM | POA: Diagnosis not present

## 2022-10-08 DIAGNOSIS — C642 Malignant neoplasm of left kidney, except renal pelvis: Secondary | ICD-10-CM

## 2022-10-08 DIAGNOSIS — R112 Nausea with vomiting, unspecified: Secondary | ICD-10-CM

## 2022-10-08 MED ORDER — SODIUM CHLORIDE 0.9 % IV SOLN: 8.0000 mg | Freq: Once | INTRAVENOUS | Status: DC

## 2022-10-08 MED ORDER — HEPARIN SOD (PORK) LOCK FLUSH 100 UNIT/ML IV SOLN
500.0000 [IU] | Freq: Once | INTRAVENOUS | Status: AC
  Administered 2022-10-08: 500 [IU]
  Filled 2022-10-08: qty 5

## 2022-10-08 MED ORDER — ONDANSETRON HCL 4 MG/2ML IJ SOLN
8.0000 mg | Freq: Once | INTRAMUSCULAR | Status: AC
  Administered 2022-10-08: 8 mg via INTRAVENOUS
  Filled 2022-10-08: qty 4

## 2022-10-08 MED ORDER — SODIUM CHLORIDE 0.9% FLUSH
10.0000 mL | Freq: Once | INTRAVENOUS | Status: AC | PRN
  Administered 2022-10-08: 10 mL
  Filled 2022-10-08: qty 10

## 2022-10-08 MED ORDER — SODIUM CHLORIDE 0.9 % IV SOLN
Freq: Once | INTRAVENOUS | Status: AC
  Filled 2022-10-08: qty 250

## 2022-10-08 NOTE — Progress Notes (Signed)
Nutrition Assessment:  Referral for Poor appetite  67 year old male with stage IV urothelial carcinoma of kidney.  Past medical history of HLD, HTN, DVT.  Patient receiving cisplatin and gemcitabine.  Met with patient and wife following fluids.  Patient reports no appetite and taste alterations.  Also reports pain at the site of cancer that effects ability to eat at times.  Has been drinking carnation instant breakfast mixed with milk, ice cream sandwiches. Tried equate shake and did not really like it.  Ate cereal this am.  Few bites of beef roast last night for dinner.  Says that nausea medication make nausea worse.  Has been taking reglan two times per day.     Medications: reviewed  Labs: reviewed  Anthropometrics:   Weight 151 lb 9.6 oz on 3/21 165 lb on 3/7 175 lb on 2/22 171 lb on 1/31 181 lb on 1/24  16% weight loss in the last 2 months, significant  NUTRITION DIAGNOSIS: Inadequate oral intake related to cancer and related treatment side effects as evidenced by 16% weight loss and poor po intake   INTERVENTION:  Discussed ways to add calories and protein in diet. Handout provided Samples of ensure complete and boost plus given for patient to try Encouraged small frequent nibbles q 2 hours  Consider appetite stimulant if intake does not improve Contact information provided    MONITORING, EVALUATION, GOAL: weight trends, intake   NEXT VISIT: phone call April 18   Jezelle Gullick B. Zenia Resides, [place], [place] Registered Dietitian [phone]

## 2022-10-09 ENCOUNTER — Telehealth: Payer: Self-pay | Admitting: *Deleted

## 2022-10-09 NOTE — Telephone Encounter (Signed)
Form signed and copied for chart. Original put in envelope at front desk and My Chart message sent to let patient know this

## 2022-10-09 NOTE — Telephone Encounter (Signed)
Received FMLA Form for Jeffrey Fernandez, form completed and sent for physician signature

## 2022-10-12 ENCOUNTER — Other Ambulatory Visit: Payer: Self-pay

## 2022-10-12 ENCOUNTER — Inpatient Hospital Stay: Payer: BLUE CROSS/BLUE SHIELD

## 2022-10-12 ENCOUNTER — Inpatient Hospital Stay: Payer: BLUE CROSS/BLUE SHIELD | Attending: Oncology

## 2022-10-12 ENCOUNTER — Encounter: Payer: Self-pay | Admitting: Hospice and Palliative Medicine

## 2022-10-12 ENCOUNTER — Encounter: Payer: Self-pay | Admitting: Oncology

## 2022-10-12 ENCOUNTER — Inpatient Hospital Stay (HOSPITAL_BASED_OUTPATIENT_CLINIC_OR_DEPARTMENT_OTHER): Payer: BLUE CROSS/BLUE SHIELD | Admitting: Hospice and Palliative Medicine

## 2022-10-12 VITALS — BP 111/72 | HR 129 | Temp 97.6°F | Resp 22 | Wt 142.3 lb

## 2022-10-12 DIAGNOSIS — R109 Unspecified abdominal pain: Secondary | ICD-10-CM

## 2022-10-12 DIAGNOSIS — R112 Nausea with vomiting, unspecified: Secondary | ICD-10-CM

## 2022-10-12 DIAGNOSIS — R110 Nausea: Secondary | ICD-10-CM

## 2022-10-12 DIAGNOSIS — D649 Anemia, unspecified: Secondary | ICD-10-CM | POA: Insufficient documentation

## 2022-10-12 DIAGNOSIS — Z66 Do not resuscitate: Secondary | ICD-10-CM | POA: Diagnosis not present

## 2022-10-12 DIAGNOSIS — R531 Weakness: Secondary | ICD-10-CM | POA: Diagnosis not present

## 2022-10-12 DIAGNOSIS — R5383 Other fatigue: Secondary | ICD-10-CM | POA: Insufficient documentation

## 2022-10-12 DIAGNOSIS — E860 Dehydration: Secondary | ICD-10-CM

## 2022-10-12 DIAGNOSIS — Z5112 Encounter for antineoplastic immunotherapy: Secondary | ICD-10-CM | POA: Diagnosis not present

## 2022-10-12 DIAGNOSIS — G893 Neoplasm related pain (acute) (chronic): Secondary | ICD-10-CM

## 2022-10-12 DIAGNOSIS — C7801 Secondary malignant neoplasm of right lung: Secondary | ICD-10-CM | POA: Insufficient documentation

## 2022-10-12 DIAGNOSIS — E871 Hypo-osmolality and hyponatremia: Secondary | ICD-10-CM | POA: Diagnosis not present

## 2022-10-12 DIAGNOSIS — Z5111 Encounter for antineoplastic chemotherapy: Secondary | ICD-10-CM | POA: Diagnosis present

## 2022-10-12 DIAGNOSIS — C642 Malignant neoplasm of left kidney, except renal pelvis: Secondary | ICD-10-CM

## 2022-10-12 DIAGNOSIS — R10A0 Flank pain, unspecified side: Secondary | ICD-10-CM

## 2022-10-12 DIAGNOSIS — C7802 Secondary malignant neoplasm of left lung: Secondary | ICD-10-CM | POA: Insufficient documentation

## 2022-10-12 DIAGNOSIS — R630 Anorexia: Secondary | ICD-10-CM | POA: Diagnosis not present

## 2022-10-12 DIAGNOSIS — Z95828 Presence of other vascular implants and grafts: Secondary | ICD-10-CM

## 2022-10-12 DIAGNOSIS — C641 Malignant neoplasm of right kidney, except renal pelvis: Secondary | ICD-10-CM | POA: Diagnosis present

## 2022-10-12 DIAGNOSIS — Z79620 Long term (current) use of immunosuppressive biologic: Secondary | ICD-10-CM | POA: Diagnosis not present

## 2022-10-12 LAB — CMP (CANCER CENTER ONLY)
ALT: 44 U/L (ref 0–44)
AST: 37 U/L (ref 15–41)
Albumin: 3 g/dL — ABNORMAL LOW (ref 3.5–5.0)
Alkaline Phosphatase: 248 U/L — ABNORMAL HIGH (ref 38–126)
Anion gap: 7 (ref 5–15)
BUN: 21 mg/dL (ref 8–23)
CO2: 27 mmol/L (ref 22–32)
Calcium: 11.1 mg/dL — ABNORMAL HIGH (ref 8.9–10.3)
Chloride: 93 mmol/L — ABNORMAL LOW (ref 98–111)
Creatinine: 1.13 mg/dL (ref 0.61–1.24)
GFR, Estimated: >60 mL/min (ref >60)
Glucose, Bld: 111 mg/dL — ABNORMAL HIGH (ref 70–99)
Potassium: 3.8 mmol/L (ref 3.5–5.1)
Sodium: 127 mmol/L — ABNORMAL LOW (ref 135–145)
Total Bilirubin: 0.5 mg/dL (ref 0.3–1.2)
Total Protein: 6.9 g/dL (ref 6.5–8.1)

## 2022-10-12 LAB — CBC WITH DIFFERENTIAL (CANCER CENTER ONLY)
Abs Immature Granulocytes: 0.32 10*3/uL — ABNORMAL HIGH (ref 0.00–0.07)
Basophils Absolute: 0.1 10*3/uL (ref 0.0–0.1)
Basophils Relative: 1 %
Eosinophils Absolute: 0.2 10*3/uL (ref 0.0–0.5)
Eosinophils Relative: 2 %
HCT: 27.8 % — ABNORMAL LOW (ref 39.0–52.0)
Hemoglobin: 9.3 g/dL — ABNORMAL LOW (ref 13.0–17.0)
Immature Granulocytes: 2 %
Lymphocytes Relative: 9 %
Lymphs Abs: 1.5 10*3/uL (ref 0.7–4.0)
MCH: 29.9 pg (ref 26.0–34.0)
MCHC: 33.5 g/dL (ref 30.0–36.0)
MCV: 89.4 fL (ref 80.0–100.0)
Monocytes Absolute: 1.9 10*3/uL — ABNORMAL HIGH (ref 0.1–1.0)
Monocytes Relative: 12 %
Neutro Abs: 12.2 10*3/uL — ABNORMAL HIGH (ref 1.7–7.7)
Neutrophils Relative %: 74 %
Platelet Count: 480 10*3/uL — ABNORMAL HIGH (ref 150–400)
RBC: 3.11 MIL/uL — ABNORMAL LOW (ref 4.22–5.81)
RDW: 15 % (ref 11.5–15.5)
WBC Count: 16.2 10*3/uL — ABNORMAL HIGH (ref 4.0–10.5)
nRBC: 0 % (ref 0.0–0.2)

## 2022-10-12 LAB — URINALYSIS, COMPLETE (UACMP) WITH MICROSCOPIC
Bilirubin Urine: NEGATIVE
Glucose, UA: NEGATIVE mg/dL
Hgb urine dipstick: NEGATIVE
Ketones, ur: NEGATIVE mg/dL
Leukocytes,Ua: NEGATIVE
Nitrite: NEGATIVE
Protein, ur: NEGATIVE mg/dL
Specific Gravity, Urine: 1.017 (ref 1.005–1.030)
pH: 5 (ref 5.0–8.0)

## 2022-10-12 LAB — MAGNESIUM: Magnesium: 1.8 mg/dL (ref 1.7–2.4)

## 2022-10-12 MED ORDER — SODIUM CHLORIDE 0.9 % IV SOLN
INTRAVENOUS | Status: DC
  Filled 2022-10-12 (×3): qty 250

## 2022-10-12 MED ORDER — SODIUM CHLORIDE 0.9% FLUSH
10.0000 mL | Freq: Once | INTRAVENOUS | Status: AC
  Administered 2022-10-12: 10 mL via INTRAVENOUS
  Filled 2022-10-12: qty 10

## 2022-10-12 MED ORDER — HEPARIN SOD (PORK) LOCK FLUSH 100 UNIT/ML IV SOLN
500.0000 [IU] | Freq: Once | INTRAVENOUS | Status: AC
  Administered 2022-10-12: 500 [IU]
  Filled 2022-10-12: qty 5

## 2022-10-12 MED ORDER — HYDROCODONE-ACETAMINOPHEN 5-325 MG PO TABS
1.0000 | ORAL_TABLET | Freq: Once | ORAL | Status: AC
  Administered 2022-10-12: 1 via ORAL
  Filled 2022-10-12: qty 1

## 2022-10-12 MED ORDER — ONDANSETRON HCL 4 MG/2ML IJ SOLN
8.0000 mg | Freq: Once | INTRAMUSCULAR | Status: AC
  Administered 2022-10-12: 8 mg via INTRAVENOUS

## 2022-10-12 NOTE — Progress Notes (Signed)
Symptom Management [place] at [facility] [phone] [phone]) [phone]  Patient Care Team: Birdie Sons, MD as PCP - General (Family Medicine) Billey Co, MD as Consulting Physician (Urology) Lloyd Huger, MD as Consulting Physician (Oncology)   NAME OF PATIENT: Jeffrey Fernandez  [id number]  1955/08/27   DATE OF VISIT: 10/12/22  REASON FOR CONSULT: Jeffrey Fernandez is a 67 y.o. 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  PET scan on 08/14/2022 shows hypermetabolic kidney mass with retroperitoneal lymphadenopathy and multiple bilateral pulmonary nodules.  INTERVAL HISTORY: Patient received day 1, cycle 2 cisplatin and Gemzar on 09/17/2022 and returned on 3/21 for day 8, cycle 2 Gemzar.  Patient was seen by Beckey Rutter, NP on 3/25 in [facility] for cough with chest x-ray concerning for possible infection.  Patient was started on antibiotic coverage with Levaquin x 5 days.  Patient presents [facility] today for evaluation of nausea and right abdominal pain.  Patient reports 24 to 48 hours of intermittent nausea and little lower right quadrant abdominal pain.  He reports pain is very similar to how he felt when his cancer was first diagnosed.  He denies dysuria, urinary frequency or urgency.  No testicular pain.  No noted hernias.  No abdominal distention, diarrhea or constipation.  He has had nausea without vomiting.  Patient has not been taking his antiemetics.  He did take 1 Norco with subsequent resolution of his pain.  Patient reports poor oral intake.  Denies any neurologic complaints. Denies recent fevers or illnesses. Denies any easy bleeding or bruising. Denies chest pain. Denies urinary complaints. Patient offers no further specific complaints today.  PAST MEDICAL HISTORY: Past Medical History:  Diagnosis Date   Cancer 2013   bladder   Diverticulitis    History of chickenpox     History of measles    History of mumps    Hyperlipidemia    Hypertension     PAST SURGICAL HISTORY:  Past Surgical History:  Procedure Laterality Date   HERNIA REPAIR  2002,2003   IR IMAGING GUIDED PORT INSERTION  08/25/2022   IVC VENOGRAPHY N/A 09/11/2022   Procedure: IVC Venography;  Surgeon: Katha Cabal, MD;  Location: [place] [facility];  Service: Cardiovascular;  Laterality: N/A;   PERIPHERAL VASCULAR THROMBECTOMY Right 09/08/2022   Procedure: PERIPHERAL VASCULAR THROMBECTOMY;  Surgeon: Katha Cabal, MD;  Location: [place] [facility];  Service: Cardiovascular;  Laterality: Right;   SIGMOIDOSCOPY  1998   [facility]   TRANSURETHRAL RESECTION OF BLADDER TUMOR  05/11/2012   Dr. Sherral Hammers, [facility]   VASECTOMY  2002   Bilateral inguinal herniorrhaphies    HEMATOLOGY/ONCOLOGY HISTORY:  Oncology History  Urothelial carcinoma of kidney  08/19/2022 Initial Diagnosis   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[place])   08/19/2022 Cancer Staging   Staging form: Kidney, AJCC 8th Edition - Clinical stage from 08/19/2022: Stage IV (cT4, cN1, cM1) - Signed by Lloyd Huger, MD on 08/19/2022   08/27/2022 -  Chemotherapy   Patient is on Treatment Plan : Urothelial carcinoma of kidney: Cisplatin D1 + Gemcitabine D1,8 q21d x 6 Cycles       ALLERGIES:  is allergic to amoxicillin, atenolol, other, oxycodone, and shellfish allergy.  MEDICATIONS:  Current Outpatient Medications  Medication Sig Dispense Refill   apixaban (ELIQUIS) 5 MG TABS tablet Take 1 tablet (5 mg total) by mouth 2 (two) times daily. 360 tablet 0  HYDROcodone-acetaminophen (NORCO) 5-325 MG tablet Take 1 tablet by mouth every 6 (six) hours as needed for moderate pain. 120 tablet 0   lidocaine-prilocaine (EMLA) cream Apply to affected area once 30 g 3   sildenafil (REVATIO) 20 MG tablet Take 1-3 tablets (20-60 mg total) by mouth daily as needed. 30 tablet 11   tamsulosin (FLOMAX) 0.4 MG CAPS capsule Take 1  capsule (0.4 mg total) by mouth daily. 30 capsule 11   acetaminophen (TYLENOL) 500 MG tablet Take 500 mg by mouth every 6 (six) hours as needed. (Patient not taking: Reported on 10/05/2022)     docusate sodium (COLACE) 250 MG capsule Take 250 mg by mouth daily. (Patient not taking: Reported on 10/05/2022)     fentaNYL (DURAGESIC) 25 MCG/HR Place 1 patch onto the skin every 3 (three) days. (Patient not taking: Reported on 10/05/2022) 10 patch 0   metoCLOPramide (REGLAN) 10 MG tablet Take 1 tablet (10 mg total) by mouth 4 (four) times daily. (Patient not taking: Reported on 10/12/2022) 120 tablet 1   metoprolol succinate (TOPROL-XL) 25 MG 24 hr tablet TAKE 1 TABLET (25 MG TOTAL) BY MOUTH DAILY. (Patient not taking: Reported on 10/05/2022) 90 tablet 2   ondansetron (ZOFRAN) 8 MG tablet Take 1 tablet (8 mg total) by mouth every 8 (eight) hours as needed for nausea or vomiting. (Patient not taking: Reported on 10/05/2022) 90 tablet 3   ondansetron (ZOFRAN-ODT) 8 MG disintegrating tablet Take 8 mg by mouth every 8 (eight) hours as needed. (Patient not taking: Reported on 10/05/2022)     pantoprazole (PROTONIX) 40 MG tablet TAKE 1 TABLET BY MOUTH EVERY DAY (Patient not taking: Reported on 10/05/2022) 90 tablet 2   prochlorperazine (COMPAZINE) 10 MG tablet Take 1 tablet (10 mg total) by mouth every 6 (six) hours as needed (Nausea or vomiting). (Patient not taking: Reported on 10/05/2022) 60 tablet 2   traZODone (DESYREL) 50 MG tablet Take 0.5 tablets (25 mg total) by mouth at bedtime as needed for sleep. (Patient not taking: Reported on 10/05/2022) 30 tablet 0   No current facility-administered medications for this visit.    VITAL SIGNS: BP 111/72   Pulse (!) 129   Temp 97.6 F (36.4 C) (Tympanic)   Resp (!) 22   SpO2 99% Comment: room air There were no vitals filed for this visit.  Estimated body mass index is 22.39 kg/m as calculated from the following:   Height as of 09/24/22: 5\' 9"  (1.753 m).   Weight as  of 10/01/22: 151 lb 9.6 oz (68.8 kg).  LABS: CBC:    Component Value Date/Time   WBC 10.5 10/01/2022 0845   HGB 8.6 (L) 10/01/2022 0845   HGB 8.3 (L) 09/16/2022 1049   HGB 16.7 12/02/2021 1332   HCT 25.6 (L) 10/01/2022 0845   HCT 46.9 12/02/2021 1332   PLT 528 (H) 10/01/2022 0845   PLT 633 (H) 09/16/2022 1049   PLT 359 12/02/2021 1332   MCV 91.1 10/01/2022 0845   MCV 93 12/02/2021 1332   NEUTROABS 7.9 (H) 10/01/2022 0845   NEUTROABS 4.7 06/10/2020 0855   LYMPHSABS 0.9 10/01/2022 0845   LYMPHSABS 2.2 06/10/2020 0855   MONOABS 1.3 (H) 10/01/2022 0845   EOSABS 0.2 10/01/2022 0845   EOSABS 0.2 06/10/2020 0855   BASOSABS 0.1 10/01/2022 0845   BASOSABS 0.1 06/10/2020 0855   Comprehensive Metabolic Panel:    Component Value Date/Time   NA 129 (L) 10/01/2022 0845   NA 142 12/02/2021 1332  K 3.6 10/01/2022 0845   CL 96 (L) 10/01/2022 0845   CO2 26 10/01/2022 0845   BUN 15 10/01/2022 0845   BUN 12 12/02/2021 1332   CREATININE 1.13 10/01/2022 0845   GLUCOSE 143 (H) 10/01/2022 0845   CALCIUM 9.5 10/01/2022 0845   AST 27 10/01/2022 0845   ALT 24 10/01/2022 0845   ALKPHOS 140 (H) 10/01/2022 0845   BILITOT 0.5 10/01/2022 0845   PROT 6.7 10/01/2022 0845   PROT 7.7 12/02/2021 1332   ALBUMIN 2.9 (L) 10/01/2022 0845   ALBUMIN 4.7 12/02/2021 1332    RADIOGRAPHIC STUDIES: DG Chest 2 View  Result Date: 10/05/2022 CLINICAL DATA:  Cough- urothelial cancer metastatic to lung AND hx of PE. New cough x 1 week; eval for pneumonia EXAM: CHEST - 2 VIEW COMPARISON:  Radiograph 09/07/2022 FINDINGS: Chest port catheter tip overlies the distal SVC. Unchanged cardiomediastinal silhouette. There are diffuse nodular opacities in the lungs compatible with known metastatic disease, appears more prominent in size and number in comparison to recent radiograph. No large effusion or evidence of pneumothorax. No acute osseous abnormality. IMPRESSION: Diffuse nodular opacities consistent with known pulmonary  metastatic disease, appears more prominent in size and number in comparison to recent chest radiograph in February, could reflect progression of disease or a superimposed infectious/inflammatory process. Electronically Signed   By: Maurine Simmering M.D.   On: 10/05/2022 11:44    PERFORMANCE STATUS (ECOG) : 2 - Symptomatic, <50% confined to bed  Review of Systems Unless otherwise noted, a complete review of systems is negative.  Physical Exam General: NAD Cardiovascular: regular rate and rhythm Pulmonary: clear ant fields Abdomen: soft, nontender, + bowel sounds GU: no suprapubic tenderness Extremities: RLE edema, no joint deformities Skin: no rashes Neurological: Weakness but otherwise nonfocal  IMPRESSION/PLAN: Nausea-likely secondary to chemotherapy and cancer.  Recommend use of antiemetics.  Right flank pain -likely secondary to known urothelial cancer with recent CT showing large infiltrative mass involving the right kidney extending in the renal hilum and increase in number and size of perinephric space nodules and retroperitoneal stranding.  Benign exam.  Will send for UA/culture.  Pain improved/resolved with use of Norco.  Recommend continued pain meds.  Could consider imaging if pain worsens or other symptomatic complaints.  Hypercalcemia -likely secondary to dehydration.  Proceed with IV fluids today and can consider adding on Zometa to next treatment if needed.  Poor oral intake -patient agreeable to trial of mirtazapine.  Hold trazodone.  Discussed high-calorie/high-protein foods and recommended oral nutritional supplements.  Patient followed by nutrition.  Will bring patient back in 2 to 3 days for recheck labs (hypercalcemia) and consideration of more fluids/supportive care  Case and plan discussed with Dr. Grayland Ormond  Patient expressed understanding and was in agreement with this plan. He also understands that He can call clinic at any time with any questions, concerns, or  complaints.   Thank you for allowing me to participate in the care of this very pleasant patient.   Time Total: 15 minutes  Visit consisted of counseling and education dealing with the complex and emotionally intense issues of symptom management in the setting of serious illness.Greater than 50%  of this time was spent counseling and coordinating care related to the above assessment and plan.  Signed by: Altha Harm, PhD, NP-C

## 2022-10-13 ENCOUNTER — Other Ambulatory Visit: Payer: Self-pay

## 2022-10-13 DIAGNOSIS — C642 Malignant neoplasm of left kidney, except renal pelvis: Secondary | ICD-10-CM

## 2022-10-13 LAB — URINE CULTURE: Culture: <10000 — AB

## 2022-10-14 ENCOUNTER — Other Ambulatory Visit: Payer: Self-pay

## 2022-10-14 ENCOUNTER — Emergency Department: Payer: BLUE CROSS/BLUE SHIELD

## 2022-10-14 ENCOUNTER — Inpatient Hospital Stay (HOSPITAL_BASED_OUTPATIENT_CLINIC_OR_DEPARTMENT_OTHER): Payer: BLUE CROSS/BLUE SHIELD | Admitting: Medical Oncology

## 2022-10-14 ENCOUNTER — Inpatient Hospital Stay: Payer: BLUE CROSS/BLUE SHIELD

## 2022-10-14 ENCOUNTER — Encounter: Payer: Self-pay | Admitting: Medical Oncology

## 2022-10-14 ENCOUNTER — Ambulatory Visit
Admission: RE | Admit: 2022-10-14 | Discharge: 2022-10-14 | Disposition: A | Discharge status: Home / Self Care | Payer: BLUE CROSS/BLUE SHIELD | Source: Ambulatory Visit | Attending: Medical Oncology | Admitting: Medical Oncology

## 2022-10-14 ENCOUNTER — Encounter: Payer: Self-pay | Admitting: Emergency Medicine

## 2022-10-14 ENCOUNTER — Inpatient Hospital Stay
Admission: EM | Admit: 2022-10-14 | Discharge: 2022-10-17 | DRG: 948 | Disposition: A | Discharge status: Home / Self Care | Payer: BLUE CROSS/BLUE SHIELD | Attending: Internal Medicine | Admitting: Internal Medicine

## 2022-10-14 VITALS — BP 107/76 | HR 128 | Temp 99.0°F

## 2022-10-14 VITALS — BP 101/63 | HR 102

## 2022-10-14 DIAGNOSIS — Z825 Family history of asthma and other chronic lower respiratory diseases: Secondary | ICD-10-CM

## 2022-10-14 DIAGNOSIS — Z833 Family history of diabetes mellitus: Secondary | ICD-10-CM

## 2022-10-14 DIAGNOSIS — C787 Secondary malignant neoplasm of liver and intrahepatic bile duct: Secondary | ICD-10-CM | POA: Diagnosis present

## 2022-10-14 DIAGNOSIS — I10 Essential (primary) hypertension: Secondary | ICD-10-CM | POA: Diagnosis not present

## 2022-10-14 DIAGNOSIS — C649 Malignant neoplasm of unspecified kidney, except renal pelvis: Secondary | ICD-10-CM | POA: Diagnosis present

## 2022-10-14 DIAGNOSIS — R079 Chest pain, unspecified: Principal | ICD-10-CM | POA: Diagnosis present

## 2022-10-14 DIAGNOSIS — Z86718 Personal history of other venous thrombosis and embolism: Secondary | ICD-10-CM

## 2022-10-14 DIAGNOSIS — D72829 Elevated white blood cell count, unspecified: Secondary | ICD-10-CM | POA: Diagnosis not present

## 2022-10-14 DIAGNOSIS — R6510 Systemic inflammatory response syndrome (SIRS) of non-infectious origin without acute organ dysfunction: Secondary | ICD-10-CM | POA: Diagnosis not present

## 2022-10-14 DIAGNOSIS — I700 Atherosclerosis of aorta: Secondary | ICD-10-CM | POA: Diagnosis present

## 2022-10-14 DIAGNOSIS — I82409 Acute embolism and thrombosis of unspecified deep veins of unspecified lower extremity: Secondary | ICD-10-CM | POA: Diagnosis present

## 2022-10-14 DIAGNOSIS — D849 Immunodeficiency, unspecified: Secondary | ICD-10-CM | POA: Diagnosis present

## 2022-10-14 DIAGNOSIS — Z8249 Family history of ischemic heart disease and other diseases of the circulatory system: Secondary | ICD-10-CM

## 2022-10-14 DIAGNOSIS — D6481 Anemia due to antineoplastic chemotherapy: Secondary | ICD-10-CM | POA: Diagnosis present

## 2022-10-14 DIAGNOSIS — N529 Male erectile dysfunction, unspecified: Secondary | ICD-10-CM | POA: Diagnosis present

## 2022-10-14 DIAGNOSIS — G893 Neoplasm related pain (acute) (chronic): Secondary | ICD-10-CM

## 2022-10-14 DIAGNOSIS — G4700 Insomnia, unspecified: Secondary | ICD-10-CM | POA: Diagnosis present

## 2022-10-14 DIAGNOSIS — R0602 Shortness of breath: Secondary | ICD-10-CM | POA: Diagnosis present

## 2022-10-14 DIAGNOSIS — D75839 Thrombocytosis, unspecified: Secondary | ICD-10-CM

## 2022-10-14 DIAGNOSIS — C7800 Secondary malignant neoplasm of unspecified lung: Secondary | ICD-10-CM | POA: Diagnosis present

## 2022-10-14 DIAGNOSIS — C641 Malignant neoplasm of right kidney, except renal pelvis: Secondary | ICD-10-CM

## 2022-10-14 DIAGNOSIS — C679 Malignant neoplasm of bladder, unspecified: Secondary | ICD-10-CM | POA: Diagnosis not present

## 2022-10-14 DIAGNOSIS — T451X5A Adverse effect of antineoplastic and immunosuppressive drugs, initial encounter: Secondary | ICD-10-CM | POA: Diagnosis present

## 2022-10-14 DIAGNOSIS — J439 Emphysema, unspecified: Secondary | ICD-10-CM | POA: Diagnosis present

## 2022-10-14 DIAGNOSIS — I2489 Other forms of acute ischemic heart disease: Secondary | ICD-10-CM | POA: Diagnosis present

## 2022-10-14 DIAGNOSIS — E785 Hyperlipidemia, unspecified: Secondary | ICD-10-CM | POA: Diagnosis present

## 2022-10-14 DIAGNOSIS — E871 Hypo-osmolality and hyponatremia: Secondary | ICD-10-CM

## 2022-10-14 DIAGNOSIS — Z823 Family history of stroke: Secondary | ICD-10-CM

## 2022-10-14 DIAGNOSIS — A419 Sepsis, unspecified organism: Secondary | ICD-10-CM | POA: Diagnosis not present

## 2022-10-14 DIAGNOSIS — R0902 Hypoxemia: Secondary | ICD-10-CM | POA: Diagnosis present

## 2022-10-14 DIAGNOSIS — F17210 Nicotine dependence, cigarettes, uncomplicated: Secondary | ICD-10-CM | POA: Diagnosis present

## 2022-10-14 DIAGNOSIS — K219 Gastro-esophageal reflux disease without esophagitis: Secondary | ICD-10-CM | POA: Diagnosis present

## 2022-10-14 DIAGNOSIS — Z8619 Personal history of other infectious and parasitic diseases: Secondary | ICD-10-CM

## 2022-10-14 DIAGNOSIS — X58XXXA Exposure to other specified factors, initial encounter: Secondary | ICD-10-CM | POA: Diagnosis present

## 2022-10-14 DIAGNOSIS — Z880 Allergy status to penicillin: Secondary | ICD-10-CM

## 2022-10-14 DIAGNOSIS — R748 Abnormal levels of other serum enzymes: Secondary | ICD-10-CM

## 2022-10-14 DIAGNOSIS — C642 Malignant neoplasm of left kidney, except renal pelvis: Secondary | ICD-10-CM

## 2022-10-14 DIAGNOSIS — Z1152 Encounter for screening for COVID-19: Secondary | ICD-10-CM

## 2022-10-14 DIAGNOSIS — R7989 Other specified abnormal findings of blood chemistry: Secondary | ICD-10-CM | POA: Diagnosis not present

## 2022-10-14 DIAGNOSIS — Z7901 Long term (current) use of anticoagulants: Secondary | ICD-10-CM

## 2022-10-14 DIAGNOSIS — Z79899 Other long term (current) drug therapy: Secondary | ICD-10-CM | POA: Diagnosis not present

## 2022-10-14 DIAGNOSIS — Z91013 Allergy to seafood: Secondary | ICD-10-CM

## 2022-10-14 DIAGNOSIS — K4090 Unilateral inguinal hernia, without obstruction or gangrene, not specified as recurrent: Secondary | ICD-10-CM | POA: Diagnosis present

## 2022-10-14 DIAGNOSIS — Z5112 Encounter for antineoplastic immunotherapy: Secondary | ICD-10-CM | POA: Diagnosis not present

## 2022-10-14 DIAGNOSIS — D649 Anemia, unspecified: Secondary | ICD-10-CM

## 2022-10-14 DIAGNOSIS — R058 Other specified cough: Secondary | ICD-10-CM

## 2022-10-14 DIAGNOSIS — I82401 Acute embolism and thrombosis of unspecified deep veins of right lower extremity: Secondary | ICD-10-CM | POA: Diagnosis not present

## 2022-10-14 LAB — CBC WITH DIFFERENTIAL/PLATELET
Abs Immature Granulocytes: 0.35 10*3/uL — ABNORMAL HIGH (ref 0.00–0.07)
Abs Immature Granulocytes: 0.23 10*3/uL — ABNORMAL HIGH (ref 0.00–0.07)
Basophils Absolute: 0.1 10*3/uL (ref 0.0–0.1)
Basophils Absolute: 0.1 10*3/uL (ref 0.0–0.1)
Basophils Relative: 1 %
Basophils Relative: 1 %
Eosinophils Absolute: 0.2 10*3/uL (ref 0.0–0.5)
Eosinophils Absolute: 0.1 10*3/uL (ref 0.0–0.5)
Eosinophils Relative: 1 %
Eosinophils Relative: 1 %
HCT: 27.4 % — ABNORMAL LOW (ref 39.0–52.0)
HCT: 28.9 % — ABNORMAL LOW (ref 39.0–52.0)
Hemoglobin: 8.9 g/dL — ABNORMAL LOW (ref 13.0–17.0)
Hemoglobin: 9.1 g/dL — ABNORMAL LOW (ref 13.0–17.0)
Immature Granulocytes: 2 %
Immature Granulocytes: 2 %
Lymphocytes Relative: 6 %
Lymphocytes Relative: 7 %
Lymphs Abs: 1 10*3/uL (ref 0.7–4.0)
Lymphs Abs: 1.1 10*3/uL (ref 0.7–4.0)
MCH: 29.6 pg (ref 26.0–34.0)
MCH: 29.2 pg (ref 26.0–34.0)
MCHC: 32.5 g/dL (ref 30.0–36.0)
MCHC: 31.5 g/dL (ref 30.0–36.0)
MCV: 91 fL (ref 80.0–100.0)
MCV: 92.6 fL (ref 80.0–100.0)
Monocytes Absolute: 1.9 10*3/uL — ABNORMAL HIGH (ref 0.1–1.0)
Monocytes Absolute: 1.2 10*3/uL — ABNORMAL HIGH (ref 0.1–1.0)
Monocytes Relative: 11 %
Monocytes Relative: 8 %
Neutro Abs: 13.9 10*3/uL — ABNORMAL HIGH (ref 1.7–7.7)
Neutro Abs: 12.9 10*3/uL — ABNORMAL HIGH (ref 1.7–7.7)
Neutrophils Relative %: 79 %
Neutrophils Relative %: 81 %
Platelets: 508 10*3/uL — ABNORMAL HIGH (ref 150–400)
Platelets: 469 10*3/uL — ABNORMAL HIGH (ref 150–400)
RBC: 3.01 MIL/uL — ABNORMAL LOW (ref 4.22–5.81)
RBC: 3.12 MIL/uL — ABNORMAL LOW (ref 4.22–5.81)
RDW: 15.2 % (ref 11.5–15.5)
RDW: 15.2 % (ref 11.5–15.5)
WBC: 17.4 10*3/uL — ABNORMAL HIGH (ref 4.0–10.5)
WBC: 15.6 10*3/uL — ABNORMAL HIGH (ref 4.0–10.5)
nRBC: 0 % (ref 0.0–0.2)
nRBC: 0 % (ref 0.0–0.2)

## 2022-10-14 LAB — COMPREHENSIVE METABOLIC PANEL
ALT: 40 U/L (ref 0–44)
ALT: 42 U/L (ref 0–44)
AST: 39 U/L (ref 15–41)
AST: 34 U/L (ref 15–41)
Albumin: 2.9 g/dL — ABNORMAL LOW (ref 3.5–5.0)
Albumin: 3.1 g/dL — ABNORMAL LOW (ref 3.5–5.0)
Alkaline Phosphatase: 321 U/L — ABNORMAL HIGH (ref 38–126)
Alkaline Phosphatase: 307 U/L — ABNORMAL HIGH (ref 38–126)
Anion gap: 7 (ref 5–15)
Anion gap: 8 (ref 5–15)
BUN: 22 mg/dL (ref 8–23)
BUN: 20 mg/dL (ref 8–23)
CO2: 29 mmol/L (ref 22–32)
CO2: 28 mmol/L (ref 22–32)
Calcium: 11.6 mg/dL — ABNORMAL HIGH (ref 8.9–10.3)
Calcium: 11.5 mg/dL — ABNORMAL HIGH (ref 8.9–10.3)
Chloride: 93 mmol/L — ABNORMAL LOW (ref 98–111)
Chloride: 93 mmol/L — ABNORMAL LOW (ref 98–111)
Creatinine, Ser: 1.17 mg/dL (ref 0.61–1.24)
Creatinine, Ser: 1.16 mg/dL (ref 0.61–1.24)
GFR, Estimated: >60 mL/min (ref >60)
GFR, Estimated: >60 mL/min (ref >60)
Glucose, Bld: 117 mg/dL — ABNORMAL HIGH (ref 70–99)
Glucose, Bld: 97 mg/dL (ref 70–99)
Potassium: 4.2 mmol/L (ref 3.5–5.1)
Potassium: 4.2 mmol/L (ref 3.5–5.1)
Sodium: 129 mmol/L — ABNORMAL LOW (ref 135–145)
Sodium: 129 mmol/L — ABNORMAL LOW (ref 135–145)
Total Bilirubin: 0.4 mg/dL (ref 0.3–1.2)
Total Bilirubin: 0.5 mg/dL (ref 0.3–1.2)
Total Protein: 6.8 g/dL (ref 6.5–8.1)
Total Protein: 7.3 g/dL (ref 6.5–8.1)

## 2022-10-14 LAB — MAGNESIUM: Magnesium: 2 mg/dL (ref 1.7–2.4)

## 2022-10-14 LAB — TROPONIN I (HIGH SENSITIVITY)
Troponin I (High Sensitivity): 52 ng/L — ABNORMAL HIGH (ref <18)
Troponin I (High Sensitivity): 53 ng/L — ABNORMAL HIGH (ref <18)
Troponin I (High Sensitivity): 50 ng/L — ABNORMAL HIGH (ref <18)

## 2022-10-14 LAB — URINALYSIS, W/ REFLEX TO CULTURE (INFECTION SUSPECTED)
Bacteria, UA: NONE SEEN
Bilirubin Urine: NEGATIVE
Glucose, UA: NEGATIVE mg/dL
Ketones, ur: NEGATIVE mg/dL
Leukocytes,Ua: NEGATIVE
Nitrite: NEGATIVE
Protein, ur: 30 mg/dL — AB
RBC / HPF: >50 RBC/hpf (ref 0–5)
Specific Gravity, Urine: 1.032 — ABNORMAL HIGH (ref 1.005–1.030)
pH: 5 (ref 5.0–8.0)

## 2022-10-14 LAB — PROTIME-INR
INR: 1.2 (ref 0.8–1.2)
Prothrombin Time: 15.2 seconds (ref 11.4–15.2)

## 2022-10-14 LAB — LACTIC ACID, PLASMA
Lactic Acid, Venous: 1.2 mmol/L (ref 0.5–1.9)
Lactic Acid, Venous: 1.1 mmol/L (ref 0.5–1.9)

## 2022-10-14 LAB — SARS CORONAVIRUS 2 BY RT PCR: SARS Coronavirus 2 by RT PCR: NEGATIVE

## 2022-10-14 LAB — PROCALCITONIN: Procalcitonin: 0.35 ng/mL

## 2022-10-14 LAB — APTT: aPTT: 30 seconds (ref 24–36)

## 2022-10-14 MED ORDER — SODIUM CHLORIDE 0.9 % IV SOLN
500.0000 mg | INTRAVENOUS | Status: DC
  Administered 2022-10-14: 500 mg via INTRAVENOUS
  Filled 2022-10-14: qty 5

## 2022-10-14 MED ORDER — IPRATROPIUM-ALBUTEROL 0.5-2.5 (3) MG/3ML IN SOLN
3.0000 mL | Freq: Four times a day (QID) | RESPIRATORY_TRACT | Status: DC | PRN
  Administered 2022-10-15: 3 mL via RESPIRATORY_TRACT
  Filled 2022-10-14: qty 3

## 2022-10-14 MED ORDER — METRONIDAZOLE 500 MG/100ML IV SOLN: 500.0000 mg | Freq: Two times a day (BID) | INTRAVENOUS | Status: DC

## 2022-10-14 MED ORDER — TRAZODONE HCL 50 MG PO TABS
25.0000 mg | ORAL_TABLET | Freq: Every evening | ORAL | Status: DC | PRN
  Filled 2022-10-14: qty 1

## 2022-10-14 MED ORDER — TAMSULOSIN HCL 0.4 MG PO CAPS
0.4000 mg | ORAL_CAPSULE | Freq: Every day | ORAL | Status: DC
  Administered 2022-10-15 – 2022-10-17 (×3): 0.4 mg via ORAL
  Filled 2022-10-14 (×3): qty 1

## 2022-10-14 MED ORDER — APIXABAN 5 MG PO TABS
5.0000 mg | ORAL_TABLET | Freq: Two times a day (BID) | ORAL | Status: DC
  Administered 2022-10-14 – 2022-10-17 (×6): 5 mg via ORAL
  Filled 2022-10-14 (×6): qty 1

## 2022-10-14 MED ORDER — LIDOCAINE 5 % EX PTCH
1.0000 | MEDICATED_PATCH | Freq: Every day | CUTANEOUS | Status: DC | PRN
  Administered 2022-10-14 – 2022-10-16 (×2): 1 via TRANSDERMAL
  Filled 2022-10-14 (×4): qty 1

## 2022-10-14 MED ORDER — HYDROCOD POLI-CHLORPHE POLI ER 10-8 MG/5ML PO SUER: 5.0000 mL | Freq: Every evening | ORAL | Status: AC | PRN

## 2022-10-14 MED ORDER — METOPROLOL SUCCINATE ER 25 MG PO TB24
25.0000 mg | ORAL_TABLET | Freq: Every day | ORAL | Status: DC
  Administered 2022-10-15 – 2022-10-17 (×3): 25 mg via ORAL
  Filled 2022-10-14 (×3): qty 1

## 2022-10-14 MED ORDER — SODIUM CHLORIDE 0.9% FLUSH
10.0000 mL | Freq: Once | INTRAVENOUS | Status: AC
  Administered 2022-10-14: 10 mL via INTRAVENOUS
  Filled 2022-10-14: qty 10

## 2022-10-14 MED ORDER — VANCOMYCIN HCL 1250 MG/250ML IV SOLN
1250.0000 mg | INTRAVENOUS | Status: DC
  Filled 2022-10-14: qty 250

## 2022-10-14 MED ORDER — ONDANSETRON HCL 4 MG/2ML IJ SOLN
4.0000 mg | Freq: Once | INTRAMUSCULAR | Status: AC
  Administered 2022-10-14: 4 mg via INTRAVENOUS
  Filled 2022-10-14: qty 2

## 2022-10-14 MED ORDER — METRONIDAZOLE 500 MG/100ML IV SOLN
500.0000 mg | Freq: Once | INTRAVENOUS | Status: AC
  Administered 2022-10-14: 500 mg via INTRAVENOUS
  Filled 2022-10-14: qty 100

## 2022-10-14 MED ORDER — ACETAMINOPHEN 325 MG PO TABS
650.0000 mg | ORAL_TABLET | Freq: Four times a day (QID) | ORAL | Status: DC | PRN
  Administered 2022-10-14: 650 mg via ORAL
  Filled 2022-10-14: qty 2

## 2022-10-14 MED ORDER — IPRATROPIUM-ALBUTEROL 0.5-2.5 (3) MG/3ML IN SOLN
3.0000 mL | Freq: Three times a day (TID) | RESPIRATORY_TRACT | Status: AC
  Administered 2022-10-14 (×2): 3 mL via RESPIRATORY_TRACT
  Filled 2022-10-14 (×2): qty 3

## 2022-10-14 MED ORDER — DOCUSATE SODIUM 50 MG PO CAPS: 250.0000 mg | ORAL_CAPSULE | Freq: Two times a day (BID) | ORAL | Status: DC | PRN

## 2022-10-14 MED ORDER — HYDROCODONE-ACETAMINOPHEN 5-325 MG PO TABS
1.0000 | ORAL_TABLET | Freq: Four times a day (QID) | ORAL | Status: DC | PRN
  Administered 2022-10-14 – 2022-10-17 (×6): 1 via ORAL
  Filled 2022-10-14 (×6): qty 1

## 2022-10-14 MED ORDER — IOHEXOL 350 MG/ML SOLN
75.0000 mL | Freq: Once | INTRAVENOUS | Status: AC | PRN
  Administered 2022-10-14: 50 mL via INTRAVENOUS

## 2022-10-14 MED ORDER — MORPHINE SULFATE (PF) 4 MG/ML IV SOLN
4.0000 mg | INTRAVENOUS | Status: AC | PRN
  Administered 2022-10-14 – 2022-10-16 (×4): 4 mg via INTRAVENOUS
  Filled 2022-10-14 (×4): qty 1

## 2022-10-14 MED ORDER — LACTATED RINGERS IV SOLN: INTRAVENOUS | Status: AC

## 2022-10-14 MED ORDER — SODIUM CHLORIDE 0.9 % IV SOLN
2.0000 g | Freq: Two times a day (BID) | INTRAVENOUS | Status: DC
  Administered 2022-10-14 – 2022-10-15 (×2): 2 g via INTRAVENOUS
  Filled 2022-10-14 (×2): qty 12.5

## 2022-10-14 MED ORDER — ACETAMINOPHEN 650 MG RE SUPP: 650.0000 mg | Freq: Four times a day (QID) | RECTAL | Status: DC | PRN

## 2022-10-14 MED ORDER — HEPARIN SOD (PORK) LOCK FLUSH 100 UNIT/ML IV SOLN
500.0000 [IU] | Freq: Once | INTRAVENOUS | Status: AC
  Filled 2022-10-14: qty 5

## 2022-10-14 MED ORDER — VANCOMYCIN HCL 1250 MG/250ML IV SOLN
1250.0000 mg | Freq: Once | INTRAVENOUS | Status: AC
  Administered 2022-10-14: 1250 mg via INTRAVENOUS
  Filled 2022-10-14: qty 250

## 2022-10-14 MED ORDER — SODIUM CHLORIDE 0.9 % IV BOLUS
1000.0000 mL | Freq: Once | INTRAVENOUS | Status: AC
  Administered 2022-10-14: 1000 mL via INTRAVENOUS

## 2022-10-14 MED ORDER — ONDANSETRON HCL 4 MG PO TABS
4.0000 mg | ORAL_TABLET | Freq: Four times a day (QID) | ORAL | Status: DC | PRN
  Administered 2022-10-15 – 2022-10-16 (×4): 4 mg via ORAL
  Filled 2022-10-14 (×4): qty 1

## 2022-10-14 MED ORDER — SODIUM CHLORIDE 0.9 % IV SOLN
Freq: Once | INTRAVENOUS | Status: AC
  Filled 2022-10-14: qty 250

## 2022-10-14 MED ORDER — SODIUM CHLORIDE 0.9 % IV SOLN
2.0000 g | Freq: Once | INTRAVENOUS | Status: AC
  Administered 2022-10-14: 2 g via INTRAVENOUS
  Filled 2022-10-14: qty 12.5

## 2022-10-14 MED ORDER — FENTANYL 25 MCG/HR TD PT72: 1.0000 | MEDICATED_PATCH | TRANSDERMAL | Status: DC | PRN

## 2022-10-14 MED ORDER — ONDANSETRON HCL 4 MG/2ML IJ SOLN
4.0000 mg | Freq: Four times a day (QID) | INTRAMUSCULAR | Status: DC | PRN
  Administered 2022-10-14 – 2022-10-17 (×4): 4 mg via INTRAVENOUS
  Filled 2022-10-14 (×4): qty 2

## 2022-10-14 MED FILL — Fosaprepitant Dimeglumine For IV Infusion 150 MG (Base Eq): INTRAVENOUS | Qty: 5 | Status: AC

## 2022-10-14 MED FILL — Dexamethasone Sodium Phosphate Inj 100 MG/10ML: INTRAMUSCULAR | Qty: 1 | Status: AC

## 2022-10-14 NOTE — Progress Notes (Signed)
Pharmacy Antibiotic Note  Jeffrey Fernandez is a 67 y.o. male w/ PMH of urothelial carcinoma on chemotherapy, DVT on Eliquis, hypertension, hyperlipidemia admitted on 10/14/2022 with sepsis of unclear etiology.  Pharmacy has been consulted for cefepime and vancomycin dosing.  Plan:  1) vancomycin 1250 mg IV x 1 then 1250 mg IV every 24 hours Goal AUC 400-550. Expected AUC: 471.4 SCr used: 1.16 mg/dL Ke 0.017 h-1, T1/2 12.1 h  2) start cefepime 2 grams IV every 12 hours --follow renal function for needed dose adjustments  Height: 5\' 9"  (175.3 cm) Weight: 64.5 kg (142 lb 4.8 oz) IBW/kg (Calculated) : 70.7  Temp (24hrs), Avg:98.5 F (36.9 C), Min:98 F (36.7 C), Max:99 F (37.2 C)  Recent Labs  Lab 10/12/22 1307 10/14/22 1008 10/14/22 1237 10/14/22 1415  WBC 16.2* 17.4* 15.6*  --   CREATININE 1.13 1.17 1.16  --   LATICACIDVEN  --   --  1.2 1.1    Estimated Creatinine Clearance: 57.9 mL/min (by C-G formula based on SCr of 1.16 mg/dL).    Allergies  Allergen Reactions   Amoxicillin Itching   Atenolol Diarrhea    Other reaction(s): HIVES   Other Nausea And Vomiting    [employer]ans   Oxycodone     Other reaction(s): HIVES   Shellfish Allergy Itching and Swelling    Antimicrobials this admission: 04/03 vancomycin >>  04/03 metronidazole >> 04/03 cefepime >>   Microbiology results: 04/03 BCx: pending 04/01 UCx: insignificant growth   Thank you for allowing pharmacy to be a part of this patient's care.  Dallie Piles 10/14/2022 3:00 PM

## 2022-10-14 NOTE — Assessment & Plan Note (Signed)
I suspect this is demand ischemia in setting of patient meeting criteria for SIRS Reassuring that the elevated high sensitive troponin is downtrending Continue to monitor

## 2022-10-14 NOTE — Assessment & Plan Note (Signed)
Trazodone 25 mg nightly as needed for sleep resumed

## 2022-10-14 NOTE — ED Triage Notes (Signed)
First nurse note: [facility] to ED [facility] from [facility]. Pt was seen and sent for a CTA and had labs drawn after reporting CP that started last night. NP reports first troponin 52

## 2022-10-14 NOTE — Assessment & Plan Note (Signed)
Treat per above with broad-spectrum antibiotic, repeat CBC in the a.m.

## 2022-10-14 NOTE — Assessment & Plan Note (Addendum)
Patient endorses compliance with home anticoagulation, Eliquis Home Eliquis has been resumed

## 2022-10-14 NOTE — H&P (Signed)
History and Physical   Jeffrey Fernandez [id number] DOB: [birth year] DOA: 10/14/2022  PCP: Birdie Sons, MD  Outpatient Specialists: Dr. Grayland Ormond, medical oncology Patient coming from: Outpatient cancer center via [place]  I have personally briefly reviewed patient's old medical records in [place].  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  Vitals in the ED showed temperature of 98, respiration rate of 18, heart rate 128, blood pressure 107/76, improved to 146/86, SpO2 of 97% on room air and then desatted to 87%, requiring 2 L nasal cannula with improvement to 97%.  Serum sodium is 129, potassium 4.2, chloride 93, bicarb 28, BUN of 20, serum creatinine 1.16, EGFR greater than 60, nonfasting blood glucose 97, WBC 15.6, hemoglobin 9.1, platelets of 469.  High sensitive troponin was initially 52 and on repeat was 53.  Lactic acid is initially 1.2 and on repeat is 1.1.  Alk phos is elevated at 307.  Magnesium level was 2.0.  COVID PCR is in process.  Blood cultures x 2 are collected and in process.  ED treatment: Cefepime 2 g IV, Flagyl 500 mg IV one-time dose, vancomycin per pharmacy, sodium chloride 2 L bolus, ondansetron 4 mg IV one-time dose. --------------------------------- At bedside, he was able to tell me his name, age, current year.  He does not appear to be in acute distress.  He reports he has been having productive cough of milky/white sputum for two weeks. He developed chest pain two days ago that is worse with the cough.   He was prescribed levofloxacin bid, and complete course 5 day course on Friday, 10/09/22.  He endorseing right inguinal and suprapubic tenderness with  palpation that started over the last few days as well. These pains are not related to coughing and is positional.  He denies dysuria, hematuria, diarrhea, vomiting. Denies fever, trauma to his person, syncope or loss of consciousness.  He endorses nausea.   Social history: He lives at home with his wife and kids. He denies tobacco use. He quit smoking in 2011. He denies etoh use. He denies recreational drug use. 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  ROS: Constitutional: no weight change, no fever ENT/Mouth: no sore throat, no rhinorrhea Eyes: no eye pain, no vision changes Cardiovascular: + chest pain, no dyspnea,  no edema, no palpitations Respiratory: + cough, + sputum, no wheezing Gastrointestinal: + nausea, no vomiting, no diarrhea, no constipation Genitourinary: no urinary incontinence, no dysuria, no hematuria Musculoskeletal: no arthralgias, no myalgias Skin: no skin lesions, no pruritus, Neuro: + weakness, no loss of consciousness, no syncope Psych: no anxiety, no depression, + decrease appetite Heme/Lymph: no bruising, no bleeding  ED Course: Discussed with emergency medicine writer, patient requiring hospitalization for chief concerns of possible sepsis.  Assessment/Plan  Principal Problem:   SIRS (systemic inflammatory response syndrome) Active Problems:   Essential (primary) hypertension   Hyperlipidemia   ED (erectile dysfunction) of organic origin   Gastroesophageal reflux disease   Aortic atherosclerosis   Emphysema of lung   Urothelial carcinoma of kidney   DVT (deep venous thrombosis)   Chronic hyponatremia   Insomnia   Leukocytosis   Elevated troponin   Cancer associated pain   Assessment and Plan:  *  SIRS (systemic inflammatory response syndrome) Etiology workup in progress at this time, possible commune acquired pneumonia/atypical pneumonia Blood cultures x 2 are in process Check procalcitonin, MRSA PCR screening Continue broad-spectrum  antibiotic for unknown source at this time including vancomycin, cefepime per pharmacy and metronidazole 500 mg IV twice daily Procalcitonin was elevated, added azithromycin for atypical coverage Given that CT abdomen pelvis was negative for acute intra-abdominal signs of infection, metronidazole 500 mg IV twice daily has been discontinued Recheck procalcitonin in the a.m. to ensure antibiotic effectiveness DuoNebs, 2 treatments scheduled for day of admission DuoNebs every 6 hours as needed for shortness of breath and wheezing ordered for 10/15/2022 Admit to PCU, inpatient  Cancer associated pain I suspect suprapubic pain, right groin, right low back pain is cancer associated pain given CT read of enlarging prostate with mass effect into the bladder, enlarging right sided renal mass, increasing liver metastasis  Elevated troponin I suspect this is demand ischemia in setting of patient meeting criteria for SIRS Reassuring that the elevated high sensitive troponin is downtrending Continue to monitor  Leukocytosis Treat per above with broad-spectrum antibiotic, repeat CBC in the a.m.  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  DVT (deep venous thrombosis) Patient endorses compliance with home anticoagulation, Eliquis Home Eliquis has been resumed  Essential (primary) hypertension Metoprolol succinate 25 mg daily resumed 10/15/2022  Chart reviewed.   DVT prophylaxis: Eliquis 5 mg p.o. twice daily Code Status: full code Diet: Heart healthy Family Communication: updated family, wife and daughter at bedside Disposition Plan: Pending  clinical course Consults called: none at this time Admission status: PCU, inpatient  Past Medical History:  Diagnosis Date   Cancer 2013   bladder   Diverticulitis    History of chickenpox    History of measles    History of mumps    Hyperlipidemia    Hypertension    Past Surgical History:  Procedure Laterality Date   HERNIA REPAIR  2002,2003   IR IMAGING GUIDED PORT INSERTION  08/25/2022   IVC VENOGRAPHY N/A 09/11/2022   Procedure: IVC Venography;  Surgeon: Katha Cabal, MD;  Location: [facility];  Service: Cardiovascular;  Laterality: N/A;   PERIPHERAL VASCULAR THROMBECTOMY Right 09/08/2022   Procedure: PERIPHERAL VASCULAR THROMBECTOMY;  Surgeon: Katha Cabal, MD;  Location: [facility];  Service: Cardiovascular;  Laterality: Right;   SIGMOIDOSCOPY  1998   [facility]   TRANSURETHRAL RESECTION OF BLADDER TUMOR  05/11/2012   Dr. Sherral Hammers, [facility]   VASECTOMY  2002   Bilateral inguinal herniorrhaphies   Social History:  reports that he quit smoking about 11 years ago. His smoking use included cigarettes. He has a 78.00 pack-year smoking history. He has been exposed to tobacco smoke. He has never used smokeless tobacco. He reports current alcohol use. He reports that he does not use drugs.  Allergies  Allergen Reactions   Amoxicillin Itching   Atenolol Diarrhea    Other reaction(s): HIVES   Other Nausea And Vomiting    [employer]ans   Oxycodone     Other reaction(s): HIVES   Shellfish Allergy Itching and  Swelling   Family History  Problem Relation Age of Onset   COPD Mother    Diverticulosis Mother    Diverticulosis Father    Diabetes Father    Hypertension Father    Diverticulosis Sister    Stroke Paternal Grandmother    Family history: Family history reviewed and not pertinent.  Prior to Admission medications   Medication Sig Start Date End Date Taking? Authorizing Provider  acetaminophen (TYLENOL) 500 MG tablet Take 500 mg  by mouth every 6 (six) hours as needed. Patient not taking: Reported on 10/05/2022    [provider]  apixaban (ELIQUIS) 5 MG TABS tablet Take 1 tablet (5 mg total) by mouth 2 (two) times daily. 09/19/22   Verline Lema, MD  docusate sodium (COLACE) 250 MG capsule Take 250 mg by mouth daily. Patient not taking: Reported on 10/05/2022    [provider]  HYDROcodone-acetaminophen (NORCO) 5-325 MG tablet Take 1 tablet by mouth every 6 (six) hours as needed for moderate pain. 08/05/22   Lloyd Huger, MD  lidocaine-prilocaine (EMLA) cream Apply to affected area once 08/19/22   Lloyd Huger, MD  metoCLOPramide (REGLAN) 10 MG tablet Take 1 tablet (10 mg total) by mouth 4 (four) times daily. Patient not taking: Reported on 10/12/2022 10/01/22   Lloyd Huger, MD  metoprolol succinate (TOPROL-XL) 25 MG 24 hr tablet TAKE 1 TABLET (25 MG TOTAL) BY MOUTH DAILY. Patient not taking: Reported on 10/05/2022 01/29/22   Birdie Sons, MD  ondansetron (ZOFRAN) 8 MG tablet Take 1 tablet (8 mg total) by mouth every 8 (eight) hours as needed for nausea or vomiting. Patient not taking: Reported on 10/05/2022 08/21/22   Borders, Kirt Boys, NP  ondansetron (ZOFRAN-ODT) 8 MG disintegrating tablet Take 8 mg by mouth every 8 (eight) hours as needed. Patient not taking: Reported on 10/05/2022 08/21/22   [provider]  pantoprazole (PROTONIX) 40 MG tablet TAKE 1 TABLET BY MOUTH EVERY DAY Patient not taking: Reported on 10/05/2022 01/29/22   Birdie Sons, MD  prochlorperazine (COMPAZINE) 10 MG tablet Take 1 tablet (10 mg total) by mouth every 6 (six) hours as needed (Nausea or vomiting). Patient not taking: Reported on 10/05/2022 08/19/22   Lloyd Huger, MD  sildenafil (REVATIO) 20 MG tablet Take 1-3 tablets (20-60 mg total) by mouth daily as needed. 12/24/21   Billey Co, MD  tamsulosin (FLOMAX) 0.4 MG CAPS capsule Take 1 capsule (0.4 mg total) by mouth daily. 07/28/22   Billey Co, MD  traZODone (DESYREL) 50 MG tablet Take 0.5 tablets (25 mg total) by mouth at bedtime as needed for sleep. Patient not taking: Reported on 10/05/2022 09/12/22   Verline Lema, MD   Physical Exam: Vitals:   10/14/22 1600 10/14/22 1700 10/14/22 1800 10/14/22 1830  BP: (!) 143/88 (!) 154/88 135/86 (!) 140/88  Pulse: 98 (!) 102 (!) 116 (!) 110  Resp: 18 (!) 29 (!) 23 (!) 24  Temp: 98.1 F (36.7 C)     TempSrc:      SpO2: 98% 99% 100% 98%  Weight:      Height:       Constitutional: appears frail, NAD, calm, comfortable Eyes: PERRL, lids and conjunctivae normal ENMT: Mucous membranes are moist. Posterior pharynx clear of any exudate or lesions. Age-appropriate dentition. Hearing appropriate Neck: normal, supple, no masses, no thyromegaly Respiratory: clear to auscultation bilaterally, no wheezing, no crackles. Normal respiratory effort. No accessory muscle use.  Cardiovascular:  Regular rate and rhythm, no murmurs / rubs / gallops. No extremity edema. 2+ pedal pulses. No carotid bruits.  Abdomen: Generalized abdomianl tenderness more in suprapubic and right groin, no masses palpated, no hepatosplenomegaly. Bowel sounds positive.  Musculoskeletal: no clubbing / cyanosis. No joint deformity upper and lower extremities. Good ROM, no contractures, no atrophy. Normal muscle tone.  Skin: no rashes, lesions, ulcers. No induration Neurologic: Sensation intact. Strength 5/5 in all 4.  Psychiatric: Normal judgment and insight. Alert and oriented x 3. Normal mood.   EKG: independently reviewed, showing sinus rhythm with rate of 99, QTc 401  Chest x-ray on Admission: I personally reviewed and I agree with radiologist reading as below.  CT ABDOMEN PELVIS WO CONTRAST  Result Date: 10/14/2022 CLINICAL DATA:  Metastatic urothelial cancer. Abdominal pain. * Tracking Code: BO * EXAM: CT ABDOMEN AND PELVIS WITHOUT CONTRAST TECHNIQUE: Multidetector CT imaging of the abdomen and pelvis was performed  following the standard protocol without IV contrast. RADIATION DOSE REDUCTION: This exam was performed according to the departmental dose-optimization program which includes automated exposure control, adjustment of the mA and/or kV according to patient size and/or use of iterative reconstruction technique. COMPARISON:  CT abdomen and pelvis 09/07/2022. Please see separate dictation of chest CT as well from earlier 10/14/2022 FINDINGS: Lower chest: Numerous pulmonary nodules again identified. These increased from previous. Again please correlate with chest CT scan. Hepatobiliary: On this non IV contrast exam, there is a heterogeneous mass centrally in the right hepatic lobe which is larger today. On series 2, image 19 this measures 4.9 by 4.1 cm and previously measured only 2.2 x 1.6 cm. Other smaller lesions are identified as well in the liver. Less well defined without the advantage of IV contrast. The gallbladder is nondilated. Pancreas: Unremarkable. No pancreatic ductal dilatation or surrounding inflammatory changes. Spleen: Normal in size without focal abnormality. Adrenals/Urinary Tract: Left adrenal nodule again seen. Previously 17 x 15 mm and today 17 x 13 mm, similar. Right adrenal nodule today measures 2.4 by 1.9 cm on series 2, image 31 and previously 15 x 11 mm. There is contrast in the renal collecting system from the prior CT scan of the chest. There is a large infiltrative mass involving the posterior aspect of the right kidney with several adjacent retroperitoneal perinephric space nodules. The renal mass on the prior measured 6.5 x 4.4 cm and today is estimated in diameter on series 2, image 39 of 7.2 x 5.2 cm. The adjacent nodules are increasing as well. Example more caudal on series 2, image 57 measures 16 x 12 mm today and previously 16 x 9 mm. There is confluent abnormal soft tissue extending into the retroperitoneum as well. Preserved contours of the urinary bladder. Left-sided exophytic  Bosniak 1 renal cyst laterally. Stomach/Bowel: Large bowel has a normal course and caliber with scattered stool. Few left-sided colonic diverticula. Normal appendix. Stomach and small bowel are nondilated. Vascular/Lymphatic: Normal caliber aorta with scattered vascular calcifications. Branch vessel calcifications are seen as well. There has been placement of the stent extending from the infrarenal IVC down into the common iliac veins. Patency of the stent is limited without the advantage of contrast. Once again there is abnormal soft tissue in the nodes seen along the retroperitoneum. The area retrocaval which on the prior measured 4.7 x 2.7 cm, today on series 2, image 41 measures 4.7 by 4.1 cm. There is further extension into the adjacent vertebral body as well as seen on series 2, image 42  of the axial data set and on sagittal image 59 of series 6. Several other abnormal nodes throughout the retroperitoneum are also seen. Example at the bifurcation of the IVC measures today 3.3 x 2.4 cm on series 2, image 52 and previously 3.2 x 2.2 cm. Reproductive: Enlarged prostate with mass effect along the base of the bladder. Other: Mild anasarca. Musculoskeletal: Curvature and degenerative changes of the spine. Trace anterolisthesis of L5 on S1 with pars defects. Multilevel stenosis. Again there is bony erosion by adjacent abnormal lymph nodes at the L2 level. IMPRESSION: Overall progression of known neoplasm. Increasing liver metastases. Right-sided renal masses slight larger in the adjacent perinephric space and retroperitoneum nodules are increasing. Abnormal retroperitoneal lymph node enlargement again identified some areas are similar but others are increasing. In addition there is further extension of soft tissue mass extending into the L2 vertebral body. No bowel obstruction or free air.  Normal appendix. Interval placement of the stent from the IVC into the common iliac veins. Evaluation of patency is limited  without IV contrast. Increasing right-sided adrenal nodule.  Stable left. Increasing lung metastases as seen on separate CT scan of the chest Electronically Signed   By: Jill Side M.D.   On: 10/14/2022 13:59   DG Chest Port 1 View  Result Date: 10/14/2022 CLINICAL DATA:  Sepsis.  History of metastatic urothelial cancer EXAM: PORTABLE CHEST 1 VIEW COMPARISON:  Same day chest CT FINDINGS: Right-sided chest port in place with distal tip at the superior cavoatrial junction. Heart size is normal. Innumerable pulmonary nodules throughout both lungs compatible with known pulmonary metastatic disease. Trace right pleural effusion. No pneumothorax. IMPRESSION: 1. Innumerable pulmonary nodules throughout both lungs compatible with known pulmonary metastatic disease. 2. Trace right pleural effusion. Electronically Signed   By: Davina Poke D.O.   On: 10/14/2022 13:30   CT Angio Chest PE W/Cm &/Or Wo Cm  Result Date: 10/14/2022 CLINICAL DATA:  Chest pain. History of metastatic urothelial cancer. EXAM: CT ANGIOGRAPHY CHEST WITH CONTRAST TECHNIQUE: Multidetector CT imaging of the chest was performed using the standard protocol during bolus administration of intravenous contrast. Multiplanar CT image reconstructions and MIPs were obtained to evaluate the vascular anatomy. RADIATION DOSE REDUCTION: This exam was performed according to the departmental dose-optimization program which includes automated exposure control, adjustment of the mA and/or kV according to patient size and/or use of iterative reconstruction technique. CONTRAST:  39mL OMNIPAQUE IOHEXOL 350 MG/ML SOLN COMPARISON:  CT chest dated September 07, 2022. FINDINGS: Cardiovascular: Satisfactory opacification of the pulmonary arteries to the segmental level. No evidence of pulmonary embolism. Normal heart size. No pericardial effusion. No thoracic aortic aneurysm or dissection. Mediastinum/Nodes: Multiple mediastinal and bilateral hilar lymph nodes are  stable to slightly increased in size. For example, a subcarinal lymph node currently measures 1.1 cm in short axis, previously 7 mm. No enlarged axillary lymph nodes. Thyroid gland, trachea, and esophagus demonstrate no significant findings. Lungs/Pleura: Marked interval increase in size and number of widespread pulmonary metastases. Previously noted cavitary lesion in the posterior right lower lobe has resolved. Increased trace right pleural effusion. No pneumothorax. Upper Abdomen: Large infiltrative mass involving the posterior right kidney is not significantly changed. A similar conglomerate retrocaval lymphadenopathy and perinephric retroperitoneal nodularity. Stable left and enlarging right adrenal metastases. Interval IVC stenting. Musculoskeletal: No chest wall abnormality. No acute or significant osseous findings. Review of the MIP images confirms the above findings. IMPRESSION: 1. No evidence of pulmonary embolism. 2. Progressive widespread pulmonary metastatic disease. 3.  Increased trace right pleural effusion. 4. Similar large infiltrative mass involving the posterior right kidney. Similar conglomerate retrocaval lymphadenopathy and perinephric retroperitoneal nodularity. 5. Stable left and enlarging right adrenal metastases. Electronically Signed   By: Titus Dubin M.D.   On: 10/14/2022 13:11    Labs on Admission: I have personally reviewed following labs  CBC: Recent Labs  Lab 10/12/22 1307 10/14/22 1008 10/14/22 1237  WBC 16.2* 17.4* 15.6*  NEUTROABS 12.2* 13.9* 12.9*  HGB 9.3* 8.9* 9.1*  HCT 27.8* 27.4* 28.9*  MCV 89.4 91.0 92.6  PLT 480* 508* [id number]*   Basic Metabolic Panel: Recent Labs  Lab 10/12/22 1307 10/14/22 1008 10/14/22 1237  NA 127* 129* 129*  K 3.8 4.2 4.2  CL 93* 93* 93*  CO2 27 29 28   GLUCOSE 111* 117* 97  BUN 21 22 20   CREATININE 1.13 1.17 1.16  CALCIUM 11.1* 11.6* 11.5*  MG 1.8 2.0  --    GFR: Estimated Creatinine Clearance: 57.9 mL/min (by C-G formula  based on SCr of 1.16 mg/dL).  Liver Function Tests: Recent Labs  Lab 10/12/22 1307 10/14/22 1008 10/14/22 1237  AST 37 39 34  ALT 44 40 42  ALKPHOS 248* 321* 307*  BILITOT 0.5 0.4 0.5  PROT 6.9 6.8 7.3  ALBUMIN 3.0* 2.9* 3.1*   Coagulation Profile: Recent Labs  Lab 10/14/22 1237  INR 1.2   Urine analysis:    Component Value Date/Time   COLORURINE YELLOW (A) 10/14/2022 1317   APPEARANCEUR HAZY (A) 10/14/2022 1317   LABSPEC 1.032 (H) 10/14/2022 1317   PHURINE 5.0 10/14/2022 1317   GLUCOSEU NEGATIVE 10/14/2022 1317   HGBUR LARGE (A) 10/14/2022 1317   BILIRUBINUR NEGATIVE 10/14/2022 1317   BILIRUBINUR negative 06/10/2020 0819   KETONESUR NEGATIVE 10/14/2022 1317   PROTEINUR 30 (A) 10/14/2022 1317   UROBILINOGEN 0.2 06/10/2020 0819   NITRITE NEGATIVE 10/14/2022 1317   LEUKOCYTESUR NEGATIVE 10/14/2022 1317   CRITICAL CARE Performed by: Dr. Tobie Poet  Total critical care time: 35 minutes  Critical care time was exclusive of separately billable procedures and treating other patients.  Critical care was necessary to treat or prevent imminent or life-threatening deterioration.  Critical care was time spent personally by me on the following activities: development of treatment plan with patient and/or surrogate as well as nursing, discussions with consultants, evaluation of patient's response to treatment, examination of patient, obtaining history from patient or surrogate, ordering and performing treatments and interventions, ordering and review of laboratory studies, ordering and review of radiographic studies, pulse oximetry and re-evaluation of patient's condition.  This document was prepared using Dragon Voice Recognition software and may include unintentional dictation errors.  Dr. Tobie Poet [facility]  If 7PM-7AM, please contact overnight-coverage provider If 7AM-7PM, please contact day coverage provider [email or web address]  10/14/2022, 6:53 PM

## 2022-10-14 NOTE — Progress Notes (Signed)
PHARMACY -  BRIEF ANTIBIOTIC NOTE   Pharmacy has received consult(s) for vancomycin from an ED provider.  The patient's profile has been reviewed for ht/wt/allergies/indication/available labs.    One time order(s) placed for vancomycin 1250 mg IV x 1  Further antibiotics/pharmacy consults should be ordered by admitting physician if indicated.                       Thank you,  Dallie Piles 10/14/2022  1:15 PM

## 2022-10-14 NOTE — Progress Notes (Addendum)
Hematology and Oncology Follow Up Visit  [name] [id number] 09-01-1955 67 y.o. 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 Portera is back for follow-up for:    Patient was seen by Beckey Rutter, NP on 3/25 in [facility] for cough with chest x-ray concerning for possible infection.  Patient was started on antibiotic coverage with Levaquin x 5 days.   He was then seen in [facility] by Altha Harm, NP  for evaluation of nausea and right abdominal pain. His right flank pain was thought to be secondary to the large infiltrative mass involving the right kidney. UA/Culture were obtained and did not show any significant growth of bacteria. He was encouraged to utilize his Norco for pain control. If pain worsened additional imaging was suggested. At this appointment he was subsequently found to have hypercalcemia which was thought to be secondary to dehydration. He was given IV for this and Zometa was suggested should this fail to resolve with IVF. Finally, he was given mirtazapine and asked to hold his tradozone to help with his poor oral intake. He was asked to return in 2-3 days for recheck of his hypercalcemia and consideration of additional IVF if needed.   Today he is here with his daughter. He reports that he feels worse than he had previously. He states that he is having pain behind his port as well as some SOB and chest pain. This has been occurring over the past few hours since last night. He reports the chest pain as substernal to right chest in nature. Feels like his heart is beating harder and stronger than normal. Took off his fentanyl patch a few hours ago to see if this was causing the  symptoms but it has not helped. 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 Currently on Eliquis.   In terms of eating/drinking this is minimal per daughter. Mostly eating carnation instant breakfast with milk.    Wt Readings from Last 3 Encounters:  10/12/22 142 lb 4.8 oz (64.5 kg)  10/01/22 151 lb 9.6 oz (68.8 kg)  09/28/22 151 lb 12.8 oz (68.9 kg)     Medications:   Current Outpatient Medications:    apixaban (ELIQUIS) 5 MG TABS tablet, Take 1 tablet (5 mg total) by mouth 2 (two) times daily., Disp: 360 tablet, Rfl: 0   HYDROcodone-acetaminophen (NORCO) 5-325 MG tablet, Take 1 tablet by mouth every 6 (six) hours as needed for moderate pain., Disp: 120 tablet, Rfl: 0   lidocaine-prilocaine (EMLA) cream, Apply to affected area once, Disp: 30 g, Rfl: 3   sildenafil (REVATIO) 20 MG tablet, Take 1-3 tablets (20-60 mg total) by mouth daily as needed., Disp: 30 tablet, Rfl: 11   tamsulosin (FLOMAX) 0.4 MG CAPS capsule, Take 1 capsule (0.4 mg total) by mouth daily., Disp: 30 capsule, Rfl: 11   acetaminophen (TYLENOL) 500 MG tablet, Take 500 mg by mouth every 6 (six) hours as needed. (Patient not taking: Reported on 10/05/2022), Disp: , Rfl:    docusate sodium (COLACE) 250 MG capsule, Take 250 mg by mouth daily. (  Patient not taking: Reported on 10/05/2022), Disp: , Rfl:    metoCLOPramide (REGLAN) 10 MG tablet, Take 1 tablet (10 mg total) by mouth 4 (four) times daily. (Patient not taking: Reported on 10/12/2022), Disp: 120 tablet, Rfl: 1   metoprolol succinate (TOPROL-XL) 25 MG 24 hr tablet, TAKE 1 TABLET (25 MG TOTAL) BY MOUTH DAILY. (Patient not taking: Reported on 10/05/2022), Disp: 90 tablet, Rfl: 2   ondansetron (ZOFRAN) 8 MG tablet, Take 1 tablet (8 mg total) by mouth every 8 (eight) hours as needed for nausea or vomiting. (Patient not taking: Reported on 10/05/2022), Disp: 90 tablet, Rfl: 3   ondansetron (ZOFRAN-ODT) 8 MG disintegrating tablet, Take 8 mg by mouth every 8  (eight) hours as needed. (Patient not taking: Reported on 10/05/2022), Disp: , Rfl:    pantoprazole (PROTONIX) 40 MG tablet, TAKE 1 TABLET BY MOUTH EVERY DAY (Patient not taking: Reported on 10/05/2022), Disp: 90 tablet, Rfl: 2   prochlorperazine (COMPAZINE) 10 MG tablet, Take 1 tablet (10 mg total) by mouth every 6 (six) hours as needed (Nausea or vomiting). (Patient not taking: Reported on 10/05/2022), Disp: 60 tablet, Rfl: 2   traZODone (DESYREL) 50 MG tablet, Take 0.5 tablets (25 mg total) by mouth at bedtime as needed for sleep. (Patient not taking: Reported on 10/05/2022), Disp: 30 tablet, Rfl: 0 No current facility-administered medications for this visit.  Facility-Administered Medications Ordered in Other Visits:    heparin lock flush 100 unit/mL, 500 Units, Intravenous, Once, Verlon Au, NP  Allergies:  Allergies  Allergen Reactions   Amoxicillin Itching   Atenolol Diarrhea    Other reaction(s): HIVES   Other Nausea And Vomiting    Navy Beans   Oxycodone     Other reaction(s): HIVES   Shellfish Allergy Itching and Swelling    Past Medical History, Surgical history, Social history, and Family History were reviewed and updated.  Review of Systems: Review of Systems  Constitutional:  Positive for fatigue and unexpected weight change. Negative for appetite change and fever.  Respiratory:  Positive for chest tightness and shortness of breath. Negative for cough, hemoptysis and wheezing.   Cardiovascular:  Positive for chest pain. Negative for leg swelling and palpitations.  Genitourinary:  Negative for difficulty urinating, hematuria and pelvic pain.   Musculoskeletal:  Negative for back pain and flank pain.    Physical Exam:  tympanic temperature is 99 F (37.2 C). His blood pressure is 107/76 and his pulse is 128 (abnormal). His oxygen saturation is 97%.   Physical Exam Vitals and nursing note reviewed.  Constitutional:      General: He is not in acute distress.     Appearance: Normal appearance. He is ill-appearing. He is not toxic-appearing or diaphoretic.  HENT:     Head: Normocephalic and atraumatic.     Mouth/Throat:     Mouth: Mucous membranes are dry.  Eyes:     Extraocular Movements: Extraocular movements intact.     Conjunctiva/sclera: Conjunctivae normal.  Cardiovascular:     Rate and Rhythm: Regular rhythm. Tachycardia present.     Pulses: Normal pulses.     Heart sounds: Normal heart sounds.  Pulmonary:     Effort: No respiratory distress.     Breath sounds: Normal breath sounds. No stridor. No wheezing, rhonchi or rales.  Abdominal:     General: Bowel sounds are normal.     Palpations: Abdomen is soft.  Musculoskeletal:     Cervical back: Neck supple.     Comments: No  tenderness to palpation of chest wall  Lymphadenopathy:     Cervical: No cervical adenopathy.  Skin:    General: Skin is warm.     Comments: No erythema, edema, tenderness of the chest wall or near port site  Neurological:     Mental Status: He is alert and oriented to person, place, and time.    Lab Results  Component Value Date   WBC 17.4 (H) 10/14/2022   HGB 8.9 (L) 10/14/2022   HCT 27.4 (L) 10/14/2022   MCV 91.0 10/14/2022   PLT 508 (H) 10/14/2022     Chemistry      Component Value Date/Time   NA 129 (L) 10/14/2022 1008   NA 142 12/02/2021 1332   K 4.2 10/14/2022 1008   CL 93 (L) 10/14/2022 1008   CO2 29 10/14/2022 1008   BUN 22 10/14/2022 1008   BUN 12 12/02/2021 1332   CREATININE 1.17 10/14/2022 1008   CREATININE 1.13 10/12/2022 1307   GLU 96 04/24/2014 0000      Component Value Date/Time   CALCIUM 11.6 (H) 10/14/2022 1008   ALKPHOS 321 (H) 10/14/2022 1008   AST 39 10/14/2022 1008   AST 37 10/12/2022 1307   ALT 40 10/14/2022 1008   ALT 44 10/12/2022 1307   BILITOT 0.4 10/14/2022 1008   BILITOT 0.5 10/12/2022 1307      Impression and Plan: 1. Chest pain, unspecified type - EKG 12-Lead - CT Angio Chest Pulmonary Embolism (PE) W or  WO Contrast; Future - Troponin I (High Sensitivity); Future  2. SOB (shortness of breath) - EKG 12-Lead - CT Angio Chest Pulmonary Embolism (PE) W or WO Contrast; Future  3. History of DVT (deep vein thrombosis) - EKG 12-Lead - CT Angio Chest Pulmonary Embolism (PE) W or WO Contrast; Future  4. Transitional cell carcinoma of right kidney - CT Angio Chest Pulmonary Embolism (PE) W or WO Contrast; Future  5. Hyponatremia  6. Hypercalcemia  7. Elevated alkaline phosphatase level  8. Leukocytosis, unspecified type  9. Neoplasm related pain  10. Anemia, unspecified type    Mr. Dunmore is a 33 male who presents as a follow up [facility] visit to our office. He has new concerns today of SOB and chest pain. EKG does not suggest any bundle branch blocks of ST elevation. Troponin will be obtained to help rule out NSTEMI. Higher concern of PE vs pneumonia given vitals, history, labs. CTA pending.   On his complete blood count he has had a slight elevation of his leukocytosis- he is not on any steroids for the previously suspected bronchitis nor has he had any bone marrow stimulator medications along with his chemotherapy. No obvious infection visualized- he finished his course of levaquin as prescribed for the bronchitis. Flank pain has resolved and he recently had a non-concerning urine culture. Possibly inflammatory vs reactive.   His anemia has worsened slightly- likely secondary to chemotherapy and disease process. Thrombocytosis- likely reactive.   His hyponatremia has improved a bit with IVF. He does not report increased SOB with the IVF and he does not exhibit any peripheral edema. He continues to have weight loss and poor oral intake. IVF today to help.   His hypercalcemia continues to be an issue for him. His calcium today is 11.6. Dehydration vs malignancy driven vs dietary vs hyperparathyroidism- lower likelihood. Corrected calcium is 12.1. We will continue to monitor and if he gets above  14 we will start Zometa per Up[employer]. He has full dentures  so he will not need dental clearance. Hope is that as he continues chemotherapy his tumor burden may decline and he will have subsequent reduction in his calcium levels. Encouraged a low calcium diet with diet modifications however patient states that he is a pretty picky when it comes to eating and our ultimate goal is to keep his weight stable.   His alkaline phosphatase level continues to trend upwards which is likely secondary to his progressive and large disease burden. AST/ALT normal.   Overall his neoplasm related pain is well controlled on current pain control regimen.   I see that patient is due for his 3rd cycle of chemotherapy tomorrow. Lower likelihood that he will be feeling well enough for this so I have alerted his oncologist.   UPDATE: First troponin was 52- CTA cancelled as this had not yet been performed as he had just arrived to imaging when his lab resulted and instead he was diverted immediately to the Emergency Room. His daughter, radiology and charge nurse all alerted to his status and upcoming arrival.   Disposition: IVF fluids today EKG Troponin  CTA RTC tomorrow as planned.    Nelwyn Salisbury PA-C 4/3/202411:05 AM

## 2022-10-14 NOTE — Assessment & Plan Note (Addendum)
Metoprolol succinate 25 mg daily resumed 10/15/2022

## 2022-10-14 NOTE — Assessment & Plan Note (Signed)
I suspect suprapubic pain, right groin, right low back pain is cancer associated pain given CT read of enlarging prostate with mass effect into the bladder, enlarging right sided renal mass, increasing liver metastasis

## 2022-10-14 NOTE — Assessment & Plan Note (Addendum)
Etiology workup in progress at this time, possible commune acquired pneumonia/atypical pneumonia Blood cultures x 2 are in process Check procalcitonin, MRSA PCR screening Continue broad-spectrum antibiotic for unknown source at this time including vancomycin, cefepime per pharmacy and metronidazole 500 mg IV twice daily Procalcitonin was elevated, added azithromycin for atypical coverage Given that CT abdomen pelvis was negative for acute intra-abdominal signs of infection, metronidazole 500 mg IV twice daily has been discontinued Recheck procalcitonin in the a.m. to ensure antibiotic effectiveness DuoNebs, 2 treatments scheduled for day of admission DuoNebs every 6 hours as needed for shortness of breath and wheezing ordered for 10/15/2022 Admit to PCU, inpatient

## 2022-10-14 NOTE — ED Provider Notes (Signed)
[facility] Provider Note    Event Date/Time   First MD Initiated Contact with Patient 10/14/22 1226     (approximate)   History   Chest Pain   HPI  Jeffrey Fernandez is a 67 y.o. 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  He has had subacute cough for the last several weeks and then it worsened yesterday.  He had finished the antibiotic course for pneumonia several weeks ago.  He also has a longstanding right-sided inguinal hernia which she feels is more painful than usual, passing flatus and bowel movements as normal, no dysuria.  He has chronic right-sided flank pain that radiates to the right lower quadrant associated with his cancer.    He has been fully compliant with his Eliquis.  Independent Historian contributed to assessment above: His daughter who is at bedside who elaborate on his past medical history  External Medical Documents Reviewed: Hematology and oncology follow-up visit note from earlier today documenting his chest pain and shortness of breath concern for PE and sent to the emergency department for further evaluation      Physical Exam   Triage Vital Signs: ED Triage Vitals  Enc Vitals Group     BP 10/14/22 1212 (!) 136/92     Pulse Rate 10/14/22 1212 (!) 116     Resp 10/14/22 1212 18     Temp 10/14/22 1212 98 F (36.7 C)     Temp Source 10/14/22 1212 Oral     SpO2 10/14/22 1212 97 %     Weight 10/14/22 1213 142 lb 4.8 oz (64.5 kg)     Height 10/14/22 1213 5\' 9"  (1.753 m)     Head Circumference --      Peak Flow --      Pain Score 10/14/22 1212 2     Pain Loc --      Pain Edu? --      Excl. in [place]? --     Most recent vital signs: Vitals:   10/14/22 1400 10/14/22 1420  BP:  (!) 146/86  Pulse: 100 (!) 101  Resp: (!) 22 (!) 25   Temp:    SpO2: (!) 87% 97%    General: Awake, no distress.  CV:  Good peripheral perfusion.  Resp:  Normal effort.  Abd:  No distention.  Other:  Awake alert comfortable appearing nontoxic but is tachycardic 116 and normotensive without a fever.  He was slightly tachypneic in the low 20s on arrival.  His abdomen is soft and he has some right-sided tenderness to palpation no obvious mass or hernia and his testicles appear normal without tenderness to palpation.  His lungs are nonfocal no wheezing, he does have an occasional wet sounding cough   ED Results / Procedures / Treatments   Labs (all labs ordered are listed, but only abnormal results are displayed) Labs Reviewed  COMPREHENSIVE METABOLIC PANEL - Abnormal; Notable for the following components:      Result Value   Sodium 129 (*)    Chloride 93 (*)    Calcium 11.5 (*)    Albumin 3.1 (*)    Alkaline Phosphatase 307 (*)    All other components within normal limits  CBC WITH DIFFERENTIAL/PLATELET - Abnormal; Notable for the following components:   WBC 15.6 (*)  RBC 3.12 (*)    Hemoglobin 9.1 (*)    HCT 28.9 (*)    Platelets 469 (*)    Neutro Abs 12.9 (*)    Monocytes Absolute 1.2 (*)    Abs Immature Granulocytes 0.23 (*)    All other components within normal limits  URINALYSIS, W/ REFLEX TO CULTURE (INFECTION SUSPECTED) - Abnormal; Notable for the following components:   Color, Urine YELLOW (*)    APPearance HAZY (*)    Specific Gravity, Urine 1.032 (*)    Hgb urine dipstick LARGE (*)    Protein, ur 30 (*)    All other components within normal limits  TROPONIN I (HIGH SENSITIVITY) - Abnormal; Notable for the following components:   Troponin I (High Sensitivity) 53 (*)    All other components within normal limits  CULTURE, BLOOD (ROUTINE X 2)  CULTURE, BLOOD (ROUTINE X 2)  SARS CORONAVIRUS 2 BY RT PCR  LACTIC ACID, PLASMA  PROTIME-INR  APTT  LACTIC ACID, PLASMA  TROPONIN I (HIGH SENSITIVITY)     I ordered  and reviewed the above labs they are notable for has an elevated white blood cell count at 15 w flat troponins in the low 50s  EKG  ED ECG REPORT I, Lucillie Garfinkel, the attending physician, personally viewed and interpreted this ECG.   Date: 10/14/2022  EKG Time: 1316  Rate: 99  Rhythm: sinus  Axis: LAD  Intervals:none  ST&T Change: no acute ischemic changes    RADIOLOGY I independently reviewed and interpreted chest x-ray and see diffuse nodules consistent with metastatic disease   PROCEDURES:  Critical Care performed: Yes, see critical care procedure note(s)  .Critical Care  Performed by: Lucillie Garfinkel, MD Authorized by: Lucillie Garfinkel, MD   Critical care provider statement:    Critical care time (minutes):  30   Critical care was time spent personally by me on the following activities:  Development of treatment plan with patient or surrogate, discussions with consultants, evaluation of patient's response to treatment, examination of patient, ordering and review of laboratory studies, ordering and review of radiographic studies, ordering and performing treatments and interventions, pulse oximetry, re-evaluation of patient's condition and review of old charts    MEDICATIONS ORDERED IN ED: Medications  vancomycin (VANCOREADY) IVPB 1250 mg/250 mL (1,250 mg Intravenous New Bag/Given 10/14/22 1421)  sodium chloride 0.9 % bolus 1,000 mL (has no administration in time range)  sodium chloride 0.9 % bolus 1,000 mL (1,000 mLs Intravenous New Bag/Given 10/14/22 1350)  iohexol (OMNIPAQUE) 350 MG/ML injection 75 mL (50 mLs Intravenous Contrast Given 10/14/22 1241)  ceFEPIme (MAXIPIME) 2 g in sodium chloride 0.9 % 100 mL IVPB (0 g Intravenous Stopped 10/14/22 1419)  metroNIDAZOLE (FLAGYL) IVPB 500 mg (0 mg Intravenous Stopped 10/14/22 1419)  ondansetron (ZOFRAN) injection 4 mg (4 mg Intravenous Given 10/14/22 1421)    External physician / consultants:  I spoke with hospitalist for  admission and  regarding care plan for this patient.   IMPRESSION / MDM / ASSESSMENT AND PLAN / ED COURSE  I reviewed the triage vital signs and the nursing notes.                                Patient's presentation is most consistent with acute presentation with potential threat to life or bodily function.  Differential diagnosis includes, but is not limited to, respiratory infection, bacterial pneumonia, viral URI, sepsis, intra-abdominal infection, PE, ACS  The patient is on the cardiac monitor to evaluate for evidence of arrhythmia and/or significant heart rate changes.  MDM:   This is an immunocompromised patient with productive cough chest pain shortness of breath and tachycardia with an increased white blood cell count concerning for sepsis.  Broad-spectrum antibiotics ordered as well as IV crystalloid cc per kilogram ideal body weight.  Source is most likely respiratory given his productive cough and recent pneumonia.  Could also be intra-abdominal given his flank/abdominal pain though this is subacute in nature and more related to his cancer pain.  Check urinalysis as well as sepsis labs lactic and blood cultures  Given his chest pain check for ACS though this would be atypical and his EKG is nonischemic and unchanged troponins x 2 makes this less likely.  Check for PE with a CT angiogram given his recent DVT and cancer status though this would be unusual given his full compliance with Eliquis.  No obvious sources identified in this patient thus far CT angiogram shows metastatic disease and no PE, urinalysis without signs of urine infection, he has gotten 1 L of crystalloid and his heart rate remains 110s and he is normotensive and comfortable appearing.  His oxygen saturation is in the low 90s/high 80s and he has no respiratory distress we put him on nasal cannula oxygen and admit for further workup         FINAL CLINICAL IMPRESSION(S) / ED DIAGNOSES   Final diagnoses:  Nonspecific  chest pain  Shortness of breath  Productive cough     Rx / DC Orders   ED Discharge Orders     None        Note:  This document was prepared using Dragon voice recognition software and may include unintentional dictation errors.    Lucillie Garfinkel, MD 10/14/22 [phone]

## 2022-10-14 NOTE — Hospital Course (Signed)
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  Vitals in the ED showed temperature of 98, respiration rate of 18, heart rate 128, blood pressure 107/76, improved to 146/86, SpO2 of 97% on room air and then desatted to 87%, requiring 2 L nasal cannula with improvement to 97%.  Serum sodium is 129, potassium 4.2, chloride 93, bicarb 28, BUN of 20, serum creatinine 1.16, EGFR greater than 60, nonfasting blood glucose 97, WBC 15.6, hemoglobin 9.1, platelets of 469.  High sensitive troponin was initially 52 and on repeat was 53.  Lactic acid is initially 1.2 and on repeat is 1.1.  Alk phos is elevated at 307.  Magnesium level was 2.0.  COVID PCR is in process.  Blood cultures x 2 are collected and in process.  ED treatment: Cefepime 2 g IV, Flagyl 500 mg IV one-time dose, vancomycin per pharmacy, sodium chloride 2 L bolus, ondansetron 4 mg IV one-time dose.

## 2022-10-14 NOTE — ED Notes (Signed)
Assumed care from [facility]. Pt resting comfortably in bed at this time. Pt denies any current needs or questions. Call light with in reach. Hospital socks placed on pt.

## 2022-10-14 NOTE — Assessment & Plan Note (Addendum)
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

## 2022-10-14 NOTE — ED Triage Notes (Signed)
From cancer center. Broughtdue to chest pain started yewsterday.  Worse this am.  Worse withcough.

## 2022-10-15 ENCOUNTER — Other Ambulatory Visit: Payer: BLUE CROSS/BLUE SHIELD

## 2022-10-15 ENCOUNTER — Ambulatory Visit: Payer: BLUE CROSS/BLUE SHIELD | Admitting: Oncology

## 2022-10-15 ENCOUNTER — Ambulatory Visit: Payer: BLUE CROSS/BLUE SHIELD

## 2022-10-15 ENCOUNTER — Inpatient Hospital Stay: Payer: BLUE CROSS/BLUE SHIELD

## 2022-10-15 ENCOUNTER — Inpatient Hospital Stay: Payer: BLUE CROSS/BLUE SHIELD | Admitting: Oncology

## 2022-10-15 DIAGNOSIS — R6510 Systemic inflammatory response syndrome (SIRS) of non-infectious origin without acute organ dysfunction: Secondary | ICD-10-CM

## 2022-10-15 DIAGNOSIS — G893 Neoplasm related pain (acute) (chronic): Principal | ICD-10-CM

## 2022-10-15 DIAGNOSIS — D72829 Elevated white blood cell count, unspecified: Secondary | ICD-10-CM

## 2022-10-15 DIAGNOSIS — E871 Hypo-osmolality and hyponatremia: Secondary | ICD-10-CM

## 2022-10-15 DIAGNOSIS — I82401 Acute embolism and thrombosis of unspecified deep veins of right lower extremity: Secondary | ICD-10-CM

## 2022-10-15 DIAGNOSIS — R7989 Other specified abnormal findings of blood chemistry: Secondary | ICD-10-CM

## 2022-10-15 DIAGNOSIS — C649 Malignant neoplasm of unspecified kidney, except renal pelvis: Secondary | ICD-10-CM

## 2022-10-15 DIAGNOSIS — G4700 Insomnia, unspecified: Secondary | ICD-10-CM

## 2022-10-15 DIAGNOSIS — I10 Essential (primary) hypertension: Secondary | ICD-10-CM

## 2022-10-15 LAB — CBC
HCT: 26.2 % — ABNORMAL LOW (ref 39.0–52.0)
Hemoglobin: 8.2 g/dL — ABNORMAL LOW (ref 13.0–17.0)
MCH: 29.4 pg (ref 26.0–34.0)
MCHC: 31.3 g/dL (ref 30.0–36.0)
MCV: 93.9 fL (ref 80.0–100.0)
Platelets: 490 10*3/uL — ABNORMAL HIGH (ref 150–400)
RBC: 2.79 MIL/uL — ABNORMAL LOW (ref 4.22–5.81)
RDW: 15.3 % (ref 11.5–15.5)
WBC: 16.8 10*3/uL — ABNORMAL HIGH (ref 4.0–10.5)
nRBC: 0 % (ref 0.0–0.2)

## 2022-10-15 LAB — BASIC METABOLIC PANEL
Anion gap: 5 (ref 5–15)
BUN: 14 mg/dL (ref 8–23)
CO2: 28 mmol/L (ref 22–32)
Calcium: 11.3 mg/dL — ABNORMAL HIGH (ref 8.9–10.3)
Chloride: 98 mmol/L (ref 98–111)
Creatinine, Ser: 1.02 mg/dL (ref 0.61–1.24)
GFR, Estimated: >60 mL/min (ref >60)
Glucose, Bld: 90 mg/dL (ref 70–99)
Potassium: 3.9 mmol/L (ref 3.5–5.1)
Sodium: 131 mmol/L — ABNORMAL LOW (ref 135–145)

## 2022-10-15 LAB — PROTIME-INR
INR: 1.3 — ABNORMAL HIGH (ref 0.8–1.2)
Prothrombin Time: 16.1 seconds — ABNORMAL HIGH (ref 11.4–15.2)

## 2022-10-15 LAB — CORTISOL-AM, BLOOD: Cortisol - AM: 18.9 ug/dL (ref 6.7–22.6)

## 2022-10-15 MED ORDER — SODIUM CHLORIDE 0.9 % IV SOLN
2.0000 g | Freq: Three times a day (TID) | INTRAVENOUS | Status: DC
  Administered 2022-10-15 – 2022-10-16 (×2): 2 g via INTRAVENOUS
  Filled 2022-10-15 (×4): qty 12.5

## 2022-10-15 MED ORDER — ZOLEDRONIC ACID 4 MG/5ML IV CONC
4.0000 mg | Freq: Once | INTRAVENOUS | Status: AC
  Administered 2022-10-15: 4 mg via INTRAVENOUS
  Filled 2022-10-15: qty 5

## 2022-10-15 MED ORDER — DOCUSATE SODIUM 50 MG/5ML PO LIQD
250.0000 mg | Freq: Two times a day (BID) | ORAL | Status: DC | PRN
  Administered 2022-10-15: 250 mg via ORAL
  Filled 2022-10-15: qty 30

## 2022-10-15 MED ORDER — FENTANYL 25 MCG/HR TD PT72
1.0000 | MEDICATED_PATCH | TRANSDERMAL | Status: DC | PRN
  Administered 2022-10-15: 1 via TRANSDERMAL
  Filled 2022-10-15: qty 1

## 2022-10-15 MED ORDER — FENTANYL 25 MCG/HR TD PT72
1.0000 | MEDICATED_PATCH | TRANSDERMAL | Status: DC
Start: 2022-10-15 — End: 2022-10-15

## 2022-10-15 MED ORDER — LACTATED RINGERS IV SOLN: INTRAVENOUS | Status: AC

## 2022-10-15 NOTE — ED Notes (Signed)
Report received. Pt noted to be resting quietly in bed with eyes closed. Respirations even and unlabored. Bed in lowest position and locked with bed rails up X 2. Family member at bedside. No distress noted.

## 2022-10-15 NOTE — Progress Notes (Signed)
Pharmacy Antibiotic Note  Jeffrey Fernandez is a 67 y.o. male w/ PMH of urothelial carcinoma on chemotherapy, DVT on Eliquis, hypertension, hyperlipidemia admitted on 10/14/2022 with sepsis of unclear etiology.  Pharmacy has been consulted for cefepime  dosing.  Plan: adjust cefepime to 2 grams IV every 8 hours --follow renal function for needed dose adjustments  Height: 5\' 9"  (175.3 cm) Weight: 64.5 kg (142 lb 4.8 oz) IBW/kg (Calculated) : 70.7  Temp (24hrs), Avg:98.2 F (36.8 C), Min:98 F (36.7 C), Max:98.4 F (36.9 C)  Recent Labs  Lab 10/12/22 1307 10/14/22 1008 10/14/22 1237 10/14/22 1415 10/15/22 0635  WBC 16.2* 17.4* 15.6*  --  16.8*  CREATININE 1.13 1.17 1.16  --  1.02  LATICACIDVEN  --   --  1.2 1.1  --      Estimated Creatinine Clearance: 65.9 mL/min (by C-G formula based on SCr of 1.02 mg/dL).    Allergies  Allergen Reactions   Amoxicillin Itching   Atenolol Diarrhea    Other reaction(s): HIVES   Other Nausea And Vomiting    Navy Beans   Oxycodone     Other reaction(s): HIVES   Shellfish Allergy Itching and Swelling    Antimicrobials this admission: 04/03 vancomycin >> 04/04 04/03 metronidazole >> 04/04 04/03 cefepime >>   Microbiology results: 04/03 BCx: NGTD 04/01 UCx: insignificant growth  04/03 MRSA PCR negative  Thank you for allowing pharmacy to be a part of this patient's care.  Dallie Piles 10/15/2022 11:14 AM

## 2022-10-15 NOTE — Consult Note (Signed)
[place]  [phone] [phone]  ID: GIANNIS LOCHER OB: [birth year]  MR#: [id number]  [id number]  Patient Care Team: Birdie Sons, MD as PCP - General (Family Medicine) Billey Co, MD as Consulting Physician (Urology) Lloyd Huger, MD as Consulting Physician (Oncology)  CHIEF COMPLAINT: Progressive stage IV urothelial carcinoma  INTERVAL HISTORY: Patient is a 67 year old male receiving chemotherapy for stage IV urothelial carcinoma who was recently admitted to the hospital with increasing pain.  Infectious workup is negative to date.  CT scan revealed likely progression of disease.  Patient also noted to have hypercalcemia.  Patient's pain is better controlled today.  He continues to have a decreased performance status and poor appetite.  He has no neurologic complaints.  He denies any recent fevers or illnesses.  His chest pain has improved and he denies any cough, shortness of breath, or hemoptysis.  He has no nausea, vomiting, constipation, or diarrhea.  He continues to have abdominal pain.  He has no urinary complaints.  Patient offers no further specific complaints.  REVIEW OF SYSTEMS:   Review of Systems  Constitutional:  Positive for malaise/fatigue. Negative for fever and weight loss.  Respiratory: Negative.  Negative for cough, hemoptysis and shortness of breath.   Cardiovascular:  Positive for chest pain. Negative for leg swelling.  Gastrointestinal:  Positive for abdominal pain.  Genitourinary: Negative.  Negative for dysuria.  Musculoskeletal: Negative.  Negative for back pain.  Skin: Negative.  Negative for rash.  Neurological:  Positive for weakness. Negative for dizziness, focal weakness and headaches.  Psychiatric/Behavioral: Negative.  The patient is not nervous/anxious.     As per HPI. Otherwise, a complete review of systems is negative.  PAST MEDICAL HISTORY: Past Medical History:  Diagnosis Date    Cancer 2013   bladder   Diverticulitis    History of chickenpox    History of measles    History of mumps    Hyperlipidemia    Hypertension     PAST SURGICAL HISTORY: Past Surgical History:  Procedure Laterality Date   HERNIA REPAIR  2002,2003   IR IMAGING GUIDED PORT INSERTION  08/25/2022   IVC VENOGRAPHY N/A 09/11/2022   Procedure: IVC Venography;  Surgeon: Katha Cabal, MD;  Location: [facility];  Service: Cardiovascular;  Laterality: N/A;   PERIPHERAL VASCULAR THROMBECTOMY Right 09/08/2022   Procedure: PERIPHERAL VASCULAR THROMBECTOMY;  Surgeon: Katha Cabal, MD;  Location: [facility];  Service: Cardiovascular;  Laterality: Right;   SIGMOIDOSCOPY  1998   [facility]   TRANSURETHRAL RESECTION OF BLADDER TUMOR  05/11/2012   Dr. Sherral Hammers, [place]  2002   Bilateral inguinal herniorrhaphies    FAMILY HISTORY: Family History  Problem Relation Age of Onset   COPD Mother    Diverticulosis Mother    Diverticulosis Father    Diabetes Father    Hypertension Father    Diverticulosis Sister    Stroke Paternal Grandmother     ADVANCED DIRECTIVES (Y/N):  @ADVDIR @  HEALTH MAINTENANCE: Social History   Tobacco Use   Smoking status: Former    Packs/day: 2.00    Years: 39.00    Additional pack years: 0.00    Total pack years: 78.00    Types: Cigarettes    Quit date: 07/14/2011    Years since quitting: 11.2    Passive exposure: Past   Smokeless tobacco: Never   Tobacco comments:    smoked 1 to 1  1/2  ppd for 35 years  Vaping Use   Vaping Use: Never used  Substance Use Topics   Alcohol use: Yes    Comment: drinks 2 beers daily: none since 1 week ago   Drug use: No     Colonoscopy:  PAP:  Bone density:  Lipid panel:  Allergies  Allergen Reactions   Amoxicillin Itching   Atenolol Diarrhea    Other reaction(s): HIVES   Other Nausea And Vomiting    [employer]   Oxycodone     Other reaction(s): HIVES   Shellfish  Allergy Itching and Swelling    Current Facility-Administered Medications  Medication Dose Route Frequency Provider Last Rate Last Admin   acetaminophen (TYLENOL) tablet 650 mg  650 mg Oral Q6H PRN Cox, Amy N, DO   650 mg at 10/14/22 2104   Or   acetaminophen (TYLENOL) suppository 650 mg  650 mg Rectal Q6H PRN Cox, Amy N, DO       apixaban (ELIQUIS) tablet 5 mg  5 mg Oral BID Cox, Amy N, DO   5 mg at 10/15/22 0846   ceFEPIme (MAXIPIME) 2 g in sodium chloride 0.9 % 100 mL IVPB  2 g Intravenous Q8H Dallie Piles, RPH       chlorpheniramine-HYDROcodone (TUSSIONEX) 10-8 MG/5ML suspension 5 mL  5 mL Oral QHS PRN Cox, Amy N, DO       docusate (COLACE) 50 MG/5ML liquid 250 mg  250 mg Oral BID PRN Hallaji, Sheema M, RPH   250 mg at 10/15/22 1031   [START ON 10/17/2022] fentaNYL (DURAGESIC) 25 MCG/HR 1 patch  1 patch Transdermal Q72H PRN Cox, Amy N, DO       HYDROcodone-acetaminophen (NORCO/VICODIN) 5-325 MG per tablet 1 tablet  1 tablet Oral Q6H PRN Cox, Amy N, DO   1 tablet at 10/15/22 0846   ipratropium-albuterol (DUONEB) 0.5-2.5 (3) MG/3ML nebulizer solution 3 mL  3 mL Nebulization Q6H PRN Cox, Amy N, DO       lactated ringers infusion   Intravenous Continuous Cox, Amy N, DO 125 mL/hr at 10/15/22 1051 Restarted at 10/15/22 1051   lidocaine (LIDODERM) 5 % 1 patch  1 patch Transdermal Daily PRN Cox, Amy N, DO   1 patch at 10/14/22 2107   metoprolol succinate (TOPROL-XL) 24 hr tablet 25 mg  25 mg Oral Daily Cox, Amy N, DO   25 mg at 10/15/22 0846   morphine (PF) 4 MG/ML injection 4 mg  4 mg Intravenous Q4H PRN Cox, Amy N, DO   4 mg at 10/15/22 0843   ondansetron (ZOFRAN) tablet 4 mg  4 mg Oral Q6H PRN Cox, Amy N, DO       Or   ondansetron (ZOFRAN) injection 4 mg  4 mg Intravenous Q6H PRN Cox, Amy N, DO   4 mg at 10/15/22 0845   tamsulosin (FLOMAX) capsule 0.4 mg  0.4 mg Oral Daily Cox, Amy N, DO   0.4 mg at 10/15/22 0846   traZODone (DESYREL) tablet 25 mg  25 mg Oral QHS PRN Cox, Amy N, DO        Current Outpatient Medications  Medication Sig Dispense Refill   acetaminophen (TYLENOL) 500 MG tablet Take 500 mg by mouth every 6 (six) hours as needed.     apixaban (ELIQUIS) 5 MG TABS tablet Take 1 tablet (5 mg total) by mouth 2 (two) times daily. 360 tablet 0   docusate sodium (COLACE) 250 MG capsule Take 250 mg by  mouth daily.     HYDROcodone-acetaminophen (NORCO) 5-325 MG tablet Take 1 tablet by mouth every 6 (six) hours as needed for moderate pain. 120 tablet 0   lidocaine-prilocaine (EMLA) cream Apply to affected area once 30 g 3   metoCLOPramide (REGLAN) 10 MG tablet Take 1 tablet (10 mg total) by mouth 4 (four) times daily. 120 tablet 1   metoprolol succinate (TOPROL-XL) 25 MG 24 hr tablet TAKE 1 TABLET (25 MG TOTAL) BY MOUTH DAILY. 90 tablet 2   ondansetron (ZOFRAN) 8 MG tablet Take 1 tablet (8 mg total) by mouth every 8 (eight) hours as needed for nausea or vomiting. 90 tablet 3   ondansetron (ZOFRAN-ODT) 8 MG disintegrating tablet Take 8 mg by mouth every 8 (eight) hours as needed.     pantoprazole (PROTONIX) 40 MG tablet TAKE 1 TABLET BY MOUTH EVERY DAY 90 tablet 2   prochlorperazine (COMPAZINE) 10 MG tablet Take 1 tablet (10 mg total) by mouth every 6 (six) hours as needed (Nausea or vomiting). 60 tablet 2   tamsulosin (FLOMAX) 0.4 MG CAPS capsule Take 1 capsule (0.4 mg total) by mouth daily. 30 capsule 11   traZODone (DESYREL) 50 MG tablet Take 0.5 tablets (25 mg total) by mouth at bedtime as needed for sleep. 30 tablet 0   fentaNYL (DURAGESIC) 25 MCG/HR Place 1 patch onto the skin every 3 (three) days.     Facility-Administered Medications Ordered in Other Encounters  Medication Dose Route Frequency Provider Last Rate Last Admin   heparin lock flush 100 unit/mL  500 Units Intravenous Once Verlon Au, NP        OBJECTIVE: Vitals:   10/15/22 0900 10/15/22 1200  BP: (!) 148/81 125/80  Pulse: 99 87  Resp: (!) 22 18  Temp: 98.4 F (36.9 C)   SpO2: 96% 97%     Body  mass index is 21.01 kg/m.    ECOG FS:3 - Symptomatic, >50% confined to bed  General: Ill-appearing, no acute distress. Eyes: Pink conjunctiva, anicteric sclera. HEENT: Normocephalic, moist mucous membranes. Lungs: No audible wheezing or coughing. Heart: Regular rate and rhythm. Abdomen: Soft, nontender, no obvious distention. Musculoskeletal: No edema, cyanosis, or clubbing. Neuro: Alert, answering all questions appropriately. Cranial nerves grossly intact. Skin: No rashes or petechiae noted. Psych: Normal affect.  LAB RESULTS:  Lab Results  Component Value Date   NA 131 (L) 10/15/2022   K 3.9 10/15/2022   CL 98 10/15/2022   CO2 28 10/15/2022   GLUCOSE 90 10/15/2022   BUN 14 10/15/2022   CREATININE 1.02 10/15/2022   CALCIUM 11.3 (H) 10/15/2022   PROT 7.3 10/14/2022   ALBUMIN 3.1 (L) 10/14/2022   AST 34 10/14/2022   ALT 42 10/14/2022   ALKPHOS 307 (H) 10/14/2022   BILITOT 0.5 10/14/2022   GFRNONAA >60 10/15/2022   GFRAA 90 06/10/2020    Lab Results  Component Value Date   WBC 16.8 (H) 10/15/2022   NEUTROABS 12.9 (H) 10/14/2022   HGB 8.2 (L) 10/15/2022   HCT 26.2 (L) 10/15/2022   MCV 93.9 10/15/2022   PLT 490 (H) 10/15/2022     STUDIES: CT ABDOMEN PELVIS WO CONTRAST  Result Date: 10/14/2022 CLINICAL DATA:  Metastatic urothelial cancer. Abdominal pain. * Tracking Code: BO * EXAM: CT ABDOMEN AND PELVIS WITHOUT CONTRAST TECHNIQUE: Multidetector CT imaging of the abdomen and pelvis was performed following the standard protocol without IV contrast. RADIATION DOSE REDUCTION: This exam was performed according to the departmental dose-optimization program which includes automated  exposure control, adjustment of the mA and/or kV according to patient size and/or use of iterative reconstruction technique. COMPARISON:  CT abdomen and pelvis 09/07/2022. Please see separate dictation of chest CT as well from earlier 10/14/2022 FINDINGS: Lower chest: Numerous pulmonary nodules again  identified. These increased from previous. Again please correlate with chest CT scan. Hepatobiliary: On this non IV contrast exam, there is a heterogeneous mass centrally in the right hepatic lobe which is larger today. On series 2, image 19 this measures 4.9 by 4.1 cm and previously measured only 2.2 x 1.6 cm. Other smaller lesions are identified as well in the liver. Less well defined without the advantage of IV contrast. The gallbladder is nondilated. Pancreas: Unremarkable. No pancreatic ductal dilatation or surrounding inflammatory changes. Spleen: Normal in size without focal abnormality. Adrenals/Urinary Tract: Left adrenal nodule again seen. Previously 17 x 15 mm and today 17 x 13 mm, similar. Right adrenal nodule today measures 2.4 by 1.9 cm on series 2, image 31 and previously 15 x 11 mm. There is contrast in the renal collecting system from the prior CT scan of the chest. There is a large infiltrative mass involving the posterior aspect of the right kidney with several adjacent retroperitoneal perinephric space nodules. The renal mass on the prior measured 6.5 x 4.4 cm and today is estimated in diameter on series 2, image 39 of 7.2 x 5.2 cm. The adjacent nodules are increasing as well. Example more caudal on series 2, image 57 measures 16 x 12 mm today and previously 16 x 9 mm. There is confluent abnormal soft tissue extending into the retroperitoneum as well. Preserved contours of the urinary bladder. Left-sided exophytic Bosniak 1 renal cyst laterally. Stomach/Bowel: Large bowel has a normal course and caliber with scattered stool. Few left-sided colonic diverticula. Normal appendix. Stomach and small bowel are nondilated. Vascular/Lymphatic: Normal caliber aorta with scattered vascular calcifications. Branch vessel calcifications are seen as well. There has been placement of the stent extending from the infrarenal IVC down into the common iliac veins. Patency of the stent is limited without the  advantage of contrast. Once again there is abnormal soft tissue in the nodes seen along the retroperitoneum. The area retrocaval which on the prior measured 4.7 x 2.7 cm, today on series 2, image 41 measures 4.7 by 4.1 cm. There is further extension into the adjacent vertebral body as well as seen on series 2, image 42 of the axial data set and on sagittal image 59 of series 6. Several other abnormal nodes throughout the retroperitoneum are also seen. Example at the bifurcation of the IVC measures today 3.3 x 2.4 cm on series 2, image 52 and previously 3.2 x 2.2 cm. Reproductive: Enlarged prostate with mass effect along the base of the bladder. Other: Mild anasarca. Musculoskeletal: Curvature and degenerative changes of the spine. Trace anterolisthesis of L5 on S1 with pars defects. Multilevel stenosis. Again there is bony erosion by adjacent abnormal lymph nodes at the L2 level. IMPRESSION: Overall progression of known neoplasm. Increasing liver metastases. Right-sided renal masses slight larger in the adjacent perinephric space and retroperitoneum nodules are increasing. Abnormal retroperitoneal lymph node enlargement again identified some areas are similar but others are increasing. In addition there is further extension of soft tissue mass extending into the L2 vertebral body. No bowel obstruction or free air.  Normal appendix. Interval placement of the stent from the IVC into the common iliac veins. Evaluation of patency is limited without IV contrast. Increasing right-sided adrenal  nodule.  Stable left. Increasing lung metastases as seen on separate CT scan of the chest Electronically Signed   By: Jill Side M.D.   On: 10/14/2022 13:59   DG Chest Port 1 View  Result Date: 10/14/2022 CLINICAL DATA:  Sepsis.  History of metastatic urothelial cancer EXAM: PORTABLE CHEST 1 VIEW COMPARISON:  Same day chest CT FINDINGS: Right-sided chest port in place with distal tip at the superior cavoatrial junction. Heart  size is normal. Innumerable pulmonary nodules throughout both lungs compatible with known pulmonary metastatic disease. Trace right pleural effusion. No pneumothorax. IMPRESSION: 1. Innumerable pulmonary nodules throughout both lungs compatible with known pulmonary metastatic disease. 2. Trace right pleural effusion. Electronically Signed   By: Davina Poke D.O.   On: 10/14/2022 13:30   CT Angio Chest PE W/Cm &/Or Wo Cm  Result Date: 10/14/2022 CLINICAL DATA:  Chest pain. History of metastatic urothelial cancer. EXAM: CT ANGIOGRAPHY CHEST WITH CONTRAST TECHNIQUE: Multidetector CT imaging of the chest was performed using the standard protocol during bolus administration of intravenous contrast. Multiplanar CT image reconstructions and MIPs were obtained to evaluate the vascular anatomy. RADIATION DOSE REDUCTION: This exam was performed according to the departmental dose-optimization program which includes automated exposure control, adjustment of the mA and/or kV according to patient size and/or use of iterative reconstruction technique. CONTRAST:  38mL OMNIPAQUE IOHEXOL 350 MG/ML SOLN COMPARISON:  CT chest dated September 07, 2022. FINDINGS: Cardiovascular: Satisfactory opacification of the pulmonary arteries to the segmental level. No evidence of pulmonary embolism. Normal heart size. No pericardial effusion. No thoracic aortic aneurysm or dissection. Mediastinum/Nodes: Multiple mediastinal and bilateral hilar lymph nodes are stable to slightly increased in size. For example, a subcarinal lymph node currently measures 1.1 cm in short axis, previously 7 mm. No enlarged axillary lymph nodes. Thyroid gland, trachea, and esophagus demonstrate no significant findings. Lungs/Pleura: Marked interval increase in size and number of widespread pulmonary metastases. Previously noted cavitary lesion in the posterior right lower lobe has resolved. Increased trace right pleural effusion. No pneumothorax. Upper Abdomen:  Large infiltrative mass involving the posterior right kidney is not significantly changed. A similar conglomerate retrocaval lymphadenopathy and perinephric retroperitoneal nodularity. Stable left and enlarging right adrenal metastases. Interval IVC stenting. Musculoskeletal: No chest wall abnormality. No acute or significant osseous findings. Review of the MIP images confirms the above findings. IMPRESSION: 1. No evidence of pulmonary embolism. 2. Progressive widespread pulmonary metastatic disease. 3. Increased trace right pleural effusion. 4. Similar large infiltrative mass involving the posterior right kidney. Similar conglomerate retrocaval lymphadenopathy and perinephric retroperitoneal nodularity. 5. Stable left and enlarging right adrenal metastases. Electronically Signed   By: Titus Dubin M.D.   On: 10/14/2022 13:11   DG Chest 2 View  Result Date: 10/05/2022 CLINICAL DATA:  Cough- urothelial cancer metastatic to lung AND hx of PE. New cough x 1 week; eval for pneumonia EXAM: CHEST - 2 VIEW COMPARISON:  Radiograph 09/07/2022 FINDINGS: Chest port catheter tip overlies the distal SVC. Unchanged cardiomediastinal silhouette. There are diffuse nodular opacities in the lungs compatible with known metastatic disease, appears more prominent in size and number in comparison to recent radiograph. No large effusion or evidence of pneumothorax. No acute osseous abnormality. IMPRESSION: Diffuse nodular opacities consistent with known pulmonary metastatic disease, appears more prominent in size and number in comparison to recent chest radiograph in February, could reflect progression of disease or a superimposed infectious/inflammatory process. Electronically Signed   By: Maurine Simmering M.D.   On: 10/05/2022 11:44  ASSESSMENT:  Progressive stage IV urothelial carcinoma.  PLAN:    Progressive stage IV urothelial carcinoma: CT scan results reviewed independently with progression of disease despite treatment  with chemotherapy.  Hospice was discussed with patient and his family today, but he wishes to attempt a second line treatment at this time.  Patient will return to clinic next week after his discharge for treatment planning. Hypercalcemia: Likely secondary to progressive disease.  Patient received IV Zometa today.  Continue IV fluids.  No further intervention is needed.  Will recheck calcium levels next week. Pain: Continue fentanyl patch 25 mcg every 3 days as well as Norco as needed. Infectious workup: Negative to date.  Case discussed with hospitalist.  Agree with discontinuing antibiotics tomorrow if no source is identified. Anemia: Likely secondary to chemotherapy.  Monitor.   Leukocytosis: Likely reactive.  Monitor.  Appreciate consult, will follow.    Lloyd Huger, MD   10/15/2022 12:23 PM

## 2022-10-15 NOTE — Progress Notes (Signed)
Jeffrey Fernandez at [facility] NAME: Jeffrey Fernandez    MR#:  [id number]  DATE OF BIRTH:  08/19/1955  SUBJECTIVE:  wife at bedside. Patient came in with right flank pain and overall feeling weak. Today no new complaints. Has some moderate off right flank pain. No vomiting. Patient is supposed to be on fentanyl patch for pain as per wife. Complains of cough with white/phlegm   VITALS:  Blood pressure (!) 146/76, pulse 87, temperature 98.4 F (36.9 C), temperature source Oral, resp. rate (!) 23, height 5\' 9"  (1.753 m), weight 64.5 kg, SpO2 97 %.  PHYSICAL EXAMINATION:   GENERAL:  67 y.o.-year-old patient with no acute distress. weak LUNGS: Normal breath sounds bilaterally, no wheezing CARDIOVASCULAR: S1, S2 normal. No murmur   ABDOMEN: Soft, nontender, nondistended. Bowel sounds present.  EXTREMITIES: No  edema b/l.    NEUROLOGIC: nonfocal  patient is alert and awake SKIN: No obvious rash, lesion, or ulcer.   LABORATORY PANEL:  CBC Recent Labs  Lab 10/15/22 0635  WBC 16.8*  HGB 8.2*  HCT 26.2*  PLT 490*    Chemistries  Recent Labs  Lab 10/14/22 1008 10/14/22 1237 10/15/22 0635  NA 129* 129* 131*  K 4.2 4.2 3.9  CL 93* 93* 98  CO2 29 28 28   GLUCOSE 117* 97 90  BUN 22 20 14   CREATININE 1.17 1.16 1.02  CALCIUM 11.6* 11.5* 11.3*  MG 2.0  --   --   AST 39 34  --   ALT 40 42  --   ALKPHOS 321* 307*  --   BILITOT 0.4 0.5  --     RADIOLOGY:  CT ABDOMEN PELVIS WO CONTRAST  Result Date: 10/14/2022 CLINICAL DATA:  Metastatic urothelial cancer. Abdominal pain. * Tracking Code: BO * EXAM: CT ABDOMEN AND PELVIS WITHOUT CONTRAST TECHNIQUE: Multidetector CT imaging of the abdomen and pelvis was performed following the standard protocol without IV contrast. RADIATION DOSE REDUCTION: This exam was performed according to the departmental dose-optimization program which includes automated exposure control, adjustment of the mA and/or kV according to  patient size and/or use of iterative reconstruction technique. COMPARISON:  CT abdomen and pelvis 09/07/2022. Please see separate dictation of chest CT as well from earlier 10/14/2022 FINDINGS: Lower chest: Numerous pulmonary nodules again identified. These increased from previous. Again please correlate with chest CT scan. Hepatobiliary: On this non IV contrast exam, there is a heterogeneous mass centrally in the right hepatic lobe which is larger today. On series 2, image 19 this measures 4.9 by 4.1 cm and previously measured only 2.2 x 1.6 cm. Other smaller lesions are identified as well in the liver. Less well defined without the advantage of IV contrast. The gallbladder is nondilated. Pancreas: Unremarkable. No pancreatic ductal dilatation or surrounding inflammatory changes. Spleen: Normal in size without focal abnormality. Adrenals/Urinary Tract: Left adrenal nodule again seen. Previously 17 x 15 mm and today 17 x 13 mm, similar. Right adrenal nodule today measures 2.4 by 1.9 cm on series 2, image 31 and previously 15 x 11 mm. There is contrast in the renal collecting system from the prior CT scan of the chest. There is a large infiltrative mass involving the posterior aspect of the right kidney with several adjacent retroperitoneal perinephric space nodules. The renal mass on the prior measured 6.5 x 4.4 cm and today is estimated in diameter on series 2, image 39 of 7.2 x 5.2 cm. The adjacent nodules are increasing as  well. Example more caudal on series 2, image 57 measures 16 x 12 mm today and previously 16 x 9 mm. There is confluent abnormal soft tissue extending into the retroperitoneum as well. Preserved contours of the urinary bladder. Left-sided exophytic Bosniak 1 renal cyst laterally. Stomach/Bowel: Large bowel has a normal course and caliber with scattered stool. Few left-sided colonic diverticula. Normal appendix. Stomach and small bowel are nondilated. Vascular/Lymphatic: Normal caliber aorta with  scattered vascular calcifications. Branch vessel calcifications are seen as well. There has been placement of the stent extending from the infrarenal IVC down into the common iliac veins. Patency of the stent is limited without the advantage of contrast. Once again there is abnormal soft tissue in the nodes seen along the retroperitoneum. The area retrocaval which on the prior measured 4.7 x 2.7 cm, today on series 2, image 41 measures 4.7 by 4.1 cm. There is further extension into the adjacent vertebral body as well as seen on series 2, image 42 of the axial data set and on sagittal image 59 of series 6. Several other abnormal nodes throughout the retroperitoneum are also seen. Example at the bifurcation of the IVC measures today 3.3 x 2.4 cm on series 2, image 52 and previously 3.2 x 2.2 cm. Reproductive: Enlarged prostate with mass effect along the base of the bladder. Other: Mild anasarca. Musculoskeletal: Curvature and degenerative changes of the spine. Trace anterolisthesis of L5 on S1 with pars defects. Multilevel stenosis. Again there is bony erosion by adjacent abnormal lymph nodes at the L2 level. IMPRESSION: Overall progression of known neoplasm. Increasing liver metastases. Right-sided renal masses slight larger in the adjacent perinephric space and retroperitoneum nodules are increasing. Abnormal retroperitoneal lymph node enlargement again identified some areas are similar but others are increasing. In addition there is further extension of soft tissue mass extending into the L2 vertebral body. No bowel obstruction or free air.  Normal appendix. Interval placement of the stent from the IVC into the common iliac veins. Evaluation of patency is limited without IV contrast. Increasing right-sided adrenal nodule.  Stable left. Increasing lung metastases as seen on separate CT scan of the chest Electronically Signed   By: Jill Side M.D.   On: 10/14/2022 13:59   DG Chest Port 1 View  Result Date:  10/14/2022 CLINICAL DATA:  Sepsis.  History of metastatic urothelial cancer EXAM: PORTABLE CHEST 1 VIEW COMPARISON:  Same day chest CT FINDINGS: Right-sided chest port in place with distal tip at the superior cavoatrial junction. Heart size is normal. Innumerable pulmonary nodules throughout both lungs compatible with known pulmonary metastatic disease. Trace right pleural effusion. No pneumothorax. IMPRESSION: 1. Innumerable pulmonary nodules throughout both lungs compatible with known pulmonary metastatic disease. 2. Trace right pleural effusion. Electronically Signed   By: Davina Poke D.O.   On: 10/14/2022 13:30   CT Angio Chest PE W/Cm &/Or Wo Cm  Result Date: 10/14/2022 CLINICAL DATA:  Chest pain. History of metastatic urothelial cancer. EXAM: CT ANGIOGRAPHY CHEST WITH CONTRAST TECHNIQUE: Multidetector CT imaging of the chest was performed using the standard protocol during bolus administration of intravenous contrast. Multiplanar CT image reconstructions and MIPs were obtained to evaluate the vascular anatomy. RADIATION DOSE REDUCTION: This exam was performed according to the departmental dose-optimization program which includes automated exposure control, adjustment of the mA and/or kV according to patient size and/or use of iterative reconstruction technique. CONTRAST:  25mL OMNIPAQUE IOHEXOL 350 MG/ML SOLN COMPARISON:  CT chest dated September 07, 2022. FINDINGS: Cardiovascular: Satisfactory  opacification of the pulmonary arteries to the segmental level. No evidence of pulmonary embolism. Normal heart size. No pericardial effusion. No thoracic aortic aneurysm or dissection. Mediastinum/Nodes: Multiple mediastinal and bilateral hilar lymph nodes are stable to slightly increased in size. For example, a subcarinal lymph node currently measures 1.1 cm in short axis, previously 7 mm. No enlarged axillary lymph nodes. Thyroid gland, trachea, and esophagus demonstrate no significant findings. Lungs/Pleura:  Marked interval increase in size and number of widespread pulmonary metastases. Previously noted cavitary lesion in the posterior right lower lobe has resolved. Increased trace right pleural effusion. No pneumothorax. Upper Abdomen: Large infiltrative mass involving the posterior right kidney is not significantly changed. A similar conglomerate retrocaval lymphadenopathy and perinephric retroperitoneal nodularity. Stable left and enlarging right adrenal metastases. Interval IVC stenting. Musculoskeletal: No chest wall abnormality. No acute or significant osseous findings. Review of the MIP images confirms the above findings. IMPRESSION: 1. No evidence of pulmonary embolism. 2. Progressive widespread pulmonary metastatic disease. 3. Increased trace right pleural effusion. 4. Similar large infiltrative mass involving the posterior right kidney. Similar conglomerate retrocaval lymphadenopathy and perinephric retroperitoneal nodularity. 5. Stable left and enlarging right adrenal metastases. Electronically Signed   By: Titus Dubin M.D.   On: 10/14/2022 13:11    Assessment and Plan Jaivin Silerio is a 67 year old male with history of urothelial carcinoma with metastasis to liver and lungs, hyperlipidemia, hypertension, history of DVT on Eliquis, who presents to the emergency department from cancer center for chief concerns of chest pain, productive cough for 2 days.   Pt says he is having productive cough of milky/white sputum for two weeks. He developed chest pain two days ago that is worse with the cough.  He was prescribed levofloxacin bid, and complete course 5 day course on Friday, 10/09/22.  SIRS (systemic inflammatory response syndrome) Etiology  unclear.-- Will continue IV cefepime for now. Patient remains afebrile. Discussed with Dr. Grayland Ormond if no infectious source found will change to Levaquin to finish five day course as prescribed from the cancer center --Blood cultures x 2  negative --procalcitonin 0.35, MRSA PCR screening --DuoNebs, 2 treatments scheduled for day of admission DuoNebs every 6 hours as needed for shortness of breath and wheezing ordered for 10/15/2022  Hypercalcemia -- persistent hypercalcemia has been due to cancer. -- Discussed with Dr. Grayland Ormond give one dose of IV zometa-- will take couple days for levels to slow down this will be checked at the cancer center per oncology. -- Continue IV hydration  Stage IV urothelial carcinoma  Cancer associated pain -- suspect suprapubic pain, right groin, right low back pain is cancer associated pain given CT read of enlarging prostate with mass effect into the bladder, enlarging right sided renal mass, increasing liver metastasis -- fentanyl patch at PO narcotics for now.   Elevated troponin --suspect this is demand ischemia in setting of patient meeting criteria for SIRS Reassuring that the elevated high sensitive troponin is downtrending Continue to monitor   Leukocytosis Treat per above with broad-spectrum antibiotic, repeat CBC in the a.m.   Insomnia Trazodone 25 mg nightly as needed for sleep resumed   Chronic hyponatremia At baseline, in setting of metastatic urothelial cancer with metastasis to lung and liver and lymph nodes   DVT (deep venous thrombosis) Patient endorses compliance with home anticoagulation, Eliquis   Essential (primary) hypertension Cont Metoprolol succinate     DVT prophylaxis: Eliquis  Code Status: full code Diet: Heart healthy Family Communication: updated wife at bedside Level of care:  Progressive Status is: Inpatient Remains inpatient appropriate because: SIRS, elevated Calcium    TOTAL TIME TAKING CARE OF THIS PATIENT: 35 minutes.  >50% time spent on counselling and coordination of care  Note: This dictation was prepared with Dragon dictation along with smaller phrase technology. Any transcriptional errors that result from this process are  unintentional.  Fritzi Mandes M.D    [facility]   CC: Primary care physician; Birdie Sons, MD

## 2022-10-16 DIAGNOSIS — R6510 Systemic inflammatory response syndrome (SIRS) of non-infectious origin without acute organ dysfunction: Secondary | ICD-10-CM | POA: Diagnosis not present

## 2022-10-16 DIAGNOSIS — C679 Malignant neoplasm of bladder, unspecified: Secondary | ICD-10-CM

## 2022-10-16 MED ORDER — CHLORHEXIDINE GLUCONATE CLOTH 2 % EX PADS
6.0000 | MEDICATED_PAD | Freq: Every day | CUTANEOUS | Status: DC
  Administered 2022-10-16 – 2022-10-17 (×2): 6 via TOPICAL

## 2022-10-16 NOTE — Plan of Care (Signed)
Patient AOX4, VSS throughout shift.  Pt c/o pain relieved by PRN norco.  All meds given on time as ordered.  Diminished lungs, IS encouraged.  Pt voided in urinal.  POC maintained, will continue to monitor.  Problem: Fluid Volume: Goal: Hemodynamic stability will improve Outcome: Progressing   Problem: Clinical Measurements: Goal: Diagnostic test results will improve Outcome: Progressing Goal: Signs and symptoms of infection will decrease Outcome: Progressing   Problem: Respiratory: Goal: Ability to maintain adequate ventilation will improve Outcome: Progressing   Problem: Education: Goal: Knowledge of General Education information will improve Description: Including pain rating scale, medication(s)/side effects and non-pharmacologic comfort measures Outcome: Progressing   Problem: Health Behavior/Discharge Planning: Goal: Ability to manage health-related needs will improve Outcome: Progressing   Problem: Clinical Measurements: Goal: Ability to maintain clinical measurements within normal limits will improve Outcome: Progressing Goal: Will remain free from infection Outcome: Progressing Goal: Diagnostic test results will improve Outcome: Progressing Goal: Respiratory complications will improve Outcome: Progressing Goal: Cardiovascular complication will be avoided Outcome: Progressing   Problem: Activity: Goal: Risk for activity intolerance will decrease Outcome: Progressing   Problem: Nutrition: Goal: Adequate nutrition will be maintained Outcome: Progressing   Problem: Coping: Goal: Level of anxiety will decrease Outcome: Progressing   Problem: Elimination: Goal: Will not experience complications related to bowel motility Outcome: Progressing Goal: Will not experience complications related to urinary retention Outcome: Progressing   Problem: Pain Managment: Goal: General experience of comfort will improve Outcome: Progressing   Problem: Safety: Goal:  Ability to remain free from injury will improve Outcome: Progressing   Problem: Skin Integrity: Goal: Risk for impaired skin integrity will decrease Outcome: Progressing

## 2022-10-16 NOTE — Progress Notes (Addendum)
[facility]  - [place] at [facility]   PATIENT NAME: Jeffrey Fernandez    MR#:  [id number]  DATE OF BIRTH:  [birth year]  SUBJECTIVE:  wife at bedside. Patient came in with right flank pain and overall feeling weak. Has some moderate right flank pain. No vomiting. Complains of cough with white/phlegm Sats drops with activity  VITALS:  Blood pressure 116/74, pulse (!) 108, temperature 99.3 F (37.4 C), resp. rate 16, height 5\' 9"  (1.753 m), weight 70.4 kg, SpO2 96 %.  PHYSICAL EXAMINATION:   GENERAL:  67 y.o.-year-old patient with no acute distress. weak LUNGS: Normal breath sounds bilaterally, no wheezing CARDIOVASCULAR: S1, S2 normal. No murmur   ABDOMEN: Soft, nontender, nondistended. Bowel sounds present.  EXTREMITIES: No  edema b/l.    NEUROLOGIC: nonfocal  patient is alert and awake SKIN: No obvious rash, lesion, or ulcer.   LABORATORY PANEL:  CBC Recent Labs  Lab 10/15/22 0635  WBC 16.8*  HGB 8.2*  HCT 26.2*  PLT 490*     Chemistries  Recent Labs  Lab 10/14/22 1008 10/14/22 1237 10/15/22 0635  NA 129* 129* 131*  K 4.2 4.2 3.9  CL 93* 93* 98  CO2 29 28 28   GLUCOSE 117* 97 90  BUN 22 20 14   CREATININE 1.17 1.16 1.02  CALCIUM 11.6* 11.5* 11.3*  MG 2.0  --   --   AST 39 34  --   ALT 40 42  --   ALKPHOS 321* 307*  --   BILITOT 0.4 0.5  --      RADIOLOGY:  No results found.  Assessment and Plan Jeffrey Fernandez is a 67 year old male with history of urothelial carcinoma with metastasis to liver and lungs, hyperlipidemia, hypertension, history of DVT on Eliquis, who presents to the emergency department from cancer center for chief concerns of chest pain, productive cough for 2 days.   Pt says he is having productive cough of milky/white sputum for two weeks. He developed chest pain two days ago that is worse with the cough.  He was prescribed levofloxacin bid, and complete course 5 day course on Friday, 10/09/22.  SIRS (systemic inflammatory  response syndrome) Etiology unclear.-- Will continue IV cefepime for now. Patient remains afebrile. Discussed with Dr. Orlie Dakin if no infectious source found will change to Levaquin to finish five day course as prescribed from the cancer center --Blood cultures x 2 negative --procalcitonin 0.35, MRSA PCR screening --DuoNebs, 2 treatments scheduled for day of admission DuoNebs every 6 hours as needed for shortness of breath and wheezing ordered for 10/15/2022 --will d/c abxs for now and monitor --pt just finished course of Levaquin last week.  Hypercalcemia -- persistent hypercalcemia has been due to cancer. -- Discussed with Dr. Orlie Dakin give one dose of IV zometa-- will take couple days for levels to slow down this will be checked at the cancer center per oncology. -- Continue IV hydration  Stage IV urothelial carcinoma with widespread metastasis Cancer associated pain -- suspect suprapubic pain, right groin, right low back pain is cancer associated pain given CT read of enlarging prostate with mass effect into the bladder, enlarging right sided renal mass, increasing liver metastasis -- fentanyl patch at PO narcotics for now. -- Discussed with Dr. Orlie Dakin. Will increase fentanyl patch to 50 g if patient continues to be in pain. -- Patient desaturase in the upper 80s easily with exertion on room air. Will need oxygen to go home with this time.   Elevated troponin --suspect  this is demand ischemia in setting of patient meeting criteria for SIRS Reassuring that the elevated high sensitive troponin is downtrending   insomnia Trazodone 25 mg nightly as needed for sleep resumed   Chronic hyponatremia At baseline, in setting of metastatic urothelial cancer with metastasis to lung and liver and lymph nodes   DVT (deep venous thrombosis) Patient endorses compliance with home anticoagulation, Eliquis   Essential (primary) hypertension Cont Metoprolol succinate   Overall poor prognosis.  Patient is followed by palliative care at the cancer center.  DVT prophylaxis: Eliquis  Code Status: full code Diet: Heart healthy Family Communication: updated wife at bedside Level of care: Progressive Status is: Inpatient Remains inpatient appropriate because: SIRS, cancer related pain control    TOTAL TIME TAKING CARE OF THIS PATIENT: 35 minutes.  >50% time spent on counselling and coordination of care  Note: This dictation was prepared with Dragon dictation along with smaller phrase technology. Any transcriptional errors that result from this process are unintentional.  Enedina FinnerSona Catherin Doorn M.D    [facility]   CC: Primary care physician; Malva LimesFisher, Dadrian E, MD

## 2022-10-16 NOTE — TOC Initial Note (Signed)
Transition of Care [facility]) - Initial/Assessment Note    Patient Details  Name: Jeffrey Fernandez MRN: [id number] Date of Birth: 1956-06-07  Transition of Care [facility]) CM/SW Contact:    Jeffrey Hidden, RN Phone Number: 10/16/2022, 3:06 PM  Clinical Narrative:                 Case reviewed for needs and disposition. Patient is from home. Spoke with his spouse at bedside. She inquired about starting disability and social security applications. She was referred to the Chartered loss adjuster for more assistance. She stated she was already by them how to start the application. She denies any other questions.         Patient Goals and CMS Choice            Expected Discharge Plan and Services                                              Prior Living Arrangements/Services                       Activities of Daily Living      Permission Sought/Granted                  Emotional Assessment              Admission diagnosis:  Shortness of breath [R06.02] Cancer associated pain [G89.3] Productive cough [R05.8] SIRS (systemic inflammatory response syndrome) [R65.10] Nonspecific chest pain [R07.9] Patient Active Problem List   Diagnosis Date Noted   SIRS (systemic inflammatory response syndrome) 10/14/2022   Chronic hyponatremia 10/14/2022   Insomnia 10/14/2022   Leukocytosis 10/14/2022   Elevated troponin 10/14/2022   Cancer associated pain 10/14/2022   Syncope 09/11/2022   Palliative care encounter 09/08/2022   DVT (deep venous thrombosis) 09/07/2022   Urothelial carcinoma of kidney 08/19/2022   Enlarged prostate 06/26/2020   Hematuria 06/10/2020   Aortic atherosclerosis 12/13/2017   Emphysema of lung 12/13/2017   Gastroesophageal reflux disease 11/17/2017   Stopped smoking with greater than 30 pack year history 03/20/2016   Personal history of bladder cancer 03/19/2016   Hyperlipidemia 03/19/2016   Diverticulitis of sigmoid  colon 03/19/2016   Synovial cyst of popliteal space 03/19/2016   Benign localized hyperplasia of prostate with urinary obstruction and other lower urinary tract symptoms (LUTS)(600.21) 03/08/2013   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 [place] [address] Phone: [phone] Fax: [phone]     Social Determinants of Health (SDOH) Social History: SDOH Screenings   Food Insecurity: No Food Insecurity (09/07/2022)  Housing: Low Risk  (09/07/2022)  Transportation Needs: No Transportation Needs (09/07/2022)  Utilities: Not At Risk (09/07/2022)  Alcohol Screen: Low Risk  (12/02/2021)  Depression (PHQ2-9): Low Risk  (12/02/2021)  Tobacco Use: Medium Risk (10/14/2022)   SDOH Interventions:     Readmission Risk Interventions     No data to display

## 2022-10-17 DIAGNOSIS — R6510 Systemic inflammatory response syndrome (SIRS) of non-infectious origin without acute organ dysfunction: Secondary | ICD-10-CM | POA: Diagnosis not present

## 2022-10-17 MED ORDER — HYDROCODONE-ACETAMINOPHEN 5-325 MG PO TABS
1.0000 | ORAL_TABLET | ORAL | Status: DC | PRN
  Administered 2022-10-17: 2 via ORAL
  Filled 2022-10-17: qty 2

## 2022-10-17 MED ORDER — HEPARIN SOD (PORK) LOCK FLUSH 100 UNIT/ML IV SOLN
500.0000 [IU] | Freq: Once | INTRAVENOUS | Status: AC
  Administered 2022-10-17: 500 [IU] via INTRAVENOUS
  Filled 2022-10-17: qty 5

## 2022-10-17 MED ORDER — FENTANYL 50 MCG/HR TD PT72
1.0000 | MEDICATED_PATCH | TRANSDERMAL | Status: DC | PRN
  Administered 2022-10-17: 1 via TRANSDERMAL

## 2022-10-17 MED ORDER — MORPHINE SULFATE (PF) 4 MG/ML IV SOLN
4.0000 mg | Freq: Once | INTRAVENOUS | Status: AC
  Administered 2022-10-17: 4 mg via INTRAVENOUS
  Filled 2022-10-17: qty 1

## 2022-10-17 MED ORDER — HYDROCODONE-ACETAMINOPHEN 5-325 MG PO TABS: 1.0000 | ORAL_TABLET | ORAL | 0 refills | Status: DC | PRN

## 2022-10-17 MED ORDER — DOCUSATE SODIUM 100 MG PO CAPS
100.0000 mg | ORAL_CAPSULE | Freq: Two times a day (BID) | ORAL | Status: DC
  Administered 2022-10-17: 100 mg via ORAL
  Filled 2022-10-17: qty 1

## 2022-10-17 MED ORDER — FENTANYL 50 MCG/HR TD PT72: 1.0000 | MEDICATED_PATCH | TRANSDERMAL | 0 refills | Status: DC | PRN

## 2022-10-17 NOTE — Discharge Summary (Signed)
Physician Discharge Summary   Patient: Jeffrey Fernandez MRN: [id number] DOB: Nov 25, 1955  Admit date:     10/14/2022  Discharge date: 10/17/22  Discharge Physician: Enedina Finner   PCP: Malva Limes, MD   Recommendations at discharge:   follow-up PCP in one week follow-up Dr. Orlie Dakin earlier next upcoming appointment. Your blood work will need to be checked. Use your oxygen as instructed  Discharge Diagnoses: Principal Problem:   SIRS (systemic inflammatory response syndrome) Active Problems:   Essential (primary) hypertension   Hyperlipidemia   ED (erectile dysfunction) of organic origin   Gastroesophageal reflux disease   Aortic atherosclerosis   Emphysema of lung   Urothelial carcinoma of kidney   DVT (deep venous thrombosis)   Chronic hyponatremia   Insomnia   Leukocytosis   Elevated troponin   Cancer associated pain  Assessment and Plan Jeffrey Fernandez is a 67 year old male with history of urothelial carcinoma with metastasis to liver and lungs, hyperlipidemia, hypertension, history of DVT on Eliquis, who presents to the emergency department from cancer center for chief concerns of chest pain, productive cough for 2 days.   Pt says he is having productive cough of milky/white sputum for two weeks. He developed chest pain two days ago that is worse with the cough.  He was prescribed levofloxacin bid, and complete course 5 day course on Friday, 10/09/22.    Hypercalcemia -- persistent hypercalcemia has been due to cancer. -- Discussed with Dr. Orlie Dakin give one dose of IV zometa-- will take couple days for levels to slow down this will be checked at the cancer center per oncology. 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 -- Discussed with Dr. Orlie Dakin. Will increase fentanyl patch to 50 g  and Norco 1-2 tabs 1 4 prn --recommend use stool softner -- Patient desaturats in the upper 80s easily with exertion on room air. Will need oxygen to go home with this time.  SIRS (systemic inflammatory response syndrome) Etiology unclear.-- Will continue IV cefepime for now. Patient remains afebrile. Discussed with Dr. Orlie Dakin if no infectious source found will change to Levaquin to finish five day course as prescribed from the cancer center --Blood cultures x 2 negative --procalcitonin 0.35, MRSA PCR screening --DuoNebs, 2 treatments scheduled for day of admission DuoNebs every 6 hours as needed for shortness of breath and wheezing ordered for 10/15/2022 --will d/c abxs for now and monitor --pt just finished course of Levaquin last week.   Elevated troponin --suspect this is demand ischemia in setting of patient meeting criteria for SIRS Reassuring that the elevated high sensitive troponin is downtrending    Chronic hyponatremia At baseline, in setting of metastatic urothelial cancer with metastasis to lung and liver and lymph nodes   DVT (deep venous thrombosis) Patient endorses compliance with home anticoagulation, Eliquis   Essential (primary) hypertension Cont Metoprolol succinate    Overall poor prognosis. Patient is followed by palliative care at the cancer center.   DVT prophylaxis: Eliquis  Code Status: full code Diet: Heart healthy Family Communication: updated wife at bedside  D/w pt and wife, pain meds adjusted. Pt will go home with oxygen and f/u Dr Orlie Dakin next week.  Both in agreement     Pain control - [facility] Controlled Substance Reporting System database was reviewed. and patient was instructed, not to drive, operate heavy machinery, perform activities at heights, swimming or  participation in water activities or provide baby-sitting services while on Pain, Sleep and Anxiety Medications; until their outpatient Physician has advised to do so again. Also recommended to not to take more than prescribed Pain, Sleep and Anxiety Medications.  Consultants: Oncology Procedures performed: none  Disposition: Home Diet recommendation:  Discharge Diet Orders (From admission, onward)     Start     Ordered   10/17/22 0000  Diet - low sodium heart healthy        10/17/22 1235           Regular diet DISCHARGE MEDICATION: Allergies as of 10/17/2022       Reactions   Amoxicillin Itching   Atenolol Diarrhea   Other reaction(s): HIVES   Other Nausea And Vomiting   Navy Beans   Oxycodone    Other reaction(s): HIVES   Shellfish Allergy Itching, Swelling        Medication List     STOP taking these medications    fentaNYL 25 MCG/HR Commonly known as: DURAGESIC Replaced by: fentaNYL 50 MCG/HR   ondansetron 8 MG disintegrating tablet Commonly known as: ZOFRAN-ODT       TAKE these medications    acetaminophen 500 MG tablet Commonly known as: TYLENOL Take 500 mg by mouth every 6 (six) hours as needed.   apixaban 5 MG Tabs tablet Commonly known as: ELIQUIS Take 1 tablet (5 mg total) by mouth 2 (two) times daily.   docusate sodium 250 MG capsule Commonly known as: COLACE Take 250 mg by mouth daily.   fentaNYL 50 MCG/HR Commonly known as: DURAGESIC Place 1 patch onto the skin every 3 (three) days as needed. Replaces: fentaNYL 25 MCG/HR   HYDROcodone-acetaminophen 5-325 MG tablet Commonly known as: Norco Take 1-2 tablets by mouth every 4 (four) hours as needed for moderate pain. What changed:  how much to take when to take this   lidocaine-prilocaine cream Commonly known as: EMLA Apply to affected area once   metoCLOPramide 10 MG tablet Commonly known as: REGLAN Take 1 tablet (10 mg total) by mouth 4 (four) times daily.   metoprolol  succinate 25 MG 24 hr tablet Commonly known as: TOPROL-XL TAKE 1 TABLET (25 MG TOTAL) BY MOUTH DAILY.   ondansetron 8 MG tablet Commonly known as: ZOFRAN Take 1 tablet (8 mg total) by mouth every 8 (eight) hours as needed for nausea or vomiting.   pantoprazole 40 MG tablet Commonly known as: PROTONIX TAKE 1 TABLET BY MOUTH EVERY DAY   prochlorperazine 10 MG tablet Commonly known as: COMPAZINE Take 1 tablet (10 mg total) by mouth every 6 (six) hours as needed (Nausea or vomiting).   tamsulosin 0.4 MG Caps capsule Commonly known as: FLOMAX Take 1 capsule (0.4 mg total) by mouth daily.   traZODone 50 MG tablet Commonly known as: DESYREL Take 0.5 tablets (25 mg total) by mouth at bedtime as needed for sleep.               Durable Medical Equipment  (From admission, onward)           Start     Ordered   10/17/22 1240  For home use only DME oxygen  Once       Question Answer Comment  Length of Need Lifetime  Mode or (Route) Nasal cannula   Liters per Minute 3   Frequency Continuous (stationary and portable oxygen unit needed)   Oxygen conserving device Yes   Oxygen delivery system Gas      10/17/22 1239            Filed Weights   10/14/22 1213 10/16/22 0450  Weight: 64.5 kg 70.4 kg     Condition at discharge: stable  The results of significant diagnostics from this hospitalization (including imaging, microbiology, ancillary and laboratory) are listed below for reference.   Imaging Studies: CT ABDOMEN PELVIS WO CONTRAST  Result Date: 10/14/2022 CLINICAL DATA:  Metastatic urothelial cancer. Abdominal pain. * Tracking Code: BO * EXAM: CT ABDOMEN AND PELVIS WITHOUT CONTRAST TECHNIQUE: Multidetector CT imaging of the abdomen and pelvis was performed following the standard protocol without IV contrast. RADIATION DOSE REDUCTION: This exam was performed according to the departmental dose-optimization program which includes automated exposure control, adjustment  of the mA and/or kV according to patient size and/or use of iterative reconstruction technique. COMPARISON:  CT abdomen and pelvis 09/07/2022. Please see separate dictation of chest CT as well from earlier 10/14/2022 FINDINGS: Lower chest: Numerous pulmonary nodules again identified. These increased from previous. Again please correlate with chest CT scan. Hepatobiliary: On this non IV contrast exam, there is a heterogeneous mass centrally in the right hepatic lobe which is larger today. On series 2, image 19 this measures 4.9 by 4.1 cm and previously measured only 2.2 x 1.6 cm. Other smaller lesions are identified as well in the liver. Less well defined without the advantage of IV contrast. The gallbladder is nondilated. Pancreas: Unremarkable. No pancreatic ductal dilatation or surrounding inflammatory changes. Spleen: Normal in size without focal abnormality. Adrenals/Urinary Tract: Left adrenal nodule again seen. Previously 17 x 15 mm and today 17 x 13 mm, similar. Right adrenal nodule today measures 2.4 by 1.9 cm on series 2, image 31 and previously 15 x 11 mm. There is contrast in the renal collecting system from the prior CT scan of the chest. There is a large infiltrative mass involving the posterior aspect of the right kidney with several adjacent retroperitoneal perinephric space nodules. The renal mass on the prior measured 6.5 x 4.4 cm and today is estimated in diameter on series 2, image 39 of 7.2 x 5.2 cm. The adjacent nodules are increasing as well. Example more caudal on series 2, image 57 measures 16 x 12 mm today and previously 16 x 9 mm. There is confluent abnormal soft tissue extending into the retroperitoneum as well. Preserved contours of the urinary bladder. Left-sided exophytic Bosniak 1 renal cyst laterally. Stomach/Bowel: Large bowel has a normal course and caliber with scattered stool. Few left-sided colonic diverticula. Normal appendix. Stomach and small bowel are nondilated.  Vascular/Lymphatic: Normal caliber aorta with scattered vascular calcifications. Branch vessel calcifications are seen as well. There has been placement of the stent extending from the infrarenal IVC down into the common iliac veins. Patency of the stent is limited without the advantage of contrast. Once again there is abnormal soft tissue in the nodes seen along the retroperitoneum. The area retrocaval which on the prior measured 4.7 x 2.7 cm, today on series 2, image 41 measures 4.7 by 4.1 cm. There is further extension into the adjacent vertebral body as well as seen on series 2, image 42 of the axial data set and on sagittal image 59 of series 6. Several other abnormal nodes throughout the retroperitoneum are also seen.  Example at the bifurcation of the IVC measures today 3.3 x 2.4 cm on series 2, image 52 and previously 3.2 x 2.2 cm. Reproductive: Enlarged prostate with mass effect along the base of the bladder. Other: Mild anasarca. Musculoskeletal: Curvature and degenerative changes of the spine. Trace anterolisthesis of L5 on S1 with pars defects. Multilevel stenosis. Again there is bony erosion by adjacent abnormal lymph nodes at the L2 level. IMPRESSION: Overall progression of known neoplasm. Increasing liver metastases. Right-sided renal masses slight larger in the adjacent perinephric space and retroperitoneum nodules are increasing. Abnormal retroperitoneal lymph node enlargement again identified some areas are similar but others are increasing. In addition there is further extension of soft tissue mass extending into the L2 vertebral body. No bowel obstruction or free air.  Normal appendix. Interval placement of the stent from the IVC into the common iliac veins. Evaluation of patency is limited without IV contrast. Increasing right-sided adrenal nodule.  Stable left. Increasing lung metastases as seen on separate CT scan of the chest Electronically Signed   By: Karen KaysAshok  Gupta M.D.   On: 10/14/2022 13:59    DG Chest Port 1 View  Result Date: 10/14/2022 CLINICAL DATA:  Sepsis.  History of metastatic urothelial cancer EXAM: PORTABLE CHEST 1 VIEW COMPARISON:  Same day chest CT FINDINGS: Right-sided chest port in place with distal tip at the superior cavoatrial junction. Heart size is normal. Innumerable pulmonary nodules throughout both lungs compatible with known pulmonary metastatic disease. Trace right pleural effusion. No pneumothorax. IMPRESSION: 1. Innumerable pulmonary nodules throughout both lungs compatible with known pulmonary metastatic disease. 2. Trace right pleural effusion. Electronically Signed   By: Duanne GuessNicholas  Plundo D.O.   On: 10/14/2022 13:30   CT Angio Chest PE W/Cm &/Or Wo Cm  Result Date: 10/14/2022 CLINICAL DATA:  Chest pain. History of metastatic urothelial cancer. EXAM: CT ANGIOGRAPHY CHEST WITH CONTRAST TECHNIQUE: Multidetector CT imaging of the chest was performed using the standard protocol during bolus administration of intravenous contrast. Multiplanar CT image reconstructions and MIPs were obtained to evaluate the vascular anatomy. RADIATION DOSE REDUCTION: This exam was performed according to the departmental dose-optimization program which includes automated exposure control, adjustment of the mA and/or kV according to patient size and/or use of iterative reconstruction technique. CONTRAST:  50mL OMNIPAQUE IOHEXOL 350 MG/ML SOLN COMPARISON:  CT chest dated September 07, 2022. FINDINGS: Cardiovascular: Satisfactory opacification of the pulmonary arteries to the segmental level. No evidence of pulmonary embolism. Normal heart size. No pericardial effusion. No thoracic aortic aneurysm or dissection. Mediastinum/Nodes: Multiple mediastinal and bilateral hilar lymph nodes are stable to slightly increased in size. For example, a subcarinal lymph node currently measures 1.1 cm in short axis, previously 7 mm. No enlarged axillary lymph nodes. Thyroid gland, trachea, and esophagus demonstrate  no significant findings. Lungs/Pleura: Marked interval increase in size and number of widespread pulmonary metastases. Previously noted cavitary lesion in the posterior right lower lobe has resolved. Increased trace right pleural effusion. No pneumothorax. Upper Abdomen: Large infiltrative mass involving the posterior right kidney is not significantly changed. A similar conglomerate retrocaval lymphadenopathy and perinephric retroperitoneal nodularity. Stable left and enlarging right adrenal metastases. Interval IVC stenting. Musculoskeletal: No chest wall abnormality. No acute or significant osseous findings. Review of the MIP images confirms the above findings. IMPRESSION: 1. No evidence of pulmonary embolism. 2. Progressive widespread pulmonary metastatic disease. 3. Increased trace right pleural effusion. 4. Similar large infiltrative mass involving the posterior right kidney. Similar conglomerate retrocaval lymphadenopathy and perinephric retroperitoneal nodularity.  5. Stable left and enlarging right adrenal metastases. Electronically Signed   By: Obie DredgeWilliam T Derry M.D.   On: 10/14/2022 13:11   DG Chest 2 View  Result Date: 10/05/2022 CLINICAL DATA:  Cough- urothelial cancer metastatic to lung AND hx of PE. New cough x 1 week; eval for pneumonia EXAM: CHEST - 2 VIEW COMPARISON:  Radiograph 09/07/2022 FINDINGS: Chest port catheter tip overlies the distal SVC. Unchanged cardiomediastinal silhouette. There are diffuse nodular opacities in the lungs compatible with known metastatic disease, appears more prominent in size and number in comparison to recent radiograph. No large effusion or evidence of pneumothorax. No acute osseous abnormality. IMPRESSION: Diffuse nodular opacities consistent with known pulmonary metastatic disease, appears more prominent in size and number in comparison to recent chest radiograph in February, could reflect progression of disease or a superimposed infectious/inflammatory process.  Electronically Signed   By: Caprice RenshawJacob  Kahn M.D.   On: 10/05/2022 11:44    Microbiology: Results for orders placed or performed during the hospital encounter of 10/14/22  Blood Culture (routine x 2)     Status: None (Preliminary result)   Collection Time: 10/14/22 12:37 PM   Specimen: BLOOD  Result Value Ref Range Status   Specimen Description BLOOD LEFT ANTECUBITAL  Final   Special Requests   Final    BOTTLES DRAWN AEROBIC AND ANAEROBIC Blood Culture adequate volume   Culture   Final    NO GROWTH 3 DAYS Performed at [facility], [address] [place], [place] [address]    Report Status PENDING  Incomplete  Blood Culture (routine x 2)     Status: None (Preliminary result)   Collection Time: 10/14/22 12:37 PM   Specimen: BLOOD  Result Value Ref Range Status   Specimen Description BLOOD RIGHT ANTECUBITAL  Final   Special Requests   Final    BOTTLES DRAWN AEROBIC AND ANAEROBIC Blood Culture adequate volume   Culture   Final    NO GROWTH 3 DAYS Performed at [facility], [address] [place], [place] [address]    Report Status PENDING  Incomplete  SARS Coronavirus 2 by RT PCR (hospital order, performed in [facility]) *cepheid single result test* Anterior Nasal Swab     Status: None   Collection Time: 10/14/22  2:50 PM   Specimen: Anterior Nasal Swab  Result Value Ref Range Status   SARS Coronavirus 2 by RT PCR NEGATIVE NEGATIVE Final    Comment: (NOTE) SARS-CoV-2 target nucleic acids are NOT DETECTED.  The SARS-CoV-2 RNA is generally detectable in upper and lower respiratory specimens during the acute phase of infection. The lowest concentration of SARS-CoV-2 viral copies this assay can detect is 250 copies / mL. A negative result does not preclude SARS-CoV-2 infection and should not be used as the sole basis for treatment or other patient management decisions.  A negative result may occur with improper specimen collection / handling,  submission of specimen other than nasopharyngeal swab, presence of viral mutation(s) within the areas targeted by this assay, and inadequate number of viral copies (<250 copies / mL). A negative result must be combined with clinical observations, patient history, and epidemiological information.  Fact Sheet for Patients:   RoadLapTop.co.zahttps://www.fda.gov/media/158405/download  Fact Sheet for Healthcare Providers: http://kim-miller.com/https://www.fda.gov/media/158404/download  This test is not yet approved or  cleared by the [place] FDA and has been authorized for detection and/or diagnosis of SARS-CoV-2 by FDA under an Emergency Use Authorization (EUA).  This EUA will remain in effect (  meaning this test can be used) for the duration of the COVID-19 declaration under Section 564(b)(1) of the Act, 21 U.S.C. section 360bbb-3(b)(1), unless the authorization is terminated or revoked sooner.  Performed at [facility], [address] [place], [place] [address]     Labs: CBC: Recent Labs  Lab 10/12/22 1307 10/14/22 1008 10/14/22 1237 10/15/22 0635  WBC 16.2* 17.4* 15.6* 16.8*  NEUTROABS 12.2* 13.9* 12.9*  --   HGB 9.3* 8.9* 9.1* 8.2*  HCT 27.8* 27.4* 28.9* 26.2*  MCV 89.4 91.0 92.6 93.9  PLT 480* 508* 469* 490*   Basic Metabolic Panel: Recent Labs  Lab 10/12/22 1307 10/14/22 1008 10/14/22 1237 10/15/22 0635  NA 127* 129* 129* 131*  K 3.8 4.2 4.2 3.9  CL 93* 93* 93* 98  CO2 27 29 28 28   GLUCOSE 111* 117* 97 90  BUN 21 22 20 14   CREATININE 1.13 1.17 1.16 1.02  CALCIUM 11.1* 11.6* 11.5* 11.3*  MG 1.8 2.0  --   --    Liver Function Tests: Recent Labs  Lab 10/12/22 1307 10/14/22 1008 10/14/22 1237  AST 37 39 34  ALT 44 40 42  ALKPHOS 248* 321* 307*  BILITOT 0.5 0.4 0.5  PROT 6.9 6.8 7.3  ALBUMIN 3.0* 2.9* 3.1*    Discharge time spent: greater than 30 minutes.  Signed: Enedina Finner, MD [facility] 10/17/2022

## 2022-10-17 NOTE — TOC Progression Note (Addendum)
Transition of Care [facility]) - Progression Note    Patient Details  Name: Jeffrey Fernandez MRN: [id number] Date of Birth: 23-Apr-1956  [facility] CM/SW Contact  Bing Quarry, RN Phone Number: 10/17/2022, 12:02 PM  Clinical Narrative:   10/17/22: DME home oxygen ordered, pending the saturation ambulation test note. [employer] notified to start order after RN CM spoke with patient. No home oxygen before and no agency preference. Patient wanted RN CM to speak to spouse, Victorino Dike, and her contact information given to [employer]. Barbie Kaula Klenke RN CM   1250 pm. Updated order in per RN ambulation saturation note. [employer] updated per [facility]. Gabriel Cirri RN CM        Expected Discharge Plan and Services                         DME Arranged: Oxygen DME Agency: [employer] Date DME Agency Contacted: 10/17/22 Time DME Agency Contacted: [phone] Representative spoke with at DME Agency: Leavy Cella             Social Determinants of Health (SDOH) Interventions SDOH Screenings   Food Insecurity: No Food Insecurity (09/07/2022)  Housing: Low Risk  (09/07/2022)  Transportation Needs: No Transportation Needs (09/07/2022)  Utilities: Not At Risk (09/07/2022)  Alcohol Screen: Low Risk  (12/02/2021)  Depression (PHQ2-9): Low Risk  (12/02/2021)  Tobacco Use: Medium Risk (10/14/2022)    Readmission Risk Interventions     No data to display

## 2022-10-17 NOTE — Discharge Planning (Addendum)
Patient ambulated in [place] on RA amd SPO2 fell to 75%. Patient placed on 4LNC while ambulating in hall and SPO2 was 94%.  At rest - Patient SPO2 was 85% on RA, yet with 2LNC (at rest) was 92 or better SPO2.  Since being Discharged home today.  Patient will need continuous O2 use (to be delivered) - for At rest and activity needs.  Cosigned S Samaiyah Howes MD

## 2022-10-19 ENCOUNTER — Telehealth: Payer: Self-pay

## 2022-10-19 ENCOUNTER — Telehealth: Payer: Self-pay | Admitting: *Deleted

## 2022-10-19 ENCOUNTER — Encounter: Payer: Self-pay | Admitting: Oncology

## 2022-10-19 ENCOUNTER — Other Ambulatory Visit: Payer: Self-pay | Admitting: Oncology

## 2022-10-19 DIAGNOSIS — C641 Malignant neoplasm of right kidney, except renal pelvis: Secondary | ICD-10-CM

## 2022-10-19 LAB — CULTURE, BLOOD (ROUTINE X 2)
Culture: NO GROWTH
Culture: NO GROWTH
Special Requests: ADEQUATE
Special Requests: ADEQUATE

## 2022-10-19 NOTE — Transitions of Care (Post Inpatient/ED Visit) (Signed)
   10/19/2022  Name: Jeffrey Fernandez MRN: [id number] DOB: [birth year]  Today's TOC FU Call Status: Today's TOC FU Call Status:: Successful TOC FU Call Competed TOC FU Call Complete Date: 10/19/22  Transition Care Management Follow-up Telephone Call Date of Discharge: 10/17/22 Discharge Facility: [facility]) Type of Discharge: Inpatient Admission Primary Inpatient Discharge Diagnosis:: chest pain How have you been since you were released from the hospital?: Same Any questions or concerns?: No  Items Reviewed: Did you receive and understand the discharge instructions provided?: Yes Medications obtained and verified?: Yes (Medications Reviewed) Any new allergies since your discharge?: No Dietary orders reviewed?: Yes Do you have support at home?: Yes People in Home: spouse  Home Care and Equipment/Supplies: Were Home Health Services Ordered?: NA Any new equipment or medical supplies ordered?: Yes Name of Medical supply agency?: [employer]  Functional Questionnaire: Do you need assistance with bathing/showering or dressing?: Yes Do you need assistance with meal preparation?: No Do you need assistance with eating?: No Do you have difficulty maintaining continence: No Do you need assistance with getting out of bed/getting out of a chair/moving?: Yes Do you have difficulty managing or taking your medications?: No  Follow up appointments reviewed: PCP Follow-up appointment confirmed?: No (declined, has an appt 10/26/22) MD Provider Line Number:[phone] Given: No Specialist Hospital Follow-up appointment confirmed?: Yes Date of Specialist follow-up appointment?: 10/22/22 Follow-Up Specialty Provider:: Dr Marcell Anger Do you need transportation to your follow-up appointment?: No Do you understand care options if your condition(s) worsen?: Yes-patient verbalized understanding    SIGNATURE Karena Addison, LPN [facility] Nurse Health Advisor Direct Dial  [phone]

## 2022-10-19 NOTE — Telephone Encounter (Signed)
Wife called stating patient was discharged Saturday from hospital ad that he has an appointment Thursday for treatment. Wife is asking about a treatment change and what the plan is do they keep appt Thursday or what. P[lease return her call

## 2022-10-19 NOTE — Progress Notes (Signed)
DISCONTINUE ON PATHWAY REGIMEN - Bladder     A cycle is every 21 days:     Gemcitabine      Cisplatin   **Always confirm dose/schedule in your pharmacy ordering system**  REASON: Disease Progression PRIOR TREATMENT: [id number]: Gemcitabine 1,000 mg/m2 D1, 8 + Cisplatin 70 mg/m2 D1 q21 Days for a Maximum of 6 Cycles TREATMENT RESPONSE: Progressive Disease (PD)  START ON PATHWAY REGIMEN - Bladder     A cycle is every 21 days:     Pembrolizumab   **Always confirm dose/schedule in your pharmacy ordering system**  Patient Characteristics: Advanced/Metastatic Disease, Second Line, FGFR3 Alteration Absent or Unknown, Prior Platinum-Based Therapy and No Prior PD-1/PD-L1 Inhibitor Therapeutic Status: Advanced/Metastatic Disease Line of Therapy: Second Line FGFR3 Alteration Status: Awaiting Test Results Intent of Therapy: Non-Curative / Palliative Intent, Discussed with Patient

## 2022-10-20 ENCOUNTER — Encounter: Payer: Self-pay | Admitting: Oncology

## 2022-10-20 NOTE — Telephone Encounter (Addendum)
Call returned to Mrs Kasim to inform of treatment change to [facility]. Questions answered regarding time frame for infusion. She then reported that patient is having constipation, his last bowel movement was 5 days ago. She gave him a suppository and he passed "3 little milk dud sized balls:"  I asked what he is taking for constipation and she said that he takes 100 mg Colace twice a day. I advised that she add Miralax daily to the colace and increase his colace to 2 caps in AM and 1 in PM as she did not want to give 200 mg twice a day. She will let doctor know Thursday if he has not had bowel movement and discuss lactulose prescription for patient. He did not like the liquid medicine given in the hospital for constipation. She is considering giving him a fleet enema. I cautioned about invasive measures while getting treatment and reasoning not to do them. She voiced understanding. She also mentioned that his insurance NCM mentioned using medical mushrooms. I told her I was not familiar with this and that she can discuss it further with doctor at his visit Thursday. She agreed

## 2022-10-22 ENCOUNTER — Inpatient Hospital Stay: Payer: BLUE CROSS/BLUE SHIELD

## 2022-10-22 ENCOUNTER — Inpatient Hospital Stay (HOSPITAL_BASED_OUTPATIENT_CLINIC_OR_DEPARTMENT_OTHER): Payer: BLUE CROSS/BLUE SHIELD | Admitting: Oncology

## 2022-10-22 ENCOUNTER — Ambulatory Visit: Payer: BLUE CROSS/BLUE SHIELD

## 2022-10-22 ENCOUNTER — Encounter: Payer: Self-pay | Admitting: Oncology

## 2022-10-22 ENCOUNTER — Inpatient Hospital Stay: Payer: BLUE CROSS/BLUE SHIELD | Admitting: Oncology

## 2022-10-22 DIAGNOSIS — C641 Malignant neoplasm of right kidney, except renal pelvis: Secondary | ICD-10-CM

## 2022-10-22 DIAGNOSIS — Z5112 Encounter for antineoplastic immunotherapy: Secondary | ICD-10-CM | POA: Diagnosis not present

## 2022-10-22 DIAGNOSIS — Z95828 Presence of other vascular implants and grafts: Secondary | ICD-10-CM

## 2022-10-22 DIAGNOSIS — C642 Malignant neoplasm of left kidney, except renal pelvis: Secondary | ICD-10-CM

## 2022-10-22 LAB — CMP (CANCER CENTER ONLY)
ALT: 36 U/L (ref 0–44)
AST: 42 U/L — ABNORMAL HIGH (ref 15–41)
Albumin: 2.5 g/dL — ABNORMAL LOW (ref 3.5–5.0)
Alkaline Phosphatase: 359 U/L — ABNORMAL HIGH (ref 38–126)
Anion gap: 7 (ref 5–15)
BUN: 15 mg/dL (ref 8–23)
CO2: 26 mmol/L (ref 22–32)
Calcium: 9.2 mg/dL (ref 8.9–10.3)
Chloride: 95 mmol/L — ABNORMAL LOW (ref 98–111)
Creatinine: 1.03 mg/dL (ref 0.61–1.24)
GFR, Estimated: >60 mL/min (ref >60)
Glucose, Bld: 161 mg/dL — ABNORMAL HIGH (ref 70–99)
Potassium: 4.2 mmol/L (ref 3.5–5.1)
Sodium: 128 mmol/L — ABNORMAL LOW (ref 135–145)
Total Bilirubin: 0.6 mg/dL (ref 0.3–1.2)
Total Protein: 6.8 g/dL (ref 6.5–8.1)

## 2022-10-22 LAB — CBC WITH DIFFERENTIAL (CANCER CENTER ONLY)
Abs Immature Granulocytes: 0.33 10*3/uL — ABNORMAL HIGH (ref 0.00–0.07)
Basophils Absolute: 0.1 10*3/uL (ref 0.0–0.1)
Basophils Relative: 1 %
Eosinophils Absolute: 0.2 10*3/uL (ref 0.0–0.5)
Eosinophils Relative: 1 %
HCT: 24.8 % — ABNORMAL LOW (ref 39.0–52.0)
Hemoglobin: 8 g/dL — ABNORMAL LOW (ref 13.0–17.0)
Immature Granulocytes: 2 %
Lymphocytes Relative: 5 %
Lymphs Abs: 0.9 10*3/uL (ref 0.7–4.0)
MCH: 29.4 pg (ref 26.0–34.0)
MCHC: 32.3 g/dL (ref 30.0–36.0)
MCV: 91.2 fL (ref 80.0–100.0)
Monocytes Absolute: 1.7 10*3/uL — ABNORMAL HIGH (ref 0.1–1.0)
Monocytes Relative: 10 %
Neutro Abs: 14.1 10*3/uL — ABNORMAL HIGH (ref 1.7–7.7)
Neutrophils Relative %: 81 %
Platelet Count: 468 10*3/uL — ABNORMAL HIGH (ref 150–400)
RBC: 2.72 MIL/uL — ABNORMAL LOW (ref 4.22–5.81)
RDW: 15.6 % — ABNORMAL HIGH (ref 11.5–15.5)
WBC Count: 17.4 10*3/uL — ABNORMAL HIGH (ref 4.0–10.5)
nRBC: 0 % (ref 0.0–0.2)

## 2022-10-22 LAB — PREPARE RBC (CROSSMATCH)

## 2022-10-22 LAB — TSH: TSH: 1.062 u[IU]/mL (ref 0.350–4.500)

## 2022-10-22 LAB — BPAM RBC: Blood Product Expiration Date: 202405042359

## 2022-10-22 LAB — TYPE AND SCREEN: ABO/RH(D): A POS

## 2022-10-22 LAB — ABO/RH: ABO/RH(D): A POS

## 2022-10-22 MED ORDER — SODIUM CHLORIDE 0.9% FLUSH
10.0000 mL | Freq: Once | INTRAVENOUS | Status: AC
  Filled 2022-10-22: qty 10

## 2022-10-22 MED ORDER — SODIUM CHLORIDE 0.9 % IV SOLN
Freq: Once | INTRAVENOUS | Status: AC
  Filled 2022-10-22: qty 250

## 2022-10-22 MED ORDER — HEPARIN SOD (PORK) LOCK FLUSH 100 UNIT/ML IV SOLN
500.0000 [IU] | Freq: Once | INTRAVENOUS | Status: AC
  Administered 2022-10-22: 500 [IU] via INTRAVENOUS
  Filled 2022-10-22: qty 5

## 2022-10-22 NOTE — Progress Notes (Signed)
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  INTERVAL HISTORY: Patient returns to clinic today for hospital follow-up and initiation of second line treatment with single agent Keytruda.  He continues to have significant weakness and fatigue and a decreased performance status.  He continues to have a poor appetite.  He continues to have pain, but states this is better controlled.  He has no neurologic complaints.  He denies any recent fevers or illnesses.  He has no chest pain, shortness of breath, cough, or hemoptysis.  He denies any nausea, vomiting, or constipation.  He has no urinary complaints.  Patient feels generally terrible, but offers no further specific complaints today.    REVIEW OF SYSTEMS:   Review of Systems  Constitutional:  Positive for malaise/fatigue and weight loss. Negative for fever.  Respiratory: Negative.  Negative for cough, hemoptysis and shortness of breath.   Cardiovascular:  Negative for chest pain and leg swelling.  Gastrointestinal: Negative.  Negative for abdominal pain, diarrhea and nausea.  Genitourinary: Negative.  Negative for flank pain and hematuria.  Musculoskeletal:  Negative for back pain.  Skin: Negative.  Negative for rash.  Neurological:  Positive for weakness. Negative for dizziness, focal weakness and headaches.  Psychiatric/Behavioral: Negative.  The patient is not nervous/anxious.     As per HPI. Otherwise, a complete review of systems is negative.  PAST MEDICAL HISTORY: Past Medical History:  Diagnosis Date   Cancer 2013   bladder   Diverticulitis    History of  chickenpox    History of measles    History of mumps    Hyperlipidemia    Hypertension     PAST SURGICAL HISTORY: Past Surgical History:  Procedure Laterality Date   HERNIA REPAIR  2002,2003   IR IMAGING GUIDED PORT INSERTION  08/25/2022   IVC VENOGRAPHY N/A 09/11/2022   Procedure: IVC Venography;  Surgeon: Renford Dills, MD;  Location: [facility] INVASIVE CV LAB;  Service: Cardiovascular;  Laterality: N/A;   PERIPHERAL VASCULAR THROMBECTOMY Right 09/08/2022   Procedure: PERIPHERAL VASCULAR THROMBECTOMY;  Surgeon: Renford Dills, MD;  Location: [facility] INVASIVE CV LAB;  Service: Cardiovascular;  Laterality: Right;   SIGMOIDOSCOPY  1998   [facility]   TRANSURETHRAL RESECTION OF BLADDER TUMOR  05/11/2012   Dr. Joseph Art, [facility]   VASECTOMY  2002   Bilateral inguinal herniorrhaphies    FAMILY HISTORY: Family History  Problem Relation Age of Onset   COPD Mother    Diverticulosis Mother    Diverticulosis Father    Diabetes Father    Hypertension Father    Diverticulosis Sister    Stroke Paternal Grandmother     ADVANCED DIRECTIVES (Y/N):  N  HEALTH MAINTENANCE: Social History   Tobacco Use   Smoking status: Former    Packs/day: 2.00    Years: 39.00    Additional pack years: 0.00    Total pack years: 78.00    Types: Cigarettes    Quit date: 07/14/2011    Years since quitting: 11.2    Passive exposure: Past   Smokeless tobacco: Never   Tobacco comments:    smoked 1 to 1 1/2  ppd for 35 years  Vaping Use   Vaping Use: Never used  Substance Use Topics   Alcohol use: Yes    Comment: drinks 2 beers daily: none since 1 week ago   Drug use: No     Colonoscopy:  PAP:  Bone density:  Lipid panel:  Allergies  Allergen Reactions   Amoxicillin Itching   Atenolol Diarrhea    Other reaction(s): HIVES   Other Nausea And Vomiting    Navy Beans   Oxycodone     Other reaction(s): HIVES   Shellfish Allergy Itching and Swelling    Current Outpatient  Medications  Medication Sig Dispense Refill   apixaban (ELIQUIS) 5 MG TABS tablet Take 1 tablet (5 mg total) by mouth 2 (two) times daily. 360 tablet 0   docusate sodium (COLACE) 250 MG capsule Take 250 mg by mouth daily.     fentaNYL (DURAGESIC) 50 MCG/HR Place 1 patch onto the skin every 3 (three) days as needed. 5 patch 0   HYDROcodone-acetaminophen (NORCO) 5-325 MG tablet Take 1-2 tablets by mouth every 4 (four) hours as needed for moderate pain. 30 tablet 0   metoCLOPramide (REGLAN) 10 MG tablet Take 1 tablet (10 mg total) by mouth 4 (four) times daily. 120 tablet 1   metoprolol succinate (TOPROL-XL) 25 MG 24 hr tablet TAKE 1 TABLET (25 MG TOTAL) BY MOUTH DAILY. 90 tablet 2   ondansetron (ZOFRAN-ODT) 8 MG disintegrating tablet Take 8 mg by mouth 2 (two) times daily.     pantoprazole (PROTONIX) 40 MG tablet TAKE 1 TABLET BY MOUTH EVERY DAY 90 tablet 2   tamsulosin (FLOMAX) 0.4 MG CAPS capsule Take 1 capsule (0.4 mg total) by mouth daily. 30 capsule 11   acetaminophen (TYLENOL) 500 MG tablet Take 500 mg by mouth every 6 (six) hours as needed. (Patient not taking: Reported on 10/22/2022)     traZODone (DESYREL) 50 MG tablet Take 0.5 tablets (25 mg total) by mouth at bedtime as needed for sleep. (Patient not taking: Reported on 10/22/2022) 30 tablet 0   No current facility-administered medications for this visit.   Facility-Administered Medications Ordered in Other Visits  Medication Dose Route Frequency Provider Last Rate Last Admin   heparin lock flush 100 unit/mL  500 Units Intravenous Once Alinda Dooms, NP       [COMPLETED] heparin lock flush 100 unit/mL  500 Units Intravenous Once Jeralyn Ruths, MD   500 Units at 10/22/22 1115   sodium chloride flush (NS) 0.9 % injection 10 mL  10 mL Intravenous Once Jeralyn Ruths, MD        OBJECTIVE: Vitals:   10/22/22 0924  BP: 108/73  Pulse: (!) 109  Resp: 18  Temp: 98.2 F (36.8 C)  SpO2: 99%     Body mass index is 20.82 kg/m.     ECOG FS:2 - Symptomatic, <50% confined to bed  General: Well-developed, well-nourished, no acute distress. Eyes: Pink conjunctiva, anicteric sclera. HEENT: Normocephalic, moist mucous membranes. Lungs: No audible wheezing or coughing. Heart: Regular rate and rhythm. Abdomen: Soft, nontender, no obvious distention. Musculoskeletal: No edema, cyanosis, or clubbing. Neuro: Alert, answering all questions appropriately. Cranial nerves grossly intact. Skin: No rashes or petechiae noted. Psych: Normal affect.   LAB RESULTS:  Lab Results  Component Value Date   NA 128 (L) 10/22/2022   K 4.2 10/22/2022   CL 95 (L) 10/22/2022   CO2 26 10/22/2022   GLUCOSE 161 (H) 10/22/2022   BUN 15 10/22/2022   CREATININE 1.03 10/22/2022  CALCIUM 9.2 10/22/2022   PROT 6.8 10/22/2022   ALBUMIN 2.5 (L) 10/22/2022   AST 42 (H) 10/22/2022   ALT 36 10/22/2022   ALKPHOS 359 (H) 10/22/2022   BILITOT 0.6 10/22/2022   GFRNONAA >60 10/22/2022   GFRAA 90 06/10/2020    Lab Results  Component Value Date   WBC 17.4 (H) 10/22/2022   NEUTROABS 14.1 (H) 10/22/2022   HGB 8.0 (L) 10/22/2022   HCT 24.8 (L) 10/22/2022   MCV 91.2 10/22/2022   PLT 468 (H) 10/22/2022     STUDIES: CT ABDOMEN PELVIS WO CONTRAST  Result Date: 10/14/2022 CLINICAL DATA:  Metastatic urothelial cancer. Abdominal pain. * Tracking Code: BO * EXAM: CT ABDOMEN AND PELVIS WITHOUT CONTRAST TECHNIQUE: Multidetector CT imaging of the abdomen and pelvis was performed following the standard protocol without IV contrast. RADIATION DOSE REDUCTION: This exam was performed according to the departmental dose-optimization program which includes automated exposure control, adjustment of the mA and/or kV according to patient size and/or use of iterative reconstruction technique. COMPARISON:  CT abdomen and pelvis 09/07/2022. Please see separate dictation of chest CT as well from earlier 10/14/2022 FINDINGS: Lower chest: Numerous pulmonary nodules again  identified. These increased from previous. Again please correlate with chest CT scan. Hepatobiliary: On this non IV contrast exam, there is a heterogeneous mass centrally in the right hepatic lobe which is larger today. On series 2, image 19 this measures 4.9 by 4.1 cm and previously measured only 2.2 x 1.6 cm. Other smaller lesions are identified as well in the liver. Less well defined without the advantage of IV contrast. The gallbladder is nondilated. Pancreas: Unremarkable. No pancreatic ductal dilatation or surrounding inflammatory changes. Spleen: Normal in size without focal abnormality. Adrenals/Urinary Tract: Left adrenal nodule again seen. Previously 17 x 15 mm and today 17 x 13 mm, similar. Right adrenal nodule today measures 2.4 by 1.9 cm on series 2, image 31 and previously 15 x 11 mm. There is contrast in the renal collecting system from the prior CT scan of the chest. There is a large infiltrative mass involving the posterior aspect of the right kidney with several adjacent retroperitoneal perinephric space nodules. The renal mass on the prior measured 6.5 x 4.4 cm and today is estimated in diameter on series 2, image 39 of 7.2 x 5.2 cm. The adjacent nodules are increasing as well. Example more caudal on series 2, image 57 measures 16 x 12 mm today and previously 16 x 9 mm. There is confluent abnormal soft tissue extending into the retroperitoneum as well. Preserved contours of the urinary bladder. Left-sided exophytic Bosniak 1 renal cyst laterally. Stomach/Bowel: Large bowel has a normal course and caliber with scattered stool. Few left-sided colonic diverticula. Normal appendix. Stomach and small bowel are nondilated. Vascular/Lymphatic: Normal caliber aorta with scattered vascular calcifications. Branch vessel calcifications are seen as well. There has been placement of the stent extending from the infrarenal IVC down into the common iliac veins. Patency of the stent is limited without the  advantage of contrast. Once again there is abnormal soft tissue in the nodes seen along the retroperitoneum. The area retrocaval which on the prior measured 4.7 x 2.7 cm, today on series 2, image 41 measures 4.7 by 4.1 cm. There is further extension into the adjacent vertebral body as well as seen on series 2, image 42 of the axial data set and on sagittal image 59 of series 6. Several other abnormal nodes throughout the retroperitoneum are also seen. Example at  the bifurcation of the IVC measures today 3.3 x 2.4 cm on series 2, image 52 and previously 3.2 x 2.2 cm. Reproductive: Enlarged prostate with mass effect along the base of the bladder. Other: Mild anasarca. Musculoskeletal: Curvature and degenerative changes of the spine. Trace anterolisthesis of L5 on S1 with pars defects. Multilevel stenosis. Again there is bony erosion by adjacent abnormal lymph nodes at the L2 level. IMPRESSION: Overall progression of known neoplasm. Increasing liver metastases. Right-sided renal masses slight larger in the adjacent perinephric space and retroperitoneum nodules are increasing. Abnormal retroperitoneal lymph node enlargement again identified some areas are similar but others are increasing. In addition there is further extension of soft tissue mass extending into the L2 vertebral body. No bowel obstruction or free air.  Normal appendix. Interval placement of the stent from the IVC into the common iliac veins. Evaluation of patency is limited without IV contrast. Increasing right-sided adrenal nodule.  Stable left. Increasing lung metastases as seen on separate CT scan of the chest Electronically Signed   By: Karen Kays M.D.   On: 10/14/2022 13:59   DG Chest Port 1 View  Result Date: 10/14/2022 CLINICAL DATA:  Sepsis.  History of metastatic urothelial cancer EXAM: PORTABLE CHEST 1 VIEW COMPARISON:  Same day chest CT FINDINGS: Right-sided chest port in place with distal tip at the superior cavoatrial junction. Heart  size is normal. Innumerable pulmonary nodules throughout both lungs compatible with known pulmonary metastatic disease. Trace right pleural effusion. No pneumothorax. IMPRESSION: 1. Innumerable pulmonary nodules throughout both lungs compatible with known pulmonary metastatic disease. 2. Trace right pleural effusion. Electronically Signed   By: Duanne Guess D.O.   On: 10/14/2022 13:30   CT Angio Chest PE W/Cm &/Or Wo Cm  Result Date: 10/14/2022 CLINICAL DATA:  Chest pain. History of metastatic urothelial cancer. EXAM: CT ANGIOGRAPHY CHEST WITH CONTRAST TECHNIQUE: Multidetector CT imaging of the chest was performed using the standard protocol during bolus administration of intravenous contrast. Multiplanar CT image reconstructions and MIPs were obtained to evaluate the vascular anatomy. RADIATION DOSE REDUCTION: This exam was performed according to the departmental dose-optimization program which includes automated exposure control, adjustment of the mA and/or kV according to patient size and/or use of iterative reconstruction technique. CONTRAST:  50mL OMNIPAQUE IOHEXOL 350 MG/ML SOLN COMPARISON:  CT chest dated September 07, 2022. FINDINGS: Cardiovascular: Satisfactory opacification of the pulmonary arteries to the segmental level. No evidence of pulmonary embolism. Normal heart size. No pericardial effusion. No thoracic aortic aneurysm or dissection. Mediastinum/Nodes: Multiple mediastinal and bilateral hilar lymph nodes are stable to slightly increased in size. For example, a subcarinal lymph node currently measures 1.1 cm in short axis, previously 7 mm. No enlarged axillary lymph nodes. Thyroid gland, trachea, and esophagus demonstrate no significant findings. Lungs/Pleura: Marked interval increase in size and number of widespread pulmonary metastases. Previously noted cavitary lesion in the posterior right lower lobe has resolved. Increased trace right pleural effusion. No pneumothorax. Upper Abdomen:  Large infiltrative mass involving the posterior right kidney is not significantly changed. A similar conglomerate retrocaval lymphadenopathy and perinephric retroperitoneal nodularity. Stable left and enlarging right adrenal metastases. Interval IVC stenting. Musculoskeletal: No chest wall abnormality. No acute or significant osseous findings. Review of the MIP images confirms the above findings. IMPRESSION: 1. No evidence of pulmonary embolism. 2. Progressive widespread pulmonary metastatic disease. 3. Increased trace right pleural effusion. 4. Similar large infiltrative mass involving the posterior right kidney. Similar conglomerate retrocaval lymphadenopathy and perinephric retroperitoneal nodularity. 5. Stable  left and enlarging right adrenal metastases. Electronically Signed   By: Obie Dredge M.D.   On: 10/14/2022 13:11   DG Chest 2 View  Result Date: 10/05/2022 CLINICAL DATA:  Cough- urothelial cancer metastatic to lung AND hx of PE. New cough x 1 week; eval for pneumonia EXAM: CHEST - 2 VIEW COMPARISON:  Radiograph 09/07/2022 FINDINGS: Chest port catheter tip overlies the distal SVC. Unchanged cardiomediastinal silhouette. There are diffuse nodular opacities in the lungs compatible with known metastatic disease, appears more prominent in size and number in comparison to recent radiograph. No large effusion or evidence of pneumothorax. No acute osseous abnormality. IMPRESSION: Diffuse nodular opacities consistent with known pulmonary metastatic disease, appears more prominent in size and number in comparison to recent chest radiograph in February, could reflect progression of disease or a superimposed infectious/inflammatory process. Electronically Signed   By: Caprice Renshaw M.D.   On: 10/05/2022 11:44    ONCOLOGY HISTORY: CT scan results from July 31, 2022 reviewed independently with a 6.0 x 4.6 x 7.3 hypoenhancing infiltrative mass of the right kidney highly suspicious for underlying malignancy.   This lesion was not evident on CT scan from June 19, 2020.  PET scan results from August 14, 2022 reviewed independently with hypermetabolic kidney mass, retroperitoneal lymphadenopathy and multiple bilateral pulmonary nodules consistent with metastatic disease.  Biopsy confirmed diagnosis.  Repeat CT scan while patient was admitted to the hospital on September 07, 2022 appeared to reveal progression of disease, but unsure the clinical significance of this with patient only having 1 cycle of chemotherapy at that time.  Recent imaging revealed clear progression of disease.  Patient last received cisplatin and gemcitabine on October 01, 2022.     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  CT scan results from October 14, 2022 reviewed clear progression of disease.  Hospice was briefly discussed, but patient would like to attempt second line treatment despite his decreased performance status.  Plan is to give Keytruda every 3 weeks and reimage in approximately 2 to 3 months.  Plan was to initiate cycle 1 of Keytruda today, but drug was unavailable and patient will return to clinic next week for reconsideration of cycle 1.   Pain: Patient reports good days and bad days.  He does not want to switch his narcotic regimen at this time.  Continue fentanyl patch 50 mcg every 72 hours and Norco as needed. Nausea: Continue antiemetics as needed. Constipation: Continue OTC remedies. Hyponatremia: Chronic and unchanged.  Patient sodium is 128. Renal insufficiency: Resolved. Leukocytosis: Likely reactive, monitor.   Anemia: Hemoglobin is trended down to 8.0.  Return to clinic tomorrow for 1 unit packed red blood cells. Hypokalemia: Resolved.   DVT: Continue Eliquis as prescribed.  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  Follow-up with vascular surgery as scheduled. Poor  appetite/early satiety: Patient was given a referral to dietary as well as a prescription for Reglan. Decreased performance status: Patient will receive 1 L of IV fluids today.   Patient expressed understanding and was in agreement with this plan. He also understands that He can call clinic at any time with any questions, concerns, or complaints.    Cancer Staging  Urothelial carcinoma of kidney Staging form: Kidney, AJCC 8th Edition - Clinical stage from 08/19/2022: Stage IV (cT4, cN1, cM1) - Signed by Jeralyn Ruths, MD  on 08/19/2022   Jeralyn Ruths, MD   10/22/2022 11:13 AM

## 2022-10-22 NOTE — Progress Notes (Signed)
Having lower abdomen pain. Has a cough that comes and goes. Worried about appetite and hydration. Has not had a good BM since last Tuesday. Does go in small amounts often. Taking colace and miralax.

## 2022-10-23 ENCOUNTER — Other Ambulatory Visit: Payer: Self-pay | Admitting: Oncology

## 2022-10-23 ENCOUNTER — Other Ambulatory Visit: Payer: Self-pay

## 2022-10-23 ENCOUNTER — Other Ambulatory Visit: Payer: Self-pay | Admitting: Family Medicine

## 2022-10-23 ENCOUNTER — Inpatient Hospital Stay: Payer: BLUE CROSS/BLUE SHIELD

## 2022-10-23 DIAGNOSIS — K219 Gastro-esophageal reflux disease without esophagitis: Secondary | ICD-10-CM

## 2022-10-23 DIAGNOSIS — C641 Malignant neoplasm of right kidney, except renal pelvis: Secondary | ICD-10-CM

## 2022-10-23 DIAGNOSIS — R002 Palpitations: Secondary | ICD-10-CM

## 2022-10-23 DIAGNOSIS — I10 Essential (primary) hypertension: Secondary | ICD-10-CM

## 2022-10-23 DIAGNOSIS — Z5112 Encounter for antineoplastic immunotherapy: Secondary | ICD-10-CM | POA: Diagnosis not present

## 2022-10-23 LAB — TYPE AND SCREEN
Antibody Screen: NEGATIVE
Unit division: 0

## 2022-10-23 LAB — BPAM RBC
Blood Product Expiration Date: 202405042359
ISSUE DATE / TIME: 202404120909

## 2022-10-23 LAB — T4: T4, Total: 10.5 ug/dL (ref 4.5–12.0)

## 2022-10-23 MED ORDER — ACETAMINOPHEN 325 MG PO TABS
650.0000 mg | ORAL_TABLET | Freq: Once | ORAL | Status: AC
  Administered 2022-10-23: 650 mg via ORAL
  Filled 2022-10-23: qty 2

## 2022-10-23 MED ORDER — DIPHENHYDRAMINE HCL 50 MG/ML IJ SOLN
25.0000 mg | Freq: Once | INTRAMUSCULAR | Status: AC
  Administered 2022-10-23: 25 mg via INTRAVENOUS
  Filled 2022-10-23: qty 1

## 2022-10-23 MED ORDER — HEPARIN SOD (PORK) LOCK FLUSH 100 UNIT/ML IV SOLN
250.0000 [IU] | INTRAVENOUS | Status: DC | PRN
  Filled 2022-10-23: qty 5

## 2022-10-23 MED ORDER — SODIUM CHLORIDE 0.9% IV SOLUTION
250.0000 mL | Freq: Once | INTRAVENOUS | Status: AC
  Administered 2022-10-23: 250 mL via INTRAVENOUS
  Filled 2022-10-23: qty 250

## 2022-10-23 MED ORDER — HEPARIN SOD (PORK) LOCK FLUSH 100 UNIT/ML IV SOLN
500.0000 [IU] | Freq: Every day | INTRAVENOUS | Status: AC | PRN
  Administered 2022-10-23: 500 [IU]
  Filled 2022-10-23: qty 5

## 2022-10-23 NOTE — Telephone Encounter (Signed)
Requested Prescriptions  Pending Prescriptions Disp Refills   metoprolol succinate (TOPROL-XL) 25 MG 24 hr tablet [Pharmacy Med Name: METOPROLOL SUCC ER 25 MG TAB] 90 tablet 0    Sig: TAKE 1 TABLET (25 MG TOTAL) BY MOUTH DAILY.     Cardiovascular:  Beta Blockers Passed - 10/23/2022  2:30 AM      Passed - Last BP in normal range    BP Readings from Last 1 Encounters:  10/23/22 132/79         Passed - Last Heart Rate in normal range    Pulse Readings from Last 1 Encounters:  10/23/22 99         Passed - Valid encounter within last 6 months    Recent Outpatient Visits           1 month ago Essential (primary) hypertension   [place] [facility] Malva Limes, MD   10 months ago Essential (primary) hypertension   [facility] Malva Limes, MD   1 year ago Diverticulitis   [facility] Malva Limes, MD   2 years ago Hematuria, unspecified type   [place] [facility] Flinchum, Eula Fried, FNP   2 years ago Essential (primary) hypertension   [place] [facility] Sherrie Mustache, Demetrios Isaacs, MD       Future Appointments             In 2 months Richardo Hanks, Laurette Schimke, MD [facility] [place]             pantoprazole (PROTONIX) 40 MG tablet [Pharmacy Med Name: PANTOPRAZOLE SOD DR 40 MG TAB] 90 tablet 0    Sig: TAKE 1 TABLET BY MOUTH EVERY DAY     Gastroenterology: Proton Pump Inhibitors Passed - 10/23/2022  2:30 AM      Passed - Valid encounter within last 12 months    Recent Outpatient Visits           1 month ago Essential (primary) hypertension   [place] [facility] Malva Limes, MD   10 months ago Essential (primary) hypertension   [place] [facility] Malva Limes, MD   1 year ago Diverticulitis   [place] [facility] Malva Limes, MD   2 years ago Hematuria, unspecified type   [place]  [facility] Flinchum, Eula Fried, FNP   2 years ago Essential (primary) hypertension   [place] [facility] Sherrie Mustache, Demetrios Isaacs, MD       Future Appointments             In 2 months Richardo Hanks, Laurette Schimke, MD [facility] [place]             losartan-hydrochlorothiazide (HYZAAR) 100-12.5 MG tablet [Pharmacy Med Name: LOSARTAN-HCTZ 100-12.5 MG TAB] 90 tablet 0    Sig: TAKE 1 TABLET BY MOUTH EVERY DAY     Cardiovascular: ARB + Diuretic Combos Failed - 10/23/2022  2:30 AM      Failed - Na in normal range and within 180 days    Sodium  Date Value Ref Range Status  10/22/2022 128 (L) 135 - 145 mmol/L Final  12/02/2021 142 134 - 144 mmol/L Final         Passed - K in normal range and within 180 days    Potassium  Date Value Ref Range Status  10/22/2022 4.2 3.5 - 5.1 mmol/L Final  Passed - Cr in normal range and within 180 days    Creatinine  Date Value Ref Range Status  10/22/2022 1.03 0.61 - 1.24 mg/dL Final         Passed - eGFR is 10 or above and within 180 days    GFR calc Af Amer  Date Value Ref Range Status  06/10/2020 90 >59 mL/min/1.73 Final    Comment:    **In accordance with recommendations from the NKF-ASN Task force,**   [facility] is in the process of updating its eGFR calculation to the   2021 CKD-EPI creatinine equation that estimates kidney function   without a race variable.    GFR, Estimated  Date Value Ref Range Status  10/22/2022 >60 >60 mL/min Final    Comment:    (NOTE) Calculated using the CKD-EPI Creatinine Equation (2021)    eGFR  Date Value Ref Range Status  12/02/2021 70 >59 mL/min/1.73 Final         Passed - Patient is not pregnant      Passed - Last BP in normal range    BP Readings from Last 1 Encounters:  10/23/22 132/79         Passed - Valid encounter within last 6 months    Recent Outpatient Visits           1 month ago Essential (primary) hypertension   [place] [facility] Malva Limes, MD   10 months ago Essential (primary) hypertension   [place] [facility] Malva Limes, MD   1 year ago Diverticulitis   [facility] Malva Limes, MD   2 years ago Hematuria, unspecified type   [place] [facility] Flinchum, Eula Fried, FNP   2 years ago Essential (primary) hypertension   [place] Malva Limes, MD       Future Appointments             In 2 months Richardo Hanks, Laurette Schimke, MD [facility] [place]

## 2022-10-23 NOTE — Patient Instructions (Signed)
[place] [facility]  Discharge Instructions: Thank you for choosing [facility] to provide your oncology and hematology care.  If you have a lab appointment with the Cancer Center, please go directly to the Cancer Center and check in at the registration area.  Wear comfortable clothing and clothing appropriate for easy access to any Portacath or PICC line.   We strive to give you quality time with your provider. You may need to reschedule your appointment if you arrive late (15 or more minutes).  Arriving late affects you and other patients whose appointments are after yours.  Also, if you miss three or more appointments without notifying the office, you may be dismissed from the clinic at the provider's discretion.      For prescription refill requests, have your pharmacy contact our office and allow 72 hours for refills to be completed.    Today you received the following chemotherapy and/or immunotherapy agents blood transfusion.      To help prevent nausea and vomiting after your treatment, we encourage you to take your nausea medication as directed.  BELOW ARE SYMPTOMS THAT SHOULD BE REPORTED IMMEDIATELY: *FEVER GREATER THAN 100.4 F (38 C) OR HIGHER *CHILLS OR SWEATING *NAUSEA AND VOMITING THAT IS NOT CONTROLLED WITH YOUR NAUSEA MEDICATION *UNUSUAL SHORTNESS OF BREATH *UNUSUAL BRUISING OR BLEEDING *URINARY PROBLEMS (pain or burning when urinating, or frequent urination) *BOWEL PROBLEMS (unusual diarrhea, constipation, pain near the anus) TENDERNESS IN MOUTH AND THROAT WITH OR WITHOUT PRESENCE OF ULCERS (sore throat, sores in mouth, or a toothache) UNUSUAL RASH, SWELLING OR PAIN  UNUSUAL VAGINAL DISCHARGE OR ITCHING   Items with * indicate a potential emergency and should be followed up as soon as possible or go to the Emergency Department if any problems should occur.  Please show the CHEMOTHERAPY ALERT CARD or IMMUNOTHERAPY ALERT CARD at  check-in to the Emergency Department and triage nurse.  Should you have questions after your visit or need to cancel or reschedule your appointment, please contact [place] [facility]  [phone] and follow the prompts.  Office hours are 8:00 a.m. to 4:30 p.m. Monday - Friday. Please note that voicemails left after 4:00 p.m. may not be returned until the following business day.  We are closed weekends and major holidays. You have access to a nurse at all times for urgent questions. Please call the main number to the clinic [phone] and follow the prompts.  For any non-urgent questions, you may also contact your provider using MyChart. We now offer e-Visits for anyone 53 and older to request care online for non-urgent symptoms. For details visit [email or web address].   Also download the MyChart app! Go to the app store, search "MyChart", open the app, select [place], and log in with your MyChart username and password.

## 2022-10-24 LAB — TYPE AND SCREEN

## 2022-10-24 LAB — BPAM RBC

## 2022-10-26 ENCOUNTER — Encounter: Payer: Self-pay | Admitting: Oncology

## 2022-10-26 ENCOUNTER — Inpatient Hospital Stay (HOSPITAL_BASED_OUTPATIENT_CLINIC_OR_DEPARTMENT_OTHER): Payer: BLUE CROSS/BLUE SHIELD | Admitting: Oncology

## 2022-10-26 ENCOUNTER — Ambulatory Visit: Payer: BLUE CROSS/BLUE SHIELD | Admitting: Family Medicine

## 2022-10-26 ENCOUNTER — Inpatient Hospital Stay: Payer: BLUE CROSS/BLUE SHIELD

## 2022-10-26 VITALS — BP 102/83 | HR 129 | Temp 97.8°F | Resp 18 | Wt 142.1 lb

## 2022-10-26 DIAGNOSIS — G893 Neoplasm related pain (acute) (chronic): Secondary | ICD-10-CM | POA: Diagnosis not present

## 2022-10-26 DIAGNOSIS — C641 Malignant neoplasm of right kidney, except renal pelvis: Secondary | ICD-10-CM

## 2022-10-26 DIAGNOSIS — C642 Malignant neoplasm of left kidney, except renal pelvis: Secondary | ICD-10-CM

## 2022-10-26 DIAGNOSIS — Z5112 Encounter for antineoplastic immunotherapy: Secondary | ICD-10-CM | POA: Diagnosis not present

## 2022-10-26 LAB — CMP (CANCER CENTER ONLY)
ALT: 35 U/L (ref 0–44)
AST: 34 U/L (ref 15–41)
Albumin: 2.4 g/dL — ABNORMAL LOW (ref 3.5–5.0)
Alkaline Phosphatase: 329 U/L — ABNORMAL HIGH (ref 38–126)
Anion gap: 7 (ref 5–15)
BUN: 17 mg/dL (ref 8–23)
CO2: 25 mmol/L (ref 22–32)
Calcium: 9.6 mg/dL (ref 8.9–10.3)
Chloride: 96 mmol/L — ABNORMAL LOW (ref 98–111)
Creatinine: 0.9 mg/dL (ref 0.61–1.24)
GFR, Estimated: >60 mL/min (ref >60)
Glucose, Bld: 158 mg/dL — ABNORMAL HIGH (ref 70–99)
Potassium: 4.5 mmol/L (ref 3.5–5.1)
Sodium: 128 mmol/L — ABNORMAL LOW (ref 135–145)
Total Bilirubin: 0.9 mg/dL (ref 0.3–1.2)
Total Protein: 7 g/dL (ref 6.5–8.1)

## 2022-10-26 LAB — CBC WITH DIFFERENTIAL (CANCER CENTER ONLY)
Abs Immature Granulocytes: 0.34 10*3/uL — ABNORMAL HIGH (ref 0.00–0.07)
Basophils Absolute: 0.1 10*3/uL (ref 0.0–0.1)
Basophils Relative: 1 %
Eosinophils Absolute: 0.3 10*3/uL (ref 0.0–0.5)
Eosinophils Relative: 2 %
HCT: 29.3 % — ABNORMAL LOW (ref 39.0–52.0)
Hemoglobin: 9.3 g/dL — ABNORMAL LOW (ref 13.0–17.0)
Immature Granulocytes: 2 %
Lymphocytes Relative: 5 %
Lymphs Abs: 0.9 10*3/uL (ref 0.7–4.0)
MCH: 28.7 pg (ref 26.0–34.0)
MCHC: 31.7 g/dL (ref 30.0–36.0)
MCV: 90.4 fL (ref 80.0–100.0)
Monocytes Absolute: 2 10*3/uL — ABNORMAL HIGH (ref 0.1–1.0)
Monocytes Relative: 10 %
Neutro Abs: 15.8 10*3/uL — ABNORMAL HIGH (ref 1.7–7.7)
Neutrophils Relative %: 80 %
Platelet Count: 455 10*3/uL — ABNORMAL HIGH (ref 150–400)
RBC: 3.24 MIL/uL — ABNORMAL LOW (ref 4.22–5.81)
RDW: 15.6 % — ABNORMAL HIGH (ref 11.5–15.5)
WBC Count: 19.4 10*3/uL — ABNORMAL HIGH (ref 4.0–10.5)
nRBC: 0 % (ref 0.0–0.2)

## 2022-10-26 LAB — SAMPLE TO BLOOD BANK

## 2022-10-26 MED ORDER — SODIUM CHLORIDE 0.9 % IV SOLN
Freq: Once | INTRAVENOUS | Status: DC
  Filled 2022-10-26: qty 250

## 2022-10-26 MED ORDER — HEPARIN SOD (PORK) LOCK FLUSH 100 UNIT/ML IV SOLN
500.0000 [IU] | Freq: Once | INTRAVENOUS | Status: AC | PRN
  Administered 2022-10-26: 500 [IU]
  Filled 2022-10-26: qty 5

## 2022-10-26 MED ORDER — SODIUM CHLORIDE 0.9 % IV SOLN
200.0000 mg | Freq: Once | INTRAVENOUS | Status: AC
  Administered 2022-10-26: 200 mg via INTRAVENOUS
  Filled 2022-10-26: qty 8

## 2022-10-26 MED ORDER — MORPHINE SULFATE (PF) 2 MG/ML IV SOLN: 2.0000 mg | Freq: Once | INTRAVENOUS | Status: DC

## 2022-10-26 MED ORDER — MORPHINE SULFATE (PF) 2 MG/ML IV SOLN
2.0000 mg | Freq: Once | INTRAVENOUS | Status: AC
  Administered 2022-10-26: 2 mg via INTRAVENOUS
  Filled 2022-10-26: qty 1

## 2022-10-26 MED ORDER — SODIUM CHLORIDE 0.9 % IV SOLN
Freq: Once | INTRAVENOUS | Status: AC
  Filled 2022-10-26: qty 250

## 2022-10-26 NOTE — Progress Notes (Signed)
Pt here for follow up, He reports right side earache, and swelling and pain to right testicle. He also reports that for the past few days, his temp has been running 99 in the late evening, it only lasts about 1 hour.

## 2022-10-26 NOTE — Progress Notes (Signed)
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  INTERVAL HISTORY: Patient returns to clinic today for repeat laboratory work, further evaluation, and initiation of single agent Keytruda.  Patient has significant pain today, but states he has good days and bad days.  He continues to have chronic weakness and fatigue and a decreased performance status.  He has a poor appetite.  He has no neurologic complaints.  He denies any recent fevers or illnesses.  He has no chest pain, shortness of breath, cough, or hemoptysis.  He denies any nausea, vomiting, or constipation.  He has no urinary complaints.  Patient generally feels terrible, but offers no further specific complaints today.  REVIEW OF SYSTEMS:   Review of Systems  Constitutional:  Positive for malaise/fatigue and weight loss. Negative for fever.  Respiratory: Negative.  Negative for cough, hemoptysis and shortness of breath.   Cardiovascular:  Negative for chest pain and leg swelling.  Gastrointestinal: Negative.  Negative for abdominal pain, diarrhea and nausea.  Genitourinary: Negative.  Negative for flank pain and hematuria.  Musculoskeletal:  Negative for back pain.  Skin: Negative.  Negative for rash.  Neurological:  Positive for weakness. Negative for dizziness, focal weakness and headaches.  Psychiatric/Behavioral: Negative.  The patient is not nervous/anxious.     As per HPI. Otherwise, a complete review of systems is negative.  PAST MEDICAL HISTORY: Past Medical History:  Diagnosis Date   Cancer 2013   bladder   Diverticulitis    History of chickenpox     History of measles    History of mumps    Hyperlipidemia    Hypertension     PAST SURGICAL HISTORY: Past Surgical History:  Procedure Laterality Date   HERNIA REPAIR  2002,2003   IR IMAGING GUIDED PORT INSERTION  08/25/2022   IVC VENOGRAPHY N/A 09/11/2022   Procedure: IVC Venography;  Surgeon: Renford Dills, MD;  Location: [facility] INVASIVE CV LAB;  Service: Cardiovascular;  Laterality: N/A;   PERIPHERAL VASCULAR THROMBECTOMY Right 09/08/2022   Procedure: PERIPHERAL VASCULAR THROMBECTOMY;  Surgeon: Renford Dills, MD;  Location: [facility] INVASIVE CV LAB;  Service: Cardiovascular;  Laterality: Right;   SIGMOIDOSCOPY  1998    surgical associates   TRANSURETHRAL RESECTION OF BLADDER TUMOR  05/11/2012   Dr. Joseph Art, [facility]   VASECTOMY  2002   Bilateral inguinal herniorrhaphies    FAMILY HISTORY: Family History  Problem Relation Age of Onset   COPD Mother    Diverticulosis Mother    Diverticulosis Father    Diabetes Father    Hypertension Father    Diverticulosis Sister    Stroke Paternal Grandmother     ADVANCED DIRECTIVES (Y/N):  N  HEALTH MAINTENANCE: Social History   Tobacco Use   Smoking status: Former    Packs/day: 2.00    Years: 39.00    Additional pack years: 0.00    Total pack years: 78.00    Types: Cigarettes    Quit date: 07/14/2011    Years since quitting: 11.2    Passive exposure: Past   Smokeless tobacco: Never   Tobacco comments:    smoked 1 to 1 1/2  ppd for 35 years  Vaping Use  Vaping Use: Never used  Substance Use Topics   Alcohol use: Yes    Comment: drinks 2 beers daily: none since 1 week ago   Drug use: No     Colonoscopy:  PAP:  Bone density:  Lipid panel:  Allergies  Allergen Reactions   Amoxicillin Itching   Atenolol Diarrhea    Other reaction(s): HIVES   Other Nausea And Vomiting    [employer] Beans   Oxycodone     Other reaction(s): HIVES   Shellfish Allergy Itching and Swelling    Current Outpatient Medications  Medication  Sig Dispense Refill   acetaminophen (TYLENOL) 500 MG tablet Take 500 mg by mouth every 6 (six) hours as needed.     apixaban (ELIQUIS) 5 MG TABS tablet Take 1 tablet (5 mg total) by mouth 2 (two) times daily. 360 tablet 0   docusate sodium (COLACE) 250 MG capsule Take 250 mg by mouth daily.     fentaNYL (DURAGESIC) 50 MCG/HR Place 1 patch onto the skin every 3 (three) days as needed. 5 patch 0   HYDROcodone-acetaminophen (NORCO) 5-325 MG tablet Take 1-2 tablets by mouth every 4 (four) hours as needed for moderate pain. 30 tablet 0   ipratropium-albuterol (DUONEB) 0.5-2.5 (3) MG/3ML SOLN Take 3 mLs by nebulization every 4 (four) hours as needed.     metoCLOPramide (REGLAN) 10 MG tablet Take 1 tablet (10 mg total) by mouth 4 (four) times daily. 120 tablet 1   metoprolol succinate (TOPROL-XL) 25 MG 24 hr tablet TAKE 1 TABLET (25 MG TOTAL) BY MOUTH DAILY. 90 tablet 0   ondansetron (ZOFRAN-ODT) 8 MG disintegrating tablet Take 8 mg by mouth 2 (two) times daily.     pantoprazole (PROTONIX) 40 MG tablet TAKE 1 TABLET BY MOUTH EVERY DAY 90 tablet 0   tamsulosin (FLOMAX) 0.4 MG CAPS capsule Take 1 capsule (0.4 mg total) by mouth daily. 30 capsule 11   traZODone (DESYREL) 50 MG tablet Take 0.5 tablets (25 mg total) by mouth at bedtime as needed for sleep. 30 tablet 0   Current Facility-Administered Medications  Medication Dose Route Frequency Provider Last Rate Last Admin   0.9 %  sodium chloride infusion   Intravenous Once Jeralyn Ruths, MD       morphine (PF) 2 MG/ML injection 2 mg  2 mg Intravenous Once Jeralyn Ruths, MD       Facility-Administered Medications Ordered in Other Visits  Medication Dose Route Frequency Provider Last Rate Last Admin   heparin lock flush 100 unit/mL  500 Units Intravenous Once Alinda Dooms, NP       sodium chloride flush (NS) 0.9 % injection 10 mL  10 mL Intravenous Once Jeralyn Ruths, MD        OBJECTIVE: Vitals:   10/26/22 1015  BP: 102/83   Pulse: (!) 129  Resp: 18  Temp: 97.8 F (36.6 C)  SpO2: 100%     Body mass index is 20.98 kg/m.    ECOG FS:2 - Symptomatic, <50% confined to bed  General: Ill-appearing, no acute distress.  Sitting in a wheelchair. Eyes: Pink conjunctiva, anicteric sclera. HEENT: Normocephalic, moist mucous membranes. Lungs: No audible wheezing or coughing. Heart: Regular rate and rhythm. Abdomen: Soft, nontender, no obvious distention. Musculoskeletal: No edema, cyanosis, or clubbing. Neuro: Alert, answering all questions appropriately. Cranial nerves grossly intact. Skin: No rashes or petechiae noted. Psych: Normal affect.  LAB RESULTS:  Lab Results  Component Value Date   NA 128 (L) 10/26/2022  K 4.5 10/26/2022   CL 96 (L) 10/26/2022   CO2 25 10/26/2022   GLUCOSE 158 (H) 10/26/2022   BUN 17 10/26/2022   CREATININE 0.90 10/26/2022   CALCIUM 9.6 10/26/2022   PROT 7.0 10/26/2022   ALBUMIN 2.4 (L) 10/26/2022   AST 34 10/26/2022   ALT 35 10/26/2022   ALKPHOS 329 (H) 10/26/2022   BILITOT 0.9 10/26/2022   GFRNONAA >60 10/26/2022   GFRAA 90 06/10/2020    Lab Results  Component Value Date   WBC 19.4 (H) 10/26/2022   NEUTROABS 15.8 (H) 10/26/2022   HGB 9.3 (L) 10/26/2022   HCT 29.3 (L) 10/26/2022   MCV 90.4 10/26/2022   PLT 455 (H) 10/26/2022     STUDIES: CT ABDOMEN PELVIS WO CONTRAST  Result Date: 10/14/2022 CLINICAL DATA:  Metastatic urothelial cancer. Abdominal pain. * Tracking Code: BO * EXAM: CT ABDOMEN AND PELVIS WITHOUT CONTRAST TECHNIQUE: Multidetector CT imaging of the abdomen and pelvis was performed following the standard protocol without IV contrast. RADIATION DOSE REDUCTION: This exam was performed according to the departmental dose-optimization program which includes automated exposure control, adjustment of the mA and/or kV according to patient size and/or use of iterative reconstruction technique. COMPARISON:  CT abdomen and pelvis 09/07/2022. Please see separate  dictation of chest CT as well from earlier 10/14/2022 FINDINGS: Lower chest: Numerous pulmonary nodules again identified. These increased from previous. Again please correlate with chest CT scan. Hepatobiliary: On this non IV contrast exam, there is a heterogeneous mass centrally in the right hepatic lobe which is larger today. On series 2, image 19 this measures 4.9 by 4.1 cm and previously measured only 2.2 x 1.6 cm. Other smaller lesions are identified as well in the liver. Less well defined without the advantage of IV contrast. The gallbladder is nondilated. Pancreas: Unremarkable. No pancreatic ductal dilatation or surrounding inflammatory changes. Spleen: Normal in size without focal abnormality. Adrenals/Urinary Tract: Left adrenal nodule again seen. Previously 17 x 15 mm and today 17 x 13 mm, similar. Right adrenal nodule today measures 2.4 by 1.9 cm on series 2, image 31 and previously 15 x 11 mm. There is contrast in the renal collecting system from the prior CT scan of the chest. There is a large infiltrative mass involving the posterior aspect of the right kidney with several adjacent retroperitoneal perinephric space nodules. The renal mass on the prior measured 6.5 x 4.4 cm and today is estimated in diameter on series 2, image 39 of 7.2 x 5.2 cm. The adjacent nodules are increasing as well. Example more caudal on series 2, image 57 measures 16 x 12 mm today and previously 16 x 9 mm. There is confluent abnormal soft tissue extending into the retroperitoneum as well. Preserved contours of the urinary bladder. Left-sided exophytic Bosniak 1 renal cyst laterally. Stomach/Bowel: Large bowel has a normal course and caliber with scattered stool. Few left-sided colonic diverticula. Normal appendix. Stomach and small bowel are nondilated. Vascular/Lymphatic: Normal caliber aorta with scattered vascular calcifications. Branch vessel calcifications are seen as well. There has been placement of the stent extending  from the infrarenal IVC down into the common iliac veins. Patency of the stent is limited without the advantage of contrast. Once again there is abnormal soft tissue in the nodes seen along the retroperitoneum. The area retrocaval which on the prior measured 4.7 x 2.7 cm, today on series 2, image 41 measures 4.7 by 4.1 cm. There is further extension into the adjacent vertebral body as well as  seen on series 2, image 42 of the axial data set and on sagittal image 59 of series 6. Several other abnormal nodes throughout the retroperitoneum are also seen. Example at the bifurcation of the IVC measures today 3.3 x 2.4 cm on series 2, image 52 and previously 3.2 x 2.2 cm. Reproductive: Enlarged prostate with mass effect along the base of the bladder. Other: Mild anasarca. Musculoskeletal: Curvature and degenerative changes of the spine. Trace anterolisthesis of L5 on S1 with pars defects. Multilevel stenosis. Again there is bony erosion by adjacent abnormal lymph nodes at the L2 level. IMPRESSION: Overall progression of known neoplasm. Increasing liver metastases. Right-sided renal masses slight larger in the adjacent perinephric space and retroperitoneum nodules are increasing. Abnormal retroperitoneal lymph node enlargement again identified some areas are similar but others are increasing. In addition there is further extension of soft tissue mass extending into the L2 vertebral body. No bowel obstruction or free air.  Normal appendix. Interval placement of the stent from the IVC into the common iliac veins. Evaluation of patency is limited without IV contrast. Increasing right-sided adrenal nodule.  Stable left. Increasing lung metastases as seen on separate CT scan of the chest Electronically Signed   By: Karen Kays M.D.   On: 10/14/2022 13:59   DG Chest Port 1 View  Result Date: 10/14/2022 CLINICAL DATA:  Sepsis.  History of metastatic urothelial cancer EXAM: PORTABLE CHEST 1 VIEW COMPARISON:  Same day chest CT  FINDINGS: Right-sided chest port in place with distal tip at the superior cavoatrial junction. Heart size is normal. Innumerable pulmonary nodules throughout both lungs compatible with known pulmonary metastatic disease. Trace right pleural effusion. No pneumothorax. IMPRESSION: 1. Innumerable pulmonary nodules throughout both lungs compatible with known pulmonary metastatic disease. 2. Trace right pleural effusion. Electronically Signed   By: Duanne Guess D.O.   On: 10/14/2022 13:30   CT Angio Chest PE W/Cm &/Or Wo Cm  Result Date: 10/14/2022 CLINICAL DATA:  Chest pain. History of metastatic urothelial cancer. EXAM: CT ANGIOGRAPHY CHEST WITH CONTRAST TECHNIQUE: Multidetector CT imaging of the chest was performed using the standard protocol during bolus administration of intravenous contrast. Multiplanar CT image reconstructions and MIPs were obtained to evaluate the vascular anatomy. RADIATION DOSE REDUCTION: This exam was performed according to the departmental dose-optimization program which includes automated exposure control, adjustment of the mA and/or kV according to patient size and/or use of iterative reconstruction technique. CONTRAST:  50mL OMNIPAQUE IOHEXOL 350 MG/ML SOLN COMPARISON:  CT chest dated September 07, 2022. FINDINGS: Cardiovascular: Satisfactory opacification of the pulmonary arteries to the segmental level. No evidence of pulmonary embolism. Normal heart size. No pericardial effusion. No thoracic aortic aneurysm or dissection. Mediastinum/Nodes: Multiple mediastinal and bilateral hilar lymph nodes are stable to slightly increased in size. For example, a subcarinal lymph node currently measures 1.1 cm in short axis, previously 7 mm. No enlarged axillary lymph nodes. Thyroid gland, trachea, and esophagus demonstrate no significant findings. Lungs/Pleura: Marked interval increase in size and number of widespread pulmonary metastases. Previously noted cavitary lesion in the posterior right  lower lobe has resolved. Increased trace right pleural effusion. No pneumothorax. Upper Abdomen: Large infiltrative mass involving the posterior right kidney is not significantly changed. A similar conglomerate retrocaval lymphadenopathy and perinephric retroperitoneal nodularity. Stable left and enlarging right adrenal metastases. Interval IVC stenting. Musculoskeletal: No chest wall abnormality. No acute or significant osseous findings. Review of the MIP images confirms the above findings. IMPRESSION: 1. No evidence of pulmonary embolism. 2.  Progressive widespread pulmonary metastatic disease. 3. Increased trace right pleural effusion. 4. Similar large infiltrative mass involving the posterior right kidney. Similar conglomerate retrocaval lymphadenopathy and perinephric retroperitoneal nodularity. 5. Stable left and enlarging right adrenal metastases. Electronically Signed   By: Obie Dredge M.D.   On: 10/14/2022 13:11   DG Chest 2 View  Result Date: 10/05/2022 CLINICAL DATA:  Cough- urothelial cancer metastatic to lung AND hx of PE. New cough x 1 week; eval for pneumonia EXAM: CHEST - 2 VIEW COMPARISON:  Radiograph 09/07/2022 FINDINGS: Chest port catheter tip overlies the distal SVC. Unchanged cardiomediastinal silhouette. There are diffuse nodular opacities in the lungs compatible with known metastatic disease, appears more prominent in size and number in comparison to recent radiograph. No large effusion or evidence of pneumothorax. No acute osseous abnormality. IMPRESSION: Diffuse nodular opacities consistent with known pulmonary metastatic disease, appears more prominent in size and number in comparison to recent chest radiograph in February, could reflect progression of disease or a superimposed infectious/inflammatory process. Electronically Signed   By: Caprice Renshaw M.D.   On: 10/05/2022 11:44    ONCOLOGY HISTORY: CT scan results from July 31, 2022 reviewed independently with a 6.0 x 4.6 x 7.3  hypoenhancing infiltrative mass of the right kidney highly suspicious for underlying malignancy.  This lesion was not evident on CT scan from June 19, 2020.  PET scan results from August 14, 2022 reviewed independently with hypermetabolic kidney mass, retroperitoneal lymphadenopathy and multiple bilateral pulmonary nodules consistent with metastatic disease.  Biopsy confirmed diagnosis.  Repeat CT scan while patient was admitted to the hospital on September 07, 2022 appeared to reveal progression of disease, but unsure the clinical significance of this with patient only having 1 cycle of chemotherapy at that time.  Recent imaging revealed clear progression of disease.  Patient last received cisplatin and gemcitabine on October 01, 2022.     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  CT scan results from October 14, 2022 reviewed clear progression of disease.  Hospice was briefly discussed, but patient would like to attempt second line treatment despite his decreased performance status.  Proceed with single agent Keytruda today.  Patient will also receive 1 L of IV fluids with every treatment.  Return to clinic in 1 week for further evaluation and then in 3 weeks for further evaluation and consideration of cycle 2.    Pain: Patient reports good days and bad days.  Today is a "bad day" and patient received 2 mg IV morphine during his infusion.  He does not want to switch his narcotic regimen at this time.  Continue fentanyl patch 50 mcg every 72 hours and Norco as needed. Nausea: Chronic and unchanged.  Continue antiemetics as needed. Constipation: Patient does not complain of this today.  Continue OTC remedies. Hyponatremia: Chronic and unchanged.  Patient's sodium level is 128 today. Leukocytosis: Likely reactive, monitor.   Anemia: Hemoglobin improved to 9.3 with 1 unit  packed red blood cells last week. DVT: Continue Eliquis as prescribed.  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  Follow-up with vascular surgery as scheduled. Poor appetite/early satiety: Patient was given a referral to dietary as well as a prescription for Reglan. Decreased performance status: Continue IV fluids with each treatment.  Patient also wishes to receive fluids every Monday and Thursday in between treatments.  Patient  expressed understanding and was in agreement with this plan. He also understands that He can call clinic at any time with any questions, concerns, or complaints.    Cancer Staging  Urothelial carcinoma of kidney Staging form: Kidney, AJCC 8th Edition - Clinical stage from 08/19/2022: Stage IV (cT4, cN1, cM1) - Signed by Jeralyn Ruths, MD on 08/19/2022   Jeralyn Ruths, MD   10/26/2022 3:38 PM

## 2022-10-26 NOTE — Patient Instructions (Signed)
[facility]  Discharge Instructions: Thank you for choosing [facility] to provide your oncology and hematology care.  If you have a lab appointment with the Cancer Center, please go directly to the Cancer Center and check in at the registration area.  Wear comfortable clothing and clothing appropriate for easy access to any Portacath or PICC line.   We strive to give you quality time with your provider. You may need to reschedule your appointment if you arrive late (15 or more minutes).  Arriving late affects you and other patients whose appointments are after yours.  Also, if you miss three or more appointments without notifying the office, you may be dismissed from the clinic at the provider's discretion.      For prescription refill requests, have your pharmacy contact our office and allow 72 hours for refills to be completed.    Today you received the following chemotherapy and/or immunotherapy agents- Keytruda      To help prevent nausea and vomiting after your treatment, we encourage you to take your nausea medication as directed.  BELOW ARE SYMPTOMS THAT SHOULD BE REPORTED IMMEDIATELY: *FEVER GREATER THAN 100.4 F (38 C) OR HIGHER *CHILLS OR SWEATING *NAUSEA AND VOMITING THAT IS NOT CONTROLLED WITH YOUR NAUSEA MEDICATION *UNUSUAL SHORTNESS OF BREATH *UNUSUAL BRUISING OR BLEEDING *URINARY PROBLEMS (pain or burning when urinating, or frequent urination) *BOWEL PROBLEMS (unusual diarrhea, constipation, pain near the anus) TENDERNESS IN MOUTH AND THROAT WITH OR WITHOUT PRESENCE OF ULCERS (sore throat, sores in mouth, or a toothache) UNUSUAL RASH, SWELLING OR PAIN  UNUSUAL VAGINAL DISCHARGE OR ITCHING   Items with * indicate a potential emergency and should be followed up as soon as possible or go to the Emergency Department if any problems should occur.  Please show the CHEMOTHERAPY ALERT CARD or IMMUNOTHERAPY ALERT CARD at check-in to  the Emergency Department and triage nurse.  Should you have questions after your visit or need to cancel or reschedule your appointment, please contact [facility]  [phone] and follow the prompts.  Office hours are 8:00 a.m. to 4:30 p.m. Monday - Friday. Please note that voicemails left after 4:00 p.m. may not be returned until the following business day.  We are closed weekends and major holidays. You have access to a nurse at all times for urgent questions. Please call the main number to the clinic [phone] and follow the prompts.  For any non-urgent questions, you may also contact your provider using MyChart. We now offer e-Visits for anyone 18 and older to request care online for non-urgent symptoms. For details visit [email or web address].   Also download the MyChart app! Go to the app store, search "MyChart", open the app, select [facility], and log in with your MyChart username and password.   

## 2022-10-27 ENCOUNTER — Telehealth: Payer: Self-pay | Admitting: *Deleted

## 2022-10-27 ENCOUNTER — Encounter: Payer: Self-pay | Admitting: *Deleted

## 2022-10-27 NOTE — Telephone Encounter (Signed)
Call returned to [facility] and informed of doctor response to be seen in [facility] with labs if bleeding persists. She will call back tomorrow and let us know if he needs to be seen.  She is also asking for something on letterhead to state that he is on oxygen so that she can give it to electric company in case the electric goes out they would be priority fixed

## 2022-10-27 NOTE — Telephone Encounter (Signed)
Patient wife called reporting that patient had first Keytruda yesterday and on his most recent voiding today, there was a lot of bright red blood mixed in his urine . He denies any new pain or other symptoms. He passed about 150 cc's in this voiding. Please advise

## 2022-10-28 ENCOUNTER — Telehealth: Payer: Self-pay | Admitting: *Deleted

## 2022-10-28 NOTE — Telephone Encounter (Signed)
Victorino Dike called back this morning requesting for the letter written yesterday to be amended to state the equipment  in use, his address be on it and his name and for it to be faxed to [email or web address] .   I attempted to edit it, but it would not let me

## 2022-10-29 ENCOUNTER — Encounter: Payer: Self-pay | Admitting: Hospice and Palliative Medicine

## 2022-10-29 ENCOUNTER — Inpatient Hospital Stay: Payer: BLUE CROSS/BLUE SHIELD

## 2022-10-29 ENCOUNTER — Other Ambulatory Visit: Payer: Self-pay

## 2022-10-29 ENCOUNTER — Encounter: Payer: Self-pay | Admitting: Oncology

## 2022-10-29 ENCOUNTER — Inpatient Hospital Stay (HOSPITAL_BASED_OUTPATIENT_CLINIC_OR_DEPARTMENT_OTHER): Payer: BLUE CROSS/BLUE SHIELD | Admitting: Hospice and Palliative Medicine

## 2022-10-29 VITALS — BP 130/84 | HR 103 | Temp 96.7°F | Resp 24

## 2022-10-29 VITALS — BP 99/77 | HR 116 | Temp 97.7°F | Resp 24

## 2022-10-29 DIAGNOSIS — C642 Malignant neoplasm of left kidney, except renal pelvis: Secondary | ICD-10-CM | POA: Diagnosis not present

## 2022-10-29 DIAGNOSIS — Z5112 Encounter for antineoplastic immunotherapy: Secondary | ICD-10-CM | POA: Diagnosis not present

## 2022-10-29 DIAGNOSIS — N50819 Testicular pain, unspecified: Secondary | ICD-10-CM

## 2022-10-29 MED ORDER — HEPARIN SOD (PORK) LOCK FLUSH 100 UNIT/ML IV SOLN
500.0000 [IU] | Freq: Once | INTRAVENOUS | Status: AC | PRN
  Administered 2022-10-29: 500 [IU]
  Filled 2022-10-29: qty 5

## 2022-10-29 MED ORDER — FENTANYL 50 MCG/HR TD PT72: 1.0000 | MEDICATED_PATCH | TRANSDERMAL | 0 refills | Status: DC | PRN

## 2022-10-29 MED ORDER — SODIUM CHLORIDE 0.9 % IV SOLN
Freq: Once | INTRAVENOUS | Status: AC
  Filled 2022-10-29: qty 250

## 2022-10-29 MED ORDER — MIRTAZAPINE 7.5 MG PO TABS: 7.5000 mg | ORAL_TABLET | Freq: Every day | ORAL | 2 refills | Status: DC

## 2022-10-29 MED ORDER — SODIUM CHLORIDE 0.9% FLUSH
10.0000 mL | Freq: Once | INTRAVENOUS | Status: DC | PRN
  Filled 2022-10-29: qty 10

## 2022-10-29 NOTE — Progress Notes (Signed)
Nutrition Follow-up:  Patient with stage IV urothelial carcinoma of kidney.  Noted progression and started on 2nd line treatment of [place].  Hospital admission 4/3-4/6 for chest pain and productive cough.    Met with patient and wife following fluids.  Wife reports that appetite is non existent. Eating a little bit of cereal and chocolate carnation instant breakfast.  Does not like ensure/boost shakes.  Patient having nausea and wife giving nausea medication.  Will see palliative care to address pain today. Having bowel movement.    Medications: reviewed  Labs: Na 128, glucose 158  Anthropometrics:   Weight 142 lb 1.6 oz 4/15 151 lb 9.6 oz on 3/21 165 lb on 3/7 175 lb on 2/22 171 lb on 1/31 181 lb on 1/24    NUTRITION DIAGNOSIS: Inadequate oral intake continues    INTERVENTION:  Consider trial of appetite stimulant.  Message sent to Palliative Care, NP Encouraged high calorie, high protein options Discussed strategies to help with taste change. Handout provided     MONITORING, EVALUATION, GOAL: weight trends, intake   NEXT VISIT: ~2-3 weeks while in clinic  Jeffrey Fernandez B. Freida Busman, RD, LDN Registered Dietitian [phone]

## 2022-10-29 NOTE — Progress Notes (Signed)
[facility] at [facility] [phone] [phone]  Patient Care Team: Malva Limes, MD as PCP - General (Family Medicine) Sondra Come, MD as Consulting Physician (Urology) Jeralyn Ruths, MD as Consulting Physician (Oncology)   NAME OF PATIENT: Jeffrey Fernandez  [id number]  08-23-1955   DATE OF VISIT: 10/29/22  REASON FOR CONSULT: Jeffrey Fernandez is a 67 y.o. 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  PET scan on 08/14/2022 shows hypermetabolic kidney mass with retroperitoneal lymphadenopathy and multiple bilateral pulmonary nodules.  INTERVAL HISTORY: CT scan from October 14, 2022 showed disease progression.  Hospice was briefly discussed given overall declining performance status the patient opted to pursue second line Keytruda  Patient was scheduled for IV fluids today but was an add-on to [facility] due to poorly controlled pain and poor oral intake.  Patient reports that he has had persistent pain in the abdomen and right testicle with some testicular swelling.  He denies trauma to that area.  No fever or chills.  No urinary symptoms.  Oral intake is minimal.  Denies any neurologic complaints. Denies recent fevers or illnesses. Denies any easy bleeding or bruising. Denies chest pain. Denies urinary complaints. Patient offers no further specific complaints today.  PAST MEDICAL HISTORY: Past Medical History:  Diagnosis Date   Cancer 2013   bladder   Diverticulitis    History of chickenpox    History of measles    History of mumps    Hyperlipidemia    Hypertension     PAST SURGICAL HISTORY:  Past Surgical History:  Procedure Laterality Date   HERNIA REPAIR  2002,2003   IR IMAGING GUIDED PORT INSERTION  08/25/2022   IVC VENOGRAPHY N/A 09/11/2022   Procedure: IVC Venography;  Surgeon: Renford Dills, MD;  Location: [facility] INVASIVE CV LAB;  Service:  Cardiovascular;  Laterality: N/A;   PERIPHERAL VASCULAR THROMBECTOMY Right 09/08/2022   Procedure: PERIPHERAL VASCULAR THROMBECTOMY;  Surgeon: Renford Dills, MD;  Location: [facility] INVASIVE CV LAB;  Service: Cardiovascular;  Laterality: Right;   SIGMOIDOSCOPY  1998   [facility]   TRANSURETHRAL RESECTION OF BLADDER TUMOR  05/11/2012   Dr. Joseph Art, [facility]   VASECTOMY  2002   Bilateral inguinal herniorrhaphies    HEMATOLOGY/ONCOLOGY HISTORY:  Oncology History  Urothelial carcinoma of kidney  08/19/2022 Initial Diagnosis   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HCC)   08/19/2022 Cancer Staging   Staging form: Kidney, AJCC 8th Edition - Clinical stage from 08/19/2022: Stage IV (cT4, cN1, cM1) - Signed by Jeralyn Ruths, MD on 08/19/2022   08/27/2022 - 10/01/2022 Chemotherapy   Patient is on Treatment Plan : Urothelial carcinoma of kidney: Cisplatin D1 + Gemcitabine D1,8 q21d x 6 Cycles     10/26/2022 -  Chemotherapy   Patient is on Treatment Plan : BLADDER Pembrolizumab (200) q21d       ALLERGIES:  is allergic to amoxicillin, atenolol, other, oxycodone, and shellfish allergy.  MEDICATIONS:  Current Outpatient Medications  Medication Sig Dispense Refill   acetaminophen (TYLENOL) 500 MG tablet Take 500 mg by mouth every 6 (six) hours as needed.     apixaban (ELIQUIS) 5 MG TABS tablet Take 1 tablet (5 mg total) by mouth 2 (two) times daily. 360 tablet 0   docusate sodium (COLACE) 250 MG capsule Take 250 mg by mouth daily.     fentaNYL (DURAGESIC) 50 MCG/HR Place 1 patch onto the skin  every 3 (three) days as needed. 5 patch 0   HYDROcodone-acetaminophen (NORCO) 5-325 MG tablet Take 1-2 tablets by mouth every 4 (four) hours as needed for moderate pain. 30 tablet 0   ipratropium-albuterol (DUONEB) 0.5-2.5 (3) MG/3ML SOLN Take 3 mLs by nebulization every 4 (four) hours as needed.     metoCLOPramide (REGLAN) 10 MG tablet Take 1 tablet (10 mg total) by mouth 4 (four) times daily. 120  tablet 1   metoprolol succinate (TOPROL-XL) 25 MG 24 hr tablet TAKE 1 TABLET (25 MG TOTAL) BY MOUTH DAILY. 90 tablet 0   ondansetron (ZOFRAN-ODT) 8 MG disintegrating tablet Take 8 mg by mouth 2 (two) times daily.     pantoprazole (PROTONIX) 40 MG tablet TAKE 1 TABLET BY MOUTH EVERY DAY 90 tablet 0   tamsulosin (FLOMAX) 0.4 MG CAPS capsule Take 1 capsule (0.4 mg total) by mouth daily. 30 capsule 11   traZODone (DESYREL) 50 MG tablet Take 0.5 tablets (25 mg total) by mouth at bedtime as needed for sleep. 30 tablet 0   No current facility-administered medications for this visit.   Facility-Administered Medications Ordered in Other Visits  Medication Dose Route Frequency Provider Last Rate Last Admin   heparin lock flush 100 unit/mL  500 Units Intravenous Once Alinda Dooms, NP       heparin lock flush 100 unit/mL  500 Units Intracatheter Once PRN Jeralyn Ruths, MD       sodium chloride flush (NS) 0.9 % injection 10 mL  10 mL Intravenous Once Jeralyn Ruths, MD       sodium chloride flush (NS) 0.9 % injection 10 mL  10 mL Intracatheter Once PRN Jeralyn Ruths, MD        VITAL SIGNS: BP 99/77   Pulse (!) 116   Temp 97.7 F (36.5 C) (Tympanic)   Resp (!) 24  There were no vitals filed for this visit.  Estimated body mass index is 20.98 kg/m as calculated from the following:   Height as of 10/22/22: 5\' 9"  (1.753 m).   Weight as of 10/26/22: 142 lb 1.6 oz (64.5 kg).  LABS: CBC:    Component Value Date/Time   WBC 19.4 (H) 10/26/2022 0930   WBC 16.8 (H) 10/15/2022 0635   HGB 9.3 (L) 10/26/2022 0930   HGB 16.7 12/02/2021 1332   HCT 29.3 (L) 10/26/2022 0930   HCT 46.9 12/02/2021 1332   PLT 455 (H) 10/26/2022 0930   PLT 359 12/02/2021 1332   MCV 90.4 10/26/2022 0930   MCV 93 12/02/2021 1332   NEUTROABS 15.8 (H) 10/26/2022 0930   NEUTROABS 4.7 06/10/2020 0855   LYMPHSABS 0.9 10/26/2022 0930   LYMPHSABS 2.2 06/10/2020 0855   MONOABS 2.0 (H) 10/26/2022 0930   EOSABS  0.3 10/26/2022 0930   EOSABS 0.2 06/10/2020 0855   BASOSABS 0.1 10/26/2022 0930   BASOSABS 0.1 06/10/2020 0855   Comprehensive Metabolic Panel:    Component Value Date/Time   NA 128 (L) 10/26/2022 0930   NA 142 12/02/2021 1332   K 4.5 10/26/2022 0930   CL 96 (L) 10/26/2022 0930   CO2 25 10/26/2022 0930   BUN 17 10/26/2022 0930   BUN 12 12/02/2021 1332   CREATININE 0.90 10/26/2022 0930   GLUCOSE 158 (H) 10/26/2022 0930   CALCIUM 9.6 10/26/2022 0930   AST 34 10/26/2022 0930   ALT 35 10/26/2022 0930   ALKPHOS 329 (H) 10/26/2022 0930   BILITOT 0.9 10/26/2022 0930   PROT 7.0 10/26/2022  0930   PROT 7.7 12/02/2021 1332   ALBUMIN 2.4 (L) 10/26/2022 0930   ALBUMIN 4.7 12/02/2021 1332    RADIOGRAPHIC STUDIES: CT ABDOMEN PELVIS WO CONTRAST  Result Date: 10/14/2022 CLINICAL DATA:  Metastatic urothelial cancer. Abdominal pain. * Tracking Code: BO * EXAM: CT ABDOMEN AND PELVIS WITHOUT CONTRAST TECHNIQUE: Multidetector CT imaging of the abdomen and pelvis was performed following the standard protocol without IV contrast. RADIATION DOSE REDUCTION: This exam was performed according to the departmental dose-optimization program which includes automated exposure control, adjustment of the mA and/or kV according to patient size and/or use of iterative reconstruction technique. COMPARISON:  CT abdomen and pelvis 09/07/2022. Please see separate dictation of chest CT as well from earlier 10/14/2022 FINDINGS: Lower chest: Numerous pulmonary nodules again identified. These increased from previous. Again please correlate with chest CT scan. Hepatobiliary: On this non IV contrast exam, there is a heterogeneous mass centrally in the right hepatic lobe which is larger today. On series 2, image 19 this measures 4.9 by 4.1 cm and previously measured only 2.2 x 1.6 cm. Other smaller lesions are identified as well in the liver. Less well defined without the advantage of IV contrast. The gallbladder is nondilated.  Pancreas: Unremarkable. No pancreatic ductal dilatation or surrounding inflammatory changes. Spleen: Normal in size without focal abnormality. Adrenals/Urinary Tract: Left adrenal nodule again seen. Previously 17 x 15 mm and today 17 x 13 mm, similar. Right adrenal nodule today measures 2.4 by 1.9 cm on series 2, image 31 and previously 15 x 11 mm. There is contrast in the renal collecting system from the prior CT scan of the chest. There is a large infiltrative mass involving the posterior aspect of the right kidney with several adjacent retroperitoneal perinephric space nodules. The renal mass on the prior measured 6.5 x 4.4 cm and today is estimated in diameter on series 2, image 39 of 7.2 x 5.2 cm. The adjacent nodules are increasing as well. Example more caudal on series 2, image 57 measures 16 x 12 mm today and previously 16 x 9 mm. There is confluent abnormal soft tissue extending into the retroperitoneum as well. Preserved contours of the urinary bladder. Left-sided exophytic Bosniak 1 renal cyst laterally. Stomach/Bowel: Large bowel has a normal course and caliber with scattered stool. Few left-sided colonic diverticula. Normal appendix. Stomach and small bowel are nondilated. Vascular/Lymphatic: Normal caliber aorta with scattered vascular calcifications. Branch vessel calcifications are seen as well. There has been placement of the stent extending from the infrarenal IVC down into the common iliac veins. Patency of the stent is limited without the advantage of contrast. Once again there is abnormal soft tissue in the nodes seen along the retroperitoneum. The area retrocaval which on the prior measured 4.7 x 2.7 cm, today on series 2, image 41 measures 4.7 by 4.1 cm. There is further extension into the adjacent vertebral body as well as seen on series 2, image 42 of the axial data set and on sagittal image 59 of series 6. Several other abnormal nodes throughout the retroperitoneum are also seen. Example at  the bifurcation of the IVC measures today 3.3 x 2.4 cm on series 2, image 52 and previously 3.2 x 2.2 cm. Reproductive: Enlarged prostate with mass effect along the base of the bladder. Other: Mild anasarca. Musculoskeletal: Curvature and degenerative changes of the spine. Trace anterolisthesis of L5 on S1 with pars defects. Multilevel stenosis. Again there is bony erosion by adjacent abnormal lymph nodes at the L2 level.  IMPRESSION: Overall progression of known neoplasm. Increasing liver metastases. Right-sided renal masses slight larger in the adjacent perinephric space and retroperitoneum nodules are increasing. Abnormal retroperitoneal lymph node enlargement again identified some areas are similar but others are increasing. In addition there is further extension of soft tissue mass extending into the L2 vertebral body. No bowel obstruction or free air.  Normal appendix. Interval placement of the stent from the IVC into the common iliac veins. Evaluation of patency is limited without IV contrast. Increasing right-sided adrenal nodule.  Stable left. Increasing lung metastases as seen on separate CT scan of the chest Electronically Signed   By: Karen Kays M.D.   On: 10/14/2022 13:59   DG Chest Port 1 View  Result Date: 10/14/2022 CLINICAL DATA:  Sepsis.  History of metastatic urothelial cancer EXAM: PORTABLE CHEST 1 VIEW COMPARISON:  Same day chest CT FINDINGS: Right-sided chest port in place with distal tip at the superior cavoatrial junction. Heart size is normal. Innumerable pulmonary nodules throughout both lungs compatible with known pulmonary metastatic disease. Trace right pleural effusion. No pneumothorax. IMPRESSION: 1. Innumerable pulmonary nodules throughout both lungs compatible with known pulmonary metastatic disease. 2. Trace right pleural effusion. Electronically Signed   By: Duanne Guess D.O.   On: 10/14/2022 13:30   CT Angio Chest PE W/Cm &/Or Wo Cm  Result Date: 10/14/2022 CLINICAL  DATA:  Chest pain. History of metastatic urothelial cancer. EXAM: CT ANGIOGRAPHY CHEST WITH CONTRAST TECHNIQUE: Multidetector CT imaging of the chest was performed using the standard protocol during bolus administration of intravenous contrast. Multiplanar CT image reconstructions and MIPs were obtained to evaluate the vascular anatomy. RADIATION DOSE REDUCTION: This exam was performed according to the departmental dose-optimization program which includes automated exposure control, adjustment of the mA and/or kV according to patient size and/or use of iterative reconstruction technique. CONTRAST:  50mL OMNIPAQUE IOHEXOL 350 MG/ML SOLN COMPARISON:  CT chest dated September 07, 2022. FINDINGS: Cardiovascular: Satisfactory opacification of the pulmonary arteries to the segmental level. No evidence of pulmonary embolism. Normal heart size. No pericardial effusion. No thoracic aortic aneurysm or dissection. Mediastinum/Nodes: Multiple mediastinal and bilateral hilar lymph nodes are stable to slightly increased in size. For example, a subcarinal lymph node currently measures 1.1 cm in short axis, previously 7 mm. No enlarged axillary lymph nodes. Thyroid gland, trachea, and esophagus demonstrate no significant findings. Lungs/Pleura: Marked interval increase in size and number of widespread pulmonary metastases. Previously noted cavitary lesion in the posterior right lower lobe has resolved. Increased trace right pleural effusion. No pneumothorax. Upper Abdomen: Large infiltrative mass involving the posterior right kidney is not significantly changed. A similar conglomerate retrocaval lymphadenopathy and perinephric retroperitoneal nodularity. Stable left and enlarging right adrenal metastases. Interval IVC stenting. Musculoskeletal: No chest wall abnormality. No acute or significant osseous findings. Review of the MIP images confirms the above findings. IMPRESSION: 1. No evidence of pulmonary embolism. 2. Progressive  widespread pulmonary metastatic disease. 3. Increased trace right pleural effusion. 4. Similar large infiltrative mass involving the posterior right kidney. Similar conglomerate retrocaval lymphadenopathy and perinephric retroperitoneal nodularity. 5. Stable left and enlarging right adrenal metastases. Electronically Signed   By: Obie Dredge M.D.   On: 10/14/2022 13:11   DG Chest 2 View  Result Date: 10/05/2022 CLINICAL DATA:  Cough- urothelial cancer metastatic to lung AND hx of PE. New cough x 1 week; eval for pneumonia EXAM: CHEST - 2 VIEW COMPARISON:  Radiograph 09/07/2022 FINDINGS: Chest port catheter tip overlies the distal SVC. Unchanged  cardiomediastinal silhouette. There are diffuse nodular opacities in the lungs compatible with known metastatic disease, appears more prominent in size and number in comparison to recent radiograph. No large effusion or evidence of pneumothorax. No acute osseous abnormality. IMPRESSION: Diffuse nodular opacities consistent with known pulmonary metastatic disease, appears more prominent in size and number in comparison to recent chest radiograph in February, could reflect progression of disease or a superimposed infectious/inflammatory process. Electronically Signed   By: Caprice Renshaw M.D.   On: 10/05/2022 11:44    PERFORMANCE STATUS (ECOG) : 2 - Symptomatic, <50% confined to bed  Review of Systems Unless otherwise noted, a complete review of systems is negative.  Physical Exam General: NAD Cardiovascular: regular rate and rhythm Pulmonary: clear ant fields Abdomen: soft, nontender, + bowel sounds GU: no suprapubic tenderness Extremities: RLE edema, no joint deformities Skin: no rashes Neurological: Weakness but otherwise nonfocal  IMPRESSION/PLAN: Right flank/testicular pain-likely secondary to known urothelial cancer with recent CT showing large infiltrative mass involving the right kidney extending in the renal hilum and increase number and size of  perinephric nodules/retroperitoneal stranding.  Patient does have enlarged right testicle but no acute urinary symptoms.  Will send for testicular ultrasound and obtain UA/culture.  Suspect that symptoms are malignancy related, however.  Patient is taking Norco with some improvement in symptoms.  Wife has noted that pain is markedly worse on the day the fentanyl is due to be changed.  Will increase frequency of fentanyl to every 48 hour dosing.  Poor oral intake -this is likely due to his cancer and overall poor tolerance to treatment.  Wife is interested in trial of an appetite stimulant.  Will start on low-dose mirtazapine.  Patient was seen today by nutrition.  Will bring patient back tomorrow for fluids.  Case and plan discussed with Dr. Orlie Dakin  Patient expressed understanding and was in agreement with this plan. He also understands that He can call clinic at any time with any questions, concerns, or complaints.   Thank you for allowing me to participate in the care of this very pleasant patient.   Time Total: 15 minutes  Visit consisted of counseling and education dealing with the complex and emotionally intense issues of symptom management in the setting of serious illness.Greater than 50%  of this time was spent counseling and coordinating care related to the above assessment and plan.  Signed by: Laurette Schimke, PhD, NP-C

## 2022-10-29 NOTE — Telephone Encounter (Signed)
Josh evaluated pt today in [facility]

## 2022-10-30 ENCOUNTER — Inpatient Hospital Stay: Payer: BLUE CROSS/BLUE SHIELD

## 2022-10-30 ENCOUNTER — Encounter: Payer: Self-pay | Admitting: Oncology

## 2022-10-30 ENCOUNTER — Other Ambulatory Visit: Payer: Self-pay

## 2022-10-30 ENCOUNTER — Telehealth: Payer: Self-pay | Admitting: *Deleted

## 2022-10-30 ENCOUNTER — Other Ambulatory Visit: Payer: Self-pay | Admitting: *Deleted

## 2022-10-30 ENCOUNTER — Ambulatory Visit
Admission: RE | Admit: 2022-10-30 | Discharge: 2022-10-30 | Disposition: A | Discharge status: Home / Self Care | Payer: BLUE CROSS/BLUE SHIELD | Source: Ambulatory Visit | Attending: Hospice and Palliative Medicine | Admitting: Hospice and Palliative Medicine

## 2022-10-30 VITALS — BP 109/67 | HR 57 | Temp 97.6°F | Resp 20

## 2022-10-30 DIAGNOSIS — Z5112 Encounter for antineoplastic immunotherapy: Secondary | ICD-10-CM | POA: Diagnosis not present

## 2022-10-30 DIAGNOSIS — C642 Malignant neoplasm of left kidney, except renal pelvis: Secondary | ICD-10-CM | POA: Diagnosis present

## 2022-10-30 DIAGNOSIS — R300 Dysuria: Secondary | ICD-10-CM

## 2022-10-30 DIAGNOSIS — N50819 Testicular pain, unspecified: Secondary | ICD-10-CM | POA: Insufficient documentation

## 2022-10-30 LAB — URINALYSIS, COMPLETE (UACMP) WITH MICROSCOPIC
Bilirubin Urine: NEGATIVE
Glucose, UA: NEGATIVE mg/dL
Ketones, ur: NEGATIVE mg/dL
Leukocytes,Ua: NEGATIVE
Nitrite: NEGATIVE
Protein, ur: 30 mg/dL — AB
RBC / HPF: >50 RBC/hpf (ref 0–5)
Specific Gravity, Urine: 1.024 (ref 1.005–1.030)
pH: 5 (ref 5.0–8.0)

## 2022-10-30 MED ORDER — SODIUM CHLORIDE 0.9 % IV SOLN
Freq: Once | INTRAVENOUS | Status: AC
  Filled 2022-10-30: qty 250

## 2022-10-30 MED ORDER — HEPARIN SOD (PORK) LOCK FLUSH 100 UNIT/ML IV SOLN
500.0000 [IU] | Freq: Once | INTRAVENOUS | Status: AC | PRN
  Administered 2022-10-30: 500 [IU]
  Filled 2022-10-30: qty 5

## 2022-10-30 MED ORDER — SODIUM CHLORIDE 0.9% FLUSH
10.0000 mL | Freq: Once | INTRAVENOUS | Status: AC | PRN
  Administered 2022-10-30: 10 mL
  Filled 2022-10-30: qty 10

## 2022-10-30 NOTE — Telephone Encounter (Signed)
RN called patient's wife cell. As of 1151, We have not seen patient arrive in the fluid clinic. Appears that the u/s was scheduled this morning but patient is showing in process. No answer. Vm left for pt's wife

## 2022-11-01 LAB — URINE CULTURE: Culture: NO GROWTH

## 2022-11-02 ENCOUNTER — Inpatient Hospital Stay: Payer: BLUE CROSS/BLUE SHIELD

## 2022-11-02 ENCOUNTER — Encounter: Payer: Self-pay | Admitting: Nurse Practitioner

## 2022-11-02 ENCOUNTER — Inpatient Hospital Stay (HOSPITAL_BASED_OUTPATIENT_CLINIC_OR_DEPARTMENT_OTHER): Payer: BLUE CROSS/BLUE SHIELD | Admitting: Nurse Practitioner

## 2022-11-02 ENCOUNTER — Inpatient Hospital Stay (HOSPITAL_BASED_OUTPATIENT_CLINIC_OR_DEPARTMENT_OTHER): Payer: BLUE CROSS/BLUE SHIELD | Admitting: Hospice and Palliative Medicine

## 2022-11-02 ENCOUNTER — Telehealth: Payer: Self-pay | Admitting: *Deleted

## 2022-11-02 VITALS — BP 137/73 | HR 134 | Temp 97.5°F | Resp 18

## 2022-11-02 DIAGNOSIS — C7910 Secondary malignant neoplasm of unspecified urinary organs: Secondary | ICD-10-CM

## 2022-11-02 DIAGNOSIS — C642 Malignant neoplasm of left kidney, except renal pelvis: Secondary | ICD-10-CM

## 2022-11-02 DIAGNOSIS — G893 Neoplasm related pain (acute) (chronic): Secondary | ICD-10-CM | POA: Diagnosis not present

## 2022-11-02 DIAGNOSIS — Z7189 Other specified counseling: Secondary | ICD-10-CM

## 2022-11-02 DIAGNOSIS — D649 Anemia, unspecified: Secondary | ICD-10-CM

## 2022-11-02 DIAGNOSIS — Z5112 Encounter for antineoplastic immunotherapy: Secondary | ICD-10-CM | POA: Diagnosis not present

## 2022-11-02 DIAGNOSIS — Z515 Encounter for palliative care: Secondary | ICD-10-CM

## 2022-11-02 LAB — CMP (CANCER CENTER ONLY)
ALT: 29 U/L (ref 0–44)
AST: 39 U/L (ref 15–41)
Albumin: 2.4 g/dL — ABNORMAL LOW (ref 3.5–5.0)
Alkaline Phosphatase: 338 U/L — ABNORMAL HIGH (ref 38–126)
Anion gap: 9 (ref 5–15)
BUN: 29 mg/dL — ABNORMAL HIGH (ref 8–23)
CO2: 25 mmol/L (ref 22–32)
Calcium: 12.1 mg/dL — ABNORMAL HIGH (ref 8.9–10.3)
Chloride: 98 mmol/L (ref 98–111)
Creatinine: 0.96 mg/dL (ref 0.61–1.24)
GFR, Estimated: >60 mL/min (ref >60)
Glucose, Bld: 110 mg/dL — ABNORMAL HIGH (ref 70–99)
Potassium: 4.7 mmol/L (ref 3.5–5.1)
Sodium: 132 mmol/L — ABNORMAL LOW (ref 135–145)
Total Bilirubin: 0.9 mg/dL (ref 0.3–1.2)
Total Protein: 6.7 g/dL (ref 6.5–8.1)

## 2022-11-02 LAB — CBC WITH DIFFERENTIAL (CANCER CENTER ONLY)
Abs Immature Granulocytes: 0.45 10*3/uL — ABNORMAL HIGH (ref 0.00–0.07)
Basophils Absolute: 0.1 10*3/uL (ref 0.0–0.1)
Basophils Relative: 0 %
Eosinophils Absolute: 0.4 10*3/uL (ref 0.0–0.5)
Eosinophils Relative: 2 %
HCT: 28.8 % — ABNORMAL LOW (ref 39.0–52.0)
Hemoglobin: 9 g/dL — ABNORMAL LOW (ref 13.0–17.0)
Immature Granulocytes: 2 %
Lymphocytes Relative: 5 %
Lymphs Abs: 1.2 10*3/uL (ref 0.7–4.0)
MCH: 28.2 pg (ref 26.0–34.0)
MCHC: 31.3 g/dL (ref 30.0–36.0)
MCV: 90.3 fL (ref 80.0–100.0)
Monocytes Absolute: 1.8 10*3/uL — ABNORMAL HIGH (ref 0.1–1.0)
Monocytes Relative: 7 %
Neutro Abs: 21.5 10*3/uL — ABNORMAL HIGH (ref 1.7–7.7)
Neutrophils Relative %: 84 %
Platelet Count: 548 10*3/uL — ABNORMAL HIGH (ref 150–400)
RBC: 3.19 MIL/uL — ABNORMAL LOW (ref 4.22–5.81)
RDW: 15.9 % — ABNORMAL HIGH (ref 11.5–15.5)
WBC Count: 25.4 10*3/uL — ABNORMAL HIGH (ref 4.0–10.5)
nRBC: 0 % (ref 0.0–0.2)

## 2022-11-02 MED ORDER — ATROPINE SULFATE 1 % OP OINT: TOPICAL_OINTMENT | OPHTHALMIC | 12 refills | Status: DC

## 2022-11-02 MED ORDER — SODIUM CHLORIDE 0.9 % IV SOLN
INTRAVENOUS | Status: DC
  Filled 2022-11-02 (×2): qty 250

## 2022-11-02 MED ORDER — MORPHINE SULFATE (PF) 2 MG/ML IV SOLN
4.0000 mg | Freq: Once | INTRAVENOUS | Status: AC
  Administered 2022-11-02: 4 mg via INTRAVENOUS
  Filled 2022-11-02: qty 2

## 2022-11-02 MED ORDER — SODIUM CHLORIDE 0.9% FLUSH
10.0000 mL | Freq: Once | INTRAVENOUS | Status: DC
  Filled 2022-11-02: qty 10

## 2022-11-02 MED ORDER — MORPHINE SULFATE (PF) 2 MG/ML IV SOLN: 4.0000 mg | Freq: Once | INTRAVENOUS | Status: AC

## 2022-11-02 MED ORDER — MORPHINE SULFATE (CONCENTRATE) 10 MG /0.5 ML PO SOLN: 10.0000 mg | ORAL | 0 refills | Status: DC | PRN

## 2022-11-02 MED ORDER — HEPARIN SOD (PORK) LOCK FLUSH 100 UNIT/ML IV SOLN
500.0000 [IU] | Freq: Once | INTRAVENOUS | Status: AC
  Administered 2022-11-02: 500 [IU] via INTRAVENOUS
  Filled 2022-11-02: qty 5

## 2022-11-02 NOTE — Progress Notes (Signed)
[facility]pt[facility]  [facility] A Department of the [place]. [facility] [address], [address] [place], [place] [address] [phone] (phone) [phone] (fax)  Patient Care Team: Malva Limes, MD as PCP - General (Family Medicine) Sondra Come, MD as Consulting Physician (Urology) Jeralyn Ruths, MD as Consulting Physician (Oncology)   Name of the patient: Jeffrey Fernandez  [id number]  [birth year]   Date of visit: 11/02/22  Diagnosis- Metastatic Urothelial Carcinoma  Chief complaint/ Reason for visit- pain  Heme/Onc history:  Oncology History  Urothelial carcinoma of kidney  08/19/2022 Initial Diagnosis   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HCC)   08/19/2022 Cancer Staging   Staging form: Kidney, AJCC 8th Edition - Clinical stage from 08/19/2022: Stage IV (cT4, cN1, cM1) - Signed by Jeralyn Ruths, MD on 08/19/2022   08/27/2022 - 10/01/2022 Chemotherapy   Patient is on Treatment Plan : Urothelial carcinoma of kidney: Cisplatin D1 + Gemcitabine D1,8 q21d x 6 Cycles     10/26/2022 -  Chemotherapy   Patient is on Treatment Plan : BLADDER Pembrolizumab (200) q21d       Interval history- Patient is 67 year old male, diagnosed with metastatic urothelial carcinoma, currently s/p cycle 1 of second line pembrolizumab, who presents to [facility] for concerns of increased pain. He has been continuing to decline per family. Not eating solid food. Drinks very little. Has become agitated and confused at varying episodes then returns to baseline. He has worsening pain, particularly with transferring and traveling. Family has started coming in to home to visit with patient given his declining condition.   ECOG FS:4 - Bedbound  Review of systems- Review of Systems  Unable to perform ROS: Acuity of condition  Constitutional:  Positive for malaise/fatigue and weight loss. Negative for chills and fever.   Respiratory:  Positive for shortness of breath.   Gastrointestinal:  Negative for abdominal pain.  Neurological:  Positive for weakness.     Allergies  Allergen Reactions   Amoxicillin Itching   Atenolol Diarrhea    Other reaction(s): HIVES   Other Nausea And Vomiting    Navy Beans   Oxycodone     Other reaction(s): HIVES   Shellfish Allergy Itching and Swelling    Past Medical History:  Diagnosis Date   Cancer 2013   bladder   Diverticulitis    History of chickenpox    History of measles    History of mumps    Hyperlipidemia    Hypertension     Past Surgical History:  Procedure Laterality Date   HERNIA REPAIR  2002,2003   IR IMAGING GUIDED PORT INSERTION  08/25/2022   IVC VENOGRAPHY N/A 09/11/2022   Procedure: IVC Venography;  Surgeon: Renford Dills, MD;  Location: [facility] INVASIVE CV LAB;  Service: Cardiovascular;  Laterality: N/A;   PERIPHERAL VASCULAR THROMBECTOMY Right 09/08/2022   Procedure: PERIPHERAL VASCULAR THROMBECTOMY;  Surgeon: Renford Dills, MD;  Location: [facility] INVASIVE CV LAB;  Service: Cardiovascular;  Laterality: Right;   SIGMOIDOSCOPY  1998   [facility]   TRANSURETHRAL RESECTION OF BLADDER TUMOR  05/11/2012   Dr. Joseph Art, [facility]   VASECTOMY  2002   Bilateral inguinal herniorrhaphies    Social History   Socioeconomic History   Marital status: Married    Spouse name: Victorino Dike   Number of children: 3   Years of education: Not on file   Highest education level: Not on file  Occupational History   Occupation: Naval architect  Tobacco Use   Smoking status: Former    Packs/day: 2.00    Years: 39.00    Additional pack years: 0.00    Total pack years: 78.00    Types: Cigarettes    Quit date: 07/14/2011    Years since quitting: 11.3    Passive exposure: Past   Smokeless tobacco: Never   Tobacco comments:    smoked 1 to 1 1/2  ppd for 35 years  Vaping Use   Vaping Use: Never used  Substance and Sexual Activity   Alcohol use:  Yes    Comment: drinks 2 beers daily: none since 1 week ago   Drug use: No   Sexual activity: Not Currently  Other Topics Concern   Not on file  Social History Narrative   Lives at home with wife , daughter , 3 grand daughters, son & his 2 kids    Social Determinants of Health   Financial Resource Strain: Not on file  Food Insecurity: No Food Insecurity (09/07/2022)   Hunger Vital Sign    Worried About Running Out of Food in the Last Year: Never true    Ran Out of Food in the Last Year: Never true  Transportation Needs: No Transportation Needs (09/07/2022)   PRAPARE - Administrator, Civil Service (Medical): No    Lack of Transportation (Non-Medical): No  Physical Activity: Not on file  Stress: Not on file  Social Connections: Not on file  Intimate Partner Violence: Not At Risk (09/07/2022)   Humiliation, Afraid, Rape, and Kick questionnaire    Fear of Current or Ex-Partner: No    Emotionally Abused: No    Physically Abused: No    Sexually Abused: No    Family History  Problem Relation Age of Onset   COPD Mother    Diverticulosis Mother    Diverticulosis Father    Diabetes Father    Hypertension Father    Diverticulosis Sister    Stroke Paternal Grandmother      Current Outpatient Medications:    acetaminophen (TYLENOL) 500 MG tablet, Take 500 mg by mouth every 6 (six) hours as needed., Disp: , Rfl:    apixaban (ELIQUIS) 5 MG TABS tablet, Take 1 tablet (5 mg total) by mouth 2 (two) times daily., Disp: 360 tablet, Rfl: 0   docusate sodium (COLACE) 250 MG capsule, Take 250 mg by mouth daily., Disp: , Rfl:    fentaNYL (DURAGESIC) 50 MCG/HR, Place 1 patch onto the skin every other day as needed., Disp: 10 patch, Rfl: 0   HYDROcodone-acetaminophen (NORCO) 5-325 MG tablet, Take 1-2 tablets by mouth every 4 (four) hours as needed for moderate pain., Disp: 30 tablet, Rfl: 0   ipratropium-albuterol (DUONEB) 0.5-2.5 (3) MG/3ML SOLN, Take 3 mLs by nebulization every 4  (four) hours as needed., Disp: , Rfl:    metoCLOPramide (REGLAN) 10 MG tablet, Take 1 tablet (10 mg total) by mouth 4 (four) times daily., Disp: 120 tablet, Rfl: 1   metoprolol succinate (TOPROL-XL) 25 MG 24 hr tablet, TAKE 1 TABLET (25 MG TOTAL) BY MOUTH DAILY., Disp: 90 tablet, Rfl: 0   mirtazapine (REMERON) 7.5 MG tablet, Take 1 tablet (7.5 mg total) by mouth at bedtime., Disp: 30 tablet, Rfl: 2   ondansetron (ZOFRAN-ODT) 8 MG disintegrating tablet, Take 8 mg by mouth 2 (two) times daily., Disp: , Rfl:    pantoprazole (PROTONIX) 40 MG tablet, TAKE 1 TABLET BY MOUTH EVERY DAY,  Disp: 90 tablet, Rfl: 0   tamsulosin (FLOMAX) 0.4 MG CAPS capsule, Take 1 capsule (0.4 mg total) by mouth daily., Disp: 30 capsule, Rfl: 11   traZODone (DESYREL) 50 MG tablet, Take 0.5 tablets (25 mg total) by mouth at bedtime as needed for sleep., Disp: 30 tablet, Rfl: 0 No current facility-administered medications for this visit.  Facility-Administered Medications Ordered in Other Visits:    heparin lock flush 100 unit/mL, 500 Units, Intravenous, Once, Alinda Dooms, NP   heparin lock flush 100 unit/mL, 500 Units, Intravenous, Once, Alinda Dooms, NP   sodium chloride flush (NS) 0.9 % injection 10 mL, 10 mL, Intravenous, Once, Finnegan, Tollie Pizza, MD   sodium chloride flush (NS) 0.9 % injection 10 mL, 10 mL, Intravenous, Once, Alinda Dooms, NP  Physical exam:  Vitals:   11/02/22 1310  BP: 137/73  Pulse: (!) 134  Resp: 18  Temp: (!) 97.5 F (36.4 C)  TempSrc: Tympanic  SpO2: 96%   Physical Exam Vitals reviewed.  Constitutional:      Appearance: He is ill-appearing.     Interventions: Nasal cannula in place.     Comments: Gray skin tone. Wife and daughter accompany him.  HENT:     Mouth/Throat:     Mouth: Mucous membranes are dry.  Eyes:     Comments: Little eye contact.  Cardiovascular:     Rate and Rhythm: Tachycardia present.  Pulmonary:     Comments: Open mouth breathing Skin:    General:  Skin is cool and dry.     Coloration: Skin is ashen and pale.  Neurological:     Mental Status: Mental status is at baseline. He is lethargic.         Latest Ref Rng & Units 10/26/2022    9:30 AM  CMP  Glucose 70 - 99 mg/dL 409   BUN 8 - 23 mg/dL 17   Creatinine 8.11 - 1.24 mg/dL 9.14   Sodium 782 - 956 mmol/L 128   Potassium 3.5 - 5.1 mmol/L 4.5   Chloride 98 - 111 mmol/L 96   CO2 22 - 32 mmol/L 25   Calcium 8.9 - 10.3 mg/dL 9.6   Total Protein 6.5 - 8.1 g/dL 7.0   Total Bilirubin 0.3 - 1.2 mg/dL 0.9   Alkaline Phos 38 - 126 U/L 329   AST 15 - 41 U/L 34   ALT 0 - 44 U/L 35       Latest Ref Rng & Units 10/26/2022    9:30 AM  CBC  WBC 4.0 - 10.5 K/uL 19.4   Hemoglobin 13.0 - 17.0 g/dL 9.3   Hematocrit 21.3 - 52.0 % 29.3   Platelets 150 - 400 K/uL 455     Assessment and plan- Patient is a 68 y.o. 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 Clinically, progressive disease. Reviewed comfort based care vs palliative treatments. I recommend hospice. Patient, family in agreement. Family requests that we not cancel appointments with Dr. Orlie Dakin just yet but they will contact clinic when prepared to do so. I've updated Dr Orlie Dakin re: patient plans.  Cancer related pain- followed by palliative care. Fentanyl incrased to 48 hour dosing on 10/29/22. Continues to have poor pain control. Given his adoption of hospice, defer outpatient management to hospice and Elouise Munroe, NP. Plan for morphine 4 mg IV in clinic today. I've updated Josh regarding patient wishes.  Poor intake- started on low dose  mirtazapine. Intolerant. Likely related to progressive disease. IV fluids today though limited utility.  Goals of care- treatment given with palliative intent. Currently followed by palliative care. Reviewed that clinically patient is declining and I feel that hospice would be appropriate. Patient agrees. He expresses that he desires to pass  away at home as opposed to in hospital. Daughter, who is former hospice nurse, would like to see Sharia Reeve Borders today to coordinate outpatient care. I've updated Josh and he'll see patient and family today.   Disposition:  IV fluids and morphine in clinic Add palliative care appointment for today Follow up as needed- la   Visit Diagnosis 1. Goals of care, counseling/discussion   2. Metastatic urothelial carcinoma   3. Cancer associated pain    Patient expressed understanding and was in agreement with this plan. He also understands that He can call clinic at any time with any questions, concerns, or complaints.   Thank you for allowing me to participate in the care of this very pleasant patient.   Consuello Masse, DNP, AGNP-C, AOCNP [facility] [phone]

## 2022-11-02 NOTE — Telephone Encounter (Signed)
CAll from daughter asking for patient to come in for IV fluids today (he is scheduled for IV fluids tomorrow). Also asking for O2 orders to be changed with O2 company that he can use up to 10 L/m so that he can get a different concentrator. He had a horrible night and had to increase his O2 to 3.5 L/m. Please advise

## 2022-11-02 NOTE — Progress Notes (Signed)
Symptom Management Clinic [facility] at [facility] [phone] Fax:([phone]) [phone]  Patient Care Team: Malva Limes, MD as PCP - General (Family Medicine) Sondra Come, MD as Consulting Physician (Urology) Jeralyn Ruths, MD as Consulting Physician (Oncology)   NAME OF PATIENT: Jeffrey Fernandez  [id number]  [birth year]   DATE OF VISIT: 11/02/22  REASON FOR CONSULT: RONIN REHFELDT is a 67 y.o. 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  PET scan on 08/14/2022 shows hypermetabolic kidney mass with retroperitoneal lymphadenopathy and multiple bilateral pulmonary nodules.  INTERVAL HISTORY: CT scan from October 14, 2022 showed disease progression.  Hospice was briefly discussed given overall declining performance status the patient opted to pursue second line Keytruda  Patient is required clinic visits for supportive care.  He presents today with complaint of minimal oral intake, declining performance status.  It was difficult for family to transfer patient to clinic today.  Denies any neurologic complaints. Denies recent fevers or illnesses. Denies any easy bleeding or bruising. Denies chest pain. Denies urinary complaints. Patient offers no further specific complaints today.  PAST MEDICAL HISTORY: Past Medical History:  Diagnosis Date   Cancer 2013   bladder   Diverticulitis    History of chickenpox    History of measles    History of mumps    Hyperlipidemia    Hypertension     PAST SURGICAL HISTORY:  Past Surgical History:  Procedure Laterality Date   HERNIA REPAIR  2002,2003   IR IMAGING GUIDED PORT INSERTION  08/25/2022   IVC VENOGRAPHY N/A 09/11/2022   Procedure: IVC Venography;  Surgeon: Renford Dills, MD;  Location: [facility] INVASIVE CV LAB;  Service: Cardiovascular;  Laterality: N/A;   PERIPHERAL VASCULAR THROMBECTOMY Right 09/08/2022   Procedure: PERIPHERAL VASCULAR  THROMBECTOMY;  Surgeon: Renford Dills, MD;  Location: [facility] INVASIVE CV LAB;  Service: Cardiovascular;  Laterality: Right;   SIGMOIDOSCOPY  1998   [facility] [facility]   TRANSURETHRAL RESECTION OF BLADDER TUMOR  05/11/2012   Dr. Joseph Art, [facility]   VASECTOMY  2002   Bilateral inguinal herniorrhaphies    HEMATOLOGY/ONCOLOGY HISTORY:  Oncology History  Urothelial carcinoma of kidney  08/19/2022 Initial Diagnosis   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HCC)   08/19/2022 Cancer Staging   Staging form: Kidney, AJCC 8th Edition - Clinical stage from 08/19/2022: Stage IV (cT4, cN1, cM1) - Signed by Jeralyn Ruths, MD on 08/19/2022   08/27/2022 - 10/01/2022 Chemotherapy   Patient is on Treatment Plan : Urothelial carcinoma of kidney: Cisplatin D1 + Gemcitabine D1,8 q21d x 6 Cycles     10/26/2022 -  Chemotherapy   Patient is on Treatment Plan : BLADDER Pembrolizumab (200) q21d       ALLERGIES:  is allergic to amoxicillin, atenolol, other, oxycodone, and shellfish allergy.  MEDICATIONS:  Current Outpatient Medications  Medication Sig Dispense Refill   acetaminophen (TYLENOL) 500 MG tablet Take 500 mg by mouth every 6 (six) hours as needed.     apixaban (ELIQUIS) 5 MG TABS tablet Take 1 tablet (5 mg total) by mouth 2 (two) times daily. 360 tablet 0   docusate sodium (COLACE) 250 MG capsule Take 250 mg by mouth daily.     fentaNYL (DURAGESIC) 50 MCG/HR Place 1 patch onto the skin every other day as needed. 10 patch 0   HYDROcodone-acetaminophen (NORCO) 5-325 MG tablet Take 1-2 tablets by mouth every 4 (four) hours as needed for moderate pain.  30 tablet 0   ipratropium-albuterol (DUONEB) 0.5-2.5 (3) MG/3ML SOLN Take 3 mLs by nebulization every 4 (four) hours as needed.     metoCLOPramide (REGLAN) 10 MG tablet Take 1 tablet (10 mg total) by mouth 4 (four) times daily. 120 tablet 1   metoprolol succinate (TOPROL-XL) 25 MG 24 hr tablet TAKE 1 TABLET (25 MG TOTAL) BY MOUTH DAILY. 90 tablet 0    mirtazapine (REMERON) 7.5 MG tablet Take 1 tablet (7.5 mg total) by mouth at bedtime. (Patient not taking: Reported on 11/02/2022) 30 tablet 2   ondansetron (ZOFRAN-ODT) 8 MG disintegrating tablet Take 8 mg by mouth 2 (two) times daily.     pantoprazole (PROTONIX) 40 MG tablet TAKE 1 TABLET BY MOUTH EVERY DAY 90 tablet 0   tamsulosin (FLOMAX) 0.4 MG CAPS capsule Take 1 capsule (0.4 mg total) by mouth daily. 30 capsule 11   traZODone (DESYREL) 50 MG tablet Take 0.5 tablets (25 mg total) by mouth at bedtime as needed for sleep. 30 tablet 0   No current facility-administered medications for this visit.   Facility-Administered Medications Ordered in Other Visits  Medication Dose Route Frequency Provider Last Rate Last Admin   0.9 %  sodium chloride infusion   Intravenous Continuous Alinda Dooms, NP 999 mL/hr at 11/02/22 1428 New Bag at 11/02/22 1428   heparin lock flush 100 unit/mL  500 Units Intravenous Once Alinda Dooms, NP       heparin lock flush 100 unit/mL  500 Units Intravenous Once Alinda Dooms, NP       sodium chloride flush (NS) 0.9 % injection 10 mL  10 mL Intravenous Once Jeralyn Ruths, MD       sodium chloride flush (NS) 0.9 % injection 10 mL  10 mL Intravenous Once Alinda Dooms, NP        VITAL SIGNS: There were no vitals taken for this visit. There were no vitals filed for this visit.  Estimated body mass index is 20.98 kg/m as calculated from the following:   Height as of 10/22/22: 5\' 9"  (1.753 m).   Weight as of 10/26/22: 142 lb 1.6 oz (64.5 kg).  LABS: CBC:    Component Value Date/Time   WBC 25.4 (H) 11/02/2022 1258   WBC 16.8 (H) 10/15/2022 0635   HGB 9.0 (L) 11/02/2022 1258   HGB 16.7 12/02/2021 1332   HCT 28.8 (L) 11/02/2022 1258   HCT 46.9 12/02/2021 1332   PLT 548 (H) 11/02/2022 1258   PLT 359 12/02/2021 1332   MCV 90.3 11/02/2022 1258   MCV 93 12/02/2021 1332   NEUTROABS 21.5 (H) 11/02/2022 1258   NEUTROABS 4.7 06/10/2020 0855   LYMPHSABS  1.2 11/02/2022 1258   LYMPHSABS 2.2 06/10/2020 0855   MONOABS 1.8 (H) 11/02/2022 1258   EOSABS 0.4 11/02/2022 1258   EOSABS 0.2 06/10/2020 0855   BASOSABS 0.1 11/02/2022 1258   BASOSABS 0.1 06/10/2020 0855   Comprehensive Metabolic Panel:    Component Value Date/Time   NA 132 (L) 11/02/2022 1258   NA 142 12/02/2021 1332   K 4.7 11/02/2022 1258   CL 98 11/02/2022 1258   CO2 25 11/02/2022 1258   BUN 29 (H) 11/02/2022 1258   BUN 12 12/02/2021 1332   CREATININE 0.96 11/02/2022 1258   GLUCOSE 110 (H) 11/02/2022 1258   CALCIUM 12.1 (H) 11/02/2022 1258   AST 39 11/02/2022 1258   ALT 29 11/02/2022 1258   ALKPHOS 338 (H) 11/02/2022 1258   BILITOT  0.9 11/02/2022 1258   PROT 6.7 11/02/2022 1258   PROT 7.7 12/02/2021 1332   ALBUMIN 2.4 (L) 11/02/2022 1258   ALBUMIN 4.7 12/02/2021 1332    RADIOGRAPHIC STUDIES: US SCROTUM W/DOPPLER  Result Date: 10/30/2022 CLINICAL DATA:  Distinct lower pain. EXAM: SCROTAL ULTRASOUND DOPPLER ULTRASOUND OF THE TESTICLES TECHNIQUE: Complete ultrasound examination of the testicles, epididymis, and other scrotal structures was performed. Color and spectral Doppler ultrasound were also utilized to evaluate blood flow to the testicles. COMPARISON:  None Available. FINDINGS: Right testicle Measurements: 4.5 x 2.5 x 2.9 cm. Testicle is unremarkable. A simple cyst adjacent to the epididymis measures up to 9 mm. Left testicle Measurements: 3.6 x 1.7 x 2.6 cm. No mass or microlithiasis visualized. Right epididymis: 2 simple cysts measuring 2.8 mm and 4.8 mm respectively. No hyperemia. Left epididymis: 1 simple cyst measures up to 4.2 mm. No hyperemia. Hydrocele:  Moderate right-sided hydrocele appears simple. Varicocele:  None Pulsed Doppler interrogation of both testes demonstrates normal low resistance arterial and venous waveforms bilaterally. IMPRESSION: 1. Moderate right-sided hydrocele. 2. Bilateral epididymal cysts. 3. Normal appearance of the testicles.  Electronically Signed   By: Marin Roberts M.D.   On: 10/30/2022 12:35   CT ABDOMEN PELVIS WO CONTRAST  Result Date: 10/14/2022 CLINICAL DATA:  Metastatic urothelial cancer. Abdominal pain. * Tracking Code: BO * EXAM: CT ABDOMEN AND PELVIS WITHOUT CONTRAST TECHNIQUE: Multidetector CT imaging of the abdomen and pelvis was performed following the standard protocol without IV contrast. RADIATION DOSE REDUCTION: This exam was performed according to the departmental dose-optimization program which includes automated exposure control, adjustment of the mA and/or kV according to patient size and/or use of iterative reconstruction technique. COMPARISON:  CT abdomen and pelvis 09/07/2022. Please see separate dictation of chest CT as well from earlier 10/14/2022 FINDINGS: Lower chest: Numerous pulmonary nodules again identified. These increased from previous. Again please correlate with chest CT scan. Hepatobiliary: On this non IV contrast exam, there is a heterogeneous mass centrally in the right hepatic lobe which is larger today. On series 2, image 19 this measures 4.9 by 4.1 cm and previously measured only 2.2 x 1.6 cm. Other smaller lesions are identified as well in the liver. Less well defined without the advantage of IV contrast. The gallbladder is nondilated. Pancreas: Unremarkable. No pancreatic ductal dilatation or surrounding inflammatory changes. Spleen: Normal in size without focal abnormality. Adrenals/Urinary Tract: Left adrenal nodule again seen. Previously 17 x 15 mm and today 17 x 13 mm, similar. Right adrenal nodule today measures 2.4 by 1.9 cm on series 2, image 31 and previously 15 x 11 mm. There is contrast in the renal collecting system from the prior CT scan of the chest. There is a large infiltrative mass involving the posterior aspect of the right kidney with several adjacent retroperitoneal perinephric space nodules. The renal mass on the prior measured 6.5 x 4.4 cm and today is estimated  in diameter on series 2, image 39 of 7.2 x 5.2 cm. The adjacent nodules are increasing as well. Example more caudal on series 2, image 57 measures 16 x 12 mm today and previously 16 x 9 mm. There is confluent abnormal soft tissue extending into the retroperitoneum as well. Preserved contours of the urinary bladder. Left-sided exophytic Bosniak 1 renal cyst laterally. Stomach/Bowel: Large bowel has a normal course and caliber with scattered stool. Few left-sided colonic diverticula. Normal appendix. Stomach and small bowel are nondilated. Vascular/Lymphatic: Normal caliber aorta with scattered vascular calcifications. Branch vessel calcifications  are seen as well. There has been placement of the stent extending from the infrarenal IVC down into the common iliac veins. Patency of the stent is limited without the advantage of contrast. Once again there is abnormal soft tissue in the nodes seen along the retroperitoneum. The area retrocaval which on the prior measured 4.7 x 2.7 cm, today on series 2, image 41 measures 4.7 by 4.1 cm. There is further extension into the adjacent vertebral body as well as seen on series 2, image 42 of the axial data set and on sagittal image 59 of series 6. Several other abnormal nodes throughout the retroperitoneum are also seen. Example at the bifurcation of the IVC measures today 3.3 x 2.4 cm on series 2, image 52 and previously 3.2 x 2.2 cm. Reproductive: Enlarged prostate with mass effect along the base of the bladder. Other: Mild anasarca. Musculoskeletal: Curvature and degenerative changes of the spine. Trace anterolisthesis of L5 on S1 with pars defects. Multilevel stenosis. Again there is bony erosion by adjacent abnormal lymph nodes at the L2 level. IMPRESSION: Overall progression of known neoplasm. Increasing liver metastases. Right-sided renal masses slight larger in the adjacent perinephric space and retroperitoneum nodules are increasing. Abnormal retroperitoneal lymph node  enlargement again identified some areas are similar but others are increasing. In addition there is further extension of soft tissue mass extending into the L2 vertebral body. No bowel obstruction or free air.  Normal appendix. Interval placement of the stent from the IVC into the common iliac veins. Evaluation of patency is limited without IV contrast. Increasing right-sided adrenal nodule.  Stable left. Increasing lung metastases as seen on separate CT scan of the chest Electronically Signed   By: Karen Kays M.D.   On: 10/14/2022 13:59   DG Chest Port 1 View  Result Date: 10/14/2022 CLINICAL DATA:  Sepsis.  History of metastatic urothelial cancer EXAM: PORTABLE CHEST 1 VIEW COMPARISON:  Same day chest CT FINDINGS: Right-sided chest port in place with distal tip at the superior cavoatrial junction. Heart size is normal. Innumerable pulmonary nodules throughout both lungs compatible with known pulmonary metastatic disease. Trace right pleural effusion. No pneumothorax. IMPRESSION: 1. Innumerable pulmonary nodules throughout both lungs compatible with known pulmonary metastatic disease. 2. Trace right pleural effusion. Electronically Signed   By: Duanne Guess D.O.   On: 10/14/2022 13:30   CT Angio Chest PE W/Cm &/Or Wo Cm  Result Date: 10/14/2022 CLINICAL DATA:  Chest pain. History of metastatic urothelial cancer. EXAM: CT ANGIOGRAPHY CHEST WITH CONTRAST TECHNIQUE: Multidetector CT imaging of the chest was performed using the standard protocol during bolus administration of intravenous contrast. Multiplanar CT image reconstructions and MIPs were obtained to evaluate the vascular anatomy. RADIATION DOSE REDUCTION: This exam was performed according to the departmental dose-optimization program which includes automated exposure control, adjustment of the mA and/or kV according to patient size and/or use of iterative reconstruction technique. CONTRAST:  50mL OMNIPAQUE IOHEXOL 350 MG/ML SOLN COMPARISON:  CT  chest dated September 07, 2022. FINDINGS: Cardiovascular: Satisfactory opacification of the pulmonary arteries to the segmental level. No evidence of pulmonary embolism. Normal heart size. No pericardial effusion. No thoracic aortic aneurysm or dissection. Mediastinum/Nodes: Multiple mediastinal and bilateral hilar lymph nodes are stable to slightly increased in size. For example, a subcarinal lymph node currently measures 1.1 cm in short axis, previously 7 mm. No enlarged axillary lymph nodes. Thyroid gland, trachea, and esophagus demonstrate no significant findings. Lungs/Pleura: Marked interval increase in size and number of widespread  pulmonary metastases. Previously noted cavitary lesion in the posterior right lower lobe has resolved. Increased trace right pleural effusion. No pneumothorax. Upper Abdomen: Large infiltrative mass involving the posterior right kidney is not significantly changed. A similar conglomerate retrocaval lymphadenopathy and perinephric retroperitoneal nodularity. Stable left and enlarging right adrenal metastases. Interval IVC stenting. Musculoskeletal: No chest wall abnormality. No acute or significant osseous findings. Review of the MIP images confirms the above findings. IMPRESSION: 1. No evidence of pulmonary embolism. 2. Progressive widespread pulmonary metastatic disease. 3. Increased trace right pleural effusion. 4. Similar large infiltrative mass involving the posterior right kidney. Similar conglomerate retrocaval lymphadenopathy and perinephric retroperitoneal nodularity. 5. Stable left and enlarging right adrenal metastases. Electronically Signed   By: Obie Dredge M.D.   On: 10/14/2022 13:11   DG Chest 2 View  Result Date: 10/05/2022 CLINICAL DATA:  Cough- urothelial cancer metastatic to lung AND hx of PE. New cough x 1 week; eval for pneumonia EXAM: CHEST - 2 VIEW COMPARISON:  Radiograph 09/07/2022 FINDINGS: Chest port catheter tip overlies the distal SVC. Unchanged  cardiomediastinal silhouette. There are diffuse nodular opacities in the lungs compatible with known metastatic disease, appears more prominent in size and number in comparison to recent radiograph. No large effusion or evidence of pneumothorax. No acute osseous abnormality. IMPRESSION: Diffuse nodular opacities consistent with known pulmonary metastatic disease, appears more prominent in size and number in comparison to recent chest radiograph in February, could reflect progression of disease or a superimposed infectious/inflammatory process. Electronically Signed   By: Caprice Renshaw M.D.   On: 10/05/2022 11:44    PERFORMANCE STATUS (ECOG) : 2 - Symptomatic, <50% confined to bed  Review of Systems Unless otherwise noted, a complete review of systems is negative.  Physical Exam General: NAD Cardiovascular: regular rate and rhythm Pulmonary: clear ant fields Abdomen: soft, nontender, + bowel sounds GU: no suprapubic tenderness Extremities: RLE edema, no joint deformities Skin: no rashes Neurological: Weakness but otherwise nonfocal  IMPRESSION/PLAN: Stage IV urothelial -status post cycle 1 second line pembrolizumab.  Now with overall decline with progressive weakness, minimal oral intake.  Labs show hypercalcemia and are suggestive of dehydration and leukocytosis.  I spoke with patient and family regarding goals.  Is becoming increasingly taxing on patient to transfer back and forth to the cancer center.  Family recognize that patient is likely rapidly nearing end-of-life and would prefer to keep him home and keep him comfortable until his passing.  They are interested in hospice involvement, which I will coordinate today.  Will proceed with ordering morphine elixir and atropine drops for management of oral secretions by his daughter who is a Therapist, music.  Patient and family both confirm DNR/DNI order signed for patient to take home.  Case and plan discussed with Dr. Orlie Dakin  Patient  expressed understanding and was in agreement with this plan. He also understands that He can call clinic at any time with any questions, concerns, or complaints.   Thank you for allowing me to participate in the care of this very pleasant patient.   Time Total: 25 minutes  Visit consisted of counseling and education dealing with the complex and emotionally intense issues of symptom management in the setting of serious illness.Greater than 50%  of this time was spent counseling and coordinating care related to the above assessment and plan.  Signed by: Laurette Schimke, PhD, NP-C

## 2022-11-02 NOTE — Telephone Encounter (Signed)
Returned phone call to [facility]. Patient was mentally drained and confused with the new start of Remeron. Pt's family d/c the Remeron to see if this helps.    Oxygen nasal cannula come off in middle of the night which was a contributing factor in the drop of oxygen sats.  The would like to work with [employer] to provide a continuous oxygen tank. They currently only have the oxygen concentrator.  Pt added to [facility] sch. At 1pm today.

## 2022-11-03 ENCOUNTER — Inpatient Hospital Stay: Payer: BLUE CROSS/BLUE SHIELD | Admitting: Oncology

## 2022-11-03 ENCOUNTER — Inpatient Hospital Stay: Payer: BLUE CROSS/BLUE SHIELD

## 2022-11-03 ENCOUNTER — Other Ambulatory Visit: Payer: Self-pay | Admitting: Hospice and Palliative Medicine

## 2022-11-03 LAB — SAMPLE TO BLOOD BANK

## 2022-11-03 MED ORDER — LORAZEPAM 0.5 MG PO TABS: 0.5000 mg | ORAL_TABLET | Freq: Three times a day (TID) | ORAL | 0 refills | Status: DC

## 2022-11-03 MED ORDER — MORPHINE SULFATE (CONCENTRATE) 10 MG /0.5 ML PO SOLN: 10.0000 mg | ORAL | 0 refills | Status: DC | PRN

## 2022-11-03 MED ORDER — HYDROCODONE-ACETAMINOPHEN 5-325 MG PO TABS: 1.0000 | ORAL_TABLET | ORAL | 0 refills | Status: DC | PRN

## 2022-11-03 NOTE — Progress Notes (Signed)
I spoke with hospice RN - Erie Noe. [facility] did not have morphine and I was requested to send new Rx to [facility]. Also sent rx for lorazepam.

## 2022-11-04 ENCOUNTER — Ambulatory Visit: Payer: BLUE CROSS/BLUE SHIELD | Admitting: Oncology

## 2022-11-04 ENCOUNTER — Ambulatory Visit: Payer: BLUE CROSS/BLUE SHIELD

## 2022-11-04 ENCOUNTER — Other Ambulatory Visit: Payer: BLUE CROSS/BLUE SHIELD

## 2022-11-05 ENCOUNTER — Ambulatory Visit: Payer: BLUE CROSS/BLUE SHIELD

## 2022-11-05 ENCOUNTER — Ambulatory Visit: Payer: BLUE CROSS/BLUE SHIELD | Admitting: Oncology

## 2022-11-05 ENCOUNTER — Other Ambulatory Visit: Payer: BLUE CROSS/BLUE SHIELD

## 2022-11-11 DEATH — deceased

## 2022-11-12 ENCOUNTER — Other Ambulatory Visit: Payer: BLUE CROSS/BLUE SHIELD

## 2022-11-12 ENCOUNTER — Ambulatory Visit: Payer: BLUE CROSS/BLUE SHIELD

## 2022-11-12 ENCOUNTER — Ambulatory Visit: Payer: BLUE CROSS/BLUE SHIELD | Admitting: Oncology

## 2022-11-16 ENCOUNTER — Ambulatory Visit: Payer: BLUE CROSS/BLUE SHIELD | Admitting: Nurse Practitioner

## 2022-11-16 ENCOUNTER — Ambulatory Visit: Payer: BLUE CROSS/BLUE SHIELD

## 2022-11-16 ENCOUNTER — Other Ambulatory Visit: Payer: BLUE CROSS/BLUE SHIELD

## 2022-12-03 ENCOUNTER — Ambulatory Visit: Payer: BLUE CROSS/BLUE SHIELD

## 2022-12-03 ENCOUNTER — Other Ambulatory Visit: Payer: BLUE CROSS/BLUE SHIELD

## 2022-12-03 ENCOUNTER — Ambulatory Visit: Payer: BLUE CROSS/BLUE SHIELD | Admitting: Oncology

## 2022-12-08 ENCOUNTER — Ambulatory Visit: Payer: BLUE CROSS/BLUE SHIELD

## 2022-12-08 ENCOUNTER — Ambulatory Visit: Payer: BLUE CROSS/BLUE SHIELD | Admitting: Oncology

## 2022-12-08 ENCOUNTER — Other Ambulatory Visit: Payer: BLUE CROSS/BLUE SHIELD

## 2022-12-29 ENCOUNTER — Ambulatory Visit: Payer: BLUE CROSS/BLUE SHIELD | Admitting: Urology

## 2023-01-19 IMAGING — CR DG CHEST 2V
2 series · 3 of 3 positions shown · non-contrast
Comparison: 03/04/2005

CLINICAL DATA: Cough for 3 weeks.  Fever for 2 days.

EXAM:
CHEST - 2 VIEW

[Series 1: chest pa · 0.14mm/px · 2 of 2 slices shown]
[im 1/2]
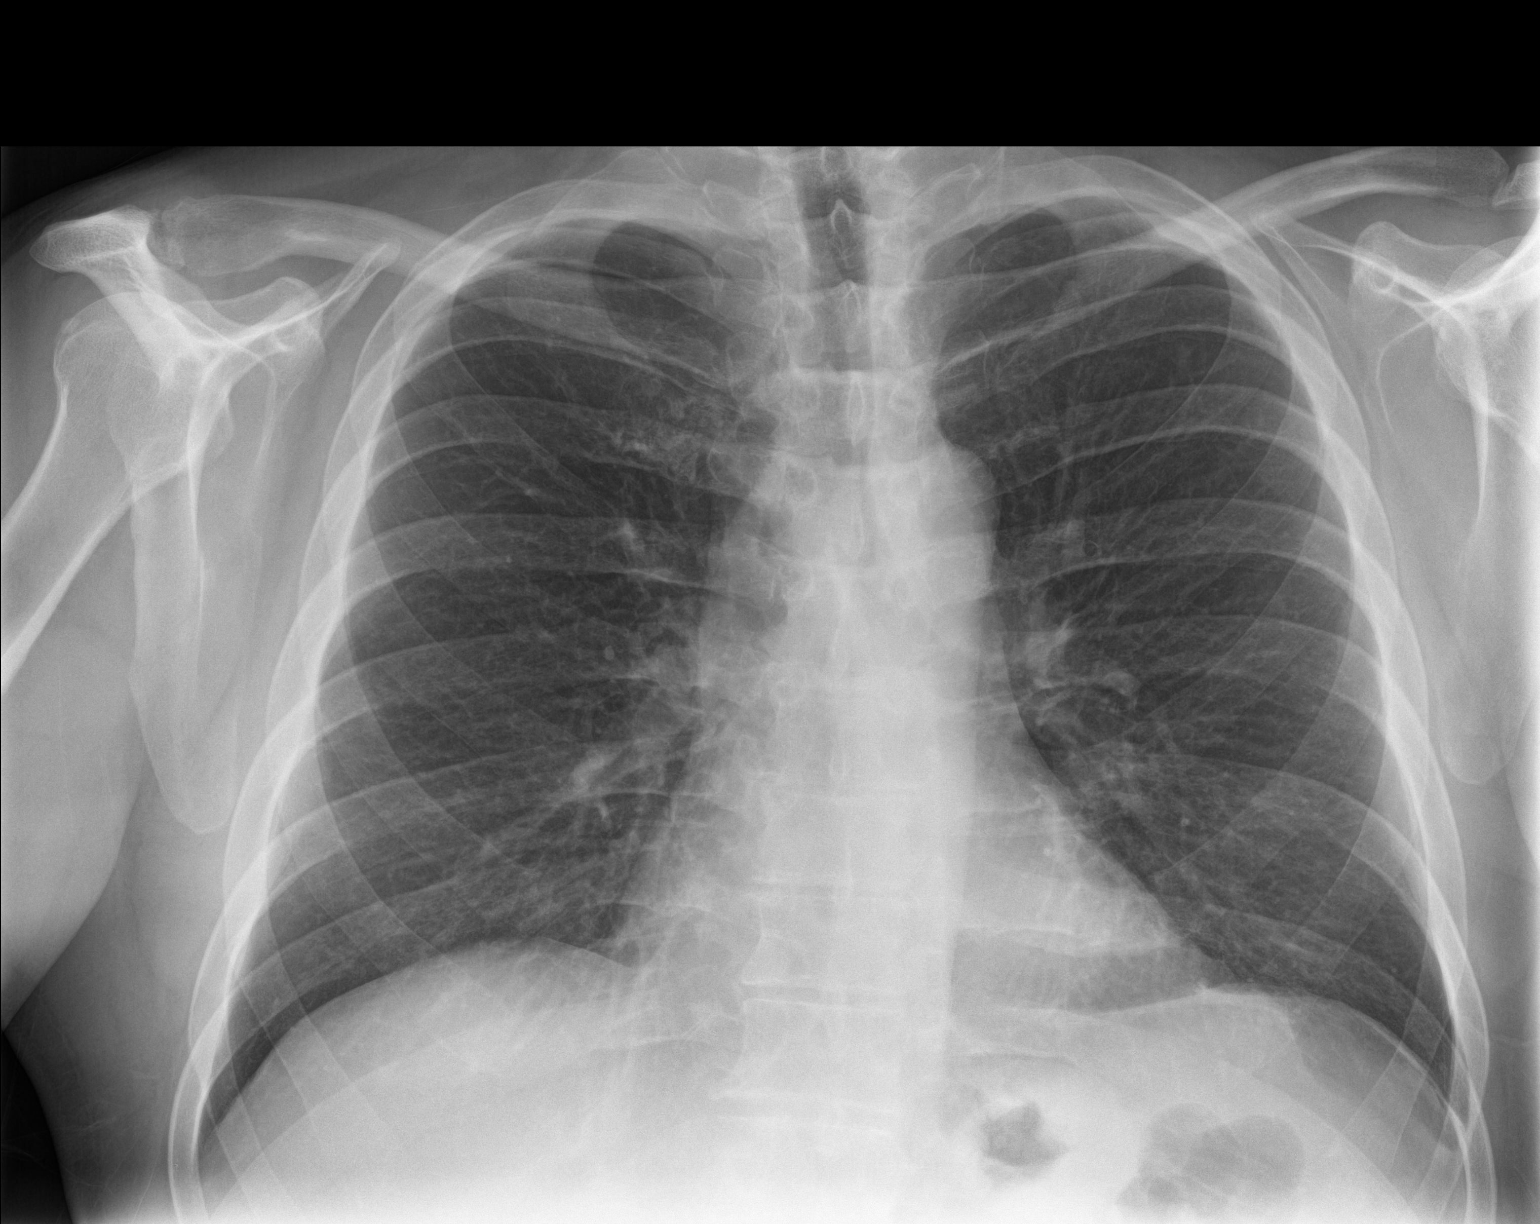
[im 2/2]
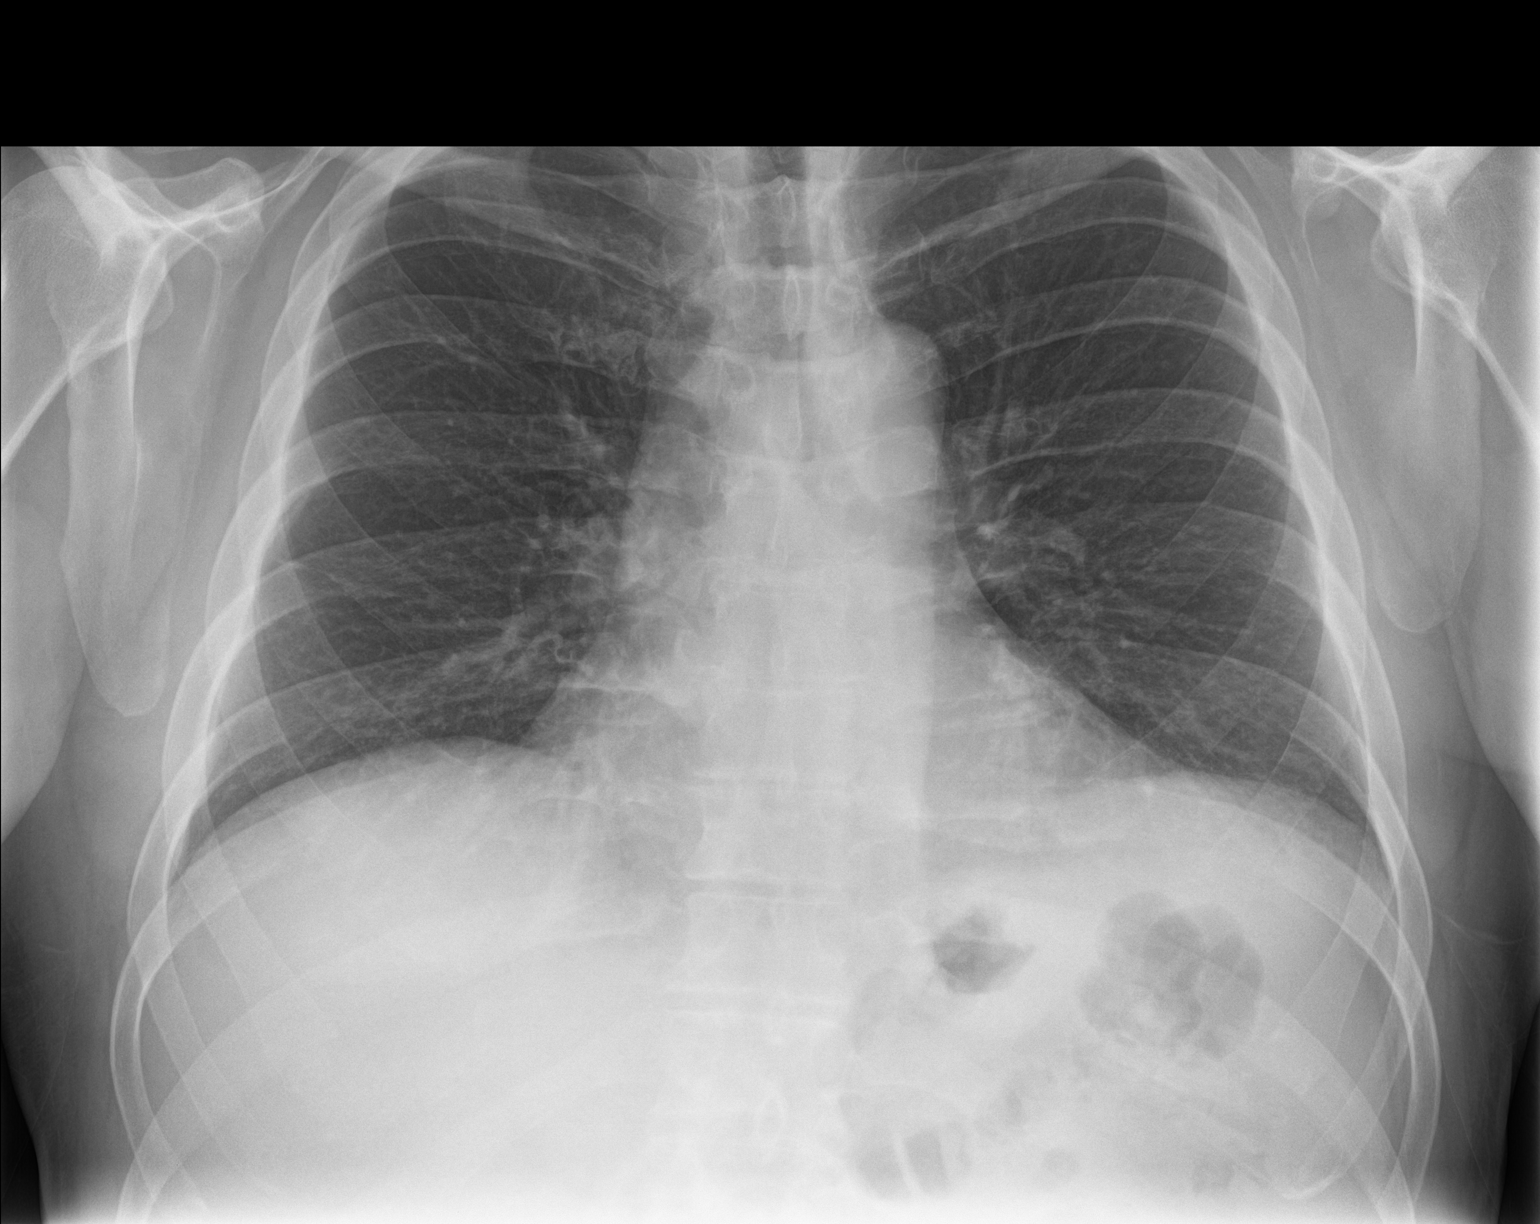

[chest lat]
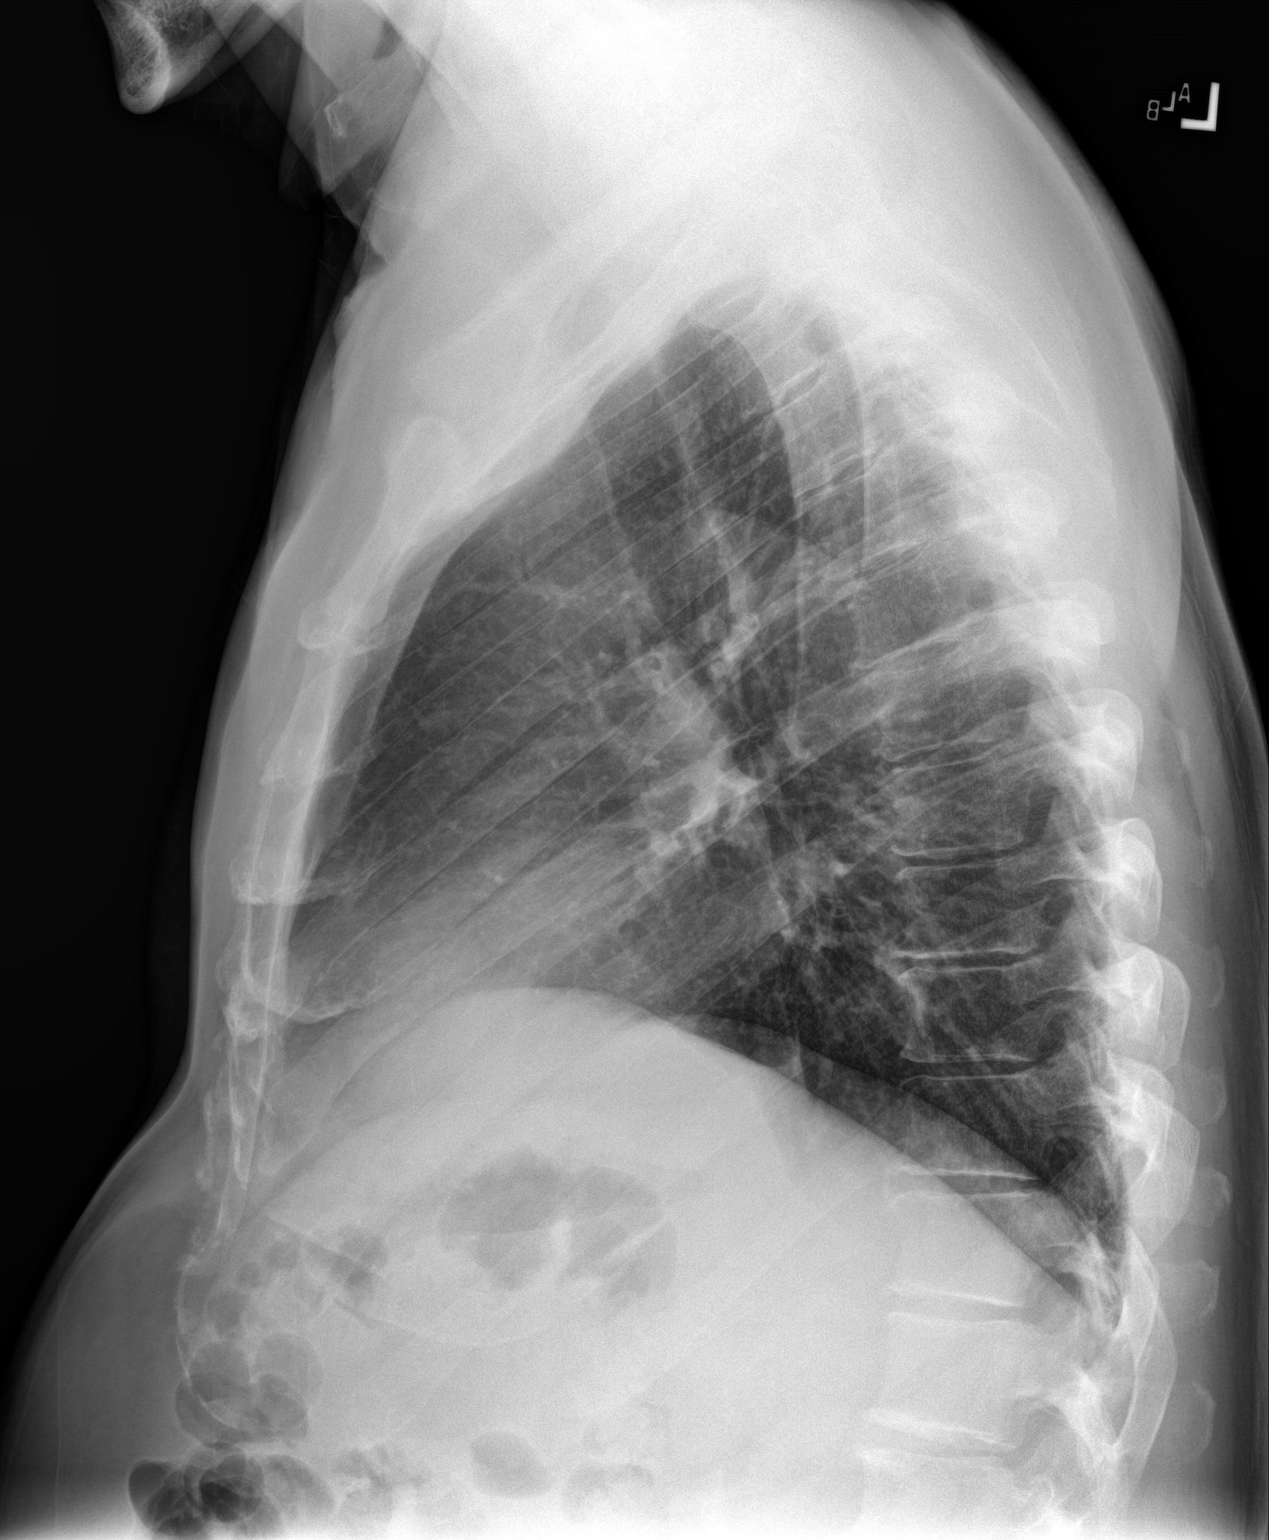

[3 of 3 positions shown; findings below may reference images not displayed]

FINDINGS: The heart size and mediastinal contours are within normal limits.
Both lungs are clear. The visualized skeletal structures are
unremarkable.
IMPRESSION: No active cardiopulmonary disease.

## 2023-01-23 IMAGING — CR DG CHEST 2V
3 series · 3 of 3 positions shown · non-contrast
Comparison: October 28, 2020

CLINICAL DATA: Worsening left lower chest pain with cough

EXAM:
CHEST - 2 VIEW

[chest pa]
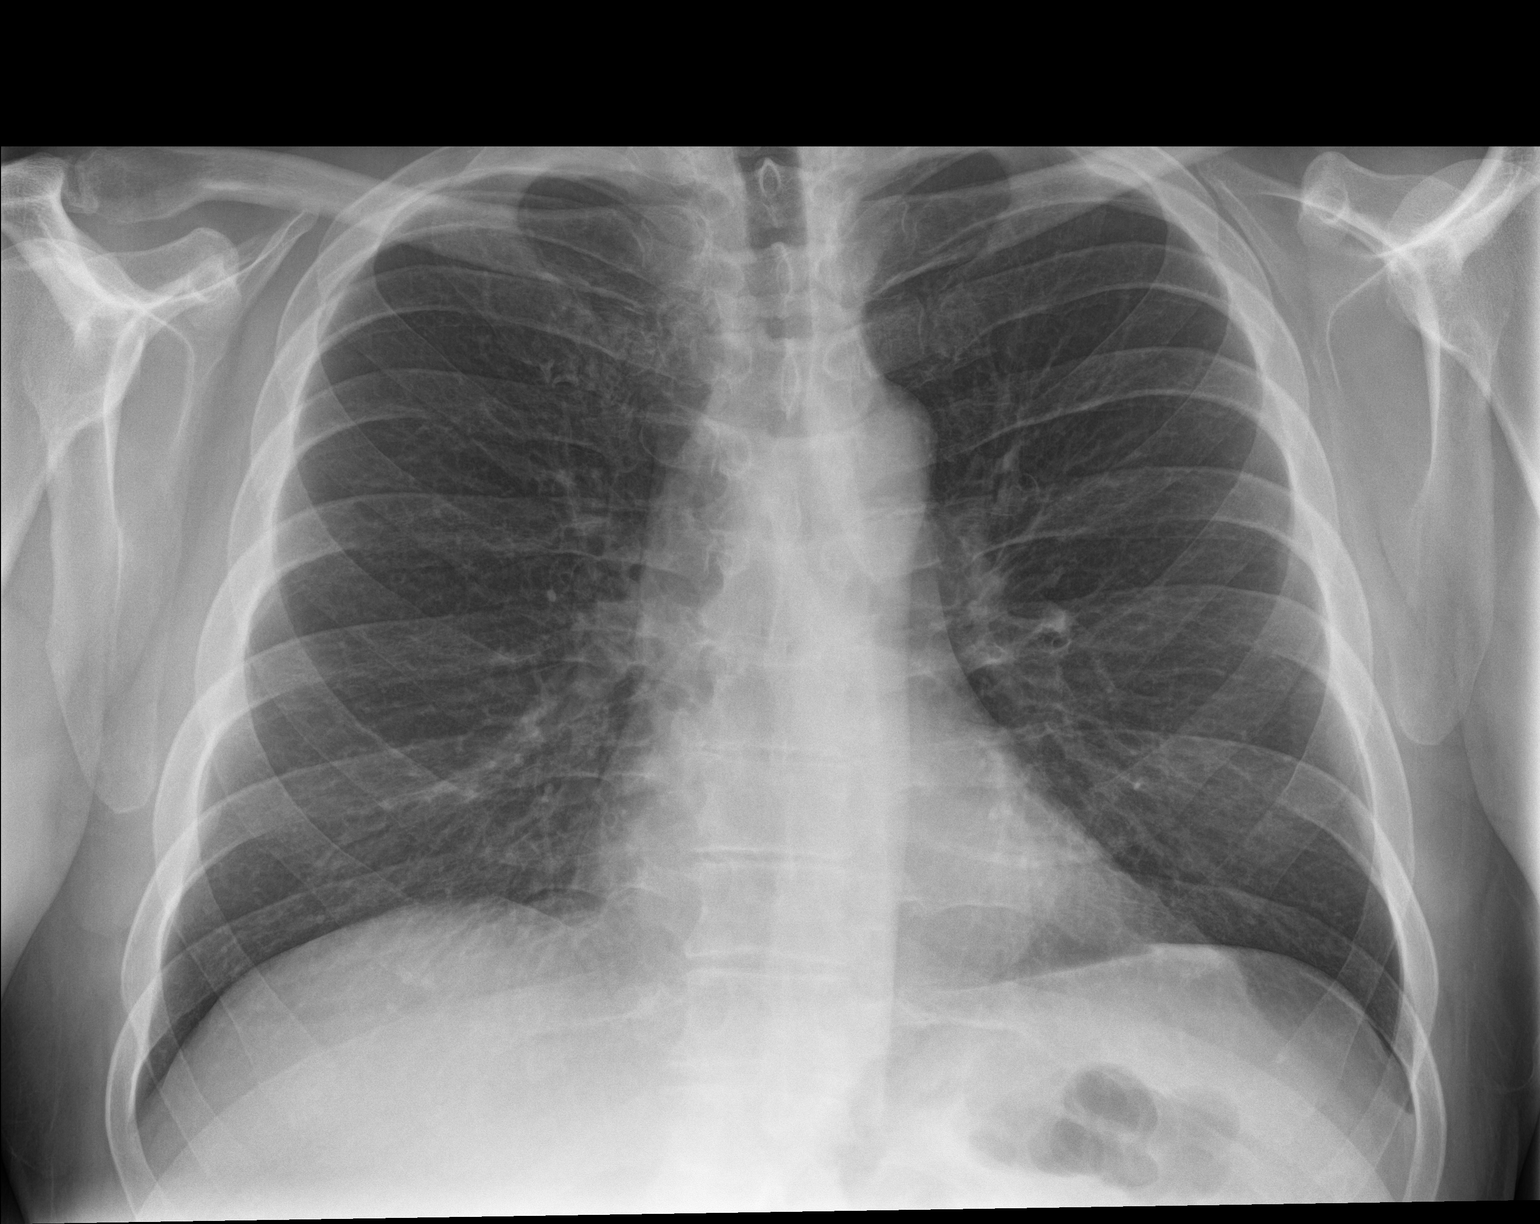

[chest lat (1 of 2)]
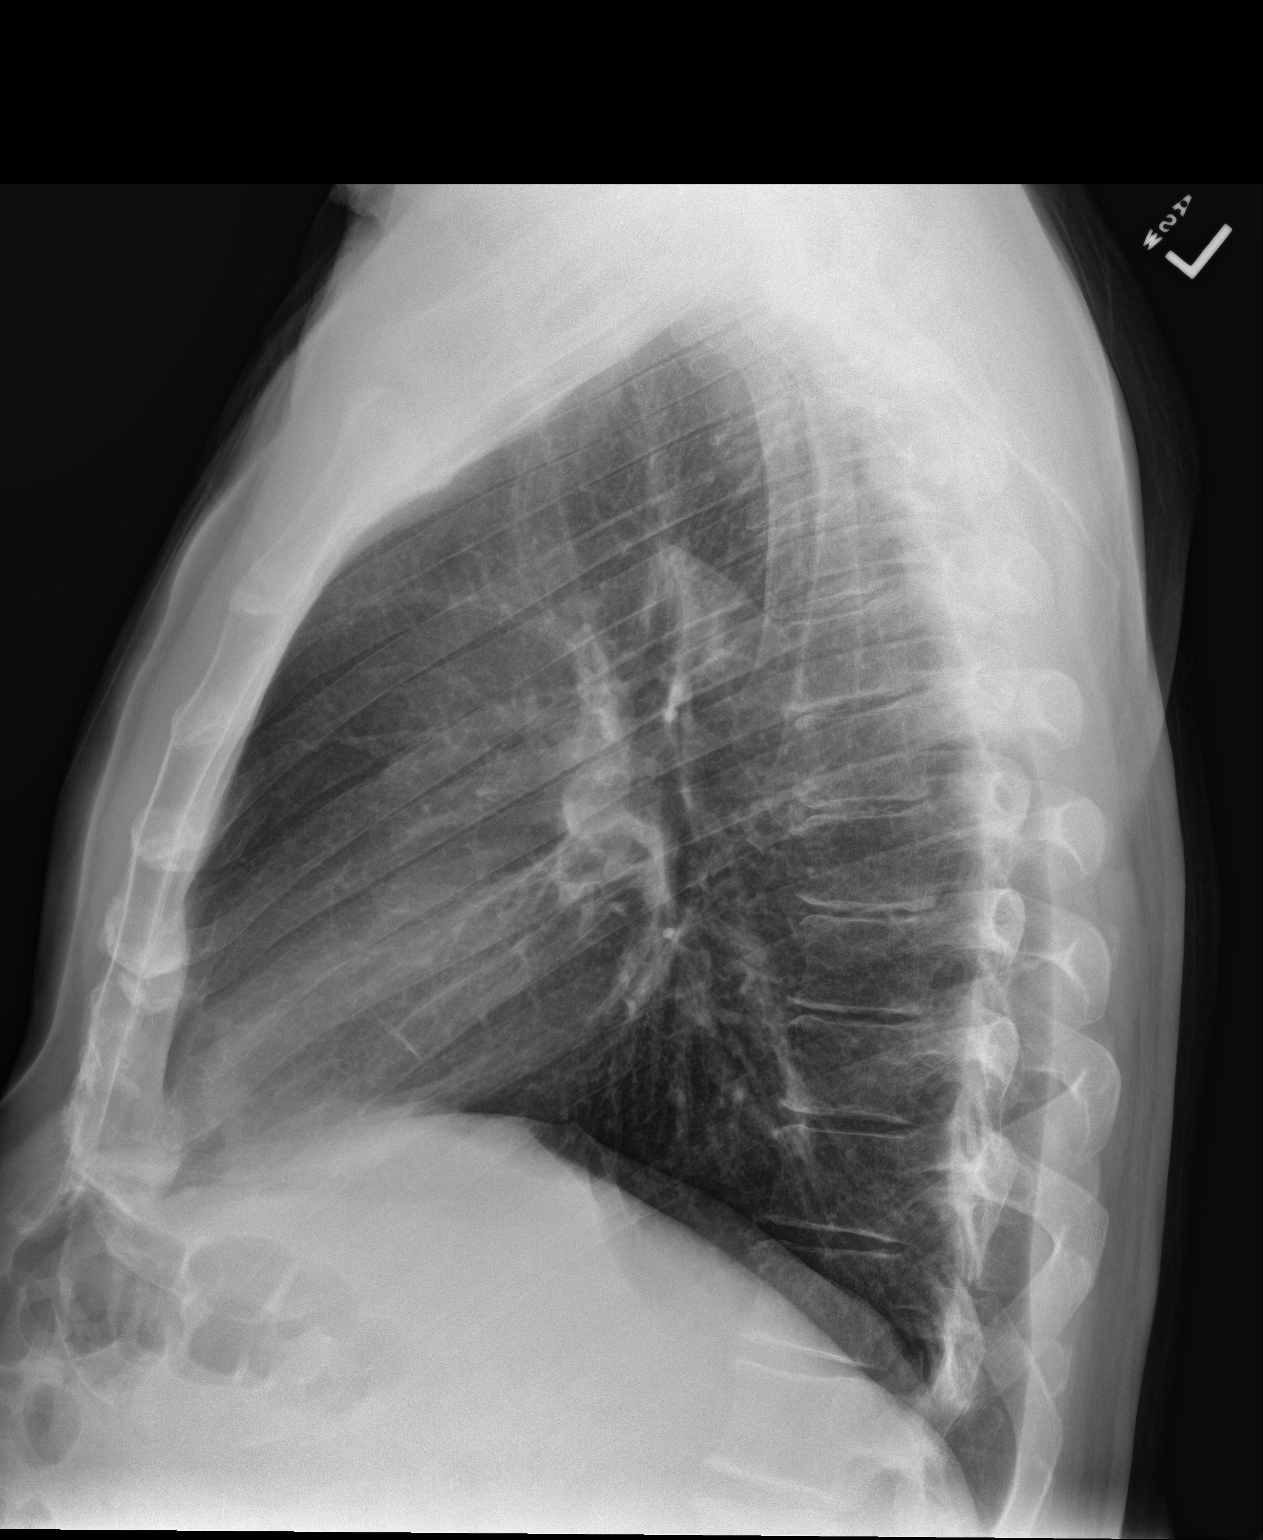

[chest lat (2 of 2)]
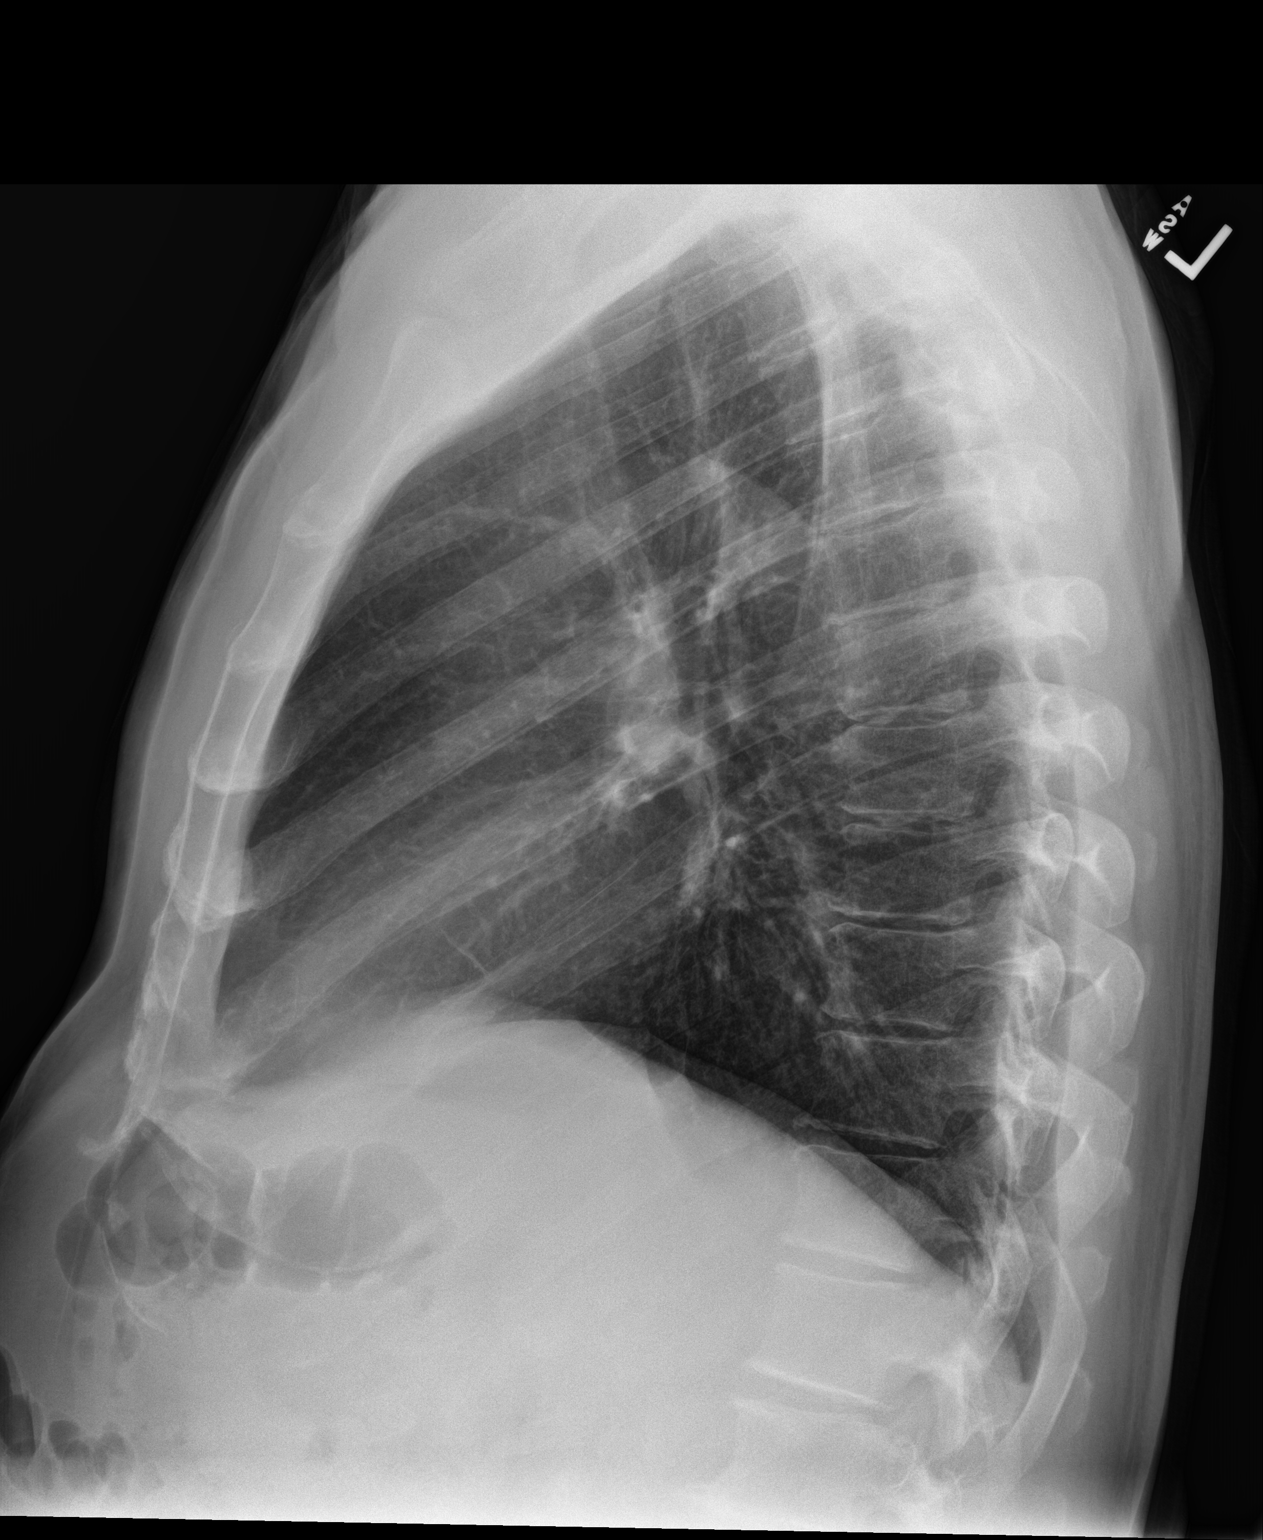

[3 of 3 positions shown; findings below may reference images not displayed]

FINDINGS: The heart size and mediastinal contours are within normal limits. No
focal consolidation. No pleural effusion. No pneumothorax. The
visualized skeletal structures are unremarkable.
IMPRESSION: No active cardiopulmonary disease.
